# Patient Record
Sex: Male | Born: 1940
Health system: Southern US, Community
[De-identification: ages and names within clinical notes are randomized; demographics above are authoritative.]

## PROBLEM LIST (undated history)

## (undated) DIAGNOSIS — E119 Type 2 diabetes mellitus without complications: Secondary | ICD-10-CM

## (undated) DIAGNOSIS — E785 Hyperlipidemia, unspecified: Secondary | ICD-10-CM

## (undated) DIAGNOSIS — I10 Essential (primary) hypertension: Secondary | ICD-10-CM

## (undated) DIAGNOSIS — I2510 Atherosclerotic heart disease of native coronary artery without angina pectoris: Secondary | ICD-10-CM

## (undated) DIAGNOSIS — M1990 Unspecified osteoarthritis, unspecified site: Secondary | ICD-10-CM

## (undated) DIAGNOSIS — K219 Gastro-esophageal reflux disease without esophagitis: Secondary | ICD-10-CM

## (undated) DIAGNOSIS — N189 Chronic kidney disease, unspecified: Secondary | ICD-10-CM

## (undated) DIAGNOSIS — T7840XA Allergy, unspecified, initial encounter: Secondary | ICD-10-CM

## (undated) HISTORY — DX: Allergy, unspecified, initial encounter: T78.40XA

## (undated) HISTORY — PX: WISDOM TOOTH EXTRACTION: SHX21

## (undated) HISTORY — DX: Essential (primary) hypertension: I10

## (undated) HISTORY — DX: Chronic kidney disease, unspecified: N18.9

## (undated) HISTORY — PX: CORONARY ARTERY BYPASS GRAFT: SHX141

## (undated) HISTORY — DX: Type 2 diabetes mellitus without complications: E11.9

## (undated) HISTORY — DX: Atherosclerotic heart disease of native coronary artery without angina pectoris: I25.10

## (undated) HISTORY — DX: Hyperlipidemia, unspecified: E78.5

---

## 2002-09-03 HISTORY — PX: BOWEL RESECTION: SHX1257

## 2009-09-03 HISTORY — PX: FINGER AMPUTATION: SHX636

## 2010-06-17 ENCOUNTER — Emergency Department: Payer: Self-pay | Admitting: Unknown Physician Specialty

## 2010-06-28 ENCOUNTER — Ambulatory Visit: Payer: Self-pay | Admitting: General Practice

## 2010-06-30 ENCOUNTER — Ambulatory Visit: Payer: Self-pay | Admitting: General Practice

## 2010-07-03 LAB — PATHOLOGY REPORT

## 2010-07-18 ENCOUNTER — Emergency Department: Payer: Self-pay | Admitting: Emergency Medicine

## 2010-11-06 ENCOUNTER — Ambulatory Visit: Payer: Self-pay | Admitting: Family Medicine

## 2012-06-19 ENCOUNTER — Ambulatory Visit: Payer: Self-pay | Admitting: Family Medicine

## 2012-12-11 DIAGNOSIS — S8263XA Displaced fracture of lateral malleolus of unspecified fibula, initial encounter for closed fracture: Secondary | ICD-10-CM | POA: Insufficient documentation

## 2012-12-17 DIAGNOSIS — H5034 Intermittent alternating exotropia: Secondary | ICD-10-CM | POA: Insufficient documentation

## 2012-12-17 DIAGNOSIS — D3130 Benign neoplasm of unspecified choroid: Secondary | ICD-10-CM | POA: Insufficient documentation

## 2012-12-17 DIAGNOSIS — H40003 Preglaucoma, unspecified, bilateral: Secondary | ICD-10-CM | POA: Insufficient documentation

## 2012-12-17 DIAGNOSIS — S0232XA Fracture of orbital floor, left side, initial encounter for closed fracture: Secondary | ICD-10-CM | POA: Insufficient documentation

## 2012-12-17 DIAGNOSIS — S0230XA Fracture of orbital floor, unspecified side, initial encounter for closed fracture: Secondary | ICD-10-CM | POA: Insufficient documentation

## 2012-12-17 DIAGNOSIS — H40009 Preglaucoma, unspecified, unspecified eye: Secondary | ICD-10-CM | POA: Insufficient documentation

## 2012-12-23 DIAGNOSIS — S2249XA Multiple fractures of ribs, unspecified side, initial encounter for closed fracture: Secondary | ICD-10-CM | POA: Insufficient documentation

## 2013-08-21 ENCOUNTER — Ambulatory Visit: Payer: Self-pay | Admitting: Gastroenterology

## 2014-03-04 DIAGNOSIS — R011 Cardiac murmur, unspecified: Secondary | ICD-10-CM | POA: Insufficient documentation

## 2014-03-04 DIAGNOSIS — N189 Chronic kidney disease, unspecified: Secondary | ICD-10-CM | POA: Insufficient documentation

## 2014-10-07 DIAGNOSIS — E876 Hypokalemia: Secondary | ICD-10-CM | POA: Diagnosis not present

## 2014-10-07 DIAGNOSIS — R600 Localized edema: Secondary | ICD-10-CM | POA: Diagnosis not present

## 2014-10-07 DIAGNOSIS — N183 Chronic kidney disease, stage 3 (moderate): Secondary | ICD-10-CM | POA: Diagnosis not present

## 2014-10-07 DIAGNOSIS — I10 Essential (primary) hypertension: Secondary | ICD-10-CM | POA: Diagnosis not present

## 2015-01-12 DIAGNOSIS — H524 Presbyopia: Secondary | ICD-10-CM | POA: Diagnosis not present

## 2015-01-12 DIAGNOSIS — H5210 Myopia, unspecified eye: Secondary | ICD-10-CM | POA: Diagnosis not present

## 2015-02-04 ENCOUNTER — Ambulatory Visit (INDEPENDENT_AMBULATORY_CARE_PROVIDER_SITE_OTHER): Payer: Commercial Managed Care - HMO | Admitting: Unknown Physician Specialty

## 2015-02-04 ENCOUNTER — Encounter: Payer: Self-pay | Admitting: Unknown Physician Specialty

## 2015-02-04 VITALS — BP 126/78 | HR 71 | Temp 98.4°F | Wt 206.0 lb

## 2015-02-04 DIAGNOSIS — E782 Mixed hyperlipidemia: Secondary | ICD-10-CM | POA: Diagnosis not present

## 2015-02-04 DIAGNOSIS — I10 Essential (primary) hypertension: Secondary | ICD-10-CM | POA: Diagnosis not present

## 2015-02-04 DIAGNOSIS — R05 Cough: Secondary | ICD-10-CM

## 2015-02-04 DIAGNOSIS — R059 Cough, unspecified: Secondary | ICD-10-CM

## 2015-02-04 DIAGNOSIS — J309 Allergic rhinitis, unspecified: Secondary | ICD-10-CM

## 2015-02-04 DIAGNOSIS — I2510 Atherosclerotic heart disease of native coronary artery without angina pectoris: Secondary | ICD-10-CM | POA: Diagnosis not present

## 2015-02-04 MED ORDER — BENZONATATE 100 MG PO CAPS: 100.0000 mg | ORAL_CAPSULE | Freq: Two times a day (BID) | ORAL | Status: DC | PRN

## 2015-02-04 MED ORDER — FLUTICASONE PROPIONATE 50 MCG/ACT NA SUSP: 2.0000 | Freq: Every day | NASAL | Status: DC

## 2015-02-04 NOTE — Progress Notes (Signed)
Name: Ryan Kirby   MRN: [id number]    DOB: 31-Oct-1940   Date:02/04/2015       Progress Note  Subjective  Chief Complaint  Chief Complaint  Patient presents with  . Cough    Making chest hurt  . URI  . Sinus drainage   Symptoms have been going on for about a day.  Starting after mowing the lawn and "running the weed eater" Taking Coriciden C. Cough This is a new problem. The current episode started yesterday. The problem has been unchanged. The problem occurs constantly. The cough is non-productive. Associated symptoms include chest pain, nasal congestion and rhinorrhea. Pertinent negatives include no ear pain, fever or shortness of breath. Nothing aggravates the symptoms. The treatment provided no relief.  URI  Associated symptoms include chest pain, coughing and rhinorrhea. Pertinent negatives include no ear pain.     No problem-specific assessment & plan notes found for this encounter.   Past Medical History  Diagnosis Date  . Diabetes mellitus without complication   . Hypertension   . Hyperlipidemia   . Chronic kidney disease   . Allergy   . CAD (coronary artery disease)     History  Substance Use Topics  . Smoking status: Never Smoker   . Smokeless tobacco: Never Used  . Alcohol Use: No     Current outpatient prescriptions:  .  amLODipine (NORVASC) 10 MG tablet, Take 10 mg by mouth daily., Disp: , Rfl:  .  benazepril-hydrochlorthiazide (LOTENSIN HCT) 20-12.5 MG per tablet, Take 1 tablet by mouth daily., Disp: , Rfl:  .  benzonatate (TESSALON) 100 MG capsule, Take 1 capsule (100 mg total) by mouth 2 (two) times daily as needed for cough., Disp: 20 capsule, Rfl: 0 .  carvedilol (COREG) 25 MG tablet, Take 50 mg by mouth 2 (two) times daily with a meal., Disp: , Rfl:  .  cloNIDine (CATAPRES) 0.3 MG tablet, Take 0.3 mg by mouth 2 (two) times daily., Disp: , Rfl:  .  doxazosin (CARDURA) 2 MG tablet, Take 2 mg by mouth at bedtime., Disp: , Rfl:  .  fluticasone (FLONASE)  50 MCG/ACT nasal spray, Place 2 sprays into both nostrils daily., Disp: 16 g, Rfl: 6 .  furosemide (LASIX) 40 MG tablet, Take 40 mg by mouth., Disp: , Rfl:  .  glyBURIDE (DIABETA) 5 MG tablet, Take 5 mg by mouth daily with breakfast., Disp: , Rfl:  .  metFORMIN (GLUCOPHAGE) 500 MG tablet, Take 500 mg by mouth 2 (two) times daily with a meal., Disp: , Rfl:  .  potassium chloride (KLOR-CON) 20 MEQ packet, Take by mouth 2 (two) times daily., Disp: , Rfl:  .  potassium chloride SA (K-DUR,KLOR-CON) 20 MEQ tablet, Take 20 mEq by mouth daily., Disp: , Rfl:  .  pravastatin (PRAVACHOL) 40 MG tablet, Take 40 mg by mouth daily., Disp: , Rfl:  .  triamcinolone (NASACORT) 55 MCG/ACT AERO nasal inhaler, Place 2 sprays into the nose daily., Disp: , Rfl:   Allergies  Allergen Reactions  . Oxycontin [Oxycodone]     Review of Systems  Constitutional: Negative for fever.  HENT: Positive for rhinorrhea. Negative for ear pain.   Respiratory: Positive for cough. Negative for shortness of breath.   Cardiovascular: Positive for chest pain.      Objective  Filed Vitals:   02/04/15 1026  BP: 126/78  Pulse: 71  Temp: 98.4 F (36.9 C)  TempSrc: Oral  Weight: 206 lb (93.441 kg)  SpO2: 97%  Physical Exam  Constitutional: He is well-developed, well-nourished, and in no distress.  HENT:  Head: Atraumatic.  Right Ear: Tympanic membrane, external ear and ear canal normal.  Left Ear: Tympanic membrane, external ear and ear canal normal.  Nose: Mucosal edema, rhinorrhea and sinus tenderness present. No nasal deformity or septal deviation. Right sinus exhibits no maxillary sinus tenderness and no frontal sinus tenderness. Left sinus exhibits no maxillary sinus tenderness and no frontal sinus tenderness.  Neck: Normal range of motion.  Cardiovascular: Normal rate and regular rhythm.   Pulmonary/Chest: Effort normal and breath sounds normal.  Psychiatric: Mood, affect and judgment normal.        No  results found for this or any previous visit (from the past 2160 hour(s)).   Assessment & Plan  Problem List Items Addressed This Visit    None    Visit Diagnoses    Allergic rhinitis, unspecified allergic rhinitis type    -  Primary    Will rx flonase 2 squirts each nostril QD.      Relevant Medications    fluticasone (FLONASE) 50 MCG/ACT nasal spray    Cough        Tessalon Perles for cough    Relevant Medications    benzonatate (TESSALON) 100 MG capsule

## 2015-02-04 NOTE — Patient Instructions (Addendum)
Allergic Rhinitis Allergic rhinitis is when the mucous membranes in the nose respond to allergens. Allergens are particles in the air that cause your body to have an allergic reaction. This causes you to release allergic antibodies. Through a chain of events, these eventually cause you to release histamine into the blood stream. Although meant to protect the body, it is this release of histamine that causes your discomfort, such as frequent sneezing, congestion, and an itchy, runny nose.  CAUSES  Seasonal allergic rhinitis (hay fever) is caused by pollen allergens that may come from grasses, trees, and weeds. Year-round allergic rhinitis (perennial allergic rhinitis) is caused by allergens such as house dust mites, pet dander, and mold spores.  SYMPTOMS   Nasal stuffiness (congestion).  Itchy, runny nose with sneezing and tearing of the eyes. DIAGNOSIS  Your health care provider can help you determine the allergen or allergens that trigger your symptoms. If you and your health care provider are unable to determine the allergen, skin or blood testing may be used. TREATMENT  Allergic rhinitis does not have a cure, but it can be controlled by:  Medicines and allergy shots (immunotherapy).  Avoiding the allergen. Hay fever may often be treated with antihistamines in pill or nasal spray forms. Antihistamines block the effects of histamine. There are over-the-counter medicines that may help with nasal congestion and swelling around the eyes. Check with your health care provider before taking or giving this medicine.  If avoiding the allergen or the medicine prescribed do not work, there are many new medicines your health care provider can prescribe. Stronger medicine may be used if initial measures are ineffective. Desensitizing injections can be used if medicine and avoidance does not work. Desensitization is when a patient is given ongoing shots until the body becomes less sensitive to the allergen.  Make sure you follow up with your health care provider if problems continue. HOME CARE INSTRUCTIONS It is not possible to completely avoid allergens, but you can reduce your symptoms by taking steps to limit your exposure to them. It helps to know exactly what you are allergic to so that you can avoid your specific triggers. SEEK MEDICAL CARE IF:   You have a fever.  You develop a cough that does not stop easily (persistent).  You have shortness of breath.  You start wheezing.  Symptoms interfere with normal daily activities. Document Released: 05/15/2001 Document Revised: 08/25/2013 Document Reviewed: 04/27/2013 [facility] Patient Information 2015 [place], [place]. This information is not intended to replace advice given to you by your health care provider. Make sure you discuss any questions you have with your health care provider.   May use over the counter Allegra, Zyrtec or Clariton in additional to the nasal spray

## 2015-02-07 DIAGNOSIS — I2510 Atherosclerotic heart disease of native coronary artery without angina pectoris: Secondary | ICD-10-CM | POA: Diagnosis not present

## 2015-02-16 ENCOUNTER — Telehealth: Payer: Self-pay | Admitting: Family Medicine

## 2015-02-16 NOTE — Telephone Encounter (Signed)
Paperwork up front.  Patient not on insulin, no letter needed, and Cardiology must take care of other requirements

## 2015-02-16 NOTE — Telephone Encounter (Signed)
Pt came in and had a paper about diabetes mellitus and cardiovascular disease and would like a call back. He has been here twice and says that nobody calls him back and so he stopped by again. i have a copy of the paper up front but was unsure how to scan it in so that you could see it. Pt would like a call back at [phone].

## 2015-02-18 ENCOUNTER — Telehealth: Payer: Self-pay | Admitting: Family Medicine

## 2015-02-18 DIAGNOSIS — E876 Hypokalemia: Secondary | ICD-10-CM | POA: Diagnosis not present

## 2015-02-18 DIAGNOSIS — R600 Localized edema: Secondary | ICD-10-CM | POA: Diagnosis not present

## 2015-02-18 DIAGNOSIS — D631 Anemia in chronic kidney disease: Secondary | ICD-10-CM | POA: Diagnosis not present

## 2015-02-18 DIAGNOSIS — I10 Essential (primary) hypertension: Secondary | ICD-10-CM | POA: Diagnosis not present

## 2015-02-18 DIAGNOSIS — N182 Chronic kidney disease, stage 2 (mild): Secondary | ICD-10-CM | POA: Diagnosis not present

## 2015-02-18 DIAGNOSIS — N183 Chronic kidney disease, stage 3 (moderate): Secondary | ICD-10-CM | POA: Diagnosis not present

## 2015-02-18 NOTE — Telephone Encounter (Signed)
Pt came by again today insisting that he needs things from [place] in order for him to be able to drive. i spoke with him yesterday and explained that nancy said that he would need to talk to his cardiovascular doctor in order to get what he needed and he has came in today and now says that he need his physician to verify that he hasnt had any severe hypoglycemic reactions in the last 12 months as well as the date of his last a1c level which must be within the last 6 months. He would like a call back at [phone]

## 2015-02-21 ENCOUNTER — Other Ambulatory Visit: Payer: Self-pay | Admitting: Family Medicine

## 2015-02-22 NOTE — Telephone Encounter (Signed)
Can you write a letter with this info?

## 2015-02-22 NOTE — Telephone Encounter (Signed)
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

## 2015-02-23 ENCOUNTER — Encounter: Payer: Self-pay | Admitting: Family Medicine

## 2015-02-23 ENCOUNTER — Ambulatory Visit (INDEPENDENT_AMBULATORY_CARE_PROVIDER_SITE_OTHER): Payer: Commercial Managed Care - HMO | Admitting: Family Medicine

## 2015-02-23 VITALS — BP 177/79 | HR 75 | Temp 98.4°F | Ht 69.8 in | Wt 205.0 lb

## 2015-02-23 DIAGNOSIS — E1159 Type 2 diabetes mellitus with other circulatory complications: Secondary | ICD-10-CM | POA: Insufficient documentation

## 2015-02-23 DIAGNOSIS — I2510 Atherosclerotic heart disease of native coronary artery without angina pectoris: Secondary | ICD-10-CM | POA: Diagnosis not present

## 2015-02-23 DIAGNOSIS — E785 Hyperlipidemia, unspecified: Secondary | ICD-10-CM

## 2015-02-23 DIAGNOSIS — I129 Hypertensive chronic kidney disease with stage 1 through stage 4 chronic kidney disease, or unspecified chronic kidney disease: Secondary | ICD-10-CM | POA: Insufficient documentation

## 2015-02-23 DIAGNOSIS — E119 Type 2 diabetes mellitus without complications: Secondary | ICD-10-CM | POA: Diagnosis not present

## 2015-02-23 DIAGNOSIS — I2583 Coronary atherosclerosis due to lipid rich plaque: Secondary | ICD-10-CM

## 2015-02-23 DIAGNOSIS — I10 Essential (primary) hypertension: Secondary | ICD-10-CM | POA: Diagnosis not present

## 2015-02-23 DIAGNOSIS — I152 Hypertension secondary to endocrine disorders: Secondary | ICD-10-CM | POA: Insufficient documentation

## 2015-02-23 DIAGNOSIS — E1169 Type 2 diabetes mellitus with other specified complication: Secondary | ICD-10-CM | POA: Insufficient documentation

## 2015-02-23 NOTE — Assessment & Plan Note (Addendum)
Pt no cardiac sx doing well Good report from cardiology no concerns

## 2015-02-23 NOTE — Assessment & Plan Note (Signed)
The current medical regimen is effective;  continue present plan and medications.  

## 2015-02-23 NOTE — Assessment & Plan Note (Signed)
HgbA1C 7.5 today  No hypoglycemia spells Pt will do better with diet and exercise for a little better control

## 2015-02-23 NOTE — Progress Notes (Signed)
   BP 177/79 mmHg  Pulse 75  Temp(Src) 98.4 F (36.9 C)  Ht 5' 9.8" (1.773 m)  Wt 205 lb (92.987 kg)  BMI 29.58 kg/m2  SpO2 97%   Subjective:    Patient ID: Ryan Kirby, male    DOB: 05/28/1941, 74 y.o.   MRN: [id number]  HPI: Ryan Kirby is a 74 y.o. male  Chief Complaint  Patient presents with  . Diabetes  . Hyperlipidemia  . Hypertension   pt doing well Taking meds every day with no prob;lems No problems from last visit No hypoglycemia problems  Relevant past medical, surgical, family and social history reviewed and updated as indicated. Interim medical history since our last visit reviewed. Allergies and medications reviewed and updated.  Review of Systems  Constitutional: Negative.   Respiratory: Negative.   Cardiovascular: Negative.     Per HPI unless specifically indicated above     Objective:    BP 177/79 mmHg  Pulse 75  Temp(Src) 98.4 F (36.9 C)  Ht 5' 9.8" (1.773 m)  Wt 205 lb (92.987 kg)  BMI 29.58 kg/m2  SpO2 97%  Wt Readings from Last 3 Encounters:  02/23/15 205 lb (92.987 kg)  07/15/14 207 lb (93.895 kg)  02/04/15 206 lb (93.441 kg)    Physical Exam  Constitutional: He is oriented to person, place, and time. He appears well-developed and well-nourished. No distress.  HENT:  Head: Normocephalic and atraumatic.  Right Ear: Hearing normal.  Left Ear: Hearing normal.  Nose: Nose normal.  Eyes: Conjunctivae and lids are normal. Right eye exhibits no discharge. Left eye exhibits no discharge. No scleral icterus.  Cardiovascular: Normal rate, regular rhythm and normal heart sounds.   Pulmonary/Chest: Effort normal and breath sounds normal. No respiratory distress.  Musculoskeletal: Normal range of motion.  Neurological: He is alert and oriented to person, place, and time.  Skin: Skin is intact. No rash noted.  Psychiatric: He has a normal mood and affect. His speech is normal and behavior is normal. Judgment and thought content normal.  Cognition and memory are normal.        Assessment & Plan:   Problem List Items Addressed This Visit      Cardiovascular and Mediastinum   Hypertension - Primary    The current medical regimen is effective;  continue present plan and medications.       Relevant Medications   potassium chloride SA (K-DUR,KLOR-CON) 20 MEQ tablet   Other Relevant Orders   Basic metabolic panel   CAD (coronary artery disease)    Pt no cardiac sx doing well Good report from cardiology no concerns      Relevant Orders   Basic metabolic panel   Lipid panel     Endocrine   Diabetes mellitus without complication    [id number] 7.5 today  No hypoglycemia spells Pt will do better with diet and exercise for a little better control      Relevant Orders   Basic metabolic panel   Lipid panel   Hemoglobin A1c     Other   Hyperlipidemia    The current medical regimen is effective;  continue present plan and medications.       Relevant Orders   AST   ALT   Lipid panel       Follow up plan: Return in about 3 months (around 05/26/2015) for DM check a1c.

## 2015-02-24 LAB — ALT: ALT: 12 IU/L (ref 0–44)

## 2015-02-24 LAB — BASIC METABOLIC PANEL
BUN/Creatinine Ratio: 14 (ref 10–22)
BUN: 18 mg/dL (ref 8–27)
CALCIUM: 10.1 mg/dL (ref 8.6–10.2)
CO2: 23 mmol/L (ref 18–29)
CREATININE: 1.33 mg/dL — AB (ref 0.76–1.27)
Chloride: 99 mmol/L (ref 97–108)
GFR, EST AFRICAN AMERICAN: 61 mL/min/{1.73_m2} (ref >59)
GFR, EST NON AFRICAN AMERICAN: 53 mL/min/{1.73_m2} — AB (ref >59)
Glucose: 99 mg/dL (ref 65–99)
POTASSIUM: 3.7 mmol/L (ref 3.5–5.2)
Sodium: 139 mmol/L (ref 134–144)

## 2015-02-24 LAB — LIPID PANEL
CHOL/HDL RATIO: 3.4 ratio (ref 0.0–5.0)
Cholesterol, Total: 160 mg/dL (ref 100–199)
HDL: 47 mg/dL (ref >39)
LDL Calculated: 86 mg/dL (ref 0–99)
Triglycerides: 135 mg/dL (ref 0–149)
VLDL Cholesterol Cal: 27 mg/dL (ref 5–40)

## 2015-02-24 LAB — LIPID PANEL PICCOLO, WAIVED
Chol/HDL Ratio Piccolo,Waive: 3.2 mg/dL
Cholesterol Piccolo, Waived: 156 mg/dL (ref <200)
HDL Chol Piccolo, Waived: 48 mg/dL — ABNORMAL LOW (ref >59)
LDL Chol Calc Piccolo Waived: 78 mg/dL (ref <100)
Triglycerides Piccolo,Waived: 146 mg/dL (ref <150)
VLDL CHOL CALC PICCOLO,WAIVE: 29 mg/dL (ref <30)

## 2015-02-24 LAB — AST: AST: 23 IU/L (ref 0–40)

## 2015-02-24 LAB — ALT (SGPT) PICCOLO, WAIVED: ALT (SGPT) Piccolo, Waived: 19 U/L (ref 10–47)

## 2015-02-24 LAB — BAYER DCA HB A1C WAIVED: HB A1C: 7.5 % — AB (ref <7.0)

## 2015-02-24 LAB — AST (SGOT) PICCOLO, WAIVED: AST (SGOT) PICCOLO, WAIVED: 28 U/L (ref 11–38)

## 2015-03-21 DIAGNOSIS — M5431 Sciatica, right side: Secondary | ICD-10-CM | POA: Diagnosis not present

## 2015-03-21 DIAGNOSIS — M9903 Segmental and somatic dysfunction of lumbar region: Secondary | ICD-10-CM | POA: Diagnosis not present

## 2015-03-21 DIAGNOSIS — M9905 Segmental and somatic dysfunction of pelvic region: Secondary | ICD-10-CM | POA: Diagnosis not present

## 2015-03-21 DIAGNOSIS — M955 Acquired deformity of pelvis: Secondary | ICD-10-CM | POA: Diagnosis not present

## 2015-03-22 DIAGNOSIS — M5431 Sciatica, right side: Secondary | ICD-10-CM | POA: Diagnosis not present

## 2015-03-22 DIAGNOSIS — M955 Acquired deformity of pelvis: Secondary | ICD-10-CM | POA: Diagnosis not present

## 2015-03-22 DIAGNOSIS — M9905 Segmental and somatic dysfunction of pelvic region: Secondary | ICD-10-CM | POA: Diagnosis not present

## 2015-03-22 DIAGNOSIS — M9903 Segmental and somatic dysfunction of lumbar region: Secondary | ICD-10-CM | POA: Diagnosis not present

## 2015-03-23 DIAGNOSIS — M5431 Sciatica, right side: Secondary | ICD-10-CM | POA: Diagnosis not present

## 2015-03-23 DIAGNOSIS — M955 Acquired deformity of pelvis: Secondary | ICD-10-CM | POA: Diagnosis not present

## 2015-03-23 DIAGNOSIS — M9903 Segmental and somatic dysfunction of lumbar region: Secondary | ICD-10-CM | POA: Diagnosis not present

## 2015-03-23 DIAGNOSIS — M9905 Segmental and somatic dysfunction of pelvic region: Secondary | ICD-10-CM | POA: Diagnosis not present

## 2015-03-25 ENCOUNTER — Telehealth: Payer: Self-pay | Admitting: Family Medicine

## 2015-03-25 DIAGNOSIS — M5431 Sciatica, right side: Secondary | ICD-10-CM | POA: Diagnosis not present

## 2015-03-25 DIAGNOSIS — M9903 Segmental and somatic dysfunction of lumbar region: Secondary | ICD-10-CM | POA: Diagnosis not present

## 2015-03-25 DIAGNOSIS — M9905 Segmental and somatic dysfunction of pelvic region: Secondary | ICD-10-CM | POA: Diagnosis not present

## 2015-03-25 DIAGNOSIS — M955 Acquired deformity of pelvis: Secondary | ICD-10-CM | POA: Diagnosis not present

## 2015-03-25 NOTE — Telephone Encounter (Signed)
Do you need to put this as a referral through [facility]?

## 2015-03-25 NOTE — Telephone Encounter (Addendum)
Pt came in and said he needed prior authorization to go to [facility] in [place]. He said the lady there told him that he'd need it for his [employer]. Pt said [facility] had left a message for tiffany but hadn't heard anything.  Pt is not requesting a call back he would just like it done. The number to the office is [phone](not sure if you need it)

## 2015-03-28 DIAGNOSIS — M9905 Segmental and somatic dysfunction of pelvic region: Secondary | ICD-10-CM | POA: Diagnosis not present

## 2015-03-28 DIAGNOSIS — M955 Acquired deformity of pelvis: Secondary | ICD-10-CM | POA: Diagnosis not present

## 2015-03-28 DIAGNOSIS — M9903 Segmental and somatic dysfunction of lumbar region: Secondary | ICD-10-CM | POA: Diagnosis not present

## 2015-03-28 DIAGNOSIS — M5431 Sciatica, right side: Secondary | ICD-10-CM | POA: Diagnosis not present

## 2015-03-28 NOTE — Telephone Encounter (Signed)
It would be an outgoing referral to Dr Teressa Senter [facility], I am not certain how to exactly enter this information from a provider stand point. The provider has to enter this information

## 2015-03-28 NOTE — Telephone Encounter (Signed)
Referral to Dr Francisca December is OK

## 2015-03-29 NOTE — Telephone Encounter (Signed)
Referral complete. T Foxx tried to call patient to ok him calling and scheduling appointment, no answer, she called Dr. Fabio Bering office to inform of approval

## 2015-03-30 DIAGNOSIS — M9905 Segmental and somatic dysfunction of pelvic region: Secondary | ICD-10-CM | POA: Diagnosis not present

## 2015-03-30 DIAGNOSIS — M5431 Sciatica, right side: Secondary | ICD-10-CM | POA: Diagnosis not present

## 2015-03-30 DIAGNOSIS — M9903 Segmental and somatic dysfunction of lumbar region: Secondary | ICD-10-CM | POA: Diagnosis not present

## 2015-03-30 DIAGNOSIS — M955 Acquired deformity of pelvis: Secondary | ICD-10-CM | POA: Diagnosis not present

## 2015-04-01 DIAGNOSIS — M5431 Sciatica, right side: Secondary | ICD-10-CM | POA: Diagnosis not present

## 2015-04-01 DIAGNOSIS — M9903 Segmental and somatic dysfunction of lumbar region: Secondary | ICD-10-CM | POA: Diagnosis not present

## 2015-04-01 DIAGNOSIS — M9905 Segmental and somatic dysfunction of pelvic region: Secondary | ICD-10-CM | POA: Diagnosis not present

## 2015-04-01 DIAGNOSIS — M955 Acquired deformity of pelvis: Secondary | ICD-10-CM | POA: Diagnosis not present

## 2015-04-04 DIAGNOSIS — M5431 Sciatica, right side: Secondary | ICD-10-CM | POA: Diagnosis not present

## 2015-04-04 DIAGNOSIS — M9903 Segmental and somatic dysfunction of lumbar region: Secondary | ICD-10-CM | POA: Diagnosis not present

## 2015-04-04 DIAGNOSIS — M9905 Segmental and somatic dysfunction of pelvic region: Secondary | ICD-10-CM | POA: Diagnosis not present

## 2015-04-04 DIAGNOSIS — M955 Acquired deformity of pelvis: Secondary | ICD-10-CM | POA: Diagnosis not present

## 2015-04-06 ENCOUNTER — Telehealth: Payer: Self-pay | Admitting: Family Medicine

## 2015-04-06 DIAGNOSIS — M9905 Segmental and somatic dysfunction of pelvic region: Secondary | ICD-10-CM | POA: Diagnosis not present

## 2015-04-06 DIAGNOSIS — M5431 Sciatica, right side: Secondary | ICD-10-CM | POA: Diagnosis not present

## 2015-04-06 DIAGNOSIS — M955 Acquired deformity of pelvis: Secondary | ICD-10-CM | POA: Diagnosis not present

## 2015-04-06 DIAGNOSIS — M9903 Segmental and somatic dysfunction of lumbar region: Secondary | ICD-10-CM | POA: Diagnosis not present

## 2015-04-06 MED ORDER — FUROSEMIDE 40 MG PO TABS: 40.0000 mg | ORAL_TABLET | Freq: Every day | ORAL | Status: DC

## 2015-04-06 NOTE — Telephone Encounter (Signed)
Reviewed notes.  Pt is being managed and monitored for this and will refill

## 2015-04-06 NOTE — Telephone Encounter (Signed)
Pt came in and needed a refill on his furisemide 40 mg sent to [facility]. Pt stated he got everything but this one and he just wanted to make sure he had them before he runs out. He has about 9-10 pills left.

## 2015-04-06 NOTE — Telephone Encounter (Signed)
Routing to provider  

## 2015-04-08 DIAGNOSIS — M9905 Segmental and somatic dysfunction of pelvic region: Secondary | ICD-10-CM | POA: Diagnosis not present

## 2015-04-08 DIAGNOSIS — M5431 Sciatica, right side: Secondary | ICD-10-CM | POA: Diagnosis not present

## 2015-04-08 DIAGNOSIS — M9903 Segmental and somatic dysfunction of lumbar region: Secondary | ICD-10-CM | POA: Diagnosis not present

## 2015-04-08 DIAGNOSIS — M955 Acquired deformity of pelvis: Secondary | ICD-10-CM | POA: Diagnosis not present

## 2015-04-11 DIAGNOSIS — M9905 Segmental and somatic dysfunction of pelvic region: Secondary | ICD-10-CM | POA: Diagnosis not present

## 2015-04-11 DIAGNOSIS — M9903 Segmental and somatic dysfunction of lumbar region: Secondary | ICD-10-CM | POA: Diagnosis not present

## 2015-04-11 DIAGNOSIS — M5431 Sciatica, right side: Secondary | ICD-10-CM | POA: Diagnosis not present

## 2015-04-11 DIAGNOSIS — M955 Acquired deformity of pelvis: Secondary | ICD-10-CM | POA: Diagnosis not present

## 2015-04-13 DIAGNOSIS — M955 Acquired deformity of pelvis: Secondary | ICD-10-CM | POA: Diagnosis not present

## 2015-04-13 DIAGNOSIS — M9903 Segmental and somatic dysfunction of lumbar region: Secondary | ICD-10-CM | POA: Diagnosis not present

## 2015-04-13 DIAGNOSIS — M5431 Sciatica, right side: Secondary | ICD-10-CM | POA: Diagnosis not present

## 2015-04-13 DIAGNOSIS — M9905 Segmental and somatic dysfunction of pelvic region: Secondary | ICD-10-CM | POA: Diagnosis not present

## 2015-04-15 DIAGNOSIS — M9903 Segmental and somatic dysfunction of lumbar region: Secondary | ICD-10-CM | POA: Diagnosis not present

## 2015-04-15 DIAGNOSIS — M9905 Segmental and somatic dysfunction of pelvic region: Secondary | ICD-10-CM | POA: Diagnosis not present

## 2015-04-15 DIAGNOSIS — M5431 Sciatica, right side: Secondary | ICD-10-CM | POA: Diagnosis not present

## 2015-04-15 DIAGNOSIS — M955 Acquired deformity of pelvis: Secondary | ICD-10-CM | POA: Diagnosis not present

## 2015-04-18 DIAGNOSIS — M9905 Segmental and somatic dysfunction of pelvic region: Secondary | ICD-10-CM | POA: Diagnosis not present

## 2015-04-18 DIAGNOSIS — M955 Acquired deformity of pelvis: Secondary | ICD-10-CM | POA: Diagnosis not present

## 2015-04-18 DIAGNOSIS — M9903 Segmental and somatic dysfunction of lumbar region: Secondary | ICD-10-CM | POA: Diagnosis not present

## 2015-04-18 DIAGNOSIS — M5431 Sciatica, right side: Secondary | ICD-10-CM | POA: Diagnosis not present

## 2015-04-20 DIAGNOSIS — M9903 Segmental and somatic dysfunction of lumbar region: Secondary | ICD-10-CM | POA: Diagnosis not present

## 2015-04-20 DIAGNOSIS — M5431 Sciatica, right side: Secondary | ICD-10-CM | POA: Diagnosis not present

## 2015-04-20 DIAGNOSIS — M9905 Segmental and somatic dysfunction of pelvic region: Secondary | ICD-10-CM | POA: Diagnosis not present

## 2015-04-20 DIAGNOSIS — M955 Acquired deformity of pelvis: Secondary | ICD-10-CM | POA: Diagnosis not present

## 2015-04-25 DIAGNOSIS — M955 Acquired deformity of pelvis: Secondary | ICD-10-CM | POA: Diagnosis not present

## 2015-04-25 DIAGNOSIS — M9905 Segmental and somatic dysfunction of pelvic region: Secondary | ICD-10-CM | POA: Diagnosis not present

## 2015-04-25 DIAGNOSIS — M5431 Sciatica, right side: Secondary | ICD-10-CM | POA: Diagnosis not present

## 2015-04-25 DIAGNOSIS — M9903 Segmental and somatic dysfunction of lumbar region: Secondary | ICD-10-CM | POA: Diagnosis not present

## 2015-04-29 DIAGNOSIS — M955 Acquired deformity of pelvis: Secondary | ICD-10-CM | POA: Diagnosis not present

## 2015-04-29 DIAGNOSIS — M9903 Segmental and somatic dysfunction of lumbar region: Secondary | ICD-10-CM | POA: Diagnosis not present

## 2015-04-29 DIAGNOSIS — M5431 Sciatica, right side: Secondary | ICD-10-CM | POA: Diagnosis not present

## 2015-04-29 DIAGNOSIS — M9905 Segmental and somatic dysfunction of pelvic region: Secondary | ICD-10-CM | POA: Diagnosis not present

## 2015-05-02 DIAGNOSIS — M9905 Segmental and somatic dysfunction of pelvic region: Secondary | ICD-10-CM | POA: Diagnosis not present

## 2015-05-02 DIAGNOSIS — M955 Acquired deformity of pelvis: Secondary | ICD-10-CM | POA: Diagnosis not present

## 2015-05-02 DIAGNOSIS — M5431 Sciatica, right side: Secondary | ICD-10-CM | POA: Diagnosis not present

## 2015-05-02 DIAGNOSIS — M9903 Segmental and somatic dysfunction of lumbar region: Secondary | ICD-10-CM | POA: Diagnosis not present

## 2015-05-04 DIAGNOSIS — M9905 Segmental and somatic dysfunction of pelvic region: Secondary | ICD-10-CM | POA: Diagnosis not present

## 2015-05-04 DIAGNOSIS — M9903 Segmental and somatic dysfunction of lumbar region: Secondary | ICD-10-CM | POA: Diagnosis not present

## 2015-05-04 DIAGNOSIS — M5431 Sciatica, right side: Secondary | ICD-10-CM | POA: Diagnosis not present

## 2015-05-04 DIAGNOSIS — M955 Acquired deformity of pelvis: Secondary | ICD-10-CM | POA: Diagnosis not present

## 2015-05-10 DIAGNOSIS — M9903 Segmental and somatic dysfunction of lumbar region: Secondary | ICD-10-CM | POA: Diagnosis not present

## 2015-05-10 DIAGNOSIS — M955 Acquired deformity of pelvis: Secondary | ICD-10-CM | POA: Diagnosis not present

## 2015-05-10 DIAGNOSIS — M5431 Sciatica, right side: Secondary | ICD-10-CM | POA: Diagnosis not present

## 2015-05-10 DIAGNOSIS — M9905 Segmental and somatic dysfunction of pelvic region: Secondary | ICD-10-CM | POA: Diagnosis not present

## 2015-05-12 DIAGNOSIS — M25561 Pain in right knee: Secondary | ICD-10-CM | POA: Diagnosis not present

## 2015-05-12 DIAGNOSIS — M2391 Unspecified internal derangement of right knee: Secondary | ICD-10-CM | POA: Diagnosis not present

## 2015-05-13 DIAGNOSIS — M9905 Segmental and somatic dysfunction of pelvic region: Secondary | ICD-10-CM | POA: Diagnosis not present

## 2015-05-13 DIAGNOSIS — M5431 Sciatica, right side: Secondary | ICD-10-CM | POA: Diagnosis not present

## 2015-05-13 DIAGNOSIS — M9903 Segmental and somatic dysfunction of lumbar region: Secondary | ICD-10-CM | POA: Diagnosis not present

## 2015-05-13 DIAGNOSIS — M955 Acquired deformity of pelvis: Secondary | ICD-10-CM | POA: Diagnosis not present

## 2015-05-17 DIAGNOSIS — M5431 Sciatica, right side: Secondary | ICD-10-CM | POA: Diagnosis not present

## 2015-05-17 DIAGNOSIS — M9903 Segmental and somatic dysfunction of lumbar region: Secondary | ICD-10-CM | POA: Diagnosis not present

## 2015-05-17 DIAGNOSIS — M955 Acquired deformity of pelvis: Secondary | ICD-10-CM | POA: Diagnosis not present

## 2015-05-17 DIAGNOSIS — M9905 Segmental and somatic dysfunction of pelvic region: Secondary | ICD-10-CM | POA: Diagnosis not present

## 2015-05-20 DIAGNOSIS — M5431 Sciatica, right side: Secondary | ICD-10-CM | POA: Diagnosis not present

## 2015-05-20 DIAGNOSIS — M955 Acquired deformity of pelvis: Secondary | ICD-10-CM | POA: Diagnosis not present

## 2015-05-20 DIAGNOSIS — M9903 Segmental and somatic dysfunction of lumbar region: Secondary | ICD-10-CM | POA: Diagnosis not present

## 2015-05-20 DIAGNOSIS — M9905 Segmental and somatic dysfunction of pelvic region: Secondary | ICD-10-CM | POA: Diagnosis not present

## 2015-05-25 DIAGNOSIS — M9903 Segmental and somatic dysfunction of lumbar region: Secondary | ICD-10-CM | POA: Diagnosis not present

## 2015-05-25 DIAGNOSIS — M955 Acquired deformity of pelvis: Secondary | ICD-10-CM | POA: Diagnosis not present

## 2015-05-25 DIAGNOSIS — M9905 Segmental and somatic dysfunction of pelvic region: Secondary | ICD-10-CM | POA: Diagnosis not present

## 2015-05-25 DIAGNOSIS — M5431 Sciatica, right side: Secondary | ICD-10-CM | POA: Diagnosis not present

## 2015-06-02 ENCOUNTER — Ambulatory Visit (INDEPENDENT_AMBULATORY_CARE_PROVIDER_SITE_OTHER): Payer: Commercial Managed Care - HMO | Admitting: Family Medicine

## 2015-06-02 ENCOUNTER — Encounter: Payer: Self-pay | Admitting: Family Medicine

## 2015-06-02 VITALS — BP 137/76 | HR 71 | Temp 98.1°F | Ht 70.25 in | Wt 203.4 lb

## 2015-06-02 DIAGNOSIS — E785 Hyperlipidemia, unspecified: Secondary | ICD-10-CM

## 2015-06-02 DIAGNOSIS — N183 Chronic kidney disease, stage 3 unspecified: Secondary | ICD-10-CM

## 2015-06-02 DIAGNOSIS — E1122 Type 2 diabetes mellitus with diabetic chronic kidney disease: Secondary | ICD-10-CM | POA: Insufficient documentation

## 2015-06-02 DIAGNOSIS — Z23 Encounter for immunization: Secondary | ICD-10-CM

## 2015-06-02 DIAGNOSIS — I10 Essential (primary) hypertension: Secondary | ICD-10-CM

## 2015-06-02 DIAGNOSIS — E119 Type 2 diabetes mellitus without complications: Secondary | ICD-10-CM

## 2015-06-02 LAB — BAYER DCA HB A1C WAIVED: HB A1C: 7.4 % — AB (ref <7.0)

## 2015-06-02 NOTE — Assessment & Plan Note (Signed)
The current medical regimen is effective;  continue present plan and medications.  

## 2015-06-02 NOTE — Addendum Note (Signed)
Addended by: Moss Mc on: 06/02/2015 10:53 AM   Modules accepted: Orders

## 2015-06-02 NOTE — Progress Notes (Signed)
BP 137/76 mmHg  Pulse 71  Temp(Src) 98.1 F (36.7 C)  Ht 5' 10.25" (1.784 m)  Wt 203 lb 6.4 oz (92.262 kg)  BMI 28.99 kg/m2  SpO2 99%   Subjective:    Patient ID: Ryan Kirby, male    DOB: [birth year], 74 y.o.   MRN: [id number]  HPI: Ryan Kirby is a 74 y.o. male  Chief Complaint  Patient presents with  . Diabetes  . Hyperlipidemia  . Hypertension   patient doing well noted low blood sugar spells tolerating medications well no side effects. Takes medications faithfully Blood pressure doing well no complaints Lipids no complaints from medications Taking extra potassium on recommendation of nephrology  Relevant past medical, surgical, family and social history reviewed and updated as indicated. Interim medical history since our last visit reviewed. Allergies and medications reviewed and updated.  Review of Systems  Constitutional: Negative.   Respiratory: Negative.   Cardiovascular: Negative.     Per HPI unless specifically indicated above     Objective:    BP 137/76 mmHg  Pulse 71  Temp(Src) 98.1 F (36.7 C)  Ht 5' 10.25" (1.784 m)  Wt 203 lb 6.4 oz (92.262 kg)  BMI 28.99 kg/m2  SpO2 99%  Wt Readings from Last 3 Encounters:  06/02/15 203 lb 6.4 oz (92.262 kg)  02/23/15 205 lb (92.987 kg)  07/15/14 207 lb (93.895 kg)    Physical Exam  Constitutional: He is oriented to person, place, and time. He appears well-developed and well-nourished. No distress.  HENT:  Head: Normocephalic and atraumatic.  Right Ear: Hearing normal.  Left Ear: Hearing normal.  Nose: Nose normal.  Eyes: Conjunctivae and lids are normal. Right eye exhibits no discharge. Left eye exhibits no discharge. No scleral icterus.  Cardiovascular: Normal rate, regular rhythm and normal heart sounds.   Pulmonary/Chest: Effort normal and breath sounds normal. No respiratory distress.  Musculoskeletal: Normal range of motion.  Neurological: He is alert and oriented to person, place, and time.   Skin: Skin is intact. No rash noted.  Psychiatric: He has a normal mood and affect. His speech is normal and behavior is normal. Judgment and thought content normal. Cognition and memory are normal.    Results for orders placed or performed in visit on 43/32/95  Basic metabolic panel  Result Value Ref Range   Glucose 99 65 - 99 mg/dL   BUN 18 8 - 27 mg/dL   Creatinine, Ser 1.33 (H) 0.76 - 1.27 mg/dL   GFR calc non Af Amer 53 (L) >59 mL/min/1.73   GFR calc Af Amer 61 >59 mL/min/1.73   BUN/Creatinine Ratio 14 10 - 22   Sodium 139 134 - 144 mmol/L   Potassium 3.7 3.5 - 5.2 mmol/L   Chloride 99 97 - 108 mmol/L   CO2 23 18 - 29 mmol/L   Calcium 10.1 8.6 - 10.2 mg/dL  AST  Result Value Ref Range   AST 23 0 - 40 IU/L  ALT  Result Value Ref Range   ALT 12 0 - 44 IU/L  Lipid panel  Result Value Ref Range   Cholesterol, Total 160 100 - 199 mg/dL   Triglycerides 135 0 - 149 mg/dL   HDL 47 >39 mg/dL   VLDL Cholesterol Cal 27 5 - 40 mg/dL   LDL Calculated 86 0 - 99 mg/dL   Chol/HDL Ratio 3.4 0.0 - 5.0 ratio units  Lipid Panel Piccolo, Waived  Result Value Ref Range   Cholesterol Piccolo,  Waived 156 <200 mg/dL   HDL Chol Piccolo, Waived 48 (L) >59 mg/dL   Triglycerides Piccolo,Waived 146 <150 mg/dL   Chol/HDL Ratio Piccolo,Waive 3.2 mg/dL   LDL Chol Calc Piccolo Waived 78 <100 mg/dL   VLDL Chol Calc Piccolo,Waive 29 <30 mg/dL  Bayer DCA Hb A1c Waived  Result Value Ref Range   Bayer DCA Hb A1c Waived 7.5 (H) <7.0 %  ALT (SGPT) Piccolo, Waived  Result Value Ref Range   ALT (SGPT) Piccolo, Waived 19 10 - 47 U/L  AST (SGOT) Piccolo, Waived  Result Value Ref Range   AST (SGOT) Piccolo, Waived 28 11 - 38 U/L      Assessment & Plan:   Problem List Items Addressed This Visit      Cardiovascular and Mediastinum   Hypertension - Primary    The current medical regimen is effective;  continue present plan and medications.         Endocrine   Diabetes mellitus without  complication    The current medical regimen is effective;  continue present plan and medications.       Relevant Orders   Bayer DCA Hb A1c Waived     Genitourinary   Chronic kidney disease    Followed by nephrology      Relevant Orders   Basic metabolic panel     Other   Hyperlipidemia    The current medical regimen is effective;  continue present plan and medications.           Follow up plan: Return in about 3 months (around 09/01/2015) for Physical Exam and a1c.

## 2015-06-02 NOTE — Assessment & Plan Note (Signed)
Followed by nephrology. 

## 2015-06-03 LAB — BASIC METABOLIC PANEL
BUN/Creatinine Ratio: 12 (ref 10–22)
BUN: 16 mg/dL (ref 8–27)
CO2: 24 mmol/L (ref 18–29)
CREATININE: 1.35 mg/dL — AB (ref 0.76–1.27)
Calcium: 10.2 mg/dL (ref 8.6–10.2)
Chloride: 96 mmol/L — ABNORMAL LOW (ref 97–108)
GFR, EST AFRICAN AMERICAN: 60 mL/min/{1.73_m2} (ref >59)
GFR, EST NON AFRICAN AMERICAN: 52 mL/min/{1.73_m2} — AB (ref >59)
Glucose: 120 mg/dL — ABNORMAL HIGH (ref 65–99)
Potassium: 3.8 mmol/L (ref 3.5–5.2)
SODIUM: 138 mmol/L (ref 134–144)

## 2015-06-06 ENCOUNTER — Encounter: Payer: Self-pay | Admitting: Family Medicine

## 2015-06-24 DIAGNOSIS — I10 Essential (primary) hypertension: Secondary | ICD-10-CM | POA: Diagnosis not present

## 2015-06-24 DIAGNOSIS — N182 Chronic kidney disease, stage 2 (mild): Secondary | ICD-10-CM | POA: Diagnosis not present

## 2015-06-24 DIAGNOSIS — D631 Anemia in chronic kidney disease: Secondary | ICD-10-CM | POA: Diagnosis not present

## 2015-06-24 DIAGNOSIS — R600 Localized edema: Secondary | ICD-10-CM | POA: Diagnosis not present

## 2015-06-24 DIAGNOSIS — E876 Hypokalemia: Secondary | ICD-10-CM | POA: Diagnosis not present

## 2015-07-04 ENCOUNTER — Other Ambulatory Visit: Payer: Self-pay | Admitting: Unknown Physician Specialty

## 2015-07-04 NOTE — Telephone Encounter (Signed)
Your patient.  Thanks 

## 2015-07-06 DIAGNOSIS — M2391 Unspecified internal derangement of right knee: Secondary | ICD-10-CM | POA: Diagnosis not present

## 2015-07-12 ENCOUNTER — Other Ambulatory Visit: Payer: Self-pay | Admitting: Orthopedic Surgery

## 2015-07-12 DIAGNOSIS — M2391 Unspecified internal derangement of right knee: Secondary | ICD-10-CM

## 2015-07-26 ENCOUNTER — Other Ambulatory Visit: Payer: Self-pay | Admitting: Family Medicine

## 2015-07-27 ENCOUNTER — Ambulatory Visit
Admission: RE | Admit: 2015-07-27 | Discharge: 2015-07-27 | Disposition: A | Discharge status: Home / Self Care | Payer: Commercial Managed Care - HMO | Source: Ambulatory Visit | Attending: Orthopedic Surgery | Admitting: Orthopedic Surgery

## 2015-07-27 DIAGNOSIS — M23321 Other meniscus derangements, posterior horn of medial meniscus, right knee: Secondary | ICD-10-CM | POA: Insufficient documentation

## 2015-07-27 DIAGNOSIS — M2391 Unspecified internal derangement of right knee: Secondary | ICD-10-CM

## 2015-07-27 DIAGNOSIS — S83231A Complex tear of medial meniscus, current injury, right knee, initial encounter: Secondary | ICD-10-CM | POA: Diagnosis not present

## 2015-07-27 DIAGNOSIS — M25561 Pain in right knee: Secondary | ICD-10-CM | POA: Insufficient documentation

## 2015-08-03 DIAGNOSIS — E1169 Type 2 diabetes mellitus with other specified complication: Secondary | ICD-10-CM | POA: Diagnosis not present

## 2015-08-03 DIAGNOSIS — Z7984 Long term (current) use of oral hypoglycemic drugs: Secondary | ICD-10-CM | POA: Diagnosis not present

## 2015-08-03 DIAGNOSIS — N401 Enlarged prostate with lower urinary tract symptoms: Secondary | ICD-10-CM | POA: Diagnosis not present

## 2015-08-03 DIAGNOSIS — E663 Overweight: Secondary | ICD-10-CM | POA: Diagnosis not present

## 2015-08-03 DIAGNOSIS — E782 Mixed hyperlipidemia: Secondary | ICD-10-CM | POA: Diagnosis not present

## 2015-08-03 DIAGNOSIS — I10 Essential (primary) hypertension: Secondary | ICD-10-CM | POA: Diagnosis not present

## 2015-08-03 DIAGNOSIS — Z0000 Encounter for general adult medical examination without abnormal findings: Secondary | ICD-10-CM | POA: Diagnosis not present

## 2015-08-03 DIAGNOSIS — I25709 Atherosclerosis of coronary artery bypass graft(s), unspecified, with unspecified angina pectoris: Secondary | ICD-10-CM | POA: Diagnosis not present

## 2015-08-23 ENCOUNTER — Telehealth: Payer: Self-pay | Admitting: Family Medicine

## 2015-08-23 DIAGNOSIS — M2391 Unspecified internal derangement of right knee: Secondary | ICD-10-CM | POA: Diagnosis not present

## 2015-08-23 NOTE — Telephone Encounter (Signed)
Pt called needs refill on the following:  Glyburide   Pharm is [facility]. Thanks.

## 2015-08-23 NOTE — Telephone Encounter (Signed)
Verified with [facility], patient  Has refills

## 2015-09-02 ENCOUNTER — Encounter
Admission: RE | Admit: 2015-09-02 | Discharge: 2015-09-02 | Disposition: A | Discharge status: Home / Self Care | Payer: Commercial Managed Care - HMO | Source: Ambulatory Visit | Attending: Orthopedic Surgery | Admitting: Orthopedic Surgery

## 2015-09-02 DIAGNOSIS — Z01812 Encounter for preprocedural laboratory examination: Secondary | ICD-10-CM | POA: Insufficient documentation

## 2015-09-02 DIAGNOSIS — Z01810 Encounter for preprocedural cardiovascular examination: Secondary | ICD-10-CM | POA: Insufficient documentation

## 2015-09-02 DIAGNOSIS — I10 Essential (primary) hypertension: Secondary | ICD-10-CM | POA: Diagnosis not present

## 2015-09-02 HISTORY — DX: Gastro-esophageal reflux disease without esophagitis: K21.9

## 2015-09-02 LAB — DIFFERENTIAL
BASOS ABS: 0.1 10*3/uL (ref 0–0.1)
BASOS PCT: 1 %
Eosinophils Absolute: 0.4 10*3/uL (ref 0–0.7)
Eosinophils Relative: 6 %
LYMPHS PCT: 18 %
Lymphs Abs: 1.3 10*3/uL (ref 1.0–3.6)
MONOS PCT: 7 %
Monocytes Absolute: 0.5 10*3/uL (ref 0.2–1.0)
NEUTROS ABS: 4.9 10*3/uL (ref 1.4–6.5)
Neutrophils Relative %: 68 %

## 2015-09-02 LAB — CBC
HEMATOCRIT: 38.9 % — AB (ref 40.0–52.0)
Hemoglobin: 13.5 g/dL (ref 13.0–18.0)
MCH: 28.6 pg (ref 26.0–34.0)
MCHC: 34.8 g/dL (ref 32.0–36.0)
MCV: 82.3 fL (ref 80.0–100.0)
PLATELETS: 223 10*3/uL (ref 150–440)
RBC: 4.73 MIL/uL (ref 4.40–5.90)
RDW: 13.2 % (ref 11.5–14.5)
WBC: 7.2 10*3/uL (ref 3.8–10.6)

## 2015-09-02 LAB — BASIC METABOLIC PANEL
ANION GAP: 8 (ref 5–15)
BUN: 23 mg/dL — ABNORMAL HIGH (ref 6–20)
CO2: 29 mmol/L (ref 22–32)
Calcium: 10.4 mg/dL — ABNORMAL HIGH (ref 8.9–10.3)
Chloride: 102 mmol/L (ref 101–111)
Creatinine, Ser: 1.27 mg/dL — ABNORMAL HIGH (ref 0.61–1.24)
GFR calc Af Amer: >60 mL/min (ref >60)
GFR, EST NON AFRICAN AMERICAN: 54 mL/min — AB (ref >60)
Glucose, Bld: 143 mg/dL — ABNORMAL HIGH (ref 65–99)
POTASSIUM: 3.7 mmol/L (ref 3.5–5.1)
SODIUM: 139 mmol/L (ref 135–145)

## 2015-09-02 NOTE — Patient Instructions (Signed)
  Your procedure is scheduled on: Monday Jan. 9, 2017. Report to Same Day Surgery. To find out your arrival time please call [phone] between 1PM - 3PM on Friday Jan.6, 2017.  Remember: Instructions that are not followed completely may result in serious medical risk, up to and including death, or upon the discretion of your surgeon and anesthesiologist your surgery may need to be rescheduled.    _x___ 1. Do not eat food or drink liquids after midnight. No gum chewing or hard candies.     ____ 2. No Alcohol for 24 hours before or after surgery.   ____ 3. Bring all medications with you on the day of surgery if instructed.    __x__ 4. Notify your doctor if there is any change in your medical condition     (cold, fever, infections).     Do not wear jewelry, make-up, hairpins, clips or nail polish.  Do not wear lotions, powders, or perfumes. You may wear deodorant.  Do not shave 48 hours prior to surgery. Men may shave face and neck.  Do not bring valuables to the hospital.    [facility] is not responsible for any belongings or valuables.               Contacts, dentures or bridgework may not be worn into surgery.  Leave your suitcase in the car. After surgery it may be brought to your room.  For patients admitted to the hospital, discharge time is determined by your treatment team.   Patients discharged the day of surgery will not be allowed to drive home.    Please read over the following fact sheets that you were given:   [facility] Preparing for Surgery  __x__ Take these medicines the morning of surgery with A SIP OF WATER:    1. amLODipine (NORVASC)  2. carvedilol (COREG)  3. cloNIDine (CATAPRES)  4.doxazosin (CARDURA)  5.omeprazole (PRILOSEC)    ____ Fleet Enema (as directed)   _x___ Use CHG Soap as directed  ____ Use inhalers on the day of surgery  _x___ Stop metformin 2 days prior to surgery on September 10, 2015.  Last dose will be on September 08, 2014.    ____  Take 1/2 of usual insulin dose the night before surgery and none on the morning of surgery.   ____ Stop Coumadin/Plavix/aspirin on does not apply.  ____ Stop Anti-inflammatories Ibuprofen, motrin, advil, aleve, aspirin  Now, can take Tylenol for pain if needed.   __x__ Stop supplements Glucosamine Sulfate until after surgery.    ____ Bring C-Pap to the hospital.

## 2015-09-07 ENCOUNTER — Ambulatory Visit (INDEPENDENT_AMBULATORY_CARE_PROVIDER_SITE_OTHER): Payer: Commercial Managed Care - HMO | Admitting: Family Medicine

## 2015-09-07 ENCOUNTER — Encounter: Payer: Self-pay | Admitting: Family Medicine

## 2015-09-07 VITALS — BP 132/73 | HR 62 | Temp 97.8°F | Ht 69.3 in | Wt 202.0 lb

## 2015-09-07 DIAGNOSIS — I10 Essential (primary) hypertension: Secondary | ICD-10-CM

## 2015-09-07 DIAGNOSIS — Z0000 Encounter for general adult medical examination without abnormal findings: Secondary | ICD-10-CM | POA: Diagnosis not present

## 2015-09-07 DIAGNOSIS — E119 Type 2 diabetes mellitus without complications: Secondary | ICD-10-CM | POA: Diagnosis not present

## 2015-09-07 DIAGNOSIS — E785 Hyperlipidemia, unspecified: Secondary | ICD-10-CM

## 2015-09-07 NOTE — Assessment & Plan Note (Signed)
The current medical regimen is effective;  continue present plan and medications.  

## 2015-09-07 NOTE — Progress Notes (Signed)
BP 132/73 mmHg  Pulse 62  Temp(Src) 97.8 F (36.6 C)  Ht 5' 9.3" (1.76 m)  Wt 202 lb (91.627 kg)  BMI 29.58 kg/m2  SpO2 97%   Subjective:    Patient ID: Ryan Kirby, male    DOB: 1941-07-26, 75 y.o.   MRN: [id number]  HPI: Ryan Kirby is a 75 y.o. male  Chief Complaint  Patient presents with  . Diabetes   Patient doing well with diabetes noted low blood sugar spells no issues with medications side effects Blood pressures been doing well no complaints taking medications faithfully Cholesterol another medicines doing well reviewed patient's health screening report reports on routine health maintenance items which are scheduled or will be scheduled. Relevant past medical, surgical, family and social history reviewed and updated as indicated. Interim medical history since our last visit reviewed. Allergies and medications reviewed and updated.  Review of Systems  Constitutional: Negative.   Respiratory: Negative.   Cardiovascular: Negative.     Per HPI unless specifically indicated above     Objective:    BP 132/73 mmHg  Pulse 62  Temp(Src) 97.8 F (36.6 C)  Ht 5' 9.3" (1.76 m)  Wt 202 lb (91.627 kg)  BMI 29.58 kg/m2  SpO2 97%  Wt Readings from Last 3 Encounters:  09/07/15 202 lb (91.627 kg)  09/02/15 205 lb (92.987 kg)  06/02/15 203 lb 6.4 oz (92.262 kg)    Physical Exam  Constitutional: He is oriented to person, place, and time. He appears well-developed and well-nourished. No distress.  HENT:  Head: Normocephalic and atraumatic.  Right Ear: Hearing normal.  Left Ear: Hearing normal.  Nose: Nose normal.  Eyes: Conjunctivae and lids are normal. Right eye exhibits no discharge. Left eye exhibits no discharge. No scleral icterus.  Cardiovascular: Normal rate, regular rhythm and normal heart sounds.   Pulmonary/Chest: Effort normal and breath sounds normal. No respiratory distress.  Musculoskeletal: Normal range of motion.  Neurological: He is alert and  oriented to person, place, and time.  Skin: Skin is intact. No rash noted.  Psychiatric: He has a normal mood and affect. His speech is normal and behavior is normal. Judgment and thought content normal. Cognition and memory are normal.    Results for orders placed or performed during the hospital encounter of 09/02/15  CBC  Result Value Ref Range   WBC 7.2 3.8 - 10.6 K/uL   RBC 4.73 4.40 - 5.90 MIL/uL   Hemoglobin 13.5 13.0 - 18.0 g/dL   HCT 38.9 (L) 40.0 - 52.0 %   MCV 82.3 80.0 - 100.0 fL   MCH 28.6 26.0 - 34.0 pg   MCHC 34.8 32.0 - 36.0 g/dL   RDW 13.2 11.5 - 14.5 %   Platelets 223 150 - 440 K/uL  Differential  Result Value Ref Range   Neutrophils Relative % 68 %   Neutro Abs 4.9 1.4 - 6.5 K/uL   Lymphocytes Relative 18 %   Lymphs Abs 1.3 1.0 - 3.6 K/uL   Monocytes Relative 7 %   Monocytes Absolute 0.5 0.2 - 1.0 K/uL   Eosinophils Relative 6 %   Eosinophils Absolute 0.4 0 - 0.7 K/uL   Basophils Relative 1 %   Basophils Absolute 0.1 0 - 0.1 K/uL  Basic metabolic panel  Result Value Ref Range   Sodium 139 135 - 145 mmol/L   Potassium 3.7 3.5 - 5.1 mmol/L   Chloride 102 101 - 111 mmol/L   CO2 29 22 -  32 mmol/L   Glucose, Bld 143 (H) 65 - 99 mg/dL   BUN 23 (H) 6 - 20 mg/dL   Creatinine, Ser 1.27 (H) 0.61 - 1.24 mg/dL   Calcium 10.4 (H) 8.9 - 10.3 mg/dL   GFR calc non Af Amer 54 (L) >60 mL/min   GFR calc Af Amer >60 >60 mL/min   Anion gap 8 5 - 15      Assessment & Plan:   Problem List Items Addressed This Visit      Cardiovascular and Mediastinum   Hypertension    The current medical regimen is effective;  continue present plan and medications.         Endocrine   Diabetes mellitus without complication ([place])    The current medical regimen is effective;  continue present plan and medications.       Relevant Orders   Bayer DCA Hb A1c Waived     Other   Hyperlipidemia    The current medical regimen is effective;  continue present plan and  medications.        Other Visit Diagnoses    Routine general medical examination at a health care facility    -  Primary        Follow up plan: Return in about 3 months (around 12/06/2015) for Physical Exam AND A1C.

## 2015-09-08 LAB — BAYER DCA HB A1C WAIVED: HB A1C (BAYER DCA - WAIVED): 7 % — ABNORMAL HIGH (ref <7.0)

## 2015-09-12 ENCOUNTER — Ambulatory Visit: Payer: Commercial Managed Care - HMO | Admitting: Anesthesiology

## 2015-09-12 ENCOUNTER — Ambulatory Visit
Admission: RE | Admit: 2015-09-12 | Discharge: 2015-09-12 | Disposition: A | Discharge status: Home / Self Care | Payer: Commercial Managed Care - HMO | Source: Ambulatory Visit | Attending: Orthopedic Surgery | Admitting: Orthopedic Surgery

## 2015-09-12 ENCOUNTER — Encounter
Admission: RE | Disposition: A | Discharge status: Home / Self Care | Payer: Self-pay | Source: Ambulatory Visit | Attending: Orthopedic Surgery

## 2015-09-12 DIAGNOSIS — Z791 Long term (current) use of non-steroidal anti-inflammatories (NSAID): Secondary | ICD-10-CM | POA: Diagnosis not present

## 2015-09-12 DIAGNOSIS — Z7951 Long term (current) use of inhaled steroids: Secondary | ICD-10-CM | POA: Diagnosis not present

## 2015-09-12 DIAGNOSIS — Z79899 Other long term (current) drug therapy: Secondary | ICD-10-CM | POA: Diagnosis not present

## 2015-09-12 DIAGNOSIS — Z951 Presence of aortocoronary bypass graft: Secondary | ICD-10-CM | POA: Insufficient documentation

## 2015-09-12 DIAGNOSIS — E785 Hyperlipidemia, unspecified: Secondary | ICD-10-CM | POA: Diagnosis not present

## 2015-09-12 DIAGNOSIS — I129 Hypertensive chronic kidney disease with stage 1 through stage 4 chronic kidney disease, or unspecified chronic kidney disease: Secondary | ICD-10-CM | POA: Insufficient documentation

## 2015-09-12 DIAGNOSIS — S83281A Other tear of lateral meniscus, current injury, right knee, initial encounter: Secondary | ICD-10-CM | POA: Diagnosis not present

## 2015-09-12 DIAGNOSIS — I2510 Atherosclerotic heart disease of native coronary artery without angina pectoris: Secondary | ICD-10-CM | POA: Diagnosis not present

## 2015-09-12 DIAGNOSIS — N189 Chronic kidney disease, unspecified: Secondary | ICD-10-CM | POA: Insufficient documentation

## 2015-09-12 DIAGNOSIS — M2391 Unspecified internal derangement of right knee: Secondary | ICD-10-CM | POA: Diagnosis not present

## 2015-09-12 DIAGNOSIS — E1122 Type 2 diabetes mellitus with diabetic chronic kidney disease: Secondary | ICD-10-CM | POA: Diagnosis not present

## 2015-09-12 DIAGNOSIS — X58XXXA Exposure to other specified factors, initial encounter: Secondary | ICD-10-CM | POA: Insufficient documentation

## 2015-09-12 DIAGNOSIS — I25810 Atherosclerosis of coronary artery bypass graft(s) without angina pectoris: Secondary | ICD-10-CM | POA: Diagnosis not present

## 2015-09-12 DIAGNOSIS — Z885 Allergy status to narcotic agent status: Secondary | ICD-10-CM | POA: Insufficient documentation

## 2015-09-12 DIAGNOSIS — M23251 Derangement of posterior horn of lateral meniscus due to old tear or injury, right knee: Secondary | ICD-10-CM | POA: Diagnosis not present

## 2015-09-12 DIAGNOSIS — M2241 Chondromalacia patellae, right knee: Secondary | ICD-10-CM | POA: Insufficient documentation

## 2015-09-12 DIAGNOSIS — Z7984 Long term (current) use of oral hypoglycemic drugs: Secondary | ICD-10-CM | POA: Diagnosis not present

## 2015-09-12 DIAGNOSIS — M23351 Other meniscus derangements, posterior horn of lateral meniscus, right knee: Secondary | ICD-10-CM | POA: Diagnosis present

## 2015-09-12 HISTORY — PX: KNEE ARTHROSCOPY: SHX127

## 2015-09-12 LAB — GLUCOSE, CAPILLARY
GLUCOSE-CAPILLARY: 181 mg/dL — AB (ref 65–99)
GLUCOSE-CAPILLARY: 164 mg/dL — AB (ref 65–99)

## 2015-09-12 SURGERY — ARTHROSCOPY, KNEE
Anesthesia: General | Site: Knee | Laterality: Right | Wound class: Clean

## 2015-09-12 MED ORDER — PROPOFOL 10 MG/ML IV BOLUS
INTRAVENOUS | Status: DC | PRN
  Administered 2015-09-12: 120 mg via INTRAVENOUS

## 2015-09-12 MED ORDER — ACETAMINOPHEN 10 MG/ML IV SOLN
INTRAVENOUS | Status: AC
  Filled 2015-09-12: qty 100

## 2015-09-12 MED ORDER — FENTANYL CITRATE (PF) 100 MCG/2ML IJ SOLN
INTRAMUSCULAR | Status: DC | PRN
  Administered 2015-09-12: 100 ug via INTRAVENOUS
  Administered 2015-09-12 (×3): 25 ug via INTRAVENOUS

## 2015-09-12 MED ORDER — FENTANYL CITRATE (PF) 100 MCG/2ML IJ SOLN: 25.0000 ug | INTRAMUSCULAR | Status: DC | PRN

## 2015-09-12 MED ORDER — LIDOCAINE HCL (CARDIAC) 20 MG/ML IV SOLN
INTRAVENOUS | Status: DC | PRN
  Administered 2015-09-12: 100 mg via INTRAVENOUS

## 2015-09-12 MED ORDER — BUPIVACAINE HCL 0.25 % IJ SOLN
INTRAMUSCULAR | Status: DC | PRN
  Administered 2015-09-12: 30 mL

## 2015-09-12 MED ORDER — HYDROCODONE-ACETAMINOPHEN 5-325 MG PO TABS: 1.0000 | ORAL_TABLET | ORAL | Status: DC | PRN

## 2015-09-12 MED ORDER — ONDANSETRON HCL 4 MG/2ML IJ SOLN: 4.0000 mg | Freq: Once | INTRAMUSCULAR | Status: DC | PRN

## 2015-09-12 MED ORDER — EPHEDRINE SULFATE 50 MG/ML IJ SOLN
INTRAMUSCULAR | Status: DC | PRN
  Administered 2015-09-12 (×4): 5 mg via INTRAVENOUS

## 2015-09-12 MED ORDER — GLYCOPYRROLATE 0.2 MG/ML IJ SOLN
INTRAMUSCULAR | Status: DC | PRN
  Administered 2015-09-12: 0.2 mg via INTRAVENOUS

## 2015-09-12 MED ORDER — MORPHINE SULFATE (PF) 4 MG/ML IV SOLN
INTRAVENOUS | Status: DC | PRN
  Administered 2015-09-12: 4 mg

## 2015-09-12 MED ORDER — SODIUM CHLORIDE 0.9 % IV SOLN
INTRAVENOUS | Status: DC
  Administered 2015-09-12: 12:00:00 via INTRAVENOUS

## 2015-09-12 MED ORDER — ONDANSETRON HCL 4 MG/2ML IJ SOLN
INTRAMUSCULAR | Status: DC | PRN
  Administered 2015-09-12: 4 mg via INTRAVENOUS

## 2015-09-12 MED ORDER — BUPIVACAINE-EPINEPHRINE (PF) 0.25% -1:200000 IJ SOLN
INTRAMUSCULAR | Status: AC
  Filled 2015-09-12: qty 30

## 2015-09-12 MED ORDER — ACETAMINOPHEN 10 MG/ML IV SOLN
INTRAVENOUS | Status: DC | PRN
  Administered 2015-09-12: 1000 mg via INTRAVENOUS

## 2015-09-12 MED ORDER — MORPHINE SULFATE (PF) 4 MG/ML IV SOLN
INTRAVENOUS | Status: AC
  Filled 2015-09-12: qty 1

## 2015-09-12 SURGICAL SUPPLY — 23 items
BLADE SHAVER 4.5 DBL SERAT CV (CUTTER) ×2 IMPLANT
BNDG ESMARK 6X12 TAN STRL LF (GAUZE/BANDAGES/DRESSINGS) ×2 IMPLANT
DRSG DERMACEA 8X12 NADH (GAUZE/BANDAGES/DRESSINGS) ×2 IMPLANT
DURAPREP 26ML APPLICATOR (WOUND CARE) ×4 IMPLANT
GAUZE SPONGE 4X4 12PLY STRL (GAUZE/BANDAGES/DRESSINGS) ×2 IMPLANT
GLOVE BIOGEL M STRL SZ7.5 (GLOVE) ×2 IMPLANT
GLOVE INDICATOR 8.0 STRL GRN (GLOVE) ×2 IMPLANT
GOWN STRL REUS W/ TWL LRG LVL3 (GOWN DISPOSABLE) ×1 IMPLANT
GOWN STRL REUS W/ TWL LRG LVL4 (GOWN DISPOSABLE) ×1 IMPLANT
GOWN STRL REUS W/TWL LRG LVL3 (GOWN DISPOSABLE) ×1
GOWN STRL REUS W/TWL LRG LVL4 (GOWN DISPOSABLE) ×1
IV LACTATED RINGER IRRG 3000ML (IV SOLUTION) ×6
IV LR IRRIG 3000ML ARTHROMATIC (IV SOLUTION) ×6 IMPLANT
MANIFOLD NEPTUNE II (INSTRUMENTS) ×2 IMPLANT
PACK ARTHROSCOPY KNEE (MISCELLANEOUS) ×2 IMPLANT
SET TUBE SUCT SHAVER OUTFL 24K (TUBING) ×2 IMPLANT
SET TUBE TIP INTRA-ARTICULAR (MISCELLANEOUS) ×2 IMPLANT
STRAP SAFETY BODY (MISCELLANEOUS) ×2 IMPLANT
SUT ETHILON 3-0 FS-10 30 BLK (SUTURE) ×2
SUTURE EHLN 3-0 FS-10 30 BLK (SUTURE) ×1 IMPLANT
TUBING ARTHRO INFLOW-ONLY STRL (TUBING) ×2 IMPLANT
WAND HAND CNTRL MULTIVAC 50 (MISCELLANEOUS) ×2 IMPLANT
WRAP KNEE W/COLD PACKS 25.5X14 (SOFTGOODS) ×2 IMPLANT

## 2015-09-12 NOTE — Anesthesia Procedure Notes (Signed)
Procedure Name: LMA Insertion Date/Time: 09/12/2015 11:55 AM Performed by: Rosaria Ferries, Raquel Racey Pre-anesthesia Checklist: Patient identified, Emergency Drugs available, Suction available and Patient being monitored Patient Re-evaluated:Patient Re-evaluated prior to inductionOxygen Delivery Method: Circle system utilized Preoxygenation: Pre-oxygenation with 100% oxygen Intubation Type: IV induction LMA Size: 5.0 Number of attempts: 1 Tube secured with: Tape Dental Injury: Teeth and Oropharynx as per pre-operative assessment

## 2015-09-12 NOTE — Anesthesia Postprocedure Evaluation (Signed)
Anesthesia Post Note  Patient: Ryan Kirby  Procedure(s) Performed: Procedure(s) (LRB): ARTHROSCOPY KNEE, LATERAL MENISECTOMY, CHONDROPLASTY (Right)  Patient location during evaluation: PACU Anesthesia Type: General Level of consciousness: awake Pain management: pain level controlled Vital Signs Assessment: post-procedure vital signs reviewed and stable Respiratory status: spontaneous breathing Cardiovascular status: blood pressure returned to baseline Anesthetic complications: no    Last Vitals:  Filed Vitals:   09/12/15 1427 09/12/15 1445  BP: 138/70 136/75  Pulse: 62 58  Temp:    Resp: 18 18    Last Pain:  Filed Vitals:   09/12/15 1446  PainSc: 0-No pain                 Kadisha Goodine S

## 2015-09-12 NOTE — Discharge Instructions (Signed)
°  Instructions after Knee Arthroscopy  ° ° James P. Hooten, Jr., M.D.    ° Dept. of Orthopaedics & Sports Medicine ° [facility] ° [address] ° [place], [place]  [address] ° ° Phone: [phone]   Fax: [phone] ° ° °DIET: °• Drink plenty of non-alcoholic fluids & begin a light diet. °• Resume your normal diet the day after surgery. ° °ACTIVITY:  °• You may use crutches or a walker with weight-bearing as tolerated, unless instructed otherwise. °• You may wean yourself off of the walker or crutches as tolerated.  °• Begin doing gentle exercises. Exercising will reduce the pain and swelling, increase motion, and prevent muscle weakness.   °• Avoid strenuous activities or athletics for a minimum of 4-6 weeks after arthroscopic surgery. °• Do not drive or operate any equipment until instructed. ° °WOUND CARE:  °• Place one to two pillows under the knee the first day or two when sitting or lying.  °• Continue to use the ice packs periodically to reduce pain and swelling. °• The small incisions in your knee are closed with nylon stitches. The stitches will be removed in the office. °• The bulky dressing may be removed on the second day after surgery. DO NOT TOUCH THE STITCHES. Put a Band-Aid over each stitch. Do NOT use any ointments or creams on the incisions.  °• You may bathe or shower after the stitches are removed at the first office visit following surgery. ° °MEDICATIONS: °• You may resume your regular medications. °• Please take the pain medication as prescribed. °• Do not take pain medication on an empty stomach. °• Do not drive or drink alcoholic beverages when taking pain medications. ° °CALL THE OFFICE FOR: °• Temperature above 101 degrees °• Excessive bleeding or drainage on the dressing. °• Excessive swelling, coldness, or paleness of the toes. °• Persistent nausea and vomiting. ° °FOLLOW-UP:  °• You should have an appointment to return to the office in 7-10 days after surgery.  ° ° °AMBULATORY  SURGERY  °DISCHARGE INSTRUCTIONS ° °1) The drugs that you were given will stay in your system until tomorrow so for the next 24 hours you should not: ° °A) Drive an automobile °B) Make any legal decisions °C) Drink any alcoholic beverage ° °2) You may resume regular meals tomorrow.  Today it is better to start with liquids and gradually work up to solid foods. °You may eat anything you prefer, but it is better to start with liquids, then soup and crackers, and gradually work up to solid foods. ° °3) Please notify your doctor immediately if you have any unusual bleeding, trouble breathing, redness and pain at the surgery site, drainage, fever, or pain not relieved by medication. ° °Please contact your physician with any problems or Same Day Surgery at [phone], Monday through Friday 6 am to 4 pm, or [place] [facility] [place] Main number at [phone]. °  °

## 2015-09-12 NOTE — Transfer of Care (Signed)
Immediate Anesthesia Transfer of Care Note  Patient: Ryan Kirby  Procedure(s) Performed: Procedure(s): ARTHROSCOPY KNEE, LATERAL MENISECTOMY, CHONDROPLASTY (Right)  Patient Location: PACU  Anesthesia Type:General  Level of Consciousness: sedated  Airway & Oxygen Therapy: Patient Spontanous Breathing and Patient connected to face mask oxygen  Post-op Assessment: Report given to RN and Post -op Vital signs reviewed and stable  Post vital signs: Reviewed and stable  Last Vitals:  Filed Vitals:   09/12/15 1105 09/12/15 1322  BP: 143/73 126/66  Pulse: 69 59  Temp: 36.4 C 36.1 C  Resp: 14 0    Complications: No apparent anesthesia complications

## 2015-09-12 NOTE — H&P (Signed)
The patient has been re-examined, and the chart reviewed, and there have been no interval changes to the documented history and physical.    The risks, benefits, and alternatives have been discussed at length. The patient expressed understanding of the risks benefits and agreed with plans for surgical intervention.  Elysha Daw P. Deatra Mcmahen, Jr. M.D.    

## 2015-09-12 NOTE — Op Note (Signed)
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   ASSISTANT: none  ANESTHESIA: general  ESTIMATED BLOOD LOSS: Minimal  FLUIDS REPLACED: 850 mL of crystalloid  TOURNIQUET TIME: Not used   DRAINS: none  IMPLANTS UTILIZED: None  INDICATIONS FOR SURGERY: Ryan Kirby is a 75 y.o. year old male who has been seen for complaints of right knee pain. MRI demonstrated findings consistent with meniscal pathology. After discussion of the risks and benefits of surgical intervention, the patient expressed understanding of the risks benefits and agree with plans for right knee arthroscopy.   PROCEDURE IN DETAIL: The patient was brought into the operating room and, after adequate general anesthesia was achieved, a tourniquet was applied to the right thigh and the leg was placed in the leg holder. All bony prominences were well padded. The patient's right knee was cleaned and prepped with alcohol and Duraprep and draped in the usual sterile fashion. A "timeout" was performed as per usual protocol. The anticipated portal sites were injected with 0.25% Marcaine with epinephrine. An anterolateral incision was made and a cannula was inserted. A small effusion was evacuated and the knee was distended with fluid using the pump. The scope was advanced down the medial gutter into the medial compartment. Under visualization with the scope, an anteromedial portal was created and a hooked probe was inserted. The medial meniscus was visualized and probed. The medial meniscus was stable without  evidence of tear or instability. The articular cartilage was visualized and felt to be in good condition.  The scope was then advanced into the intercondylar notch. The anterior cruciate ligament was visualized and probed and felt to be intact. The scope was removed from the lateral portal and reinserted via the anteromedial portal to better visualize the lateral compartment. The lateral meniscus was visualized and probed. There was a degenerative tear of the posterior horn of the lateral meniscus. The tear was debrided using meniscal punches and a 4.5 mm shaver. Final contouring was performed using an ArthroCare wand. Some early degenerative changes are noted to the anterior horn of lateral meniscus and these areas were debrided and contoured using the ArthroCare wand. The articular cartilage of the lateral compartment was visualized and felt to be in good condition. Finally, the scope was advanced so as to visualize the patellofemoral articulation. Good patellar tracking was appreciated. There was a "pull area of grade 3 chondromalacia to the lateral femoral condyle and this area was debrided and contoured using the ArthroCare wand.  The knee was irrigated with copius amounts of fluid and suctioned dry. The anterolateral portal was re-approximated with #3-0 nylon. A combination of 0.25% Marcaine with epinephrine and 4 mg of Morphine were injected via the scope. The scope was removed and the anteromedial portal was re-approximated with #3-0 nylon. A sterile dressing was applied followed by application of an ice wrap.  The patient tolerated the procedure well and was transported to the PACU in stable condition.  James P. Holley Bouche., M.D.

## 2015-09-12 NOTE — Brief Op Note (Signed)
09/12/2015  1:23 PM  PATIENT:  Ryan Kirby  75 y.o. male  PRE-OPERATIVE DIAGNOSIS:  internal derangement of the right knee  POST-OPERATIVE DIAGNOSIS:   TEAR LATERAL MENISCUS, left knee CHONDROMALACIA OF PATELLOFEMORAL COMPARTMENT, left knee  PROCEDURE:  Procedure(s): ARTHROSCOPY KNEE, LATERAL MENISECTOMY, CHONDROPLASTY (Right)  SURGEON:  Surgeon(s) and Role:    * Dereck Leep, MD - Primary  ASSISTANTS: none   ANESTHESIA:   general  EBL:  Total I/O In: 850 [I.V.:850] Out: 5 [Blood:5]  BLOOD ADMINISTERED:none  DRAINS: none   LOCAL MEDICATIONS USED:  MARCAINE     SPECIMEN:  No Specimen  DISPOSITION OF SPECIMEN:  N/A  COUNTS:  YES  TOURNIQUET: Not used   DICTATION: .Dragon Dictation  PLAN OF CARE: Discharge to home after PACU  PATIENT DISPOSITION:  PACU - hemodynamically stable.   Delay start of Pharmacological VTE agent (>24hrs) due to surgical blood loss or risk of bleeding: not applicable

## 2015-09-12 NOTE — Anesthesia Preprocedure Evaluation (Addendum)
Anesthesia Evaluation  Patient identified by MRN, date of birth, ID band Patient awake    Reviewed: Allergy & Precautions, NPO status , Patient's Chart, lab work & pertinent test results, reviewed documented beta blocker date and time   Airway Mallampati: II  TM Distance: >3 FB     Dental  (+) Chipped   Pulmonary           Cardiovascular hypertension, Pt. on medications and Pt. on home beta blockers + CAD and + CABG       Neuro/Psych    GI/Hepatic GERD  Controlled,  Endo/Other  diabetes, Type 2  Renal/GU Renal InsufficiencyRenal disease     Musculoskeletal   Abdominal   Peds  Hematology   Anesthesia Other Findings EKG reviewed and OK.  Reproductive/Obstetrics                            Anesthesia Physical Anesthesia Plan  ASA: III  Anesthesia Plan: General   Post-op Pain Management:    Induction: Intravenous  Airway Management Planned: LMA  Additional Equipment:   Intra-op Plan:   Post-operative Plan:   Informed Consent: I have reviewed the patients History and Physical, chart, labs and discussed the procedure including the risks, benefits and alternatives for the proposed anesthesia with the patient or authorized representative who has indicated his/her understanding and acceptance.     Plan Discussed with: CRNA  Anesthesia Plan Comments:         Anesthesia Quick Evaluation

## 2015-10-28 DIAGNOSIS — N182 Chronic kidney disease, stage 2 (mild): Secondary | ICD-10-CM | POA: Diagnosis not present

## 2015-10-28 DIAGNOSIS — R600 Localized edema: Secondary | ICD-10-CM | POA: Diagnosis not present

## 2015-10-28 DIAGNOSIS — I10 Essential (primary) hypertension: Secondary | ICD-10-CM | POA: Diagnosis not present

## 2015-10-28 DIAGNOSIS — N183 Chronic kidney disease, stage 3 (moderate): Secondary | ICD-10-CM | POA: Diagnosis not present

## 2015-10-28 DIAGNOSIS — E876 Hypokalemia: Secondary | ICD-10-CM | POA: Diagnosis not present

## 2015-11-11 DIAGNOSIS — J302 Other seasonal allergic rhinitis: Secondary | ICD-10-CM | POA: Diagnosis not present

## 2015-11-11 DIAGNOSIS — H6123 Impacted cerumen, bilateral: Secondary | ICD-10-CM | POA: Diagnosis not present

## 2015-11-11 DIAGNOSIS — R0982 Postnasal drip: Secondary | ICD-10-CM | POA: Diagnosis not present

## 2015-11-15 ENCOUNTER — Encounter: Payer: Self-pay | Admitting: Family Medicine

## 2015-11-15 ENCOUNTER — Ambulatory Visit (INDEPENDENT_AMBULATORY_CARE_PROVIDER_SITE_OTHER): Payer: Commercial Managed Care - HMO | Admitting: Family Medicine

## 2015-11-15 VITALS — BP 133/72 | HR 71 | Temp 98.2°F | Ht 69.0 in | Wt 195.0 lb

## 2015-11-15 DIAGNOSIS — I10 Essential (primary) hypertension: Secondary | ICD-10-CM | POA: Diagnosis not present

## 2015-11-15 DIAGNOSIS — J019 Acute sinusitis, unspecified: Secondary | ICD-10-CM | POA: Diagnosis not present

## 2015-11-15 MED ORDER — AMOXICILLIN-POT CLAVULANATE 875-125 MG PO TABS: 1.0000 | ORAL_TABLET | Freq: Two times a day (BID) | ORAL | Status: DC

## 2015-11-15 MED ORDER — BENZONATATE 100 MG PO CAPS: 100.0000 mg | ORAL_CAPSULE | Freq: Three times a day (TID) | ORAL | Status: DC | PRN

## 2015-11-15 NOTE — Assessment & Plan Note (Signed)
Reviewed hyperension and medications patient doing well ith good contrl

## 2015-11-15 NOTE — Progress Notes (Addendum)
   BP 133/72 mmHg  Pulse 71  Temp(Src) 98.2 F (36.8 C)  Ht 5\' 9"  (1.753 m)  Wt 195 lb (88.451 kg)  BMI 28.78 kg/m2  SpO2 95%   Subjective:    Patient ID: Ryan Kirby, male    DOB: Sep 07, 1940, 75 y.o.   MRN: [id number]  HPI: Ryan Kirby is a 75 y.o. male  Chief Complaint  Patient presents with  . URI    x 1 week   patient with head congestion drainage fever or chills aches been ongoing for over a week more head cold and pressure syptoms sloshng sensation with bending over and mildly productive cough.  Relevant past medical, surgical, family and social history reviewed and updated as indicated. Interim medical history since our last visit reviewed. Allergies and medications reviewed and updated.   other than oted above Review of Systems  Constitutional: Positive for fever and fatigue.  HENT: Positive for congestion, rhinorrhea, sinus pressure, sneezing and sore throat.   Cardiovascular: Negative.   Gastrointestinal: Negative.     Per HPI unless specifically indicated above     Objective:    BP 133/72 mmHg  Pulse 71  Temp(Src) 98.2 F (36.8 C)  Ht 5\' 9"  (1.753 m)  Wt 195 lb (88.451 kg)  BMI 28.78 kg/m2  SpO2 95%  Wt Readings from Last 3 Encounters:  11/15/15 195 lb (88.451 kg)  09/12/15 205 lb (92.987 kg)  09/07/15 202 lb (91.627 kg)    Physical Exam  Constitutional: He is oriented to person, place, and time. He appears well-developed and well-nourished. No distress.  HENT:  Head: Normocephalic and atraumatic.  Right Ear: Hearing and external ear normal.  Left Ear: Hearing and external ear normal.  Nose: Nose normal.  Mouth/Throat: Oropharyngeal exudate present.  Eyes: Conjunctivae and lids are normal. Right eye exhibits no discharge. Left eye exhibits no discharge. No scleral icterus.  Cardiovascular: Normal rate, regular rhythm and normal heart sounds.   Pulmonary/Chest: Effort normal and breath sounds normal. No respiratory distress.   Musculoskeletal: Normal range of motion.  Neurological: He is alert and oriented to person, place, and time.  Skin: Skin is intact. No rash noted.  Psychiatric: He has a normal mood and affect. His speech is normal and behavior is normal. Judgment and thought content normal. Cognition and memory are normal.    Results for orders placed or performed during the hospital encounter of 09/12/15  Glucose, capillary  Result Value Ref Range   Glucose-Capillary 181 (H) 65 - 99 mg/dL  Glucose, capillary  Result Value Ref Range   Glucose-Capillary 164 (H) 65 - 99 mg/dL      Assessment & Plan:   Problem List Items Addressed This Visit      Cardiovascular and Mediastinum   Hypertension     Reviewed hyperension and medications patient doing well ith good contrl       Other Visit Diagnoses    Acute sinusitis, recurrence not specified, unspecified location    -  Primary     discussed sinusitis ccare and trreeatment Tyleenol sinus Muciinex aantibiotics and naasal rrinsse..    Relevant Medications    fluticasone (FLONASE) 50 MCG/ACT nasal spray    amoxicillin-clavulanate (AUGMENTIN) 875-125 MG tablet    benzonatate (TESSALON PERLES) 100 MG capsule        Follow up plan: Return for As scheduled.

## 2015-11-16 ENCOUNTER — Telehealth: Payer: Self-pay | Admitting: Family Medicine

## 2015-11-16 NOTE — Telephone Encounter (Signed)
Phone call patient concerned it and better yet reviewed patient did get his medicine but is only taking 3 pills reviewed to give it more time patient relates and has no symptoms of worsening.

## 2015-11-16 NOTE — Telephone Encounter (Signed)
Pt would like a call back regarding to his visit yesterday. Pt stated that he was feeling worse than he did when he came in.

## 2015-11-16 NOTE — Addendum Note (Signed)
Addended byGolden Pop on: 11/16/2015 05:11 PM   Modules accepted: Miquel Dunn

## 2015-11-17 ENCOUNTER — Other Ambulatory Visit: Payer: Self-pay | Admitting: Family Medicine

## 2015-12-01 ENCOUNTER — Ambulatory Visit (INDEPENDENT_AMBULATORY_CARE_PROVIDER_SITE_OTHER): Payer: Commercial Managed Care - HMO | Admitting: Family Medicine

## 2015-12-01 ENCOUNTER — Encounter: Payer: Self-pay | Admitting: Family Medicine

## 2015-12-01 VITALS — BP 113/55 | HR 66 | Temp 97.7°F | Ht 69.0 in | Wt 193.0 lb

## 2015-12-01 DIAGNOSIS — Z23 Encounter for immunization: Secondary | ICD-10-CM | POA: Diagnosis not present

## 2015-12-01 DIAGNOSIS — I10 Essential (primary) hypertension: Secondary | ICD-10-CM | POA: Diagnosis not present

## 2015-12-01 DIAGNOSIS — E119 Type 2 diabetes mellitus without complications: Secondary | ICD-10-CM

## 2015-12-01 DIAGNOSIS — Z0000 Encounter for general adult medical examination without abnormal findings: Secondary | ICD-10-CM

## 2015-12-01 DIAGNOSIS — E785 Hyperlipidemia, unspecified: Secondary | ICD-10-CM | POA: Diagnosis not present

## 2015-12-01 LAB — URINALYSIS, ROUTINE W REFLEX MICROSCOPIC
Bilirubin, UA: NEGATIVE
GLUCOSE, UA: NEGATIVE
Ketones, UA: NEGATIVE
Leukocytes, UA: NEGATIVE
Nitrite, UA: NEGATIVE
PROTEIN UA: NEGATIVE
RBC, UA: NEGATIVE
Specific Gravity, UA: 1.015 (ref 1.005–1.030)
Urobilinogen, Ur: 0.2 mg/dL (ref 0.2–1.0)
pH, UA: 5 (ref 5.0–7.5)

## 2015-12-01 LAB — BAYER DCA HB A1C WAIVED: HB A1C (BAYER DCA - WAIVED): 7.1 % — ABNORMAL HIGH (ref <7.0)

## 2015-12-01 MED ORDER — GLYBURIDE 5 MG PO TABS: 5.0000 mg | ORAL_TABLET | Freq: Every morning | ORAL | Status: DC

## 2015-12-01 MED ORDER — PRAVASTATIN SODIUM 40 MG PO TABS
40.0000 mg | ORAL_TABLET | Freq: Every day | ORAL | Status: DC
Start: 2015-12-01 — End: 2016-12-06

## 2015-12-01 MED ORDER — AMLODIPINE BESYLATE 10 MG PO TABS
ORAL_TABLET | ORAL | Status: DC
Start: 2015-12-01 — End: 2015-12-07

## 2015-12-01 MED ORDER — CLONIDINE HCL 0.3 MG PO TABS: 0.3000 mg | ORAL_TABLET | Freq: Two times a day (BID) | ORAL | Status: DC

## 2015-12-01 MED ORDER — CARVEDILOL 25 MG PO TABS: 50.0000 mg | ORAL_TABLET | Freq: Two times a day (BID) | ORAL | Status: DC

## 2015-12-01 MED ORDER — FUROSEMIDE 40 MG PO TABS: 40.0000 mg | ORAL_TABLET | Freq: Every day | ORAL | Status: DC

## 2015-12-01 MED ORDER — METFORMIN HCL 500 MG PO TABS: 500.0000 mg | ORAL_TABLET | Freq: Two times a day (BID) | ORAL | Status: DC

## 2015-12-01 MED ORDER — BENAZEPRIL-HYDROCHLOROTHIAZIDE 20-12.5 MG PO TABS: ORAL_TABLET | ORAL | Status: DC

## 2015-12-01 MED ORDER — POTASSIUM CHLORIDE 20 MEQ PO PACK: 20.0000 meq | PACK | Freq: Two times a day (BID) | ORAL | Status: DC

## 2015-12-01 NOTE — Assessment & Plan Note (Signed)
The current medical regimen is effective;  continue present plan and medications.  

## 2015-12-01 NOTE — Assessment & Plan Note (Addendum)
Discussed hemoglobin A1c 7.1 these to be a little better patient will do with diet exercise nutrition.

## 2015-12-01 NOTE — Progress Notes (Signed)
BP 113/55 mmHg  Pulse 66  Temp(Src) 97.7 F (36.5 C)  Ht 5\' 9"  (1.753 m)  Wt 193 lb (87.544 kg)  BMI 28.49 kg/m2  SpO2 99%   Subjective:    Patient ID: Ryan Kirby, male    DOB: 1941-02-22, 75 y.o.   MRN: [id number]  HPI: Ryan Kirby is a 75 y.o. male  Chief Complaint  Patient presents with  . Annual Exam  She doing better with sinus infection feeling better taking Augmentin without problems Blood pressure doing well without problems concerns Cholesterol doing well no side effects from medications Reflux stable on Nexium Taking medications faithfully without side effects  Relevant past medical, surgical, family and social history reviewed and updated as indicated. Interim medical history since our last visit reviewed. Allergies and medications reviewed and updated.  Review of Systems  Constitutional: Negative.   HENT: Negative.   Eyes: Negative.   Respiratory: Negative.   Cardiovascular: Negative.   Gastrointestinal: Negative.   Endocrine: Negative.   Genitourinary: Negative.   Musculoskeletal: Negative.   Skin: Negative.   Allergic/Immunologic: Negative.   Neurological: Negative.   Hematological: Negative.   Psychiatric/Behavioral: Negative.     Per HPI unless specifically indicated above     Objective:    BP 113/55 mmHg  Pulse 66  Temp(Src) 97.7 F (36.5 C)  Ht 5\' 9"  (1.753 m)  Wt 193 lb (87.544 kg)  BMI 28.49 kg/m2  SpO2 99%  Wt Readings from Last 3 Encounters:  12/01/15 193 lb (87.544 kg)  11/15/15 195 lb (88.451 kg)  09/12/15 205 lb (92.987 kg)    Physical Exam  Constitutional: He is oriented to person, place, and time. He appears well-developed and well-nourished.  HENT:  Head: Normocephalic and atraumatic.  Right Ear: External ear normal.  Left Ear: External ear normal.  Eyes: Conjunctivae and EOM are normal. Pupils are equal, round, and reactive to light.  Neck: Normal range of motion. Neck supple.  Cardiovascular: Normal rate,  regular rhythm, normal heart sounds and intact distal pulses.   Pulmonary/Chest: Effort normal and breath sounds normal.  Abdominal: Soft. Bowel sounds are normal. There is no splenomegaly or hepatomegaly.  Genitourinary: Rectum normal and penis normal.  Prostate enlarged  Musculoskeletal: Normal range of motion.  Neurological: He is alert and oriented to person, place, and time. He has normal reflexes.  Skin: No rash noted. No erythema.  Psychiatric: He has a normal mood and affect. His behavior is normal. Judgment and thought content normal.   Discussed will not do PSA Results for orders placed or performed during the hospital encounter of 09/12/15  Glucose, capillary  Result Value Ref Range   Glucose-Capillary 181 (H) 65 - 99 mg/dL  Glucose, capillary  Result Value Ref Range   Glucose-Capillary 164 (H) 65 - 99 mg/dL      Assessment & Plan:   Problem List Items Addressed This Visit      Cardiovascular and Mediastinum   Hypertension    The current medical regimen is effective;  continue present plan and medications.       Relevant Medications   amLODipine (NORVASC) 10 MG tablet   benazepril-hydrochlorthiazide (LOTENSIN HCT) 20-12.5 MG tablet   carvedilol (COREG) 25 MG tablet   cloNIDine (CATAPRES) 0.3 MG tablet   furosemide (LASIX) 40 MG tablet   potassium chloride (KLOR-CON) 20 MEQ packet   pravastatin (PRAVACHOL) 40 MG tablet   Other Relevant Orders   Comprehensive metabolic panel   CBC with Differential/Platelet  TSH   Urinalysis, Routine w reflex microscopic (not at [facility])     Endocrine   Diabetes mellitus without complication ([place])    Discussed hemoglobin A1c 7.1 these to be a little better patient will do with diet exercise nutrition.      Relevant Medications   benazepril-hydrochlorthiazide (LOTENSIN HCT) 20-12.5 MG tablet   glyBURIDE (DIABETA) 5 MG tablet   metFORMIN (GLUCOPHAGE) 500 MG tablet   pravastatin (PRAVACHOL) 40 MG tablet   Other Relevant  Orders   Bayer DCA Hb A1c Waived   Comprehensive metabolic panel   CBC with Differential/Platelet   TSH     Other   Hyperlipidemia    The current medical regimen is effective;  continue present plan and medications.       Relevant Medications   amLODipine (NORVASC) 10 MG tablet   benazepril-hydrochlorthiazide (LOTENSIN HCT) 20-12.5 MG tablet   carvedilol (COREG) 25 MG tablet   cloNIDine (CATAPRES) 0.3 MG tablet   furosemide (LASIX) 40 MG tablet   pravastatin (PRAVACHOL) 40 MG tablet   Other Relevant Orders   Lipid panel   TSH    Other Visit Diagnoses    Immunization due    -  Primary    Relevant Orders    Pneumococcal conjugate vaccine 13-valent IM (Completed)    PE (physical exam), annual            Follow up plan: Return in about 3 months (around 03/02/2016) for a1c.

## 2015-12-02 LAB — COMPREHENSIVE METABOLIC PANEL
ALK PHOS: 84 IU/L (ref 39–117)
ALT: 9 IU/L (ref 0–44)
AST: 20 IU/L (ref 0–40)
Albumin/Globulin Ratio: 2 (ref 1.2–2.2)
Albumin: 4.4 g/dL (ref 3.5–4.8)
BUN/Creatinine Ratio: 18 (ref 10–22)
BUN: 23 mg/dL (ref 8–27)
Bilirubin Total: 0.3 mg/dL (ref 0.0–1.2)
CO2: 24 mmol/L (ref 18–29)
CREATININE: 1.3 mg/dL — AB (ref 0.76–1.27)
Calcium: 10.4 mg/dL — ABNORMAL HIGH (ref 8.6–10.2)
Chloride: 98 mmol/L (ref 96–106)
GFR calc Af Amer: 62 mL/min/{1.73_m2} (ref >59)
GFR calc non Af Amer: 54 mL/min/{1.73_m2} — ABNORMAL LOW (ref >59)
GLOBULIN, TOTAL: 2.2 g/dL (ref 1.5–4.5)
Glucose: 118 mg/dL — ABNORMAL HIGH (ref 65–99)
POTASSIUM: 3.8 mmol/L (ref 3.5–5.2)
SODIUM: 140 mmol/L (ref 134–144)
Total Protein: 6.6 g/dL (ref 6.0–8.5)

## 2015-12-02 LAB — CBC WITH DIFFERENTIAL/PLATELET
BASOS: 2 %
Basophils Absolute: 0.1 10*3/uL (ref 0.0–0.2)
EOS (ABSOLUTE): 0.5 10*3/uL — ABNORMAL HIGH (ref 0.0–0.4)
Eos: 9 %
HEMOGLOBIN: 12.2 g/dL — AB (ref 12.6–17.7)
Hematocrit: 35.2 % — ABNORMAL LOW (ref 37.5–51.0)
IMMATURE GRANS (ABS): 0 10*3/uL (ref 0.0–0.1)
Immature Granulocytes: 0 %
LYMPHS: 16 %
Lymphocytes Absolute: 0.9 10*3/uL (ref 0.7–3.1)
MCH: 29.3 pg (ref 26.6–33.0)
MCHC: 34.7 g/dL (ref 31.5–35.7)
MCV: 85 fL (ref 79–97)
MONOCYTES: 7 %
Monocytes Absolute: 0.4 10*3/uL (ref 0.1–0.9)
NEUTROS ABS: 3.6 10*3/uL (ref 1.4–7.0)
Neutrophils: 66 %
Platelets: 303 10*3/uL (ref 150–379)
RBC: 4.16 x10E6/uL (ref 4.14–5.80)
RDW: 14.9 % (ref 12.3–15.4)
WBC: 5.5 10*3/uL (ref 3.4–10.8)

## 2015-12-02 LAB — TSH: TSH: 1.97 u[IU]/mL (ref 0.450–4.500)

## 2015-12-02 LAB — LIPID PANEL
CHOLESTEROL TOTAL: 118 mg/dL (ref 100–199)
Chol/HDL Ratio: 2.9 ratio units (ref 0.0–5.0)
HDL: 41 mg/dL (ref >39)
LDL Calculated: 45 mg/dL (ref 0–99)
TRIGLYCERIDES: 159 mg/dL — AB (ref 0–149)
VLDL Cholesterol Cal: 32 mg/dL (ref 5–40)

## 2015-12-05 ENCOUNTER — Telehealth: Payer: Self-pay | Admitting: Family Medicine

## 2015-12-05 DIAGNOSIS — D649 Anemia, unspecified: Secondary | ICD-10-CM

## 2015-12-05 NOTE — Telephone Encounter (Signed)
Phone call Discussed with patient mild anemia will recheck CBC to further assess

## 2015-12-05 NOTE — Telephone Encounter (Signed)
-----   Message from Wynn Maudlin, [place] sent at 12/05/2015  4:57 PM EDT ----- labs

## 2015-12-06 ENCOUNTER — Telehealth: Payer: Self-pay | Admitting: Family Medicine

## 2015-12-06 DIAGNOSIS — I10 Essential (primary) hypertension: Secondary | ICD-10-CM

## 2015-12-06 MED ORDER — POTASSIUM CHLORIDE ER 10 MEQ PO TBCR: 10.0000 meq | EXTENDED_RELEASE_TABLET | Freq: Every day | ORAL | Status: DC

## 2015-12-06 NOTE — Telephone Encounter (Signed)
Pharmacy called and would like clarification on potassium rx.... Tablets or packets?

## 2015-12-07 ENCOUNTER — Other Ambulatory Visit: Payer: Commercial Managed Care - HMO

## 2015-12-07 ENCOUNTER — Other Ambulatory Visit: Payer: Self-pay | Admitting: Family Medicine

## 2015-12-07 DIAGNOSIS — D649 Anemia, unspecified: Secondary | ICD-10-CM

## 2015-12-08 LAB — CBC WITH DIFFERENTIAL/PLATELET
BASOS: 2 %
Basophils Absolute: 0.1 10*3/uL (ref 0.0–0.2)
EOS (ABSOLUTE): 0.5 10*3/uL — AB (ref 0.0–0.4)
Eos: 9 %
Hematocrit: 36.5 % — ABNORMAL LOW (ref 37.5–51.0)
Hemoglobin: 12.1 g/dL — ABNORMAL LOW (ref 12.6–17.7)
IMMATURE GRANULOCYTES: 0 %
Immature Grans (Abs): 0 10*3/uL (ref 0.0–0.1)
Lymphocytes Absolute: 0.9 10*3/uL (ref 0.7–3.1)
Lymphs: 16 %
MCH: 28.5 pg (ref 26.6–33.0)
MCHC: 33.2 g/dL (ref 31.5–35.7)
MCV: 86 fL (ref 79–97)
MONOS ABS: 0.3 10*3/uL (ref 0.1–0.9)
Monocytes: 5 %
NEUTROS ABS: 4 10*3/uL (ref 1.4–7.0)
NEUTROS PCT: 68 %
PLATELETS: 246 10*3/uL (ref 150–379)
RBC: 4.24 x10E6/uL (ref 4.14–5.80)
RDW: 14.8 % (ref 12.3–15.4)
WBC: 5.9 10*3/uL (ref 3.4–10.8)

## 2015-12-12 ENCOUNTER — Other Ambulatory Visit: Payer: Self-pay | Admitting: Family Medicine

## 2015-12-12 ENCOUNTER — Telehealth: Payer: Self-pay | Admitting: Family Medicine

## 2015-12-12 DIAGNOSIS — D649 Anemia, unspecified: Secondary | ICD-10-CM

## 2015-12-12 NOTE — Telephone Encounter (Signed)
-----   Message from Wynn Maudlin, [place] sent at 12/12/2015 12:09 PM EDT ----- labs

## 2015-12-12 NOTE — Telephone Encounter (Signed)
Phone call Discussed with patient hemoglobin steady below will make referral to gastroenterology to further evaluate Will check iron total iron-binding and ferritin

## 2015-12-13 ENCOUNTER — Other Ambulatory Visit: Payer: Commercial Managed Care - HMO

## 2015-12-13 ENCOUNTER — Telehealth: Payer: Self-pay | Admitting: Family Medicine

## 2015-12-13 DIAGNOSIS — D649 Anemia, unspecified: Secondary | ICD-10-CM

## 2015-12-13 NOTE — Telephone Encounter (Signed)
Pt came in with paperwork from his pharmacy stating they can not refill his prescriptions because: Provider Clarification is Needed. Please contact [facility] @ [phone] in regards to the following prescriptions:  Potassium Glyburide Amlodipine   Thanks.

## 2015-12-14 LAB — RETICULOCYTES: Retic Ct Pct: 1.6 % (ref 0.6–2.6)

## 2015-12-14 LAB — IRON AND TIBC
IRON: 66 ug/dL (ref 38–169)
Iron Saturation: 15 % (ref 15–55)
Total Iron Binding Capacity: 443 ug/dL (ref 250–450)
UIBC: 377 ug/dL — AB (ref 111–343)

## 2015-12-14 LAB — FERRITIN: Ferritin: 34 ng/mL (ref 30–400)

## 2015-12-14 NOTE — Telephone Encounter (Signed)
-----   Message from Wynn Maudlin, [place] sent at 12/14/2015 12:00 PM EDT ----- labs

## 2015-12-14 NOTE — Telephone Encounter (Signed)
Phone call Discussed with patient anemia tending towards iron deficiency will refer to gastroenterology patient prefers [facility].

## 2015-12-29 MED ORDER — POTASSIUM CHLORIDE ER 20 MEQ PO TBCR: 20.0000 meq | EXTENDED_RELEASE_TABLET | Freq: Every day | ORAL | Status: DC

## 2015-12-29 NOTE — Telephone Encounter (Signed)
Called pharmacy. They stated that historically, patient has been on potassium 20 meq tablet ER in the past and the rx sent in on 12/06/15 was for 10 meq. Was the decrease in dosage on purpose?

## 2015-12-29 NOTE — Telephone Encounter (Signed)
Pt came in with a letter saying Ryan Kirby never got clarification on his potassium.

## 2015-12-29 NOTE — Addendum Note (Signed)
Addended byGolden Pop on: 12/29/2015 11:55 AM   Modules accepted: Orders

## 2016-01-03 DIAGNOSIS — D500 Iron deficiency anemia secondary to blood loss (chronic): Secondary | ICD-10-CM | POA: Diagnosis not present

## 2016-01-03 DIAGNOSIS — Z800 Family history of malignant neoplasm of digestive organs: Secondary | ICD-10-CM | POA: Diagnosis not present

## 2016-01-13 DIAGNOSIS — D500 Iron deficiency anemia secondary to blood loss (chronic): Secondary | ICD-10-CM | POA: Diagnosis not present

## 2016-01-25 DIAGNOSIS — H5210 Myopia, unspecified eye: Secondary | ICD-10-CM | POA: Diagnosis not present

## 2016-01-25 DIAGNOSIS — H524 Presbyopia: Secondary | ICD-10-CM | POA: Diagnosis not present

## 2016-01-25 LAB — HM DIABETES EYE EXAM

## 2016-02-02 DIAGNOSIS — D509 Iron deficiency anemia, unspecified: Secondary | ICD-10-CM | POA: Diagnosis not present

## 2016-02-03 DIAGNOSIS — I10 Essential (primary) hypertension: Secondary | ICD-10-CM | POA: Diagnosis not present

## 2016-02-03 DIAGNOSIS — N183 Chronic kidney disease, stage 3 (moderate): Secondary | ICD-10-CM | POA: Diagnosis not present

## 2016-02-03 DIAGNOSIS — N2581 Secondary hyperparathyroidism of renal origin: Secondary | ICD-10-CM | POA: Diagnosis not present

## 2016-02-03 DIAGNOSIS — E876 Hypokalemia: Secondary | ICD-10-CM | POA: Diagnosis not present

## 2016-03-13 ENCOUNTER — Encounter: Payer: Self-pay | Admitting: Unknown Physician Specialty

## 2016-03-13 ENCOUNTER — Ambulatory Visit (INDEPENDENT_AMBULATORY_CARE_PROVIDER_SITE_OTHER): Payer: Commercial Managed Care - HMO | Admitting: Unknown Physician Specialty

## 2016-03-13 VITALS — BP 110/64 | HR 70 | Temp 98.0°F | Ht 70.1 in | Wt 196.6 lb

## 2016-03-13 DIAGNOSIS — J069 Acute upper respiratory infection, unspecified: Secondary | ICD-10-CM

## 2016-03-13 MED ORDER — AMOXICILLIN-POT CLAVULANATE 875-125 MG PO TABS
1.0000 | ORAL_TABLET | Freq: Two times a day (BID) | ORAL | Status: DC
Start: 2016-03-13 — End: 2016-03-29

## 2016-03-13 MED ORDER — BENZONATATE 100 MG PO CAPS: 100.0000 mg | ORAL_CAPSULE | Freq: Three times a day (TID) | ORAL | Status: DC | PRN

## 2016-03-13 NOTE — Progress Notes (Signed)
   BP 110/64 mmHg  Pulse 70  Temp(Src) 98 F (36.7 C)  Ht 5' 10.1" (1.781 m)  Wt 196 lb 9.6 oz (89.177 kg)  BMI 28.11 kg/m2  SpO2 97%   Subjective:    Patient ID: Ryan Kirby, male    DOB: 21-Nov-1940, 75 y.o.   MRN: [id number]  HPI: Ryan Kirby is a 75 y.o. male  Chief Complaint  Patient presents with  . URI    pt states he has had watery eyes, runny nose, cough, and scratchy throat. States symptoms started Saturday.    URI  This is a new problem. Episode onset: 3 days. The problem has been unchanged. There has been no fever. Associated symptoms include congestion, coughing and rhinorrhea. Pertinent negatives include no abdominal pain, chest pain or joint pain. He has tried antihistamine and decongestant for the symptoms. The treatment provided no relief.     Relevant past medical, surgical, family and social history reviewed and updated as indicated. Interim medical history since our last visit reviewed. Allergies and medications reviewed and updated.  Review of Systems  HENT: Positive for congestion and rhinorrhea.   Respiratory: Positive for cough.   Cardiovascular: Negative for chest pain.  Gastrointestinal: Negative for abdominal pain.  Musculoskeletal: Negative for joint pain.    Per HPI unless specifically indicated above     Objective:    BP 110/64 mmHg  Pulse 70  Temp(Src) 98 F (36.7 C)  Ht 5' 10.1" (1.781 m)  Wt 196 lb 9.6 oz (89.177 kg)  BMI 28.11 kg/m2  SpO2 97%  Wt Readings from Last 3 Encounters:  03/13/16 196 lb 9.6 oz (89.177 kg)  12/01/15 193 lb (87.544 kg)  11/15/15 195 lb (88.451 kg)    Physical Exam  Constitutional: He is oriented to person, place, and time. He appears well-developed and well-nourished. No distress.  HENT:  Head: Normocephalic and atraumatic.  Right Ear: Tympanic membrane and ear canal normal.  Left Ear: Tympanic membrane and ear canal normal.  Nose: Rhinorrhea present. Right sinus exhibits no maxillary sinus  tenderness and no frontal sinus tenderness. Left sinus exhibits no maxillary sinus tenderness and no frontal sinus tenderness.  Mouth/Throat: Uvula is midline. Posterior oropharyngeal edema present.  Eyes: Conjunctivae and lids are normal. Right eye exhibits no discharge. Left eye exhibits no discharge. No scleral icterus.  Neck: Neck supple.  Cardiovascular: Normal rate, regular rhythm and normal heart sounds.   Pulmonary/Chest: Effort normal and breath sounds normal. No respiratory distress.  Abdominal: Normal appearance. There is no splenomegaly or hepatomegaly.  Musculoskeletal: Normal range of motion.  Neurological: He is alert and oriented to person, place, and time.  Skin: Skin is warm, dry and intact. No rash noted. No pallor.  Psychiatric: He has a normal mood and affect. His behavior is normal. Judgment and thought content normal.  Nursing note and vitals reviewed.     Assessment & Plan:   Problem List Items Addressed This Visit    None    Visit Diagnoses    URI (upper respiratory infection)    -  Primary    Recommended supportive care.  Pt became adament and demanded an antibiotic.  Will rx Augmentin and Tessalon Perles        Follow up plan: Return if symptoms worsen or fail to improve.

## 2016-03-18 ENCOUNTER — Ambulatory Visit
Admission: EM | Admit: 2016-03-18 | Discharge: 2016-03-18 | Disposition: A | Discharge status: Home / Self Care | Payer: Commercial Managed Care - HMO | Attending: Family Medicine | Admitting: Family Medicine

## 2016-03-18 ENCOUNTER — Encounter: Payer: Self-pay | Admitting: Emergency Medicine

## 2016-03-18 DIAGNOSIS — J069 Acute upper respiratory infection, unspecified: Secondary | ICD-10-CM | POA: Diagnosis not present

## 2016-03-18 MED ORDER — HYDROCOD POLST-CPM POLST ER 10-8 MG/5ML PO SUER: 5.0000 mL | Freq: Two times a day (BID) | ORAL | Status: DC | PRN

## 2016-03-18 NOTE — ED Notes (Signed)
Patient c/o cough, chest congestion, and runny nose for over a week.  Patient denies fevers. Patient has abscess on the his back.

## 2016-03-18 NOTE — Discharge Instructions (Signed)
This is viral and not from a tick.  Use the cough medicine as needed.  Follow up with your PCP if you fail to improve or worsen.   Upper Respiratory Infection, Adult Most upper respiratory infections (URIs) are a viral infection of the air passages leading to the lungs. A URI affects the nose, throat, and upper air passages. The most common type of URI is nasopharyngitis and is typically referred to as "the common cold." URIs run their course and usually go away on their own. Most of the time, a URI does not require medical attention, but sometimes a bacterial infection in the upper airways can follow a viral infection. This is called a secondary infection. Sinus and middle ear infections are common types of secondary upper respiratory infections. Bacterial pneumonia can also complicate a URI. A URI can worsen asthma and chronic obstructive pulmonary disease (COPD). Sometimes, these complications can require emergency medical care and may be life threatening.  CAUSES Almost all URIs are caused by viruses. A virus is a type of germ and can spread from one person to another.  RISKS FACTORS You may be at risk for a URI if:   You smoke.   You have chronic heart or lung disease.  You have a weakened defense (immune) system.   You are very young or very old.   You have nasal allergies or asthma.  You work in crowded or poorly ventilated areas.  You work in health care facilities or schools. SIGNS AND SYMPTOMS  Symptoms typically develop 2-3 days after you come in contact with a cold virus. Most viral URIs last 7-10 days. However, viral URIs from the influenza virus (flu virus) can last 14-18 days and are typically more severe. Symptoms may include:   Runny or stuffy (congested) nose.   Sneezing.   Cough.   Sore throat.   Headache.   Fatigue.   Fever.   Loss of appetite.   Pain in your forehead, behind your eyes, and over your cheekbones (sinus pain).  Muscle  aches.  DIAGNOSIS  Your health care provider may diagnose a URI by:  Physical exam.  Tests to check that your symptoms are not due to another condition such as:  Strep throat.  Sinusitis.  Pneumonia.  Asthma. TREATMENT  A URI goes away on its own with time. It cannot be cured with medicines, but medicines may be prescribed or recommended to relieve symptoms. Medicines may help:  Reduce your fever.  Reduce your cough.  Relieve nasal congestion. HOME CARE INSTRUCTIONS   Take medicines only as directed by your health care provider.   Gargle warm saltwater or take cough drops to comfort your throat as directed by your health care provider.  Use a warm mist humidifier or inhale steam from a shower to increase air moisture. This may make it easier to breathe.  Drink enough fluid to keep your urine clear or pale yellow.   Eat soups and other clear broths and maintain good nutrition.   Rest as needed.   Return to work when your temperature has returned to normal or as your health care provider advises. You may need to stay home longer to avoid infecting others. You can also use a face mask and careful hand washing to prevent spread of the virus.  Increase the usage of your inhaler if you have asthma.   Do not use any tobacco products, including cigarettes, chewing tobacco, or electronic cigarettes. If you need help quitting, ask your health  care provider. PREVENTION  The best way to protect yourself from getting a cold is to practice good hygiene.   Avoid oral or hand contact with people with cold symptoms.   Wash your hands often if contact occurs.  There is no clear evidence that vitamin C, vitamin E, echinacea, or exercise reduces the chance of developing a cold. However, it is always recommended to get plenty of rest, exercise, and practice good nutrition.  SEEK MEDICAL CARE IF:   You are getting worse rather than better.   Your symptoms are not controlled by  medicine.   You have chills.  You have worsening shortness of breath.  You have brown or red mucus.  You have yellow or brown nasal discharge.  You have pain in your face, especially when you bend forward.  You have a fever.  You have swollen neck glands.  You have pain while swallowing.  You have white areas in the back of your throat. SEEK IMMEDIATE MEDICAL CARE IF:   You have severe or persistent:  Headache.  Ear pain.  Sinus pain.  Chest pain.  You have chronic lung disease and any of the following:  Wheezing.  Prolonged cough.  Coughing up blood.  A change in your usual mucus.  You have a stiff neck.  You have changes in your:  Vision.  Hearing.  Thinking.  Mood. MAKE SURE YOU:   Understand these instructions.  Will watch your condition.  Will get help right away if you are not doing well or get worse.   This information is not intended to replace advice given to you by your health care provider. Make sure you discuss any questions you have with your health care provider.   Document Released: 02/13/2001 Document Revised: 01/04/2015 Document Reviewed: 11/25/2013 Elsevier Interactive Patient Education [employer].

## 2016-03-18 NOTE — ED Provider Notes (Signed)
CSN: [id number]     Arrival date & time 03/18/16  0815 History   First MD Initiated Contact with Patient 03/18/16 0845     Chief Complaint  Patient presents with  . Cough  . Nasal Congestion   (Consider location/radiation/quality/duration/timing/severity/associated sxs/prior Treatment) HPI  75 year old male with a past medical history of CAD, hypertension, hyperlipidemia, DM 2, CKD presents with the above complaints.  Patient states that he's been sick for the past week. He's been experiencing cough, runny nose, and watery eyes. He also reports decreased appetite and fatigue. No associated fevers or chills. No shortness of breath. He was recent seen by his primary care physician's office on 7/11. At that time he was told that this was a viral upper respiratory infection. The note reflects that he became angry and was adamant about antibiotics. He was given a prescription for Augmentin which he has been taking. He continues to have a nonproductive cough. He states that he just generally doesn't feel well. No known exacerbating factors. No relieving factors. No other complaints at this time.  Past Medical History  Diagnosis Date  . Allergy   . CAD (coronary artery disease)   . Hyperlipidemia   . Hypertension   . Diabetes mellitus without complication ([place])   . Chronic kidney disease     pt is not on dialysis  . GERD (gastroesophageal reflux disease)    Past Surgical History  Procedure Laterality Date  . Coronary artery bypass graft    . Finger amputation Left 2011    5th  . Bowel resection  2004  . Knee arthroscopy Right 09/12/2015    Procedure: ARTHROSCOPY KNEE, LATERAL MENISECTOMY, CHONDROPLASTY;  Surgeon: Dereck Leep, MD;  Location: [facility] ORS;  Service: Orthopedics;  Laterality: Right;   Family History  Problem Relation Age of Onset  . Cancer Mother     breast  . Arthritis Mother   . Hypertension Mother   . Cancer Father     colon  . Arthritis Father   . Hypertension  Father    Social History  Substance Use Topics  . Smoking status: Never Smoker   . Smokeless tobacco: Never Used  . Alcohol Use: No    Review of Systems  Constitutional: Positive for appetite change and fatigue.  HENT: Positive for rhinorrhea.   Eyes:       Watery eyes.  Respiratory: Positive for cough.   All other systems reviewed and are negative.   Allergies  Oxycontin  Home Medications   Prior to Admission medications   Medication Sig Start Date End Date Taking? Authorizing Provider  amLODipine (NORVASC) 10 MG tablet TAKE 1 TABLET ONE TIME DAILY 12/08/15   Megan P Johnson, DO  amoxicillin-clavulanate (AUGMENTIN) 875-125 MG tablet Take 1 tablet by mouth 2 (two) times daily. 03/13/16   Kathrine Haddock, NP  benazepril-hydrochlorthiazide (LOTENSIN HCT) 20-12.5 MG tablet TAKE 1 TABLET EVERY DAY 12/08/15   Megan P Johnson, DO  benzonatate (TESSALON PERLES) 100 MG capsule Take 1 capsule (100 mg total) by mouth 3 (three) times daily as needed for cough. 03/13/16   Kathrine Haddock, NP  carvedilol (COREG) 25 MG tablet Take 2 tablets (50 mg total) by mouth 2 (two) times daily. 12/01/15   Guadalupe Maple, MD  Cetirizine HCl 10 MG CAPS Take 1 capsule by mouth every morning.    Historical Provider, MD  chlorpheniramine-HYDROcodone (TUSSIONEX PENNKINETIC ER) 10-8 MG/5ML SUER Take 5 mLs by mouth every 12 (twelve) hours as needed for cough. 03/18/16  Coral Spikes, DO  cloNIDine (CATAPRES) 0.3 MG tablet TAKE 1 TABLET TWICE DAILY 12/08/15   Megan P Johnson, DO  doxazosin (CARDURA) 2 MG tablet Take 2 mg by mouth every morning.     Historical Provider, MD  esomeprazole (NEXIUM) 20 MG capsule Take 20 mg by mouth daily. Reported on 09/12/2015 04/25/15   Historical Provider, MD  fluticasone (FLONASE) 50 MCG/ACT nasal spray daily as needed. 02/04/15   Historical Provider, MD  furosemide (LASIX) 40 MG tablet Take 1 tablet (40 mg total) by mouth daily. 12/01/15   Guadalupe Maple, MD  Glucosamine Sulfate 1000 MG TABS Take  1 tablet by mouth every morning.    Historical Provider, MD  glyBURIDE (DIABETA) 5 MG tablet Take 1 tablet (5 mg total) by mouth every morning. 12/01/15   Guadalupe Maple, MD  metFORMIN (GLUCOPHAGE) 500 MG tablet TAKE 1 TABLET TWICE DAILY 12/08/15   Megan P Johnson, DO  potassium chloride 20 MEQ TBCR Take 20 mEq by mouth daily. 12/29/15   Guadalupe Maple, MD  pravastatin (PRAVACHOL) 40 MG tablet Take 1 tablet (40 mg total) by mouth at bedtime. 12/01/15   Guadalupe Maple, MD   Meds Ordered and Administered this Visit  Medications - No data to display  BP 134/100 mmHg  Pulse 81  Temp(Src) 97.2 F (36.2 C) (Tympanic)  Resp 16  Ht 5\' 10"  (1.778 m)  Wt 196 lb (88.905 kg)  BMI 28.12 kg/m2  SpO2 97% No data found.  Physical Exam  Constitutional: He is oriented to person, place, and time. He appears well-developed. No distress.  HENT:  Head: Normocephalic and atraumatic.  Mouth/Throat: Oropharynx is clear and moist.  Eyes: Conjunctivae are normal. Right eye exhibits no discharge. Left eye exhibits no discharge. No scleral icterus.  Neck: Normal range of motion.  Cardiovascular: Normal rate and regular rhythm.   2/6 systolic murmur.  Pulmonary/Chest: Effort normal. He has no wheezes. He has no rales.  Abdominal: Soft. He exhibits no distension. There is no tenderness.  Musculoskeletal: Normal range of motion.  Neurological: He is alert and oriented to person, place, and time.  Skin: Skin is warm and dry.  R thoracic region - small raised area with erythema. No drainage. No fluctuance.  Psychiatric: He has a normal mood and affect.  Vitals reviewed.  ED Course  Procedures (including critical care time)  MDM   1. URI (upper respiratory infection)    75 year old male presents with signs and symptoms of an upper respiratory infection. No indication for change or additional antibiotics. He is already on antibiotics which he likely does not need. Exam unremarkable. Treating cough with  Tussionex. Supportive care.     Coral Spikes, DO 03/18/16 [phone]

## 2016-03-29 ENCOUNTER — Ambulatory Visit (INDEPENDENT_AMBULATORY_CARE_PROVIDER_SITE_OTHER): Payer: Commercial Managed Care - HMO | Admitting: Family Medicine

## 2016-03-29 ENCOUNTER — Encounter: Payer: Self-pay | Admitting: Family Medicine

## 2016-03-29 VITALS — BP 133/73 | HR 74 | Temp 97.9°F | Ht 71.0 in | Wt 191.0 lb

## 2016-03-29 DIAGNOSIS — I10 Essential (primary) hypertension: Secondary | ICD-10-CM | POA: Diagnosis not present

## 2016-03-29 DIAGNOSIS — M722 Plantar fascial fibromatosis: Secondary | ICD-10-CM

## 2016-03-29 DIAGNOSIS — E119 Type 2 diabetes mellitus without complications: Secondary | ICD-10-CM

## 2016-03-29 LAB — BAYER DCA HB A1C WAIVED: HB A1C (BAYER DCA - WAIVED): 6.8 % (ref <7.0)

## 2016-03-29 NOTE — Assessment & Plan Note (Signed)
Discussed care and treatment inserts stretching not going barefooted new shoes

## 2016-03-29 NOTE — Assessment & Plan Note (Signed)
The current medical regimen is effective;  continue present plan and medications.  

## 2016-03-29 NOTE — Progress Notes (Signed)
BP 133/73 (BP Location: Left Arm, Patient Position: Sitting, Cuff Size: Normal)   Pulse 74   Temp 97.9 F (36.6 C)   Ht 5\' 11"  (1.803 m)   Wt 191 lb (86.6 kg)   SpO2 99%   BMI 26.64 kg/m    Subjective:    Patient ID: Ryan Kirby, male    DOB: May 23, 1941, 75 y.o.   MRN: [id number]  HPI: Ryan Kirby is a 75 y.o. male  Chief Complaint  Patient presents with  . Diabetes  . Foot Pain    rt foot  Patient follow-up doing well sinus infection from emergency room in visit here is better. Patient with complaints of classic plantar fasciitis symptoms has had inserts in the past discussed plantar fasciitis care and treatment not going barefooted new shoes etc. Agents diabetes doing well with no low blood sugar spells no complaints from medications no side effects takes medicines faithfully  Relevant past medical, surgical, family and social history reviewed and updated as indicated. Interim medical history since our last visit reviewed. Allergies and medications reviewed and updated.  Review of Systems  Constitutional: Negative.   Respiratory: Negative.   Cardiovascular: Negative.     Per HPI unless specifically indicated above     Objective:    BP 133/73 (BP Location: Left Arm, Patient Position: Sitting, Cuff Size: Normal)   Pulse 74   Temp 97.9 F (36.6 C)   Ht 5\' 11"  (1.803 m)   Wt 191 lb (86.6 kg)   SpO2 99%   BMI 26.64 kg/m   Wt Readings from Last 3 Encounters:  03/29/16 191 lb (86.6 kg)  03/18/16 196 lb (88.9 kg)  03/13/16 196 lb 9.6 oz (89.2 kg)    Physical Exam  Constitutional: He is oriented to person, place, and time. He appears well-developed and well-nourished. No distress.  HENT:  Head: Normocephalic and atraumatic.  Right Ear: Hearing normal.  Left Ear: Hearing normal.  Nose: Nose normal.  Eyes: Conjunctivae and lids are normal. Right eye exhibits no discharge. Left eye exhibits no discharge. No scleral icterus.  Cardiovascular: Normal rate,  regular rhythm and normal heart sounds.   Pulmonary/Chest: Effort normal and breath sounds normal. No respiratory distress.  Musculoskeletal: Normal range of motion.  Changes of plantar fasciitis right heel with tenderness on palpation  Neurological: He is alert and oriented to person, place, and time.  Skin: Skin is intact. No rash noted.  Psychiatric: He has a normal mood and affect. His speech is normal and behavior is normal. Judgment and thought content normal. Cognition and memory are normal.    Results for orders placed or performed in visit on 03/22/16  HM DIABETES EYE EXAM  Result Value Ref Range   HM Diabetic Eye Exam Retinopathy (A) No Retinopathy      Assessment & Plan:   Problem List Items Addressed This Visit      Cardiovascular and Mediastinum   Essential hypertension    The current medical regimen is effective;  continue present plan and medications.         Endocrine   Diabetes mellitus without complication ([place]) - Primary    The current medical regimen is effective;  continue present plan and medications.       Relevant Orders   Bayer DCA Hb A1c Waived     Musculoskeletal and Integument   Plantar fasciitis of right foot    Discussed care and treatment inserts stretching not going barefooted new shoes  Relevant Orders   Ambulatory referral to Podiatry    Other Visit Diagnoses   None.      Follow up plan: Return in about 3 months (around 06/29/2016), or if symptoms worsen or fail to improve, for BMP, lipids, ALT, AST, hemoglobin A1c.

## 2016-04-13 DIAGNOSIS — E119 Type 2 diabetes mellitus without complications: Secondary | ICD-10-CM | POA: Diagnosis not present

## 2016-04-13 DIAGNOSIS — M722 Plantar fascial fibromatosis: Secondary | ICD-10-CM | POA: Diagnosis not present

## 2016-04-23 ENCOUNTER — Other Ambulatory Visit: Payer: Self-pay | Admitting: Family Medicine

## 2016-04-23 NOTE — Telephone Encounter (Signed)
Your patient 

## 2016-04-28 ENCOUNTER — Other Ambulatory Visit: Payer: Self-pay | Admitting: Family Medicine

## 2016-04-28 DIAGNOSIS — E119 Type 2 diabetes mellitus without complications: Secondary | ICD-10-CM

## 2016-04-30 ENCOUNTER — Other Ambulatory Visit: Payer: Self-pay | Admitting: Family Medicine

## 2016-04-30 DIAGNOSIS — E119 Type 2 diabetes mellitus without complications: Secondary | ICD-10-CM

## 2016-05-04 DIAGNOSIS — M722 Plantar fascial fibromatosis: Secondary | ICD-10-CM | POA: Diagnosis not present

## 2016-05-10 DIAGNOSIS — Z86010 Personal history of colonic polyps: Secondary | ICD-10-CM | POA: Diagnosis not present

## 2016-05-10 DIAGNOSIS — Z800 Family history of malignant neoplasm of digestive organs: Secondary | ICD-10-CM | POA: Diagnosis not present

## 2016-05-10 DIAGNOSIS — D509 Iron deficiency anemia, unspecified: Secondary | ICD-10-CM | POA: Diagnosis not present

## 2016-05-21 DIAGNOSIS — N183 Chronic kidney disease, stage 3 (moderate): Secondary | ICD-10-CM | POA: Diagnosis not present

## 2016-05-21 DIAGNOSIS — R600 Localized edema: Secondary | ICD-10-CM | POA: Diagnosis not present

## 2016-05-21 DIAGNOSIS — N2581 Secondary hyperparathyroidism of renal origin: Secondary | ICD-10-CM | POA: Diagnosis not present

## 2016-05-21 DIAGNOSIS — I10 Essential (primary) hypertension: Secondary | ICD-10-CM | POA: Diagnosis not present

## 2016-06-02 ENCOUNTER — Other Ambulatory Visit: Payer: Self-pay | Admitting: Family Medicine

## 2016-06-02 DIAGNOSIS — E119 Type 2 diabetes mellitus without complications: Secondary | ICD-10-CM

## 2016-06-11 DIAGNOSIS — M722 Plantar fascial fibromatosis: Secondary | ICD-10-CM | POA: Diagnosis not present

## 2016-07-02 ENCOUNTER — Other Ambulatory Visit: Payer: Self-pay | Admitting: Family Medicine

## 2016-07-02 DIAGNOSIS — E119 Type 2 diabetes mellitus without complications: Secondary | ICD-10-CM | POA: Diagnosis not present

## 2016-07-02 DIAGNOSIS — M722 Plantar fascial fibromatosis: Secondary | ICD-10-CM | POA: Diagnosis not present

## 2016-07-02 NOTE — Telephone Encounter (Signed)
Routing to provider  

## 2016-07-17 ENCOUNTER — Ambulatory Visit (INDEPENDENT_AMBULATORY_CARE_PROVIDER_SITE_OTHER): Payer: Commercial Managed Care - HMO | Admitting: Family Medicine

## 2016-07-17 ENCOUNTER — Encounter: Payer: Self-pay | Admitting: Family Medicine

## 2016-07-17 VITALS — BP 132/62 | HR 64 | Temp 97.6°F | Wt 200.0 lb

## 2016-07-17 DIAGNOSIS — E782 Mixed hyperlipidemia: Secondary | ICD-10-CM | POA: Diagnosis not present

## 2016-07-17 DIAGNOSIS — I2510 Atherosclerotic heart disease of native coronary artery without angina pectoris: Secondary | ICD-10-CM | POA: Diagnosis not present

## 2016-07-17 DIAGNOSIS — I2583 Coronary atherosclerosis due to lipid rich plaque: Secondary | ICD-10-CM

## 2016-07-17 DIAGNOSIS — Z23 Encounter for immunization: Secondary | ICD-10-CM | POA: Diagnosis not present

## 2016-07-17 DIAGNOSIS — E119 Type 2 diabetes mellitus without complications: Secondary | ICD-10-CM | POA: Diagnosis not present

## 2016-07-17 DIAGNOSIS — I10 Essential (primary) hypertension: Secondary | ICD-10-CM

## 2016-07-17 LAB — LP+ALT+AST PICCOLO, WAIVED
ALT (SGPT) Piccolo, Waived: 10 U/L (ref 10–47)
AST (SGOT) Piccolo, Waived: 22 U/L (ref 11–38)
Chol/HDL Ratio Piccolo,Waive: 2.8 mg/dL
Cholesterol Piccolo, Waived: 141 mg/dL (ref <200)
HDL CHOL PICCOLO, WAIVED: 50 mg/dL — AB (ref >59)
LDL Chol Calc Piccolo Waived: 52 mg/dL (ref <100)
TRIGLYCERIDES PICCOLO,WAIVED: 198 mg/dL — AB (ref <150)
VLDL Chol Calc Piccolo,Waive: 40 mg/dL — ABNORMAL HIGH (ref <30)

## 2016-07-17 LAB — BAYER DCA HB A1C WAIVED: HB A1C: 6.5 % (ref <7.0)

## 2016-07-17 LAB — MICROALBUMIN, URINE WAIVED
CREATININE, URINE WAIVED: 100 mg/dL (ref 10–300)
MICROALB, UR WAIVED: 10 mg/L (ref 0–19)
Microalb/Creat Ratio: <30 mg/g (ref <30)

## 2016-07-17 MED ORDER — CLONIDINE HCL 0.3 MG PO TABS
0.3000 mg | ORAL_TABLET | Freq: Two times a day (BID) | ORAL | 1 refills | Status: DC
Start: 2016-07-17 — End: 2016-12-06

## 2016-07-17 MED ORDER — GLYBURIDE 5 MG PO TABS: 5.0000 mg | ORAL_TABLET | Freq: Every day | ORAL | 1 refills | Status: DC

## 2016-07-17 NOTE — Assessment & Plan Note (Signed)
The current medical regimen is effective;  continue present plan and medications.  

## 2016-07-17 NOTE — Progress Notes (Signed)
BP 132/62 (BP Location: Left Arm)   Pulse 64   Temp 97.6 F (36.4 C)   Wt 200 lb (90.7 kg)   SpO2 99%   BMI 27.89 kg/m    Subjective:    Patient ID: Ryan Kirby, male    DOB: 02/20/1941, 75 y.o.   MRN: [id number]  HPI: Ryan Kirby is a 75 y.o. male  Chief Complaint  Patient presents with  . Diabetes  . Hypertension  . Hyperlipidemia   Patient follow-up medications conditions doing well with no low blood sugar spells no problems with diabetes blood pressure cholesterol medicines taken faithfully without side effects. Good reports from kidney doctor.  Relevant past medical, surgical, family and social history reviewed and updated as indicated. Interim medical history since our last visit reviewed. Allergies and medications reviewed and updated.  Review of Systems  Constitutional: Negative.   Respiratory: Negative.   Cardiovascular: Negative.     Per HPI unless specifically indicated above     Objective:    BP 132/62 (BP Location: Left Arm)   Pulse 64   Temp 97.6 F (36.4 C)   Wt 200 lb (90.7 kg)   SpO2 99%   BMI 27.89 kg/m   Wt Readings from Last 3 Encounters:  07/17/16 200 lb (90.7 kg)  03/29/16 191 lb (86.6 kg)  03/18/16 196 lb (88.9 kg)    Physical Exam  Constitutional: He is oriented to person, place, and time. He appears well-developed and well-nourished. No distress.  HENT:  Head: Normocephalic and atraumatic.  Right Ear: Hearing normal.  Left Ear: Hearing normal.  Nose: Nose normal.  Eyes: Conjunctivae and lids are normal. Right eye exhibits no discharge. Left eye exhibits no discharge. No scleral icterus.  Cardiovascular: Normal rate, regular rhythm and normal heart sounds.   Pulmonary/Chest: Effort normal and breath sounds normal. No respiratory distress.  Musculoskeletal: Normal range of motion.  Neurological: He is alert and oriented to person, place, and time.  Skin: Skin is intact. No rash noted.  Psychiatric: He has a normal mood and  affect. His speech is normal and behavior is normal. Judgment and thought content normal. Cognition and memory are normal.    Results for orders placed or performed in visit on 03/29/16  Bayer DCA Hb A1c Waived  Result Value Ref Range   Bayer DCA Hb A1c Waived 6.8 <7.0 %      Assessment & Plan:   Problem List Items Addressed This Visit      Cardiovascular and Mediastinum   Essential hypertension    The current medical regimen is effective;  continue present plan and medications.       Relevant Medications   cloNIDine (CATAPRES) 0.3 MG tablet   Other Relevant Orders   Basic metabolic panel   CAD (coronary artery disease)    The current medical regimen is effective;  continue present plan and medications.       Relevant Medications   cloNIDine (CATAPRES) 0.3 MG tablet     Endocrine   Diabetes mellitus without complication ([place]) - Primary    The current medical regimen is effective;  continue present plan and medications.       Relevant Medications   glyBURIDE (DIABETA) 5 MG tablet   Other Relevant Orders   Bayer DCA Hb A1c Waived   Microalbumin, Urine Waived (STAT)     Other   Hyperlipidemia    The current medical regimen is effective;  continue present plan and medications.  Relevant Medications   cloNIDine (CATAPRES) 0.3 MG tablet   Other Relevant Orders   LP+ALT+AST [place], Waived    Other Visit Diagnoses    Needs flu shot           Follow up plan: Return for Physical Exam, Hemoglobin A1c.

## 2016-07-18 ENCOUNTER — Encounter: Payer: Self-pay | Admitting: Family Medicine

## 2016-07-18 LAB — BASIC METABOLIC PANEL
BUN/Creatinine Ratio: 11 (ref 10–24)
BUN: 15 mg/dL (ref 8–27)
CALCIUM: 9.6 mg/dL (ref 8.6–10.2)
CHLORIDE: 100 mmol/L (ref 96–106)
CO2: 24 mmol/L (ref 18–29)
Creatinine, Ser: 1.38 mg/dL — ABNORMAL HIGH (ref 0.76–1.27)
GFR calc Af Amer: 58 mL/min/{1.73_m2} — ABNORMAL LOW (ref >59)
GFR calc non Af Amer: 50 mL/min/{1.73_m2} — ABNORMAL LOW (ref >59)
Glucose: 202 mg/dL — ABNORMAL HIGH (ref 65–99)
POTASSIUM: 3.6 mmol/L (ref 3.5–5.2)
Sodium: 142 mmol/L (ref 134–144)

## 2016-08-02 ENCOUNTER — Telehealth: Payer: Self-pay

## 2016-08-02 NOTE — Telephone Encounter (Signed)
Prior Authorization was denied for Gyburide, despite patient being on it for a year.  Anything else to prescribe or do an appeal?

## 2016-08-06 MED ORDER — GLIPIZIDE 5 MG PO TABS
5.0000 mg | ORAL_TABLET | Freq: Every day | ORAL | 5 refills | Status: DC
Start: 2016-08-06 — End: 2016-12-06

## 2016-08-06 NOTE — Telephone Encounter (Signed)
rx changed and sent

## 2016-09-15 ENCOUNTER — Other Ambulatory Visit: Payer: Self-pay | Admitting: Family Medicine

## 2016-09-17 NOTE — Telephone Encounter (Signed)
Routing to provider. Appt 12/06/16.

## 2016-10-03 DIAGNOSIS — R600 Localized edema: Secondary | ICD-10-CM | POA: Diagnosis not present

## 2016-10-03 DIAGNOSIS — N183 Chronic kidney disease, stage 3 (moderate): Secondary | ICD-10-CM | POA: Diagnosis not present

## 2016-10-03 DIAGNOSIS — N2581 Secondary hyperparathyroidism of renal origin: Secondary | ICD-10-CM | POA: Diagnosis not present

## 2016-10-03 DIAGNOSIS — I10 Essential (primary) hypertension: Secondary | ICD-10-CM | POA: Diagnosis not present

## 2016-12-06 ENCOUNTER — Ambulatory Visit (INDEPENDENT_AMBULATORY_CARE_PROVIDER_SITE_OTHER): Payer: Medicare HMO | Admitting: Family Medicine

## 2016-12-06 ENCOUNTER — Encounter: Payer: Self-pay | Admitting: Family Medicine

## 2016-12-06 VITALS — BP 138/62 | HR 71 | Ht 70.47 in | Wt 202.0 lb

## 2016-12-06 DIAGNOSIS — N183 Chronic kidney disease, stage 3 unspecified: Secondary | ICD-10-CM

## 2016-12-06 DIAGNOSIS — Z0000 Encounter for general adult medical examination without abnormal findings: Secondary | ICD-10-CM

## 2016-12-06 DIAGNOSIS — E1122 Type 2 diabetes mellitus with diabetic chronic kidney disease: Secondary | ICD-10-CM | POA: Diagnosis not present

## 2016-12-06 DIAGNOSIS — I2510 Atherosclerotic heart disease of native coronary artery without angina pectoris: Secondary | ICD-10-CM

## 2016-12-06 DIAGNOSIS — I10 Essential (primary) hypertension: Secondary | ICD-10-CM

## 2016-12-06 DIAGNOSIS — E782 Mixed hyperlipidemia: Secondary | ICD-10-CM

## 2016-12-06 DIAGNOSIS — E7800 Pure hypercholesterolemia, unspecified: Secondary | ICD-10-CM

## 2016-12-06 DIAGNOSIS — Z13220 Encounter for screening for lipoid disorders: Secondary | ICD-10-CM

## 2016-12-06 DIAGNOSIS — N400 Enlarged prostate without lower urinary tract symptoms: Secondary | ICD-10-CM | POA: Diagnosis not present

## 2016-12-06 DIAGNOSIS — Z1329 Encounter for screening for other suspected endocrine disorder: Secondary | ICD-10-CM | POA: Diagnosis not present

## 2016-12-06 DIAGNOSIS — K219 Gastro-esophageal reflux disease without esophagitis: Secondary | ICD-10-CM | POA: Diagnosis not present

## 2016-12-06 DIAGNOSIS — Z125 Encounter for screening for malignant neoplasm of prostate: Secondary | ICD-10-CM | POA: Diagnosis not present

## 2016-12-06 DIAGNOSIS — E119 Type 2 diabetes mellitus without complications: Secondary | ICD-10-CM

## 2016-12-06 DIAGNOSIS — I129 Hypertensive chronic kidney disease with stage 1 through stage 4 chronic kidney disease, or unspecified chronic kidney disease: Secondary | ICD-10-CM

## 2016-12-06 DIAGNOSIS — I2583 Coronary atherosclerosis due to lipid rich plaque: Secondary | ICD-10-CM

## 2016-12-06 LAB — URINALYSIS, ROUTINE W REFLEX MICROSCOPIC
Bilirubin, UA: NEGATIVE
Ketones, UA: NEGATIVE
LEUKOCYTES UA: NEGATIVE
Nitrite, UA: NEGATIVE
PROTEIN UA: NEGATIVE
RBC, UA: NEGATIVE
Specific Gravity, UA: <1.005 — ABNORMAL LOW (ref 1.005–1.030)
Urobilinogen, Ur: 0.2 mg/dL (ref 0.2–1.0)
pH, UA: 5 (ref 5.0–7.5)

## 2016-12-06 LAB — MICROSCOPIC EXAMINATION
Bacteria, UA: NONE SEEN
RBC MICROSCOPIC, UA: NONE SEEN /HPF (ref 0–?)
WBC, UA: NONE SEEN /hpf (ref 0–?)

## 2016-12-06 LAB — BAYER DCA HB A1C WAIVED: HB A1C: 6.9 % (ref <7.0)

## 2016-12-06 MED ORDER — METFORMIN HCL 500 MG PO TABS: 500.0000 mg | ORAL_TABLET | Freq: Two times a day (BID) | ORAL | 4 refills | Status: DC

## 2016-12-06 MED ORDER — PRAVASTATIN SODIUM 40 MG PO TABS: 40.0000 mg | ORAL_TABLET | Freq: Every day | ORAL | 4 refills | Status: DC

## 2016-12-06 MED ORDER — CARVEDILOL 25 MG PO TABS: 50.0000 mg | ORAL_TABLET | Freq: Two times a day (BID) | ORAL | 4 refills | Status: DC

## 2016-12-06 MED ORDER — CLONIDINE HCL 0.3 MG PO TABS: 0.3000 mg | ORAL_TABLET | Freq: Two times a day (BID) | ORAL | 4 refills | Status: DC

## 2016-12-06 MED ORDER — AMLODIPINE BESYLATE 10 MG PO TABS: 10.0000 mg | ORAL_TABLET | Freq: Every day | ORAL | 4 refills | Status: DC

## 2016-12-06 MED ORDER — POTASSIUM CHLORIDE ER 20 MEQ PO TBCR: 20.0000 meq | EXTENDED_RELEASE_TABLET | Freq: Every day | ORAL | 4 refills | Status: DC

## 2016-12-06 MED ORDER — DOXAZOSIN MESYLATE 2 MG PO TABS: 2.0000 mg | ORAL_TABLET | ORAL | 4 refills | Status: DC

## 2016-12-06 MED ORDER — BENAZEPRIL-HYDROCHLOROTHIAZIDE 20-12.5 MG PO TABS: 1.0000 | ORAL_TABLET | Freq: Every day | ORAL | 4 refills | Status: DC

## 2016-12-06 MED ORDER — GLIPIZIDE 5 MG PO TABS: 5.0000 mg | ORAL_TABLET | Freq: Every day | ORAL | 4 refills | Status: DC

## 2016-12-06 MED ORDER — FUROSEMIDE 40 MG PO TABS: 40.0000 mg | ORAL_TABLET | Freq: Every day | ORAL | 4 refills | Status: DC

## 2016-12-06 NOTE — Assessment & Plan Note (Signed)
The current medical regimen is effective;  continue present plan and medications.  

## 2016-12-06 NOTE — Progress Notes (Signed)
BP 138/62 (BP Location: Left Arm)   Pulse 71   Ht 5' 10.47" (1.79 m)   Wt 202 lb (91.6 kg)   SpO2 98%   BMI 28.60 kg/m    Subjective:    Patient ID: Ryan Kirby, male    DOB: 04/21/1941, 76 y.o.   MRN: [id number]  HPI: KRISTOFOR MICHALOWSKI is a 76 y.o. male  Chief Complaint  Patient presents with  . Annual Exam  AWV metrics met Patient follow-up hypertension all in all doing well clonidine causes a dry mouth and patient takes later in the day but all in all doing well. Pravastatin concerned may be causing some arthralgias as skipped a couple of days and seems to help maybe a week bit. Diabetes metformin no complaints takes without problems noted low blood sugar spells. Reflux controlled with Nexium.  Relevant past medical, surgical, family and social history reviewed and updated as indicated. Interim medical history since our last visit reviewed. Allergies and medications reviewed and updated.  Review of Systems  Constitutional: Negative.   HENT: Negative.   Eyes: Negative.   Respiratory: Negative.   Cardiovascular: Negative.   Gastrointestinal: Negative.   Endocrine: Negative.   Genitourinary: Negative.   Musculoskeletal: Negative.   Skin: Negative.   Allergic/Immunologic: Negative.   Neurological: Negative.   Hematological: Negative.   Psychiatric/Behavioral: Negative.     Per HPI unless specifically indicated above     Objective:    BP 138/62 (BP Location: Left Arm)   Pulse 71   Ht 5' 10.47" (1.79 m)   Wt 202 lb (91.6 kg)   SpO2 98%   BMI 28.60 kg/m   Wt Readings from Last 3 Encounters:  12/06/16 202 lb (91.6 kg)  07/17/16 200 lb (90.7 kg)  03/29/16 191 lb (86.6 kg)    Physical Exam  Constitutional: He is oriented to person, place, and time. He appears well-developed and well-nourished.  HENT:  Head: Normocephalic and atraumatic.  Right Ear: External ear normal.  Left Ear: External ear normal.  Eyes: Conjunctivae and EOM are normal. Pupils are equal,  round, and reactive to light.  Neck: Normal range of motion. Neck supple.  Cardiovascular: Normal rate, regular rhythm, normal heart sounds and intact distal pulses.   Pulmonary/Chest: Effort normal and breath sounds normal.  Abdominal: Soft. Bowel sounds are normal. There is no splenomegaly or hepatomegaly.  Genitourinary: Rectum normal and penis normal.  Genitourinary Comments: BPH changes  Musculoskeletal: Normal range of motion.  Neurological: He is alert and oriented to person, place, and time. He has normal reflexes.  Skin: No rash noted. No erythema.  Psychiatric: He has a normal mood and affect. His behavior is normal. Judgment and thought content normal.    Results for orders placed or performed in visit on 67/67/20  Basic metabolic panel  Result Value Ref Range   Glucose 202 (H) 65 - 99 mg/dL   BUN 15 8 - 27 mg/dL   Creatinine, Ser 1.38 (H) 0.76 - 1.27 mg/dL   GFR calc non Af Amer 50 (L) >59 mL/min/1.73   GFR calc Af Amer 58 (L) >59 mL/min/1.73   BUN/Creatinine Ratio 11 10 - 24   Sodium 142 134 - 144 mmol/L   Potassium 3.6 3.5 - 5.2 mmol/L   Chloride 100 96 - 106 mmol/L   CO2 24 18 - 29 mmol/L   Calcium 9.6 8.6 - 10.2 mg/dL  Bayer DCA Hb A1c Waived  Result Value Ref Range   B[facility]  Hb A1c Waived 6.5 <7.0 %  LP+ALT+AST Piccolo, Waived  Result Value Ref Range   ALT (SGPT) Piccolo, Waived 10 10 - 47 U/L   AST (SGOT) Piccolo, Waived 22 11 - 38 U/L   Cholesterol Piccolo, Waived 141 <200 mg/dL   HDL Chol Piccolo, Waived 50 (L) >59 mg/dL   Triglycerides Piccolo,Waived 198 (H) <150 mg/dL   Chol/HDL Ratio Piccolo,Waive 2.8 mg/dL   LDL Chol Calc Piccolo Waived 52 <100 mg/dL   VLDL Chol Calc Piccolo,Waive 40 (H) <30 mg/dL  Microalbumin, Urine Waived  Result Value Ref Range   Microalb, Ur Waived 10 0 - 19 mg/L   Creatinine, Urine Waived 100 10 - 300 mg/dL   Microalb/Creat Ratio <30 <30 mg/g      Assessment & Plan:   Problem List Items Addressed This Visit       Cardiovascular and Mediastinum   Essential hypertension   Relevant Medications   amLODipine (NORVASC) 10 MG tablet   benazepril-hydrochlorthiazide (LOTENSIN HCT) 20-12.5 MG tablet   carvedilol (COREG) 25 MG tablet   cloNIDine (CATAPRES) 0.3 MG tablet   doxazosin (CARDURA) 2 MG tablet   furosemide (LASIX) 40 MG tablet   pravastatin (PRAVACHOL) 40 MG tablet   Potassium Chloride ER 20 MEQ TBCR   Other Relevant Orders   Bayer DCA Hb A1c Waived   CBC with Differential/Platelet   Comprehensive metabolic panel   TSH   Urinalysis, Routine w reflex microscopic   CAD (coronary artery disease)    The current medical regimen is effective;  continue present plan and medications.       Relevant Medications   amLODipine (NORVASC) 10 MG tablet   benazepril-hydrochlorthiazide (LOTENSIN HCT) 20-12.5 MG tablet   carvedilol (COREG) 25 MG tablet   cloNIDine (CATAPRES) 0.3 MG tablet   doxazosin (CARDURA) 2 MG tablet   furosemide (LASIX) 40 MG tablet   pravastatin (PRAVACHOL) 40 MG tablet   Type 2 DM with CKD stage 3 and hypertension (HCC)    The current medical regimen is effective;  continue present plan and medications.       Relevant Medications   amLODipine (NORVASC) 10 MG tablet   benazepril-hydrochlorthiazide (LOTENSIN HCT) 20-12.5 MG tablet   carvedilol (COREG) 25 MG tablet   cloNIDine (CATAPRES) 0.3 MG tablet   doxazosin (CARDURA) 2 MG tablet   furosemide (LASIX) 40 MG tablet   glipiZIDE (GLUCOTROL) 5 MG tablet   metFORMIN (GLUCOPHAGE) 500 MG tablet   pravastatin (PRAVACHOL) 40 MG tablet     Digestive   GERD (gastroesophageal reflux disease)    The current medical regimen is effective;  continue present plan and medications.         Endocrine   Diabetes mellitus without complication ([place])    The current medical regimen is effective;  continue present plan and medications.       Relevant Medications   benazepril-hydrochlorthiazide (LOTENSIN HCT) 20-12.5 MG tablet    glipiZIDE (GLUCOTROL) 5 MG tablet   metFORMIN (GLUCOPHAGE) 500 MG tablet   pravastatin (PRAVACHOL) 40 MG tablet   Other Relevant Orders   Bayer DCA Hb A1c Waived   CBC with Differential/Platelet   Comprehensive metabolic panel   TSH   Urinalysis, Routine w reflex microscopic     Genitourinary   BPH (benign prostatic hyperplasia)    Doing well      Relevant Orders   PSA     Other   Hyperlipidemia    The current medical regimen is effective;  continue present  plan and medications.       Relevant Medications   amLODipine (NORVASC) 10 MG tablet   benazepril-hydrochlorthiazide (LOTENSIN HCT) 20-12.5 MG tablet   carvedilol (COREG) 25 MG tablet   cloNIDine (CATAPRES) 0.3 MG tablet   doxazosin (CARDURA) 2 MG tablet   furosemide (LASIX) 40 MG tablet   pravastatin (PRAVACHOL) 40 MG tablet   Other Relevant Orders   Bayer DCA Hb A1c Waived   CBC with Differential/Platelet   Comprehensive metabolic panel   Lipid panel   TSH   Urinalysis, Routine w reflex microscopic    Other Visit Diagnoses    Annual physical exam    -  Primary   Relevant Orders   Bayer DCA Hb A1c Waived   CBC with Differential/Platelet   Comprehensive metabolic panel   Lipid panel   PSA   TSH   Urinalysis, Routine w reflex microscopic   Screening cholesterol level       Relevant Orders   Lipid panel   Prostate cancer screening       Relevant Orders   PSA   Thyroid disorder screen       Relevant Orders   TSH       Follow up plan: Return in about 3 months (around 03/07/2017) for Hemoglobin A1c.

## 2016-12-06 NOTE — Assessment & Plan Note (Signed)
Doing well 

## 2016-12-07 ENCOUNTER — Encounter: Payer: Self-pay | Admitting: Family Medicine

## 2016-12-07 ENCOUNTER — Telehealth: Payer: Self-pay

## 2016-12-07 LAB — CBC WITH DIFFERENTIAL/PLATELET
Basophils Absolute: 0.1 10*3/uL (ref 0.0–0.2)
Basos: 1 %
EOS (ABSOLUTE): 0.3 10*3/uL (ref 0.0–0.4)
Eos: 5 %
HEMATOCRIT: 39.9 % (ref 37.5–51.0)
Hemoglobin: 14.1 g/dL (ref 13.0–17.7)
IMMATURE GRANS (ABS): 0 10*3/uL (ref 0.0–0.1)
IMMATURE GRANULOCYTES: 0 %
LYMPHS ABS: 1.1 10*3/uL (ref 0.7–3.1)
Lymphs: 18 %
MCH: 31.7 pg (ref 26.6–33.0)
MCHC: 35.3 g/dL (ref 31.5–35.7)
MCV: 90 fL (ref 79–97)
MONOS ABS: 0.3 10*3/uL (ref 0.1–0.9)
Monocytes: 5 %
NEUTROS PCT: 71 %
Neutrophils Absolute: 4.3 10*3/uL (ref 1.4–7.0)
Platelets: 206 10*3/uL (ref 150–379)
RBC: 4.45 x10E6/uL (ref 4.14–5.80)
RDW: 13.1 % (ref 12.3–15.4)
WBC: 6.1 10*3/uL (ref 3.4–10.8)

## 2016-12-07 LAB — COMPREHENSIVE METABOLIC PANEL
A/G RATIO: 1.7 (ref 1.2–2.2)
ALT: 17 IU/L (ref 0–44)
AST: 24 IU/L (ref 0–40)
Albumin: 4.4 g/dL (ref 3.5–4.8)
Alkaline Phosphatase: 91 IU/L (ref 39–117)
BILIRUBIN TOTAL: 0.4 mg/dL (ref 0.0–1.2)
BUN/Creatinine Ratio: 12 (ref 10–24)
BUN: 15 mg/dL (ref 8–27)
CALCIUM: 10.1 mg/dL (ref 8.6–10.2)
CHLORIDE: 96 mmol/L (ref 96–106)
CO2: 24 mmol/L (ref 18–29)
Creatinine, Ser: 1.3 mg/dL — ABNORMAL HIGH (ref 0.76–1.27)
GFR, EST AFRICAN AMERICAN: 62 mL/min/{1.73_m2} (ref >59)
GFR, EST NON AFRICAN AMERICAN: 53 mL/min/{1.73_m2} — AB (ref >59)
GLOBULIN, TOTAL: 2.6 g/dL (ref 1.5–4.5)
Glucose: 262 mg/dL — ABNORMAL HIGH (ref 65–99)
POTASSIUM: 4.1 mmol/L (ref 3.5–5.2)
SODIUM: 137 mmol/L (ref 134–144)
TOTAL PROTEIN: 7 g/dL (ref 6.0–8.5)

## 2016-12-07 LAB — LIPID PANEL
CHOL/HDL RATIO: 4.1 ratio (ref 0.0–5.0)
Cholesterol, Total: 185 mg/dL (ref 100–199)
HDL: 45 mg/dL (ref >39)
LDL Calculated: 88 mg/dL (ref 0–99)
Triglycerides: 260 mg/dL — ABNORMAL HIGH (ref 0–149)
VLDL Cholesterol Cal: 52 mg/dL — ABNORMAL HIGH (ref 5–40)

## 2016-12-07 LAB — PSA: PROSTATE SPECIFIC AG, SERUM: 1 ng/mL (ref 0.0–4.0)

## 2016-12-07 LAB — TSH: TSH: 2.88 u[IU]/mL (ref 0.450–4.500)

## 2016-12-07 NOTE — Telephone Encounter (Signed)
Patient returned Jada's call. I explained she had left for today and I would have her call him back on Monday.  Santiago Glad

## 2016-12-07 NOTE — Telephone Encounter (Signed)
-----   Message from Guadalupe Maple, MD sent at 12/07/2016 10:36 AM EDT ----- Call tell normal labs Or send a letter

## 2016-12-07 NOTE — Telephone Encounter (Signed)
Left message on machine for pt to return call to the office.  

## 2016-12-10 NOTE — Telephone Encounter (Signed)
Message relayed to patient. Verbalized understanding and denied questions.   

## 2017-01-22 DIAGNOSIS — E119 Type 2 diabetes mellitus without complications: Secondary | ICD-10-CM | POA: Diagnosis not present

## 2017-01-22 DIAGNOSIS — H524 Presbyopia: Secondary | ICD-10-CM | POA: Diagnosis not present

## 2017-01-31 DIAGNOSIS — I10 Essential (primary) hypertension: Secondary | ICD-10-CM | POA: Diagnosis not present

## 2017-01-31 DIAGNOSIS — N183 Chronic kidney disease, stage 3 (moderate): Secondary | ICD-10-CM | POA: Diagnosis not present

## 2017-01-31 DIAGNOSIS — E876 Hypokalemia: Secondary | ICD-10-CM | POA: Diagnosis not present

## 2017-01-31 DIAGNOSIS — N2581 Secondary hyperparathyroidism of renal origin: Secondary | ICD-10-CM | POA: Diagnosis not present

## 2017-03-13 ENCOUNTER — Encounter: Payer: Self-pay | Admitting: Family Medicine

## 2017-03-13 ENCOUNTER — Ambulatory Visit (INDEPENDENT_AMBULATORY_CARE_PROVIDER_SITE_OTHER): Payer: Medicare HMO | Admitting: Family Medicine

## 2017-03-13 VITALS — BP 137/69 | HR 76 | Wt 199.0 lb

## 2017-03-13 DIAGNOSIS — N183 Chronic kidney disease, stage 3 unspecified: Secondary | ICD-10-CM

## 2017-03-13 DIAGNOSIS — E782 Mixed hyperlipidemia: Secondary | ICD-10-CM

## 2017-03-13 DIAGNOSIS — E119 Type 2 diabetes mellitus without complications: Secondary | ICD-10-CM

## 2017-03-13 DIAGNOSIS — E1122 Type 2 diabetes mellitus with diabetic chronic kidney disease: Secondary | ICD-10-CM

## 2017-03-13 DIAGNOSIS — I10 Essential (primary) hypertension: Secondary | ICD-10-CM | POA: Diagnosis not present

## 2017-03-13 DIAGNOSIS — I129 Hypertensive chronic kidney disease with stage 1 through stage 4 chronic kidney disease, or unspecified chronic kidney disease: Secondary | ICD-10-CM

## 2017-03-13 LAB — BAYER DCA HB A1C WAIVED: HB A1C: 6.8 % (ref <7.0)

## 2017-03-13 NOTE — Assessment & Plan Note (Signed)
Discuss stopping and starting medicine and observing arthralgias.

## 2017-03-13 NOTE — Progress Notes (Signed)
BP 137/69   Pulse 76   Wt 199 lb (90.3 kg)   SpO2 96%   BMI 28.17 kg/m    Subjective:    Patient ID: Ryan Kirby, male    DOB: 10/29/1940, 76 y.o.   MRN: [id number]  HPI: Ryan Kirby is a 76 y.o. male  Chief Complaint  Patient presents with  . Follow-up  . Diabetes  . Fall  . Joint Pain   Patients with several concerns #1 having some joint aches again concerning the pravastatin may be contributing hasn't really tried a trial of stopping and restarting. Discussed most likely arthritis complaints but will do a trial of stopping the statin for 2 weeks observing symptoms then restarting pravastatin and observing symptoms. If pain goes away and restarts patient will contact me otherwise will need to deal with arthritis.  Patient also got tangled up and some honeysuckle and fell across a spent has a big abrasion on his right shin with multiple scabs which are healing without secondary infection.  No issues with diabetes noted low blood sugar spells and seemingly doing well with good sugars.  Relevant past medical, surgical, family and social history reviewed and updated as indicated. Interim medical history since our last visit reviewed. Allergies and medications reviewed and updated.  Review of Systems  Constitutional: Negative.   Respiratory: Negative.   Cardiovascular: Negative.     Per HPI unless specifically indicated above     Objective:    BP 137/69   Pulse 76   Wt 199 lb (90.3 kg)   SpO2 96%   BMI 28.17 kg/m   Wt Readings from Last 3 Encounters:  03/13/17 199 lb (90.3 kg)  12/06/16 202 lb (91.6 kg)  07/17/16 200 lb (90.7 kg)    Physical Exam  Constitutional: He is oriented to person, place, and time. He appears well-developed and well-nourished.  HENT:  Head: Normocephalic and atraumatic.  Eyes: Conjunctivae and EOM are normal.  Neck: Normal range of motion.  Cardiovascular: Normal rate, regular rhythm and normal heart sounds.   Pulmonary/Chest:  Effort normal and breath sounds normal.  Musculoskeletal: Normal range of motion.  Neurological: He is alert and oriented to person, place, and time.  Skin: No erythema.  Psychiatric: He has a normal mood and affect. His behavior is normal. Judgment and thought content normal.    Results for orders placed or performed in visit on 12/06/16  Microscopic Examination  Result Value Ref Range   WBC, UA None seen 0 - 5 /hpf   RBC, UA None seen 0 - 2 /hpf   Epithelial Cells (non renal) CANCELED    Bacteria, UA None seen None seen/Few  Bayer DCA Hb A1c Waived  Result Value Ref Range   Bayer DCA Hb A1c Waived 6.9 <7.0 %  CBC with Differential/Platelet  Result Value Ref Range   WBC 6.1 3.4 - 10.8 x10E3/uL   RBC 4.45 4.14 - 5.80 x10E6/uL   Hemoglobin 14.1 13.0 - 17.7 g/dL   Hematocrit 39.9 37.5 - 51.0 %   MCV 90 79 - 97 fL   MCH 31.7 26.6 - 33.0 pg   MCHC 35.3 31.5 - 35.7 g/dL   RDW 13.1 12.3 - 15.4 %   Platelets 206 150 - 379 x10E3/uL   Neutrophils 71 Not Estab. %   Lymphs 18 Not Estab. %   Monocytes 5 Not Estab. %   Eos 5 Not Estab. %   Basos 1 Not Estab. %   Neutrophils  Absolute 4.3 1.4 - 7.0 x10E3/uL   Lymphocytes Absolute 1.1 0.7 - 3.1 x10E3/uL   Monocytes Absolute 0.3 0.1 - 0.9 x10E3/uL   EOS (ABSOLUTE) 0.3 0.0 - 0.4 x10E3/uL   Basophils Absolute 0.1 0.0 - 0.2 x10E3/uL   Immature Granulocytes 0 Not Estab. %   Immature Grans (Abs) 0.0 0.0 - 0.1 x10E3/uL  Comprehensive metabolic panel  Result Value Ref Range   Glucose 262 (H) 65 - 99 mg/dL   BUN 15 8 - 27 mg/dL   Creatinine, Ser 1.30 (H) 0.76 - 1.27 mg/dL   GFR calc non Af Amer 53 (L) >59 mL/min/1.73   GFR calc Af Amer 62 >59 mL/min/1.73   BUN/Creatinine Ratio 12 10 - 24   Sodium 137 134 - 144 mmol/L   Potassium 4.1 3.5 - 5.2 mmol/L   Chloride 96 96 - 106 mmol/L   CO2 24 18 - 29 mmol/L   Calcium 10.1 8.6 - 10.2 mg/dL   Total Protein 7.0 6.0 - 8.5 g/dL   Albumin 4.4 3.5 - 4.8 g/dL   Globulin, Total 2.6 1.5 - 4.5 g/dL    Albumin/Globulin Ratio 1.7 1.2 - 2.2   Bilirubin Total 0.4 0.0 - 1.2 mg/dL   Alkaline Phosphatase 91 39 - 117 IU/L   AST 24 0 - 40 IU/L   ALT 17 0 - 44 IU/L  Lipid panel  Result Value Ref Range   Cholesterol, Total 185 100 - 199 mg/dL   Triglycerides 260 (H) 0 - 149 mg/dL   HDL 45 >39 mg/dL   VLDL Cholesterol Cal 52 (H) 5 - 40 mg/dL   LDL Calculated 88 0 - 99 mg/dL   Chol/HDL Ratio 4.1 0.0 - 5.0 ratio  PSA  Result Value Ref Range   Prostate Specific Ag, Serum 1.0 0.0 - 4.0 ng/mL  TSH  Result Value Ref Range   TSH 2.880 0.450 - 4.500 uIU/mL  Urinalysis, Routine w reflex microscopic  Result Value Ref Range   Specific Gravity, UA <1.005 (L) 1.005 - 1.030   pH, UA 5.0 5.0 - 7.5   Color, UA Yellow Yellow   Appearance Ur Clear Clear   Leukocytes, UA Negative Negative   Protein, UA Negative Negative/Trace   Glucose, UA 2+ (A) Negative   Ketones, UA Negative Negative   RBC, UA Negative Negative   Bilirubin, UA Negative Negative   Urobilinogen, Ur 0.2 0.2 - 1.0 mg/dL   Nitrite, UA Negative Negative   Microscopic Examination See below:       Assessment & Plan:   Problem List Items Addressed This Visit      Cardiovascular and Mediastinum   Essential hypertension - Primary    The current medical regimen is effective;  continue present plan and medications.       Relevant Orders   Bayer DCA Hb A1c Waived   Type 2 DM with CKD stage 3 and hypertension ([place])    The current medical regimen is effective;  continue present plan and medications.         Endocrine   Diabetes mellitus without complication ([place])   Relevant Orders   Bayer DCA Hb A1c Waived     Other   Hyperlipidemia    Discuss stopping and starting medicine and observing arthralgias.      Relevant Orders   Bayer DCA Hb A1c Waived       Follow up plan: Return in about 3 months (around 06/13/2017) for Hemoglobin A1c, BMP,  Lipids, ALT, AST.

## 2017-03-13 NOTE — Assessment & Plan Note (Signed)
The current medical regimen is effective;  continue present plan and medications.  

## 2017-05-15 DIAGNOSIS — R600 Localized edema: Secondary | ICD-10-CM | POA: Diagnosis not present

## 2017-05-15 DIAGNOSIS — N183 Chronic kidney disease, stage 3 (moderate): Secondary | ICD-10-CM | POA: Diagnosis not present

## 2017-05-15 DIAGNOSIS — I10 Essential (primary) hypertension: Secondary | ICD-10-CM | POA: Diagnosis not present

## 2017-05-15 DIAGNOSIS — N2581 Secondary hyperparathyroidism of renal origin: Secondary | ICD-10-CM | POA: Diagnosis not present

## 2017-06-24 ENCOUNTER — Encounter: Payer: Self-pay | Admitting: Family Medicine

## 2017-06-24 ENCOUNTER — Ambulatory Visit (INDEPENDENT_AMBULATORY_CARE_PROVIDER_SITE_OTHER): Payer: Medicare HMO | Admitting: Family Medicine

## 2017-06-24 VITALS — BP 139/66 | HR 71 | Wt 200.0 lb

## 2017-06-24 DIAGNOSIS — N183 Type 2 diabetes mellitus with diabetic chronic kidney disease: Secondary | ICD-10-CM

## 2017-06-24 DIAGNOSIS — I2583 Coronary atherosclerosis due to lipid rich plaque: Secondary | ICD-10-CM

## 2017-06-24 DIAGNOSIS — Z23 Encounter for immunization: Secondary | ICD-10-CM | POA: Diagnosis not present

## 2017-06-24 DIAGNOSIS — E1122 Type 2 diabetes mellitus with diabetic chronic kidney disease: Secondary | ICD-10-CM | POA: Diagnosis not present

## 2017-06-24 DIAGNOSIS — E782 Mixed hyperlipidemia: Secondary | ICD-10-CM

## 2017-06-24 DIAGNOSIS — E119 Type 2 diabetes mellitus without complications: Secondary | ICD-10-CM | POA: Diagnosis not present

## 2017-06-24 DIAGNOSIS — I129 Hypertensive chronic kidney disease with stage 1 through stage 4 chronic kidney disease, or unspecified chronic kidney disease: Secondary | ICD-10-CM | POA: Diagnosis not present

## 2017-06-24 DIAGNOSIS — I2510 Atherosclerotic heart disease of native coronary artery without angina pectoris: Secondary | ICD-10-CM

## 2017-06-24 DIAGNOSIS — I10 Essential (primary) hypertension: Secondary | ICD-10-CM

## 2017-06-24 LAB — BAYER DCA HB A1C WAIVED: HB A1C: 6.5 % (ref <7.0)

## 2017-06-24 NOTE — Assessment & Plan Note (Signed)
The current medical regimen is effective;  continue present plan and medications.  

## 2017-06-24 NOTE — Progress Notes (Signed)
BP 139/66 (BP Location: Left Arm)   Pulse 71   Wt 200 lb (90.7 kg)   SpO2 98%   BMI 28.31 kg/m    Subjective:    Patient ID: Ryan Kirby, male    DOB: [birth year], 76 y.o.   MRN: [id number]  HPI: Ryan Kirby is a 76 y.o. male  Chief Complaint  Patient presents with  . Follow-up  . Hypertension  . Generalized Body Aches  Generalized stiffness from arthritis. Can only take Tylenol due to chronic kidney disease. Patient stating that doesn't get it sometimes. Discussed heat exercise movement and/or ice. Takes medications without problems or side effects taken faithfully blood pressure checking at home indicating good control also  Relevant past medical, surgical, family and social history reviewed and updated as indicated. Interim medical history since our last visit reviewed. Allergies and medications reviewed and updated.  Review of Systems  Constitutional: Negative.   Respiratory: Negative.   Cardiovascular: Negative.     Per HPI unless specifically indicated above     Objective:    BP 139/66 (BP Location: Left Arm)   Pulse 71   Wt 200 lb (90.7 kg)   SpO2 98%   BMI 28.31 kg/m   Wt Readings from Last 3 Encounters:  06/24/17 200 lb (90.7 kg)  03/13/17 199 lb (90.3 kg)  12/06/16 202 lb (91.6 kg)    Physical Exam  Constitutional: He is oriented to person, place, and time. He appears well-developed and well-nourished.  HENT:  Head: Normocephalic and atraumatic.  Eyes: Conjunctivae and EOM are normal.  Neck: Normal range of motion.  Cardiovascular: Normal rate, regular rhythm and normal heart sounds.   Pulmonary/Chest: Effort normal and breath sounds normal.  Musculoskeletal: Normal range of motion.  Neurological: He is alert and oriented to person, place, and time.  Skin: No erythema.  Psychiatric: He has a normal mood and affect. His behavior is normal. Judgment and thought content normal.    Results for orders placed or performed in visit on 03/13/17    Bayer DCA Hb A1c Waived  Result Value Ref Range   Bayer DCA Hb A1c Waived 6.8 <7.0 %      Assessment & Plan:   Problem List Items Addressed This Visit      Cardiovascular and Mediastinum   Essential hypertension    The current medical regimen is effective;  continue present plan and medications.       Relevant Orders   Basic metabolic panel   Bayer [place] Hb A1c Waived   LP+ALT+AST Piccolo, Waived   CAD (coronary artery disease)    The current medical regimen is effective;  continue present plan and medications.       Type 2 DM with CKD stage 3 and hypertension ([place]) - Primary   Relevant Orders   Basic metabolic panel   Bayer DCA Hb A1c Waived   LP+ALT+AST Piccolo, Waived     Endocrine   Diabetes mellitus without complication ([place])   Relevant Orders   Basic metabolic panel   Bayer DCA Hb A1c Waived   LP+ALT+AST Piccolo, Waived     Other   Hyperlipidemia    The current medical regimen is effective;  continue present plan and medications.       Relevant Orders   Basic metabolic panel   Bayer DCA Hb A1c Waived   LP+ALT+AST Piccolo, [place]    Other Visit Diagnoses    Needs flu shot       Relevant  Orders   Flu vaccine HIGH DOSE PF (Fluzone High dose)       Follow up plan: Return in about 3 months (around 09/24/2017) for Hemoglobin A1c.

## 2017-06-25 LAB — BASIC METABOLIC PANEL
BUN / CREAT RATIO: 11 (ref 10–24)
BUN: 16 mg/dL (ref 8–27)
CO2: 26 mmol/L (ref 20–29)
CREATININE: 1.51 mg/dL — AB (ref 0.76–1.27)
Calcium: 10 mg/dL (ref 8.6–10.2)
Chloride: 99 mmol/L (ref 96–106)
GFR calc Af Amer: 51 mL/min/{1.73_m2} — ABNORMAL LOW (ref >59)
GFR, EST NON AFRICAN AMERICAN: 45 mL/min/{1.73_m2} — AB (ref >59)
Glucose: 257 mg/dL — ABNORMAL HIGH (ref 65–99)
Potassium: 3.7 mmol/L (ref 3.5–5.2)
SODIUM: 139 mmol/L (ref 134–144)

## 2017-06-25 LAB — LIPID PANEL W/O CHOL/HDL RATIO
Cholesterol, Total: 142 mg/dL (ref 100–199)
HDL: 45 mg/dL (ref >39)
LDL CALC: 53 mg/dL (ref 0–99)
Triglycerides: 221 mg/dL — ABNORMAL HIGH (ref 0–149)
VLDL Cholesterol Cal: 44 mg/dL — ABNORMAL HIGH (ref 5–40)

## 2017-06-26 ENCOUNTER — Encounter: Payer: Self-pay | Admitting: Family Medicine

## 2017-08-23 ENCOUNTER — Encounter: Payer: Self-pay | Admitting: Family Medicine

## 2017-08-23 ENCOUNTER — Ambulatory Visit: Payer: Medicare HMO | Admitting: Family Medicine

## 2017-08-23 VITALS — BP 143/76 | HR 75 | Temp 98.4°F | Wt 195.0 lb

## 2017-08-23 DIAGNOSIS — J069 Acute upper respiratory infection, unspecified: Secondary | ICD-10-CM

## 2017-08-23 DIAGNOSIS — B9789 Other viral agents as the cause of diseases classified elsewhere: Secondary | ICD-10-CM

## 2017-08-23 MED ORDER — AZITHROMYCIN 250 MG PO TABS: ORAL_TABLET | ORAL | 0 refills | Status: DC

## 2017-08-23 MED ORDER — HYDROCOD POLST-CPM POLST ER 10-8 MG/5ML PO SUER: 5.0000 mL | Freq: Two times a day (BID) | ORAL | 0 refills | Status: DC | PRN

## 2017-08-23 NOTE — Progress Notes (Signed)
   BP (!) 143/76   Pulse 75   Temp 98.4 F (36.9 C) (Oral)   Wt 195 lb (88.5 kg)   SpO2 96%   BMI 27.61 kg/m    Subjective:    Patient ID: Ryan Kirby, male    DOB: 1941-04-05, 76 y.o.   MRN: [id number]  HPI: Ryan Kirby is a 76 y.o. male  Chief Complaint  Patient presents with  . URI    pt states he has had a cough, scratchy throat, watery eyes, congestion, and pressure since Monday   Congestion, watering eyes, dry hacking cough, scratchy throat x 5 days. Denies wheezing, SOB, fevers, chills. Trying coricidin and OTC allergy tabs, lozenges with some relief. No sick contacts he's aware of.   Relevant past medical, surgical, family and social history reviewed and updated as indicated. Interim medical history since our last visit reviewed. Allergies and medications reviewed and updated.  Review of Systems  Constitutional: Negative.   HENT: Positive for congestion, rhinorrhea and sore throat.   Eyes: Positive for discharge.  Respiratory: Positive for cough.   Cardiovascular: Negative.   Gastrointestinal: Negative.   Genitourinary: Negative.   Musculoskeletal: Negative.   Neurological: Negative.   Psychiatric/Behavioral: Negative.    Per HPI unless specifically indicated above     Objective:    BP (!) 143/76   Pulse 75   Temp 98.4 F (36.9 C) (Oral)   Wt 195 lb (88.5 kg)   SpO2 96%   BMI 27.61 kg/m   Wt Readings from Last 3 Encounters:  08/23/17 195 lb (88.5 kg)  06/24/17 200 lb (90.7 kg)  03/13/17 199 lb (90.3 kg)    Physical Exam  Constitutional: He is oriented to person, place, and time. He appears well-developed and well-nourished. No distress.  HENT:  Head: Atraumatic.  Nasal mucosa boggy and injected, rhinorrhea present Oropharynx injected  Eyes: Conjunctivae are normal. Pupils are equal, round, and reactive to light. No scleral icterus.  Neck: Normal range of motion. Neck supple.  Cardiovascular: Normal rate and normal heart sounds.    Pulmonary/Chest: Effort normal and breath sounds normal. No respiratory distress. He has no wheezes.  Abdominal: Soft. Bowel sounds are normal.  Musculoskeletal: Normal range of motion.  Lymphadenopathy:    He has no cervical adenopathy.  Neurological: He is alert and oriented to person, place, and time.  Skin: Skin is warm and dry.  Psychiatric: He has a normal mood and affect. His behavior is normal.  Nursing note and vitals reviewed.      Assessment & Plan:   Problem List Items Addressed This Visit    None    Visit Diagnoses    Viral URI with cough    -  Primary   No evidence currently of bacterial infection, will treat symptomatically with mucinex, flonase, and zyrtec. Zpack sent in case worsening over weekend   Relevant Medications   azithromycin (ZITHROMAX) 250 MG tablet       Follow up plan: Return if symptoms worsen or fail to improve.

## 2017-08-26 NOTE — Patient Instructions (Signed)
Follow up as needed

## 2017-09-23 ENCOUNTER — Ambulatory Visit (INDEPENDENT_AMBULATORY_CARE_PROVIDER_SITE_OTHER): Payer: Self-pay | Admitting: Podiatry

## 2017-09-23 ENCOUNTER — Ambulatory Visit (INDEPENDENT_AMBULATORY_CARE_PROVIDER_SITE_OTHER): Payer: Medicare HMO | Admitting: Family Medicine

## 2017-09-23 ENCOUNTER — Encounter: Payer: Self-pay | Admitting: Podiatry

## 2017-09-23 ENCOUNTER — Ambulatory Visit (INDEPENDENT_AMBULATORY_CARE_PROVIDER_SITE_OTHER): Payer: Self-pay

## 2017-09-23 ENCOUNTER — Encounter: Payer: Self-pay | Admitting: Family Medicine

## 2017-09-23 VITALS — BP 149/70 | HR 72 | Wt 196.0 lb

## 2017-09-23 VITALS — BP 158/77 | HR 70 | Resp 16

## 2017-09-23 DIAGNOSIS — E782 Mixed hyperlipidemia: Secondary | ICD-10-CM

## 2017-09-23 DIAGNOSIS — I129 Hypertensive chronic kidney disease with stage 1 through stage 4 chronic kidney disease, or unspecified chronic kidney disease: Secondary | ICD-10-CM | POA: Diagnosis not present

## 2017-09-23 DIAGNOSIS — N183 Chronic kidney disease, stage 3 unspecified: Secondary | ICD-10-CM

## 2017-09-23 DIAGNOSIS — I10 Essential (primary) hypertension: Secondary | ICD-10-CM

## 2017-09-23 DIAGNOSIS — E1122 Type 2 diabetes mellitus with diabetic chronic kidney disease: Secondary | ICD-10-CM

## 2017-09-23 DIAGNOSIS — M722 Plantar fascial fibromatosis: Secondary | ICD-10-CM

## 2017-09-23 LAB — BAYER DCA HB A1C WAIVED: HB A1C (BAYER DCA - WAIVED): 6.8 % (ref <7.0)

## 2017-09-23 MED ORDER — MELOXICAM 15 MG PO TABS: 15.0000 mg | ORAL_TABLET | Freq: Every day | ORAL | 3 refills | Status: DC

## 2017-09-23 NOTE — Patient Instructions (Signed)

## 2017-09-23 NOTE — Progress Notes (Signed)
BP (!) 149/70   Pulse 72   Wt 196 lb (88.9 kg)   SpO2 99%   BMI 27.75 kg/m    Subjective:    Patient ID: Ryan Kirby, male    DOB: 08/16/1941, 77 y.o.   MRN: [id number]  HPI: Ryan Kirby is a 77 y.o. male  Chief Complaint  Patient presents with  . Follow-up  . Diabetes  . Dizziness    Balance  Patient follow-up has some lightheaded dizziness kind of spells when bending over to standing up.  Lasts just for a few seconds maybe 2-3 times a month.  Otherwise doing well no low blood sugar spells or other issues taking medications without problems.  Also taking cholesterol without problems.  Relevant past medical, surgical, family and social history reviewed and updated as indicated. Interim medical history since our last visit reviewed. Allergies and medications reviewed and updated.  Review of Systems  Constitutional: Negative.   Respiratory: Negative.   Cardiovascular: Negative.     Per HPI unless specifically indicated above     Objective:    BP (!) 149/70   Pulse 72   Wt 196 lb (88.9 kg)   SpO2 99%   BMI 27.75 kg/m   Wt Readings from Last 3 Encounters:  09/23/17 196 lb (88.9 kg)  08/23/17 195 lb (88.5 kg)  06/24/17 200 lb (90.7 kg)    Physical Exam  Constitutional: He is oriented to person, place, and time. He appears well-developed and well-nourished.  HENT:  Head: Normocephalic and atraumatic.  Eyes: Conjunctivae and EOM are normal.  Neck: Normal range of motion.  Cardiovascular: Normal rate, regular rhythm and normal heart sounds.  Pulmonary/Chest: Effort normal and breath sounds normal.  Musculoskeletal: Normal range of motion.  Neurological: He is alert and oriented to person, place, and time.  Skin: No erythema.  Psychiatric: He has a normal mood and affect. His behavior is normal. Judgment and thought content normal.    Results for orders placed or performed in visit on 69/48/54  Basic metabolic panel  Result Value Ref Range   Glucose 257  (H) 65 - 99 mg/dL   BUN 16 8 - 27 mg/dL   Creatinine, Ser 1.51 (H) 0.76 - 1.27 mg/dL   GFR calc non Af Amer 45 (L) >59 mL/min/1.73   GFR calc Af Amer 51 (L) >59 mL/min/1.73   BUN/Creatinine Ratio 11 10 - 24   Sodium 139 134 - 144 mmol/L   Potassium 3.7 3.5 - 5.2 mmol/L   Chloride 99 96 - 106 mmol/L   CO2 26 20 - 29 mmol/L   Calcium 10.0 8.6 - 10.2 mg/dL  Bayer DCA Hb A1c Waived  Result Value Ref Range   Bayer DCA Hb A1c Waived 6.5 <7.0 %  Lipid Panel w/o Chol/HDL Ratio  Result Value Ref Range   Cholesterol, Total 142 100 - 199 mg/dL   Triglycerides 221 (H) 0 - 149 mg/dL   HDL 45 >39 mg/dL   VLDL Cholesterol Cal 44 (H) 5 - 40 mg/dL   LDL Calculated 53 0 - 99 mg/dL      Assessment & Plan:   Problem List Items Addressed This Visit      Cardiovascular and Mediastinum   Essential hypertension    Discussed lightheaded spells and [place] maneuvers      Type 2 DM with CKD stage 3 and hypertension ([place]) - Primary    The current medical regimen is effective;  continue present plan and  medications.       Relevant Orders   Bayer DCA Hb A1c Waived     Other   Hyperlipidemia    The current medical regimen is effective;  continue present plan and medications.           Follow up plan: Return in about 3 months (around 12/22/2017) for Physical Exam, Hemoglobin A1c.

## 2017-09-23 NOTE — Assessment & Plan Note (Signed)
The current medical regimen is effective;  continue present plan and medications.  

## 2017-09-23 NOTE — Progress Notes (Signed)
Subjective:  Patient ID: Ryan Kirby, male    DOB: 1941-05-19,  MRN: [id number] HPI Chief Complaint  Patient presents with  . Foot Pain    Plantar heel right - aching x 3-4 months, AM pain, soaking in epsom salt water and using muscle rub-no help    77 y.o. male presents with the above complaint.     Past Medical History:  Diagnosis Date  . Allergy   . CAD (coronary artery disease)   . Chronic kidney disease    pt is not on dialysis  . Diabetes mellitus without complication ([place])   . GERD (gastroesophageal reflux disease)   . Hyperlipidemia    Past Surgical History:  Procedure Laterality Date  . BOWEL RESECTION  2004  . CORONARY ARTERY BYPASS GRAFT    . FINGER AMPUTATION Left 2011   5th  . KNEE ARTHROSCOPY Right 09/12/2015   Procedure: ARTHROSCOPY KNEE, LATERAL MENISECTOMY, CHONDROPLASTY;  Surgeon: Dereck Leep, MD;  Location: [facility] ORS;  Service: Orthopedics;  Laterality: Right;    Current Outpatient Medications:  .  amLODipine (NORVASC) 10 MG tablet, Take 1 tablet (10 mg total) by mouth daily., Disp: 90 tablet, Rfl: 4 .  benazepril-hydrochlorthiazide (LOTENSIN HCT) 20-12.5 MG tablet, Take 1 tablet by mouth daily., Disp: 90 tablet, Rfl: 4 .  calcitRIOL (ROCALTROL) 0.25 MCG capsule, 3 (three) times a week., Disp: , Rfl:  .  carvedilol (COREG) 25 MG tablet, Take 2 tablets (50 mg total) by mouth 2 (two) times daily., Disp: 360 tablet, Rfl: 4 .  Cetirizine HCl 10 MG CAPS, Take 1 capsule by mouth every morning., Disp: , Rfl:  .  cloNIDine (CATAPRES) 0.3 MG tablet, Take 1 tablet (0.3 mg total) by mouth 2 (two) times daily., Disp: 180 tablet, Rfl: 4 .  doxazosin (CARDURA) 2 MG tablet, Take 1 tablet (2 mg total) by mouth every morning., Disp: 90 tablet, Rfl: 4 .  esomeprazole (NEXIUM) 20 MG capsule, Take 20 mg by mouth daily. Reported on 09/12/2015, Disp: , Rfl:  .  ferrous sulfate 325 (65 FE) MG tablet, Take by mouth., Disp: , Rfl:  .  furosemide (LASIX) 40 MG tablet, Take 1  tablet (40 mg total) by mouth daily., Disp: 90 tablet, Rfl: 4 .  glipiZIDE (GLUCOTROL) 5 MG tablet, Take 1 tablet (5 mg total) by mouth daily before breakfast., Disp: 90 tablet, Rfl: 4 .  Glucosamine Sulfate 1000 MG TABS, Take 1 tablet by mouth every morning., Disp: , Rfl:  .  metFORMIN (GLUCOPHAGE) 500 MG tablet, Take 1 tablet (500 mg total) by mouth 2 (two) times daily., Disp: 180 tablet, Rfl: 4 .  OMEPRAZOLE PO, , Disp: , Rfl:  .  Potassium Chloride ER 20 MEQ TBCR, Take 20 mEq by mouth daily., Disp: 90 tablet, Rfl: 4 .  pravastatin (PRAVACHOL) 40 MG tablet, Take 1 tablet (40 mg total) by mouth at bedtime., Disp: 90 tablet, Rfl: 4  Allergies  Allergen Reactions  . Oxycontin [Oxycodone] Nausea And Vomiting   Review of Systems  All other systems reviewed and are negative.  Objective:   Vitals:   09/23/17 1436  BP: (!) 158/77  Pulse: 70  Resp: 16    General: Well developed, nourished, in no acute distress, alert and oriented x3   Dermatological: Skin is warm, dry and supple bilateral. Nails x 10 are well maintained; remaining integument appears unremarkable at this time. There are no open sores, no preulcerative lesions, no rash or signs of infection present.  Vascular: Dorsalis  Pedis artery and Posterior Tibial artery pedal pulses are 2/4 bilateral with immedate capillary fill time. Pedal hair growth present. No varicosities and no lower extremity edema present bilateral.   Neruologic: Grossly intact via light touch bilateral. Vibratory intact via tuning fork bilateral. Protective threshold with Semmes Wienstein monofilament intact to all pedal sites bilateral. Patellar and Achilles deep tendon reflexes 2+ bilateral. No Babinski or clonus noted bilateral.   Musculoskeletal: No gross boney pedal deformities bilateral. No pain, crepitus, or limitation noted with foot and ankle range of motion bilateral. Muscular strength 5/5 in all groups tested bilateral.  Gait: Unassisted,  Nonantalgic.    Radiographs:  3 views of the right foot was taken today no acute trauma.  Plantar distally oriented calcaneal heel spur with soft tissue increase in density of plantar fascial calcaneal insertion site indicative of plantar fasciitis.  Assessment & Plan:   Assessment: Plantar fasciitis right.  Plan:    Procedure: Injection Tendon/Ligament Consent: Verbal consent obtained. Location: Right plantar fascia; medial approach. Skin Prep: Alcohol. Injectate: 1 cc 0.5% marcaine plain, 0.5 cc kenalog 40. Disposition: Patient tolerated procedure well. Injection site dressed with a band-aid.  Kenedy Haisley T. [place], [place]

## 2017-09-23 NOTE — Assessment & Plan Note (Signed)
Discussed lightheaded spells and [place] maneuvers

## 2017-10-21 ENCOUNTER — Ambulatory Visit: Payer: Self-pay | Admitting: Podiatry

## 2017-12-16 ENCOUNTER — Ambulatory Visit (INDEPENDENT_AMBULATORY_CARE_PROVIDER_SITE_OTHER): Payer: Medicare HMO

## 2017-12-16 ENCOUNTER — Other Ambulatory Visit: Payer: Self-pay | Admitting: Family Medicine

## 2017-12-16 VITALS — BP 142/76 | HR 64 | Temp 97.8°F | Resp 16 | Ht 70.0 in | Wt 193.6 lb

## 2017-12-16 DIAGNOSIS — I10 Essential (primary) hypertension: Secondary | ICD-10-CM

## 2017-12-16 DIAGNOSIS — Z0000 Encounter for general adult medical examination without abnormal findings: Secondary | ICD-10-CM | POA: Diagnosis not present

## 2017-12-16 DIAGNOSIS — E119 Type 2 diabetes mellitus without complications: Secondary | ICD-10-CM

## 2017-12-16 DIAGNOSIS — E7800 Pure hypercholesterolemia, unspecified: Secondary | ICD-10-CM

## 2017-12-16 NOTE — Progress Notes (Signed)
Subjective:   Ryan Kirby is a 77 y.o. male who presents for Medicare Annual/Subsequent preventive examination.  Review of Systems:  Cardiac Risk Factors include: male gender;diabetes mellitus;advanced age (>38men, >23 women);hypertension;dyslipidemia     Objective:    Vitals: BP (!) 142/76 (BP Location: Left Arm, Patient Position: Sitting)   Pulse 64   Temp 97.8 F (36.6 C) (Temporal)   Resp 16   Ht 5\' 10"  (1.778 m)   Wt 193 lb 9.6 oz (87.8 kg)   BMI 27.78 kg/m   Body mass index is 27.78 kg/m.  Advanced Directives 12/16/2017 09/12/2015 09/02/2015  Does Patient Have a Medical Advance Directive? No Yes Yes  Type of Advance Directive - - Living will  Copy of [place] in Chart? - No - copy requested No - copy requested  Would patient like information on creating a medical advance directive? Yes (MAU/Ambulatory/Procedural Areas - Information given) - -    Tobacco Social History   Tobacco Use  Smoking Status Never Smoker  Smokeless Tobacco Never Used     Counseling given: Not Answered   Clinical Intake:  Pre-visit preparation completed: Yes  Pain : No/denies pain     Nutritional Status: BMI 25 -29 Overweight Nutritional Risks: None Diabetes: Yes CBG done?: No Did pt. bring in CBG monitor from home?: No  How often do you need to have someone help you when you read instructions, pamphlets, or other written materials from your doctor or pharmacy?: 1 - Never What is the last grade level you completed in school?: high school   Interpreter Needed?: No  Information entered by :: Camren Lipsett,LPN   Past Medical History:  Diagnosis Date  . Allergy   . CAD (coronary artery disease)   . Chronic kidney disease    pt is not on dialysis  . Diabetes mellitus without complication ([place])   . GERD (gastroesophageal reflux disease)   . Hyperlipidemia    Past Surgical History:  Procedure Laterality Date  . BOWEL RESECTION  2004  . CORONARY ARTERY  BYPASS GRAFT    . FINGER AMPUTATION Left 2011   5th  . KNEE ARTHROSCOPY Right 09/12/2015   Procedure: ARTHROSCOPY KNEE, LATERAL MENISECTOMY, CHONDROPLASTY;  Surgeon: Dereck Leep, MD;  Location: [facility] ORS;  Service: Orthopedics;  Laterality: Right;   Family History  Problem Relation Age of Onset  . Arthritis Mother   . Hypertension Mother   . Breast cancer Mother   . Arthritis Father   . Hypertension Father   . Colon cancer Father    Social History   Socioeconomic History  . Marital status: Single    Spouse name: Not on file  . Number of children: Not on file  . Years of education: Not on file  . Highest education level: Not on file  Occupational History  . Not on file  Social Needs  . Financial resource strain: Not hard at all  . Food insecurity:    Worry: Never true    Inability: Never true  . Transportation needs:    Medical: No    Non-medical: No  Tobacco Use  . Smoking status: Never Smoker  . Smokeless tobacco: Never Used  Substance and Sexual Activity  . Alcohol use: No    Alcohol/week: 0.0 oz  . Drug use: No  . Sexual activity: Not on file  Lifestyle  . Physical activity:    Days per week: 0 days    Minutes per session: 0 min  .  Stress: Not at all  Relationships  . Social connections:    Talks on phone: Once a week    Gets together: Once a week    Attends religious service: More than 4 times per year    Active member of club or organization: No    Attends meetings of clubs or organizations: Never    Relationship status: Never married  Other Topics Concern  . Not on file  Social History Narrative  . Not on file    Outpatient Encounter Medications as of 12/16/2017  Medication Sig  . amLODipine (NORVASC) 10 MG tablet Take 1 tablet (10 mg total) by mouth daily.  . benazepril-hydrochlorthiazide (LOTENSIN HCT) 20-12.5 MG tablet Take 1 tablet by mouth daily.  . calcitRIOL (ROCALTROL) 0.25 MCG capsule 3 (three) times a week.  . carvedilol (COREG) 25 MG  tablet Take 2 tablets (50 mg total) by mouth 2 (two) times daily.  . Cetirizine HCl 10 MG CAPS Take 1 capsule by mouth every morning.  . cloNIDine (CATAPRES) 0.3 MG tablet Take 1 tablet (0.3 mg total) by mouth 2 (two) times daily.  Marland Kitchen doxazosin (CARDURA) 2 MG tablet Take 1 tablet (2 mg total) by mouth every morning.  Marland Kitchen esomeprazole (NEXIUM) 20 MG capsule Take 20 mg by mouth daily. Reported on 09/12/2015  . ferrous sulfate 325 (65 FE) MG tablet Take by mouth.  . furosemide (LASIX) 40 MG tablet Take 1 tablet (40 mg total) by mouth daily.  Marland Kitchen glipiZIDE (GLUCOTROL) 5 MG tablet Take 1 tablet (5 mg total) by mouth daily before breakfast.  . metFORMIN (GLUCOPHAGE) 500 MG tablet Take 1 tablet (500 mg total) by mouth 2 (two) times daily.  Marland Kitchen OMEPRAZOLE PO   . Potassium Chloride ER 20 MEQ TBCR Take 20 mEq by mouth daily.  . pravastatin (PRAVACHOL) 40 MG tablet Take 1 tablet (40 mg total) by mouth at bedtime.  . Glucosamine Sulfate 1000 MG TABS Take 1 tablet by mouth every morning.  . meloxicam (MOBIC) 15 MG tablet Take 1 tablet (15 mg total) by mouth daily. (Patient not taking: Reported on 12/16/2017)   No facility-administered encounter medications on file as of 12/16/2017.     Activities of Daily Living In your present state of health, do you have any difficulty performing the following activities: 12/16/2017  Hearing? N  Vision? N  Difficulty concentrating or making decisions? N  Walking or climbing stairs? N  Dressing or bathing? N  Doing errands, shopping? N  Preparing Food and eating ? N  Using the Toilet? N  In the past six months, have you accidently leaked urine? N  Do you have problems with loss of bowel control? N  Managing your Medications? N  Managing your Finances? N  Housekeeping or managing your Housekeeping? N  Some recent data might be hidden    Patient Care Team: Guadalupe Maple, MD as PCP - General (Family Medicine) Guadalupe Maple, MD as PCP - Family Medicine (Family  Medicine) Garrel Ridgel, DPM as Consulting Physician (Podiatry)   Assessment:   This is a routine wellness examination for [name].  Exercise Activities and Dietary recommendations Current Exercise Habits: The patient does not participate in regular exercise at present, Exercise limited by: None identified  Goals    . DIET - INCREASE WATER INTAKE     recommend drinking at least 6-8 glasses of water a day.       Fall Risk Fall Risk  12/16/2017 09/23/2017 06/24/2017 03/13/2017 12/06/2016  Falls  in the past year? No No No Yes No  Number falls in past yr: - - - 1 -  Injury with Fall? - - - Yes -  Follow up - - - Falls evaluation completed -   Is the patient's home free of loose throw rugs in walkways, pet beds, electrical cords, etc?   no      Grab bars in the bathroom? no      Handrails on the stairs?   yes      Adequate lighting?   yes  Timed Get Up and Go Performed: Completed in 8 seconds with no use of assistive devices, steady gait. No intervention needed at this time.   Depression Screen PHQ 2/9 Scores 12/16/2017 09/23/2017 06/24/2017 03/13/2017  PHQ - 2 Score 2 0 0 0  PHQ- 9 Score 6 - - -    Cognitive Function     6CIT Screen 12/16/2017  What Year? 0 points  What month? 0 points  What time? 0 points  Count back from 20 0 points  Months in reverse 0 points  Repeat phrase 0 points  Total Score 0    Immunization History  Administered Date(s) Administered  . Influenza, High Dose Seasonal PF 07/17/2016, 06/24/2017  . Influenza,inj,Quad PF,6+ Mos 06/02/2015  . Pneumococcal Conjugate-13 12/01/2015  . Pneumococcal-Unspecified 07/01/2007  . Td 05/25/2008    Qualifies for Shingles Vaccine? Yes, discussed shingrix vaccine   Screening Tests Health Maintenance  Topic Date Due  . FOOT EXAM  03/29/2017  . HEMOGLOBIN A1C  03/23/2018  . INFLUENZA VACCINE  04/03/2018  . TETANUS/TDAP  05/25/2018  . COLONOSCOPY  08/21/2018  . OPHTHALMOLOGY EXAM  11/16/2018  . PNA vac Low Risk  Adult  Completed   Cancer Screenings: Lung: Low Dose CT Chest recommended if Age 50-80 years, 30 pack-year currently smoking OR have quit w/in 15years. Patient does not qualify. Colorectal: completed 08/21/2013  Additional Screenings:  Hepatitis C Screening: not indicated       Plan:    I have personally reviewed and addressed the Medicare Annual Wellness questionnaire and have noted the following in the patient's chart:  A. Medical and social history B. Use of alcohol, tobacco or illicit drugs  C. Current medications and supplements D. Functional ability and status E.  Nutritional status F.  Physical activity G. Advance directives H. List of other physicians I.  Hospitalizations, surgeries, and ER visits in previous 12 months J.  [place] such as hearing and vision if needed, cognitive and depression L. Referrals and appointments   In addition, I have reviewed and discussed with patient certain preventive protocols, quality metrics, and best practice recommendations. A written personalized care plan for preventive services as well as general preventive health recommendations were provided to patient.   Signed,  Tyler Aas, LPN Nurse Health Advisor   Nurse Notes: due for diabetic foot exam- CPE on 12/24/2017 with Dr.[name].

## 2017-12-16 NOTE — Patient Instructions (Signed)
Ryan Kirby , Thank you for taking time to come for your Medicare Wellness Visit. I appreciate your ongoing commitment to your health goals. Please review the following plan we discussed and let me know if I can assist you in the future.   Screening recommendations/referrals: Colonoscopy: completed 08/21/2013 Recommended yearly ophthalmology/optometry visit for glaucoma screening and checkup Recommended yearly dental visit for hygiene and checkup  Vaccinations: Influenza vaccine: up to date  Pneumococcal vaccine: up to date Tdap vaccine: up to date Shingles vaccine: eligible, check with your insurance company for coverage    Advanced directives: Advance directive discussed with you today. I have provided a copy for you to complete at home and have notarized. Once this is complete please bring a copy in to our office so we can scan it into your chart.  Conditions/risks identified: recommend drinking at least 6-8 glasses of water a day.  Next appointment: Follow up on 12/24/2017 at [place] with [facility]. Follow up in one year for your annual wellness exam.   Preventive Care 65 Years and Older, Male Preventive care refers to lifestyle choices and visits with your health care provider that can promote health and wellness. What does preventive care include?  A yearly physical exam. This is also called an annual well check.  Dental exams once or twice a year.  Routine eye exams. Ask your health care provider how often you should have your eyes checked.  Personal lifestyle choices, including:  Daily care of your teeth and gums.  Regular physical activity.  Eating a healthy diet.  Avoiding tobacco and drug use.  Limiting alcohol use.  Practicing safe sex.  Taking low doses of aspirin every day.  Taking vitamin and mineral supplements as recommended by your health care provider. What happens during an annual well check? The services and screenings done by your health care provider  during your annual well check will depend on your age, overall health, lifestyle risk factors, and family history of disease. Counseling  Your health care provider may ask you questions about your:  Alcohol use.  Tobacco use.  Drug use.  Emotional well-being.  Home and relationship well-being.  Sexual activity.  Eating habits.  History of falls.  Memory and ability to understand (cognition).  Work and work Statistician. Screening  You may have the following tests or measurements:  Height, weight, and BMI.  Blood pressure.  Lipid and cholesterol levels. These may be checked every 5 years, or more frequently if you are over 55 years old.  Skin check.  Lung cancer screening. You may have this screening every year starting at age 93 if you have a 30-pack-year history of smoking and currently smoke or have quit within the past 15 years.  Fecal occult blood test (FOBT) of the stool. You may have this test every year starting at age 48.  Flexible sigmoidoscopy or colonoscopy. You may have a sigmoidoscopy every 5 years or a colonoscopy every 10 years starting at age 72.  Prostate cancer screening. Recommendations will vary depending on your family history and other risks.  Hepatitis C blood test.  Hepatitis B blood test.  Sexually transmitted disease (STD) testing.  Diabetes screening. This is done by checking your blood sugar (glucose) after you have not eaten for a while (fasting). You may have this done every 1-3 years.  Abdominal aortic aneurysm (AAA) screening. You may need this if you are a current or former smoker.  Osteoporosis. You may be screened starting at age 69 if  you are at high risk. Talk with your health care provider about your test results, treatment options, and if necessary, the need for more tests. Vaccines  Your health care provider may recommend certain vaccines, such as:  Influenza vaccine. This is recommended every year.  Tetanus,  diphtheria, and acellular pertussis (Tdap, Td) vaccine. You may need a Td booster every 10 years.  Zoster vaccine. You may need this after age 75.  Pneumococcal 13-valent conjugate (PCV13) vaccine. One dose is recommended after age 34.  Pneumococcal polysaccharide (PPSV23) vaccine. One dose is recommended after age 32. Talk to your health care provider about which screenings and vaccines you need and how often you need them. This information is not intended to replace advice given to you by your health care provider. Make sure you discuss any questions you have with your health care provider. Document Released: 09/16/2015 Document Revised: 05/09/2016 Document Reviewed: 06/21/2015 Elsevier Interactive Patient Education  2017 [place] Prevention in the Home Falls can cause injuries. They can happen to people of all ages. There are many things you can do to make your home safe and to help prevent falls. What can I do on the outside of my home?  Regularly fix the edges of walkways and driveways and fix any cracks.  Remove anything that might make you trip as you walk through a door, such as a raised step or threshold.  Trim any bushes or trees on the path to your home.  Use bright outdoor lighting.  Clear any walking paths of anything that might make someone trip, such as rocks or tools.  Regularly check to see if handrails are loose or broken. Make sure that both sides of any steps have handrails.  Any raised decks and porches should have guardrails on the edges.  Have any leaves, snow, or ice cleared regularly.  Use sand or salt on walking paths during winter.  Clean up any spills in your garage right away. This includes oil or grease spills. What can I do in the bathroom?  Use night lights.  Install grab bars by the toilet and in the tub and shower. Do not use towel bars as grab bars.  Use non-skid mats or decals in the tub or shower.  If you need to sit down in  the shower, use a plastic, non-slip stool.  Keep the floor dry. Clean up any water that spills on the floor as soon as it happens.  Remove soap buildup in the tub or shower regularly.  Attach bath mats securely with double-sided non-slip rug tape.  Do not have throw rugs and other things on the floor that can make you trip. What can I do in the bedroom?  Use night lights.  Make sure that you have a light by your bed that is easy to reach.  Do not use any sheets or blankets that are too big for your bed. They should not hang down onto the floor.  Have a firm chair that has side arms. You can use this for support while you get dressed.  Do not have throw rugs and other things on the floor that can make you trip. What can I do in the kitchen?  Clean up any spills right away.  Avoid walking on wet floors.  Keep items that you use a lot in easy-to-reach places.  If you need to reach something above you, use a strong step stool that has a grab bar.  Keep electrical cords  out of the way.  Do not use floor polish or wax that makes floors slippery. If you must use wax, use non-skid floor wax.  Do not have throw rugs and other things on the floor that can make you trip. What can I do with my stairs?  Do not leave any items on the stairs.  Make sure that there are handrails on both sides of the stairs and use them. Fix handrails that are broken or loose. Make sure that handrails are as long as the stairways.  Check any carpeting to make sure that it is firmly attached to the stairs. Fix any carpet that is loose or worn.  Avoid having throw rugs at the top or bottom of the stairs. If you do have throw rugs, attach them to the floor with carpet tape.  Make sure that you have a light switch at the top of the stairs and the bottom of the stairs. If you do not have them, ask someone to add them for you. What else can I do to help prevent falls?  Wear shoes that:  Do not have high  heels.  Have rubber bottoms.  Are comfortable and fit you well.  Are closed at the toe. Do not wear sandals.  If you use a stepladder:  Make sure that it is fully opened. Do not climb a closed stepladder.  Make sure that both sides of the stepladder are locked into place.  Ask someone to hold it for you, if possible.  Clearly mark and make sure that you can see:  Any grab bars or handrails.  First and last steps.  Where the edge of each step is.  Use tools that help you move around (mobility aids) if they are needed. These include:  Canes.  Walkers.  Scooters.  Crutches.  Turn on the lights when you go into a dark area. Replace any light bulbs as soon as they burn out.  Set up your furniture so you have a clear path. Avoid moving your furniture around.  If any of your floors are uneven, fix them.  If there are any pets around you, be aware of where they are.  Review your medicines with your doctor. Some medicines can make you feel dizzy. This can increase your chance of falling. Ask your doctor what other things that you can do to help prevent falls. This information is not intended to replace advice given to you by your health care provider. Make sure you discuss any questions you have with your health care provider. Document Released: 06/16/2009 Document Revised: 01/26/2016 Document Reviewed: 09/24/2014 Elsevier Interactive Patient Education  2017 [employer].

## 2017-12-24 ENCOUNTER — Encounter: Payer: Self-pay | Admitting: Family Medicine

## 2017-12-24 ENCOUNTER — Ambulatory Visit (INDEPENDENT_AMBULATORY_CARE_PROVIDER_SITE_OTHER): Payer: Medicare HMO | Admitting: Family Medicine

## 2017-12-24 VITALS — BP 144/75 | HR 71 | Ht 70.0 in | Wt 193.0 lb

## 2017-12-24 DIAGNOSIS — E1122 Type 2 diabetes mellitus with diabetic chronic kidney disease: Secondary | ICD-10-CM

## 2017-12-24 DIAGNOSIS — I2583 Coronary atherosclerosis due to lipid rich plaque: Secondary | ICD-10-CM | POA: Diagnosis not present

## 2017-12-24 DIAGNOSIS — E782 Mixed hyperlipidemia: Secondary | ICD-10-CM | POA: Diagnosis not present

## 2017-12-24 DIAGNOSIS — Z125 Encounter for screening for malignant neoplasm of prostate: Secondary | ICD-10-CM | POA: Diagnosis not present

## 2017-12-24 DIAGNOSIS — I10 Essential (primary) hypertension: Secondary | ICD-10-CM | POA: Diagnosis not present

## 2017-12-24 DIAGNOSIS — N183 Chronic kidney disease, stage 3 unspecified: Secondary | ICD-10-CM

## 2017-12-24 DIAGNOSIS — Z1329 Encounter for screening for other suspected endocrine disorder: Secondary | ICD-10-CM | POA: Diagnosis not present

## 2017-12-24 DIAGNOSIS — Z7189 Other specified counseling: Secondary | ICD-10-CM | POA: Diagnosis not present

## 2017-12-24 DIAGNOSIS — I129 Hypertensive chronic kidney disease with stage 1 through stage 4 chronic kidney disease, or unspecified chronic kidney disease: Secondary | ICD-10-CM

## 2017-12-24 DIAGNOSIS — I2510 Atherosclerotic heart disease of native coronary artery without angina pectoris: Secondary | ICD-10-CM | POA: Diagnosis not present

## 2017-12-24 DIAGNOSIS — N400 Enlarged prostate without lower urinary tract symptoms: Secondary | ICD-10-CM | POA: Diagnosis not present

## 2017-12-24 DIAGNOSIS — Z0000 Encounter for general adult medical examination without abnormal findings: Secondary | ICD-10-CM | POA: Diagnosis not present

## 2017-12-24 DIAGNOSIS — E119 Type 2 diabetes mellitus without complications: Secondary | ICD-10-CM

## 2017-12-24 LAB — URINALYSIS, ROUTINE W REFLEX MICROSCOPIC
Bilirubin, UA: NEGATIVE
Ketones, UA: NEGATIVE
Leukocytes, UA: NEGATIVE
Nitrite, UA: NEGATIVE
Protein, UA: NEGATIVE
RBC, UA: NEGATIVE
Specific Gravity, UA: 1.01 (ref 1.005–1.030)
Urobilinogen, Ur: 0.2 mg/dL (ref 0.2–1.0)
pH, UA: 6.5 (ref 5.0–7.5)

## 2017-12-24 LAB — BAYER DCA HB A1C WAIVED: HB A1C (BAYER DCA - WAIVED): 7.1 % — ABNORMAL HIGH (ref <7.0)

## 2017-12-24 MED ORDER — BENAZEPRIL-HYDROCHLOROTHIAZIDE 20-12.5 MG PO TABS: 1.0000 | ORAL_TABLET | Freq: Every day | ORAL | 4 refills | Status: DC

## 2017-12-24 NOTE — Assessment & Plan Note (Signed)
A voluntary discussion about advance care planning including the explanation and discussion of advance directives was extensively discussed  with the patient.  Explanation about the health care proxy and Living will was reviewed and packet with forms with explanation of how to fill them out was given.  Time spent: encounter 16+ min       Individuals present: Pt.  

## 2017-12-24 NOTE — Assessment & Plan Note (Signed)
The current medical regimen is effective;  continue present plan and medications.  

## 2017-12-24 NOTE — Progress Notes (Signed)
BP (!) 144/75   Pulse 71   Ht 5\' 10"  (1.778 m)   Wt 193 lb (87.5 kg)   SpO2 98%   BMI 27.69 kg/m    Subjective:    Patient ID: Ryan Kirby, male    DOB: Aug 26, 1941, 77 y.o.   MRN: [id number]  HPI: Ryan Kirby is a 77 y.o. male  Chief Complaint  Patient presents with  . Follow-up    Saw Tiffany for AWV 12/16/2017  . Annual Exam  Patient concerned about some balance issues will rarely come and go last for just a few minutes.  Has not fallen or had issues. Discussed with patient referral for physical therapy and balance evaluation patient declined and at present will evaluate and if gets worse will have rechecked.  Diabetes no complaints no low blood sugar spells or issues. Blood pressure also doing well. Taking cholesterol medicines without problems along with stomach medications.  Relevant past medical, surgical, family and social history reviewed and updated as indicated. Interim medical history since our last visit reviewed. Allergies and medications reviewed and updated.  Review of Systems  Constitutional: Negative.   HENT: Negative.   Eyes: Negative.   Respiratory: Negative.   Cardiovascular: Negative.   Gastrointestinal: Negative.   Endocrine: Negative.   Genitourinary: Negative.   Musculoskeletal: Negative.   Skin: Negative.   Allergic/Immunologic: Negative.   Neurological: Negative.   Hematological: Negative.   Psychiatric/Behavioral: Negative.     Per HPI unless specifically indicated above     Objective:    BP (!) 144/75   Pulse 71   Ht 5\' 10"  (1.778 m)   Wt 193 lb (87.5 kg)   SpO2 98%   BMI 27.69 kg/m   Wt Readings from Last 3 Encounters:  12/24/17 193 lb (87.5 kg)  12/16/17 193 lb 9.6 oz (87.8 kg)  09/23/17 196 lb (88.9 kg)    Physical Exam  Constitutional: He is oriented to person, place, and time. He appears well-developed and well-nourished.  HENT:  Head: Normocephalic and atraumatic.  Right Ear: External ear normal.  Left Ear:  External ear normal.  Eyes: Pupils are equal, round, and reactive to light. Conjunctivae and EOM are normal.  Neck: Normal range of motion. Neck supple.  Cardiovascular: Normal rate, regular rhythm, normal heart sounds and intact distal pulses.  Pulmonary/Chest: Effort normal and breath sounds normal.  Abdominal: Soft. Bowel sounds are normal. There is no splenomegaly or hepatomegaly.  Genitourinary: Rectum normal, prostate normal and penis normal.  Musculoskeletal: Normal range of motion.  Neurological: He is alert and oriented to person, place, and time. He has normal reflexes.  Skin: No rash noted. No erythema.  Psychiatric: He has a normal mood and affect. His behavior is normal. Judgment and thought content normal.    Results for orders placed or performed in visit on 09/23/17  Bayer DCA Hb A1c Waived  Result Value Ref Range   Bayer DCA Hb A1c Waived 6.8 <7.0 %      Assessment & Plan:   Problem List Items Addressed This Visit      Cardiovascular and Mediastinum   Essential hypertension    The current medical regimen is effective;  continue present plan and medications.       Relevant Medications   benazepril-hydrochlorthiazide (LOTENSIN HCT) 20-12.5 MG tablet   Other Relevant Orders   CBC with Differential/Platelet   Comprehensive metabolic panel   Lipid panel   Urinalysis, Routine w reflex microscopic   [facility]  Hb A1c Waived   CAD (coronary artery disease)    The current medical regimen is effective;  continue present plan and medications.       Relevant Medications   benazepril-hydrochlorthiazide (LOTENSIN HCT) 20-12.5 MG tablet   Other Relevant Orders   CBC with Differential/Platelet   Comprehensive metabolic panel   Lipid panel   Urinalysis, Routine w reflex microscopic   Bayer DCA Hb A1c Waived   Type 2 DM with CKD stage 3 and hypertension ([place]) - Primary    The current medical regimen is effective;  continue present plan and medications.        Relevant Medications   benazepril-hydrochlorthiazide (LOTENSIN HCT) 20-12.5 MG tablet   Other Relevant Orders   CBC with Differential/Platelet   Comprehensive metabolic panel   Lipid panel   Urinalysis, Routine w reflex microscopic   Bayer DCA Hb A1c Waived     Endocrine   Diabetes mellitus without complication ([place])    The current medical regimen is effective;  continue present plan and medications.       Relevant Medications   benazepril-hydrochlorthiazide (LOTENSIN HCT) 20-12.5 MG tablet     Genitourinary   BPH (benign prostatic hyperplasia)    The current medical regimen is effective;  continue present plan and medications.         Other   Hyperlipidemia    The current medical regimen is effective;  continue present plan and medications.       Relevant Medications   benazepril-hydrochlorthiazide (LOTENSIN HCT) 20-12.5 MG tablet   Other Relevant Orders   CBC with Differential/Platelet   Comprehensive metabolic panel   Lipid panel   Urinalysis, Routine w reflex microscopic   Bayer DCA Hb A1c Waived   Advanced care planning/counseling discussion    A voluntary discussion about advance care planning including the explanation and discussion of advance directives was extensively discussed  with the patient.  Explanation about the health care proxy and Living will was reviewed and packet with forms with explanation of how to fill them out was given.  .  Time spent: encounter 16+ min       Individuals present: Pt        Other Visit Diagnoses    Annual physical exam       Relevant Orders   CBC with Differential/Platelet   Comprehensive metabolic panel   Lipid panel   PSA   TSH   Urinalysis, Routine w reflex microscopic   Bayer DCA Hb A1c Waived   Prostate cancer screening       Relevant Orders   PSA   Thyroid disorder screen       Relevant Orders   TSH       Follow up plan: Return in about 3 months (around 03/25/2018) for Hemoglobin A1c.

## 2017-12-25 ENCOUNTER — Encounter: Payer: Self-pay | Admitting: Family Medicine

## 2017-12-25 LAB — CBC WITH DIFFERENTIAL/PLATELET
BASOS: 1 %
Basophils Absolute: 0.1 10*3/uL (ref 0.0–0.2)
EOS (ABSOLUTE): 0.3 10*3/uL (ref 0.0–0.4)
EOS: 5 %
HEMATOCRIT: 40.5 % (ref 37.5–51.0)
HEMOGLOBIN: 13.2 g/dL (ref 13.0–17.7)
IMMATURE GRANS (ABS): 0 10*3/uL (ref 0.0–0.1)
IMMATURE GRANULOCYTES: 0 %
LYMPHS: 18 %
Lymphocytes Absolute: 1 10*3/uL (ref 0.7–3.1)
MCH: 29.9 pg (ref 26.6–33.0)
MCHC: 32.6 g/dL (ref 31.5–35.7)
MCV: 92 fL (ref 79–97)
MONOCYTES: 7 %
Monocytes Absolute: 0.4 10*3/uL (ref 0.1–0.9)
Neutrophils Absolute: 3.9 10*3/uL (ref 1.4–7.0)
Neutrophils: 69 %
PLATELETS: 190 10*3/uL (ref 150–379)
RBC: 4.42 x10E6/uL (ref 4.14–5.80)
RDW: 13.9 % (ref 12.3–15.4)
WBC: 5.7 10*3/uL (ref 3.4–10.8)

## 2017-12-25 LAB — COMPREHENSIVE METABOLIC PANEL
ALBUMIN: 4.3 g/dL (ref 3.5–4.8)
ALT: 14 IU/L (ref 0–44)
AST: 22 IU/L (ref 0–40)
Albumin/Globulin Ratio: 2 (ref 1.2–2.2)
Alkaline Phosphatase: 77 IU/L (ref 39–117)
BUN / CREAT RATIO: 16 (ref 10–24)
BUN: 20 mg/dL (ref 8–27)
Bilirubin Total: 0.4 mg/dL (ref 0.0–1.2)
CALCIUM: 9.9 mg/dL (ref 8.6–10.2)
CO2: 26 mmol/L (ref 20–29)
Chloride: 97 mmol/L (ref 96–106)
Creatinine, Ser: 1.29 mg/dL — ABNORMAL HIGH (ref 0.76–1.27)
GFR, EST AFRICAN AMERICAN: 62 mL/min/{1.73_m2} (ref >59)
GFR, EST NON AFRICAN AMERICAN: 53 mL/min/{1.73_m2} — AB (ref >59)
GLOBULIN, TOTAL: 2.2 g/dL (ref 1.5–4.5)
Glucose: 263 mg/dL — ABNORMAL HIGH (ref 65–99)
POTASSIUM: 3.6 mmol/L (ref 3.5–5.2)
Sodium: 139 mmol/L (ref 134–144)
TOTAL PROTEIN: 6.5 g/dL (ref 6.0–8.5)

## 2017-12-25 LAB — PSA: PROSTATE SPECIFIC AG, SERUM: 1.8 ng/mL (ref 0.0–4.0)

## 2017-12-25 LAB — TSH: TSH: 2.24 u[IU]/mL (ref 0.450–4.500)

## 2017-12-25 LAB — LIPID PANEL
CHOL/HDL RATIO: 2.7 ratio (ref 0.0–5.0)
Cholesterol, Total: 125 mg/dL (ref 100–199)
HDL: 46 mg/dL (ref >39)
LDL Calculated: 52 mg/dL (ref 0–99)
Triglycerides: 135 mg/dL (ref 0–149)
VLDL CHOLESTEROL CAL: 27 mg/dL (ref 5–40)

## 2018-03-14 DIAGNOSIS — M25552 Pain in left hip: Secondary | ICD-10-CM | POA: Diagnosis not present

## 2018-03-14 DIAGNOSIS — M533 Sacrococcygeal disorders, not elsewhere classified: Secondary | ICD-10-CM | POA: Diagnosis not present

## 2018-03-27 ENCOUNTER — Encounter: Payer: Self-pay | Admitting: Family Medicine

## 2018-03-27 ENCOUNTER — Ambulatory Visit (INDEPENDENT_AMBULATORY_CARE_PROVIDER_SITE_OTHER): Payer: Medicare HMO | Admitting: Family Medicine

## 2018-03-27 VITALS — BP 138/70 | HR 71 | Temp 98.4°F | Ht 70.0 in | Wt 192.0 lb

## 2018-03-27 DIAGNOSIS — I10 Essential (primary) hypertension: Secondary | ICD-10-CM | POA: Diagnosis not present

## 2018-03-27 DIAGNOSIS — N183 Chronic kidney disease, stage 3 unspecified: Secondary | ICD-10-CM

## 2018-03-27 DIAGNOSIS — I2583 Coronary atherosclerosis due to lipid rich plaque: Secondary | ICD-10-CM

## 2018-03-27 DIAGNOSIS — E1122 Type 2 diabetes mellitus with diabetic chronic kidney disease: Secondary | ICD-10-CM | POA: Diagnosis not present

## 2018-03-27 DIAGNOSIS — I2510 Atherosclerotic heart disease of native coronary artery without angina pectoris: Secondary | ICD-10-CM | POA: Diagnosis not present

## 2018-03-27 DIAGNOSIS — I129 Hypertensive chronic kidney disease with stage 1 through stage 4 chronic kidney disease, or unspecified chronic kidney disease: Secondary | ICD-10-CM

## 2018-03-27 DIAGNOSIS — E119 Type 2 diabetes mellitus without complications: Secondary | ICD-10-CM

## 2018-03-27 LAB — BAYER DCA HB A1C WAIVED: HB A1C (BAYER DCA - WAIVED): 7.2 % — ABNORMAL HIGH (ref <7.0)

## 2018-03-27 NOTE — Assessment & Plan Note (Signed)
The current medical regimen is effective;  continue present plan and medications.  

## 2018-03-27 NOTE — Progress Notes (Signed)
BP 138/70 (BP Location: Left Arm)   Pulse 71   Temp 98.4 F (36.9 C) (Oral)   Ht 5\' 10"  (1.778 m)   Wt 192 lb (87.1 kg)   SpO2 97%   BMI 27.55 kg/m    Subjective:    Patient ID: Ryan Kirby, male    DOB: 1940/11/26, 77 y.o.   MRN: [id number]  HPI: Ryan Kirby is a 77 y.o. male  Chief Complaint  Patient presents with  . Diabetes  Patient follow-up diabetes doing well no complaints from medication taking with no low blood sugar spells and with good control. Blood pressure also doing well with multiple medications no complaints. Reflux stable with medications no complaints. Also cholesterol medicine no complaints.   Relevant past medical, surgical, family and social history reviewed and updated as indicated. Interim medical history since our last visit reviewed. Allergies and medications reviewed and updated.  Review of Systems  Constitutional: Negative.   Respiratory: Negative.   Cardiovascular: Negative.     Per HPI unless specifically indicated above     Objective:    BP 138/70 (BP Location: Left Arm)   Pulse 71   Temp 98.4 F (36.9 C) (Oral)   Ht 5\' 10"  (1.778 m)   Wt 192 lb (87.1 kg)   SpO2 97%   BMI 27.55 kg/m   Wt Readings from Last 3 Encounters:  03/27/18 192 lb (87.1 kg)  12/24/17 193 lb (87.5 kg)  12/16/17 193 lb 9.6 oz (87.8 kg)    Physical Exam  Constitutional: He is oriented to person, place, and time. He appears well-developed and well-nourished.  HENT:  Head: Normocephalic and atraumatic.  Eyes: Conjunctivae and EOM are normal.  Neck: Normal range of motion.  Cardiovascular: Normal rate, regular rhythm and normal heart sounds.  Pulmonary/Chest: Effort normal and breath sounds normal.  Musculoskeletal: Normal range of motion.  Neurological: He is alert and oriented to person, place, and time.  Skin: No erythema.  Psychiatric: He has a normal mood and affect. His behavior is normal. Judgment and thought content normal.    Results  for orders placed or performed in visit on 12/24/17  CBC with Differential/Platelet  Result Value Ref Range   WBC 5.7 3.4 - 10.8 x10E3/uL   RBC 4.42 4.14 - 5.80 x10E6/uL   Hemoglobin 13.2 13.0 - 17.7 g/dL   Hematocrit 40.5 37.5 - 51.0 %   MCV 92 79 - 97 fL   MCH 29.9 26.6 - 33.0 pg   MCHC 32.6 31.5 - 35.7 g/dL   RDW 13.9 12.3 - 15.4 %   Platelets 190 150 - 379 x10E3/uL   Neutrophils 69 Not Estab. %   Lymphs 18 Not Estab. %   Monocytes 7 Not Estab. %   Eos 5 Not Estab. %   Basos 1 Not Estab. %   Neutrophils Absolute 3.9 1.4 - 7.0 x10E3/uL   Lymphocytes Absolute 1.0 0.7 - 3.1 x10E3/uL   Monocytes Absolute 0.4 0.1 - 0.9 x10E3/uL   EOS (ABSOLUTE) 0.3 0.0 - 0.4 x10E3/uL   Basophils Absolute 0.1 0.0 - 0.2 x10E3/uL   Immature Granulocytes 0 Not Estab. %   Immature Grans (Abs) 0.0 0.0 - 0.1 x10E3/uL  Comprehensive metabolic panel  Result Value Ref Range   Glucose 263 (H) 65 - 99 mg/dL   BUN 20 8 - 27 mg/dL   Creatinine, Ser 1.29 (H) 0.76 - 1.27 mg/dL   GFR calc non Af Amer 53 (L) >59 mL/min/1.73   GFR  calc Af Amer 62 >59 mL/min/1.73   BUN/Creatinine Ratio 16 10 - 24   Sodium 139 134 - 144 mmol/L   Potassium 3.6 3.5 - 5.2 mmol/L   Chloride 97 96 - 106 mmol/L   CO2 26 20 - 29 mmol/L   Calcium 9.9 8.6 - 10.2 mg/dL   Total Protein 6.5 6.0 - 8.5 g/dL   Albumin 4.3 3.5 - 4.8 g/dL   Globulin, Total 2.2 1.5 - 4.5 g/dL   Albumin/Globulin Ratio 2.0 1.2 - 2.2   Bilirubin Total 0.4 0.0 - 1.2 mg/dL   Alkaline Phosphatase 77 39 - 117 IU/L   AST 22 0 - 40 IU/L   ALT 14 0 - 44 IU/L  Lipid panel  Result Value Ref Range   Cholesterol, Total 125 100 - 199 mg/dL   Triglycerides 135 0 - 149 mg/dL   HDL 46 >39 mg/dL   VLDL Cholesterol Cal 27 5 - 40 mg/dL   LDL Calculated 52 0 - 99 mg/dL   Chol/HDL Ratio 2.7 0.0 - 5.0 ratio  PSA  Result Value Ref Range   Prostate Specific Ag, Serum 1.8 0.0 - 4.0 ng/mL  TSH  Result Value Ref Range   TSH 2.240 0.450 - 4.500 uIU/mL  Urinalysis, Routine w  reflex microscopic  Result Value Ref Range   Specific Gravity, UA 1.010 1.005 - 1.030   pH, UA 6.5 5.0 - 7.5   Color, UA Yellow Yellow   Appearance Ur Clear Clear   Leukocytes, UA Negative Negative   Protein, UA Negative Negative/Trace   Glucose, UA 2+ (A) Negative   Ketones, UA Negative Negative   RBC, UA Negative Negative   Bilirubin, UA Negative Negative   Urobilinogen, Ur 0.2 0.2 - 1.0 mg/dL   Nitrite, UA Negative Negative  Bayer DCA Hb A1c Waived  Result Value Ref Range   HB A1C (BAYER DCA - WAIVED) 7.1 (H) <7.0 %      Assessment & Plan:   Problem List Items Addressed This Visit      Cardiovascular and Mediastinum   Essential hypertension    The current medical regimen is effective;  continue present plan and medications.       CAD (coronary artery disease)    The current medical regimen is effective;  continue present plan and medications.       Type 2 DM with CKD stage 3 and hypertension ([facility])    The current medical regimen is effective;  continue present plan and medications.         Endocrine   Diabetes mellitus without complication ([place]) - Primary   Relevant Orders   Bayer DCA Hb A1c Waived       Follow up plan: Return in about 3 months (around 06/27/2018) for Hemoglobin A1c, BMP,  Lipids, ALT, AST.

## 2018-07-02 ENCOUNTER — Encounter: Payer: Self-pay | Admitting: Family Medicine

## 2018-07-02 ENCOUNTER — Ambulatory Visit (INDEPENDENT_AMBULATORY_CARE_PROVIDER_SITE_OTHER): Payer: Medicare HMO | Admitting: Family Medicine

## 2018-07-02 VITALS — BP 136/70 | HR 66 | Temp 97.7°F | Ht 70.0 in | Wt 189.9 lb

## 2018-07-02 DIAGNOSIS — I129 Hypertensive chronic kidney disease with stage 1 through stage 4 chronic kidney disease, or unspecified chronic kidney disease: Secondary | ICD-10-CM | POA: Diagnosis not present

## 2018-07-02 DIAGNOSIS — E1122 Type 2 diabetes mellitus with diabetic chronic kidney disease: Secondary | ICD-10-CM | POA: Diagnosis not present

## 2018-07-02 DIAGNOSIS — N183 Chronic kidney disease, stage 3 unspecified: Secondary | ICD-10-CM | POA: Insufficient documentation

## 2018-07-02 DIAGNOSIS — Z23 Encounter for immunization: Secondary | ICD-10-CM

## 2018-07-02 DIAGNOSIS — I10 Essential (primary) hypertension: Secondary | ICD-10-CM

## 2018-07-02 DIAGNOSIS — N400 Enlarged prostate without lower urinary tract symptoms: Secondary | ICD-10-CM

## 2018-07-02 DIAGNOSIS — E782 Mixed hyperlipidemia: Secondary | ICD-10-CM | POA: Diagnosis not present

## 2018-07-02 LAB — LP+ALT+AST PICCOLO, WAIVED
ALT (SGPT) PICCOLO, WAIVED: 22 U/L (ref 10–47)
AST (SGOT) Piccolo, Waived: 29 U/L (ref 11–38)
Chol/HDL Ratio Piccolo,Waive: 2.7 mg/dL
Cholesterol Piccolo, Waived: 143 mg/dL (ref <200)
HDL CHOL PICCOLO, WAIVED: 54 mg/dL — AB (ref >59)
LDL Chol Calc Piccolo Waived: 65 mg/dL (ref <100)
TRIGLYCERIDES PICCOLO,WAIVED: 124 mg/dL (ref <150)
VLDL Chol Calc Piccolo,Waive: 25 mg/dL (ref <30)

## 2018-07-02 LAB — BAYER DCA HB A1C WAIVED: HB A1C: 6.7 % (ref <7.0)

## 2018-07-02 NOTE — Patient Instructions (Signed)
Td Vaccine (Tetanus and Diphtheria): What You Need to Know 1. Why get vaccinated? Tetanus  and diphtheria are very serious diseases. They are rare in the [place] today, but people who do become infected often have severe complications. Td vaccine is used to protect adolescents and adults from both of these diseases. Both tetanus and diphtheria are infections caused by bacteria. Diphtheria spreads from person to person through coughing or sneezing. Tetanus-causing bacteria enter the body through cuts, scratches, or wounds. TETANUS (lockjaw) causes painful muscle tightening and stiffness, usually all over the body.  It can lead to tightening of muscles in the head and neck so you can't open your mouth, swallow, or sometimes even breathe. Tetanus kills about 1 out of every 10 people who are infected even after receiving the best medical care.  DIPHTHERIA can cause a thick coating to form in the back of the throat.  It can lead to breathing problems, paralysis, heart failure, and death.  Before vaccines, as many as 200,000 cases of diphtheria and hundreds of cases of tetanus were reported in the [place] each year. Since vaccination began, reports of cases for both diseases have dropped by about 99%. 2. Td vaccine Td vaccine can protect adolescents and adults from tetanus and diphtheria. Td is usually given as a booster dose every 10 years but it can also be given earlier after a severe and dirty wound or burn. Another vaccine, called Tdap, which protects against pertussis in addition to tetanus and diphtheria, is sometimes recommended instead of Td vaccine. Your doctor or the person giving you the vaccine can give you more information. Td may safely be given at the same time as other vaccines. 3. Some people should not get this vaccine  A person who has ever had a life-threatening allergic reaction after a previous dose of any tetanus or diphtheria containing vaccine, OR has a severe  allergy to any part of this vaccine, should not get Td vaccine. Tell the person giving the vaccine about any severe allergies.  Talk to your doctor if you: ? had severe pain or swelling after any vaccine containing diphtheria or tetanus, ? ever had a condition called Guillain Barre Syndrome (GBS), ? aren't feeling well on the day the shot is scheduled. 4. What are the risks from Td vaccine? With any medicine, including vaccines, there is a chance of side effects. These are usually mild and go away on their own. Serious reactions are also possible but are rare. Most people who get Td vaccine do not have any problems with it. Mild problems following Td vaccine: (Did not interfere with activities)  Pain where the shot was given (about 8 people in 10)  Redness or swelling where the shot was given (about 1 person in 4)  Mild fever (rare)  Headache (about 1 person in 4)  Tiredness (about 1 person in 4)  Moderate problems following Td vaccine: (Interfered with activities, but did not require medical attention)  Fever over 102F (rare)  Severe problems following Td vaccine: (Unable to perform usual activities; required medical attention)  Swelling, severe pain, bleeding and/or redness in the arm where the shot was given (rare).  Problems that could happen after any vaccine:  People sometimes faint after a medical procedure, including vaccination. Sitting or lying down for about 15 minutes can help prevent fainting, and injuries caused by a fall. Tell your doctor if you feel dizzy, or have vision changes or ringing in the ears.  Some people get   severe pain in the shoulder and have difficulty moving the arm where a shot was given. This happens very rarely.  Any medication can cause a severe allergic reaction. Such reactions from a vaccine are very rare, estimated at fewer than 1 in a million doses, and would happen within a few minutes to a few hours after the vaccination. As with any  medicine, there is a very remote chance of a vaccine causing a serious injury or death. The safety of vaccines is always being monitored. For more information, visit: www.cdc.gov/vaccinesafety/ 5. What if there is a serious reaction? What should I look for? Look for anything that concerns you, such as signs of a severe allergic reaction, very high fever, or unusual behavior. Signs of a severe allergic reaction can include hives, swelling of the face and throat, difficulty breathing, a fast heartbeat, dizziness, and weakness. These would usually start a few minutes to a few hours after the vaccination. What should I do?  If you think it is a severe allergic reaction or other emergency that can't wait, call 9-1-1 or get the person to the nearest hospital. Otherwise, call your doctor.  Afterward, the reaction should be reported to the Vaccine Adverse Event Reporting System (VAERS). Your doctor might file this report, or you can do it yourself through the VAERS web site at www.vaers.hhs.gov, or by calling 1-800-822-7967. ? VAERS does not give medical advice. 6. The National Vaccine Injury Compensation Program The National Vaccine Injury Compensation Program (VICP) is a federal program that was created to compensate people who may have been injured by certain vaccines. Persons who believe they may have been injured by a vaccine can learn about the program and about filing a claim by calling 1-800-338-2382 or visiting the VICP website at www.hrsa.gov/vaccinecompensation. There is a time limit to file a claim for compensation. 7. How can I learn more?  Ask your doctor. He or she can give you the vaccine package insert or suggest other sources of information.  Call your local or state health department.  Contact the Centers for Disease Control and Prevention (CDC): ? Call 1-800-232-4636 (1-800-CDC-INFO) ? Visit CDC's website at www.cdc.gov/vaccines CDC Td Vaccine VIS (12/13/15) This information is  not intended to replace advice given to you by your health care provider. Make sure you discuss any questions you have with your health care provider. Document Released: 06/17/2006 Document Revised: 05/10/2016 Document Reviewed: 05/10/2016 Elsevier Interactive Patient Education  2017 [employer]vier Inc.  

## 2018-07-02 NOTE — Assessment & Plan Note (Signed)
The current medical regimen is effective;  continue present plan and medications.  

## 2018-07-02 NOTE — Progress Notes (Signed)
BP 136/70 (BP Location: Left Arm)   Pulse 66   Temp 97.7 F (36.5 C) (Oral)   Ht 5\' 10"  (1.778 m)   Wt 189 lb 14.4 oz (86.1 kg)   SpO2 98%   BMI 27.25 kg/m    Subjective:    Patient ID: Ryan Kirby, male    DOB: 1941-01-21, 78 y.o.   MRN: [id number]  HPI: Ryan Kirby is a 77 y.o. male  Chief Complaint  Patient presents with  . Diabetes  . Hypertension  . Hyperlipidemia   Patient all in all doing well primary concern today is arthritis especially of his right hip.  Is been told at some point may need hip replacement but he is not ready now.  Managing with Tylenol and Advil Aleve and doing okay. Diabetes doing well with no complaints blood pressure cholesterol also doing well no signs or symptoms of CKD.  Relevant past medical, surgical, family and social history reviewed and updated as indicated. Interim medical history since our last visit reviewed. Allergies and medications reviewed and updated.  Review of Systems  Constitutional: Negative.   Respiratory: Negative.   Cardiovascular: Negative.     Per HPI unless specifically indicated above     Objective:    BP 136/70 (BP Location: Left Arm)   Pulse 66   Temp 97.7 F (36.5 C) (Oral)   Ht 5\' 10"  (1.778 m)   Wt 189 lb 14.4 oz (86.1 kg)   SpO2 98%   BMI 27.25 kg/m   Wt Readings from Last 3 Encounters:  07/02/18 189 lb 14.4 oz (86.1 kg)  03/27/18 192 lb (87.1 kg)  12/24/17 193 lb (87.5 kg)    Physical Exam  Constitutional: He is oriented to person, place, and time. He appears well-developed and well-nourished.  HENT:  Head: Normocephalic and atraumatic.  Eyes: Conjunctivae and EOM are normal.  Neck: Normal range of motion.  Cardiovascular: Normal rate, regular rhythm and normal heart sounds.  Pulmonary/Chest: Effort normal and breath sounds normal.  Musculoskeletal: Normal range of motion.  Neurological: He is alert and oriented to person, place, and time.  Skin: No erythema.  Psychiatric: He has a  normal mood and affect. His behavior is normal. Judgment and thought content normal.    Results for orders placed or performed in visit on 03/27/18  Bayer DCA Hb A1c Waived  Result Value Ref Range   HB A1C (BAYER DCA - WAIVED) 7.2 (H) <7.0 %      Assessment & Plan:   Problem List Items Addressed This Visit      Cardiovascular and Mediastinum   Parenchymal renal hypertension    The current medical regimen is effective;  continue present plan and medications.         Endocrine   DM type 2 causing CKD stage 3 ([place]) - Primary    The current medical regimen is effective;  continue present plan and medications.       CKD stage 3 secondary to diabetes [facility])    The current medical regimen is effective;  continue present plan and medications.         Genitourinary   BPH (benign prostatic hyperplasia)    The current medical regimen is effective;  continue present plan and medications.         Other   Hyperlipidemia    The current medical regimen is effective;  continue present plan and medications.       Relevant Orders   LP+ALT+AST  [place], [place]    Other Visit Diagnoses    Need for Td vaccine       Relevant Orders   Td vaccine greater than or equal to 7yo preservative free IM (Completed)       Follow up plan: Return in about 6 months (around 01/01/2019) for Physical Exam, Hemoglobin A1c.

## 2018-07-03 ENCOUNTER — Encounter: Payer: Self-pay | Admitting: Family Medicine

## 2018-07-03 LAB — BASIC METABOLIC PANEL
BUN / CREAT RATIO: 21 (ref 10–24)
BUN: 24 mg/dL (ref 8–27)
CHLORIDE: 97 mmol/L (ref 96–106)
CO2: 25 mmol/L (ref 20–29)
CREATININE: 1.17 mg/dL (ref 0.76–1.27)
Calcium: 10.2 mg/dL (ref 8.6–10.2)
GFR calc Af Amer: 70 mL/min/{1.73_m2} (ref >59)
GFR calc non Af Amer: 60 mL/min/{1.73_m2} (ref >59)
GLUCOSE: 126 mg/dL — AB (ref 65–99)
POTASSIUM: 3.7 mmol/L (ref 3.5–5.2)
SODIUM: 140 mmol/L (ref 134–144)

## 2018-09-22 ENCOUNTER — Ambulatory Visit (INDEPENDENT_AMBULATORY_CARE_PROVIDER_SITE_OTHER): Payer: Medicare HMO | Admitting: Family Medicine

## 2018-09-22 ENCOUNTER — Encounter: Payer: Self-pay | Admitting: Family Medicine

## 2018-09-22 DIAGNOSIS — M25552 Pain in left hip: Secondary | ICD-10-CM

## 2018-09-22 DIAGNOSIS — M25559 Pain in unspecified hip: Secondary | ICD-10-CM | POA: Insufficient documentation

## 2018-09-22 NOTE — Assessment & Plan Note (Signed)
Discuss hip pain possible arthritis patient will use meloxicam which she has a prescription for and if not getting better will refer to orthopedics to further evaluate and consider x-rays etc.

## 2018-09-22 NOTE — Progress Notes (Signed)
BP (!) 149/77   Pulse 74   Temp 97.9 F (36.6 C) (Oral)   Wt 193 lb 3.2 oz (87.6 kg)   SpO2 96%   BMI 27.72 kg/m    Subjective:    Patient ID: Ryan Kirby, male    DOB: 04-Feb-1941, 78 y.o.   MRN: [id number]  HPI: Ryan Kirby is a 78 y.o. male  Chief Complaint  Patient presents with  . Hip Pain     a week - 10 days. Constant.    Patient with some left hip back pain discomfort no known specific trauma or activity has been ongoing for little over a week.  Has some leftover oxycodone from 5 years ago from surgery that he took that gave him a little relief Tylenol Advil Aleve did not do much. Patient also with nonspecific red spot on his mid back area which she scratches any scratches and stays irritated. Relevant past medical, surgical, family and social history reviewed and updated as indicated. Interim medical history since our last visit reviewed. Allergies and medications reviewed and updated.  Review of Systems  Constitutional: Negative.   Respiratory: Negative.   Cardiovascular: Negative.     Per HPI unless specifically indicated above     Objective:    BP (!) 149/77   Pulse 74   Temp 97.9 F (36.6 C) (Oral)   Wt 193 lb 3.2 oz (87.6 kg)   SpO2 96%   BMI 27.72 kg/m   Wt Readings from Last 3 Encounters:  09/22/18 193 lb 3.2 oz (87.6 kg)  07/02/18 189 lb 14.4 oz (86.1 kg)  03/27/18 192 lb (87.1 kg)    Physical Exam Constitutional:      Appearance: He is well-developed.  HENT:     Head: Normocephalic and atraumatic.  Eyes:     Conjunctiva/sclera: Conjunctivae normal.  Neck:     Musculoskeletal: Normal range of motion.  Cardiovascular:     Rate and Rhythm: Normal rate and regular rhythm.     Heart sounds: Normal heart sounds.  Pulmonary:     Effort: Pulmonary effort is normal.     Breath sounds: Normal breath sounds.  Musculoskeletal: Normal range of motion.     Comments: Hip area clear no skin lesions no evidence of shingles.  Some tenderness  through hip area especially with range of motion and some decreased range of motion  Skin:    Findings: No erythema.     Comments: Inflamed spot on his back patient will leave alone and if still problematic will consider biopsy.  And/or dermatology referral.  Neurological:     Mental Status: He is alert and oriented to person, place, and time.  Psychiatric:        Behavior: Behavior normal.        Thought Content: Thought content normal.        Judgment: Judgment normal.     Results for orders placed or performed in visit on 76/28/31  Basic metabolic panel  Result Value Ref Range   Glucose 126 (H) 65 - 99 mg/dL   BUN 24 8 - 27 mg/dL   Creatinine, Ser 1.17 0.76 - 1.27 mg/dL   GFR calc non Af Amer 60 >59 mL/min/1.73   GFR calc Af Amer 70 >59 mL/min/1.73   BUN/Creatinine Ratio 21 10 - 24   Sodium 140 134 - 144 mmol/L   Potassium 3.7 3.5 - 5.2 mmol/L   Chloride 97 96 - 106 mmol/L   CO2 25 20 -  29 mmol/L   Calcium 10.2 8.6 - 10.2 mg/dL  Bayer DCA Hb A1c Waived  Result Value Ref Range   HB A1C (BAYER DCA - WAIVED) 6.7 <7.0 %  LP+ALT+AST Piccolo, Waived  Result Value Ref Range   ALT (SGPT) Piccolo, Waived 22 10 - 47 U/L   AST (SGOT) Piccolo, Waived 29 11 - 38 U/L   Cholesterol Piccolo, Waived 143 <200 mg/dL   HDL Chol Piccolo, Waived 54 (L) >59 mg/dL   Triglycerides Piccolo,Waived 124 <150 mg/dL   Chol/HDL Ratio Piccolo,Waive 2.7 mg/dL   LDL Chol Calc Piccolo Waived 65 <100 mg/dL   VLDL Chol Calc Piccolo,Waive 25 <30 mg/dL      Assessment & Plan:   Problem List Items Addressed This Visit      Other   Hip pain    Discuss hip pain possible arthritis patient will use meloxicam which she has a prescription for and if not getting better will refer to orthopedics to further evaluate and consider x-rays etc.         Skin lesion back will observe and if becomes problematic will further evaluate.  Follow up plan: Return if symptoms worsen or fail to improve.

## 2018-09-30 DIAGNOSIS — M25552 Pain in left hip: Secondary | ICD-10-CM | POA: Diagnosis not present

## 2018-09-30 DIAGNOSIS — N189 Chronic kidney disease, unspecified: Secondary | ICD-10-CM | POA: Diagnosis not present

## 2018-09-30 DIAGNOSIS — Z8639 Personal history of other endocrine, nutritional and metabolic disease: Secondary | ICD-10-CM | POA: Diagnosis not present

## 2018-10-02 ENCOUNTER — Ambulatory Visit: Payer: Medicare HMO | Admitting: Family Medicine

## 2018-10-03 DIAGNOSIS — R112 Nausea with vomiting, unspecified: Secondary | ICD-10-CM | POA: Diagnosis not present

## 2018-10-03 DIAGNOSIS — K8020 Calculus of gallbladder without cholecystitis without obstruction: Secondary | ICD-10-CM | POA: Diagnosis not present

## 2018-10-03 DIAGNOSIS — K5900 Constipation, unspecified: Secondary | ICD-10-CM | POA: Diagnosis not present

## 2018-10-03 DIAGNOSIS — M25552 Pain in left hip: Secondary | ICD-10-CM | POA: Diagnosis not present

## 2018-10-03 DIAGNOSIS — Z885 Allergy status to narcotic agent status: Secondary | ICD-10-CM | POA: Diagnosis not present

## 2018-10-03 DIAGNOSIS — K5790 Diverticulosis of intestine, part unspecified, without perforation or abscess without bleeding: Secondary | ICD-10-CM | POA: Diagnosis not present

## 2018-10-03 DIAGNOSIS — E1165 Type 2 diabetes mellitus with hyperglycemia: Secondary | ICD-10-CM | POA: Diagnosis not present

## 2018-10-03 DIAGNOSIS — R000 Tachycardia, unspecified: Secondary | ICD-10-CM | POA: Diagnosis not present

## 2018-10-03 DIAGNOSIS — N179 Acute kidney failure, unspecified: Secondary | ICD-10-CM | POA: Diagnosis not present

## 2018-10-04 DIAGNOSIS — K5790 Diverticulosis of intestine, part unspecified, without perforation or abscess without bleeding: Secondary | ICD-10-CM | POA: Diagnosis not present

## 2018-10-04 DIAGNOSIS — E1165 Type 2 diabetes mellitus with hyperglycemia: Secondary | ICD-10-CM | POA: Diagnosis not present

## 2018-10-04 DIAGNOSIS — M25552 Pain in left hip: Secondary | ICD-10-CM | POA: Diagnosis not present

## 2018-10-04 DIAGNOSIS — Z885 Allergy status to narcotic agent status: Secondary | ICD-10-CM | POA: Diagnosis not present

## 2018-10-04 DIAGNOSIS — K8020 Calculus of gallbladder without cholecystitis without obstruction: Secondary | ICD-10-CM | POA: Diagnosis not present

## 2018-10-04 DIAGNOSIS — N179 Acute kidney failure, unspecified: Secondary | ICD-10-CM | POA: Diagnosis not present

## 2018-10-04 MED ORDER — LIDOCAINE 5 % EX PTCH
1.00 | MEDICATED_PATCH | CUTANEOUS | Status: DC
Start: 2018-10-04 — End: 2018-10-04

## 2018-10-04 MED ORDER — MORPHINE SULFATE (PF) 4 MG/ML IV SOLN
4.00 | INTRAVENOUS | Status: DC
Start: ? — End: 2018-10-04

## 2018-10-04 MED ORDER — PRAVASTATIN SODIUM 20 MG PO TABS
40.00 | ORAL_TABLET | ORAL | Status: DC
Start: 2018-10-04 — End: 2018-10-04

## 2018-10-04 MED ORDER — DOCUSATE SODIUM 100 MG PO CAPS
100.00 | ORAL_CAPSULE | ORAL | Status: DC
Start: 2018-10-04 — End: 2018-10-04

## 2018-10-04 MED ORDER — POLYETHYLENE GLYCOL 3350 17 G PO PACK
17.00 | PACK | ORAL | Status: DC
Start: 2018-10-04 — End: 2018-10-04

## 2018-10-04 MED ORDER — SODIUM CHLORIDE FLUSH 0.9 % IV SOLN
5.00 | INTRAVENOUS | Status: DC
Start: 2018-10-04 — End: 2018-10-04

## 2018-10-04 MED ORDER — CARVEDILOL 25 MG PO TABS
50.00 | ORAL_TABLET | ORAL | Status: DC
Start: 2018-10-04 — End: 2018-10-04

## 2018-10-04 MED ORDER — AMLODIPINE BESYLATE 5 MG PO TABS
10.00 | ORAL_TABLET | ORAL | Status: DC
Start: 2018-10-04 — End: 2018-10-04

## 2018-10-04 MED ORDER — ACETAMINOPHEN 325 MG PO TABS
650.00 | ORAL_TABLET | ORAL | Status: DC
Start: ? — End: 2018-10-04

## 2018-10-06 ENCOUNTER — Ambulatory Visit (INDEPENDENT_AMBULATORY_CARE_PROVIDER_SITE_OTHER): Payer: Medicare HMO | Admitting: Family Medicine

## 2018-10-06 ENCOUNTER — Encounter: Payer: Self-pay | Admitting: Family Medicine

## 2018-10-06 VITALS — BP 135/82 | HR 76 | Temp 99.0°F | Wt 189.8 lb

## 2018-10-06 DIAGNOSIS — R059 Cough, unspecified: Secondary | ICD-10-CM

## 2018-10-06 DIAGNOSIS — R05 Cough: Secondary | ICD-10-CM

## 2018-10-06 DIAGNOSIS — M25552 Pain in left hip: Secondary | ICD-10-CM

## 2018-10-06 NOTE — Assessment & Plan Note (Signed)
Has seen PCP, urgent care and ER. Offered referral back to see othopedics. He states that this was already done. If he hasn't heard anything in the next week or so, let us know. Call with any concerns. Continue meloxicam and baclofen.

## 2018-10-06 NOTE — Progress Notes (Signed)
BP 135/82 (BP Location: Left Arm, Cuff Size: Normal)   Pulse 76   Temp 99 F (37.2 C) (Oral)   Wt 189 lb 12.8 oz (86.1 kg)   SpO2 98%   BMI 27.23 kg/m    Subjective:    Patient ID: Ryan Kirby, male    DOB: [birth year], 78 y.o.   MRN: [id number]  HPI: Ryan Kirby is a 78 y.o. male  Chief Complaint  Patient presents with  . Cough    pt states he wants to just be sure he doesnt have pneumonia because he spent a long time at [facility] ER   ER FOLLOW UP Time since discharge: 4 days Hospital/facility: [facility] Diagnosis: L hip pain Procedures/tests: x-ray, CT, labs Consultants: None New medications: None Discharge instructions:  Follow up here, reestablish with orthopedics, PT Status: stable   Was in the ER at [facility].  UPPER RESPIRATORY TRACT INFECTION Worst symptom: cough on Friday and Saturday Fever: no Cough: yes Shortness of breath: no Wheezing: no Chest pain: no Chest tightness: no Chest congestion: no Nasal congestion: yes Runny nose: no Post nasal drip: no Sneezing: no Sore throat: no Swollen glands: no Sinus pressure: no Headache: no Face pain: no Toothache: no Ear pain: no  Ear pressure: no  Eyes red/itching:no Eye drainage/crusting: no  Vomiting: no Rash: no Fatigue: yes Sick contacts: yes Strep contacts: no  Context: stable Recurrent sinusitis: no Relief with OTC cold/cough medications: no  Treatments attempted: none    Relevant past medical, surgical, family and social history reviewed and updated as indicated. Interim medical history since our last visit reviewed. Allergies and medications reviewed and updated.  Review of Systems  Constitutional: Negative.   HENT: Positive for congestion. Negative for dental problem, drooling, ear discharge, ear pain, facial swelling, hearing loss, mouth sores, nosebleeds, postnasal drip, rhinorrhea, sinus pressure, sinus pain, sneezing, sore throat, tinnitus, trouble swallowing and voice change.   Eyes:  Negative.   Respiratory: Positive for cough. Negative for apnea, choking, chest tightness, shortness of breath, wheezing and stridor.   Cardiovascular: Negative.   Gastrointestinal: Negative.   Musculoskeletal: Positive for arthralgias. Negative for back pain, gait problem, joint swelling, myalgias, neck pain and neck stiffness.  Skin: Negative.   Psychiatric/Behavioral: Negative.     Per HPI unless specifically indicated above     Objective:    BP 135/82 (BP Location: Left Arm, Cuff Size: Normal)   Pulse 76   Temp 99 F (37.2 C) (Oral)   Wt 189 lb 12.8 oz (86.1 kg)   SpO2 98%   BMI 27.23 kg/m   Wt Readings from Last 3 Encounters:  10/06/18 189 lb 12.8 oz (86.1 kg)  09/22/18 193 lb 3.2 oz (87.6 kg)  07/02/18 189 lb 14.4 oz (86.1 kg)    Physical Exam Vitals signs and nursing note reviewed.  Constitutional:      General: He is not in acute distress.    Appearance: Normal appearance. He is not ill-appearing, toxic-appearing or diaphoretic.  HENT:     Head: Normocephalic and atraumatic.     Right Ear: External ear normal.     Left Ear: External ear normal.     Nose: Nose normal.     Mouth/Throat:     Mouth: Mucous membranes are moist.     Pharynx: Oropharynx is clear.  Eyes:     General: No scleral icterus.       Right eye: No discharge.        Left  eye: No discharge.     Extraocular Movements: Extraocular movements intact.     Conjunctiva/sclera: Conjunctivae normal.     Pupils: Pupils are equal, round, and reactive to light.  Neck:     Musculoskeletal: Normal range of motion and neck supple.  Cardiovascular:     Rate and Rhythm: Normal rate and regular rhythm.     Pulses: Normal pulses.     Heart sounds: Normal heart sounds. No murmur. No friction rub. No gallop.   Pulmonary:     Effort: Pulmonary effort is normal. No respiratory distress.     Breath sounds: Normal breath sounds. No stridor. No wheezing, rhonchi or rales.  Chest:     Chest wall: No tenderness.    Musculoskeletal: Normal range of motion.  Skin:    General: Skin is warm and dry.     Capillary Refill: Capillary refill takes less than 2 seconds.     Coloration: Skin is not jaundiced or pale.     Findings: No bruising, erythema, lesion or rash.  Neurological:     General: No focal deficit present.     Mental Status: He is alert and oriented to person, place, and time. Mental status is at baseline.  Psychiatric:        Mood and Affect: Mood normal.        Behavior: Behavior normal.        Thought Content: Thought content normal.        Judgment: Judgment normal.     Results for orders placed or performed in visit on 32/44/01  Basic metabolic panel  Result Value Ref Range   Glucose 126 (H) 65 - 99 mg/dL   BUN 24 8 - 27 mg/dL   Creatinine, Ser 1.17 0.76 - 1.27 mg/dL   GFR calc non Af Amer 60 >59 mL/min/1.73   GFR calc Af Amer 70 >59 mL/min/1.73   BUN/Creatinine Ratio 21 10 - 24   Sodium 140 134 - 144 mmol/L   Potassium 3.7 3.5 - 5.2 mmol/L   Chloride 97 96 - 106 mmol/L   CO2 25 20 - 29 mmol/L   Calcium 10.2 8.6 - 10.2 mg/dL  Bayer DCA Hb A1c Waived  Result Value Ref Range   HB A1C (BAYER DCA - WAIVED) 6.7 <7.0 %  LP+ALT+AST Piccolo, Waived  Result Value Ref Range   ALT (SGPT) Piccolo, Waived 22 10 - 47 U/L   AST (SGOT) Piccolo, Waived 29 11 - 38 U/L   Cholesterol Piccolo, Waived 143 <200 mg/dL   HDL Chol Piccolo, Waived 54 (L) >59 mg/dL   Triglycerides Piccolo,Waived 124 <150 mg/dL   Chol/HDL Ratio Piccolo,Waive 2.7 mg/dL   LDL Chol Calc Piccolo Waived 65 <100 mg/dL   VLDL Chol Calc Piccolo,Waive 25 <30 mg/dL      Assessment & Plan:   Problem List Items Addressed This Visit      Other   Hip pain - Primary    Has seen PCP, urgent care and ER. Offered referral back to see othopedics. He states that this was already done. If he hasn't heard anything in the next week or so, let us know. Call with any concerns. Continue meloxicam and baclofen.       Other Visit  Diagnoses    Cough       Lungs clear. No fevers. Normal exam. No sign of illness. Call with any concerns.        Follow up plan: No follow-ups on file.

## 2018-10-09 ENCOUNTER — Telehealth: Payer: Self-pay | Admitting: Family Medicine

## 2018-10-09 DIAGNOSIS — M25552 Pain in left hip: Secondary | ICD-10-CM

## 2018-10-09 NOTE — Telephone Encounter (Signed)
Copied from [place] [phone]. Topic: Referral - Request for Referral >> Oct 09, 2018  8:53 AM Margot Ables wrote: Has patient seen PCP for this complaint? yes *If NO, is insurance requiring patient see PCP for this issue before PCP can refer them? Referral for which specialty: Ortho Preferred provider/office: Dr. Edythe Lynn? Reason for referral: pt states that he hasn't heard anything yet and he discussed referral with Dr. Wynetta Emery on Monday 2/3. Please advise.

## 2018-10-15 DIAGNOSIS — M533 Sacrococcygeal disorders, not elsewhere classified: Secondary | ICD-10-CM | POA: Diagnosis not present

## 2018-10-15 DIAGNOSIS — M25552 Pain in left hip: Secondary | ICD-10-CM | POA: Diagnosis not present

## 2018-10-21 DIAGNOSIS — M461 Sacroiliitis, not elsewhere classified: Secondary | ICD-10-CM | POA: Diagnosis not present

## 2018-10-28 ENCOUNTER — Other Ambulatory Visit: Payer: Self-pay | Admitting: Physical Medicine and Rehabilitation

## 2018-10-28 DIAGNOSIS — M5136 Other intervertebral disc degeneration, lumbar region: Secondary | ICD-10-CM | POA: Diagnosis not present

## 2018-10-28 DIAGNOSIS — M5416 Radiculopathy, lumbar region: Secondary | ICD-10-CM | POA: Diagnosis not present

## 2018-10-29 ENCOUNTER — Emergency Department
Admission: EM | Admit: 2018-10-29 | Discharge: 2018-10-29 | Disposition: A | Discharge status: Home / Self Care | Payer: Medicare HMO | Attending: Emergency Medicine | Admitting: Emergency Medicine

## 2018-10-29 ENCOUNTER — Other Ambulatory Visit: Payer: Self-pay

## 2018-10-29 ENCOUNTER — Emergency Department: Payer: Medicare HMO

## 2018-10-29 DIAGNOSIS — K5900 Constipation, unspecified: Secondary | ICD-10-CM

## 2018-10-29 DIAGNOSIS — Z951 Presence of aortocoronary bypass graft: Secondary | ICD-10-CM | POA: Diagnosis not present

## 2018-10-29 DIAGNOSIS — N183 Chronic kidney disease, stage 3 (moderate): Secondary | ICD-10-CM | POA: Diagnosis not present

## 2018-10-29 DIAGNOSIS — E1122 Type 2 diabetes mellitus with diabetic chronic kidney disease: Secondary | ICD-10-CM | POA: Diagnosis not present

## 2018-10-29 DIAGNOSIS — R339 Retention of urine, unspecified: Secondary | ICD-10-CM | POA: Diagnosis not present

## 2018-10-29 DIAGNOSIS — I2510 Atherosclerotic heart disease of native coronary artery without angina pectoris: Secondary | ICD-10-CM | POA: Diagnosis not present

## 2018-10-29 LAB — URINALYSIS, COMPLETE (UACMP) WITH MICROSCOPIC
Bacteria, UA: NONE SEEN
Bilirubin Urine: NEGATIVE
Glucose, UA: >=500 mg/dL — AB
Ketones, ur: NEGATIVE mg/dL
Leukocytes,Ua: NEGATIVE
NITRITE: NEGATIVE
Protein, ur: NEGATIVE mg/dL
Specific Gravity, Urine: 1.009 (ref 1.005–1.030)
Squamous Epithelial / HPF: NONE SEEN (ref 0–5)
pH: 6 (ref 5.0–8.0)

## 2018-10-29 MED ORDER — SENNA 8.6 MG PO TABS: 2.0000 | ORAL_TABLET | Freq: Every evening | ORAL | 0 refills | Status: DC | PRN

## 2018-10-29 MED ORDER — DOCUSATE SODIUM 100 MG PO CAPS: 100.0000 mg | ORAL_CAPSULE | Freq: Two times a day (BID) | ORAL | 2 refills | Status: AC

## 2018-10-29 NOTE — ED Notes (Signed)
Pt verbalized understanding of d/c instructions,rx, and f/u care. No further questions at this time. Pt assisted to the exit via wheelchair.

## 2018-10-29 NOTE — ED Notes (Addendum)
Pt had large, hard BM. States that he feels much relief.

## 2018-10-29 NOTE — ED Provider Notes (Signed)
[facility] Emergency Department Provider Note       Time seen: ----------------------------------------- 5:15 PM on 10/29/2018 -----------------------------------------   I have reviewed the triage vital signs and the nursing notes.  HISTORY   Chief Complaint Constipation and Urinary Retention    HPI Ryan Kirby is a 78 y.o. male with a history of coronary artery disease, chronic kidney disease, diabetes, GERD, hyperlipidemia who presents to the ED for constipation and urinary retention.  Patient states he is only able to urinate very small amount and that he has pressure in his bladder.  He has taken 3-4 dose of MiraLAX without any improvement.  He denies fevers, chills or other complaints.  Past Medical History:  Diagnosis Date  . Allergy   . CAD (coronary artery disease)   . Chronic kidney disease    pt is not on dialysis  . Diabetes mellitus without complication ([place])   . GERD (gastroesophageal reflux disease)   . Hyperlipidemia     Patient Active Problem List   Diagnosis Date Noted  . Hip pain 09/22/2018  . CKD stage 3 secondary to diabetes ([place]) 07/02/2018  . Advanced care planning/counseling discussion 12/24/2017  . GERD (gastroesophageal reflux disease) 12/06/2016  . BPH (benign prostatic hyperplasia) 12/06/2016  . DM type 2 causing CKD stage 3 ([place]) 06/02/2015  . Hyperlipidemia   . Parenchymal renal hypertension   . CAD (coronary artery disease)   . Heart murmur 03/04/2014  . 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  . Intermittent alternating exotropia 12/17/2012  . Preglaucoma 12/17/2012    Past Surgical History:  Procedure Laterality Date  . BOWEL RESECTION  2004  . CORONARY ARTERY BYPASS GRAFT    . FINGER AMPUTATION Left 2011   5th  . KNEE ARTHROSCOPY Right 09/12/2015   Procedure: ARTHROSCOPY KNEE, LATERAL MENISECTOMY, CHONDROPLASTY;  Surgeon: Dereck Leep, MD;  Location: [facility] ORS;  Service: Orthopedics;  Laterality: Right;     Allergies Oxycontin [oxycodone]  Social History Social History   Tobacco Use  . Smoking status: Never Smoker  . Smokeless tobacco: Never Used  Substance Use Topics  . Alcohol use: No    Alcohol/week: 0.0 standard drinks  . Drug use: No   Review of Systems Constitutional: Negative for fever. Cardiovascular: Negative for chest pain. Respiratory: Negative for shortness of breath. Gastrointestinal: Positive for constipation Genitourinary: Positive for urinary retention Skin: Negative for rash. Neurological: Negative for headaches, focal weakness or numbness.  All systems negative/normal/unremarkable except as stated in the HPI  ____________________________________________   PHYSICAL EXAM:  VITAL SIGNS: ED Triage Vitals [10/29/18 1626]  Enc Vitals Group     BP 122/69     Pulse Rate 78     Resp      Temp 97.7 F (36.5 C)     Temp Source Oral     SpO2 98 %     Weight 185 lb (83.9 kg)     Height 5\' 11"  (1.803 m)     Head Circumference      Peak Flow      Pain Score      Pain Loc      Pain Edu?      Excl. in [place]?     Constitutional: Alert and oriented. Well appearing and in no distress. Cardiovascular: Normal rate, regular rhythm. No murmurs, rubs, or gallops. Respiratory: Normal respiratory effort without tachypnea nor retractions. Breath sounds are clear and equal bilaterally. No wheezes/rales/rhonchi. Gastrointestinal: Soft and nontender. Normal bowel sounds Musculoskeletal: Nontender  with normal range of motion in extremities. No lower extremity tenderness nor edema. Neurologic:  Normal speech and language. No gross focal neurologic deficits are appreciated.  Skin:  Skin is warm, dry and intact. No rash noted. Psychiatric: Mood and affect are normal. Speech and behavior are normal.  ____________________________________________  ED COURSE:  As part of my medical decision making, I reviewed the following data within the [facility] History  obtained from family if available, nursing notes, old chart and ekg, as well as notes from prior ED visits. Patient presented for urinary retention and constipation, we will assess with labs and imaging as indicated at this time.   Procedures ____________________________________________   Labs Reviewed  URINALYSIS, COMPLETE (UACMP) WITH MICROSCOPIC - Abnormal; Notable for the following components:      Result Value   Color, Urine STRAW (*)    APPearance CLEAR (*)    Glucose, UA >=500 (*)    Hgb urine dipstick SMALL (*)    All other components within normal limits    RADIOLOGY  KUB reveals no acute findings  ____________________________________________   DIFFERENTIAL DIAGNOSIS   Constipation, dehydration, urinary retention, BPH  FINAL ASSESSMENT AND PLAN  Urinary retention, constipation   Plan: The patient had presented for urinary retention and constipation. Patient's imaging did not reveal any acute process.  He was given a soapsuds enema and will be cleared for outpatient follow-up for both urinary retention and constipation.   Laurence Aly, MD    Note: This note was generated in part or whole with voice recognition software. Voice recognition is usually quite accurate but there are transcription errors that can and very often do occur. I apologize for any typographical errors that were not detected and corrected.     Earleen Newport, MD 10/29/18 [id number]

## 2018-10-29 NOTE — ED Triage Notes (Signed)
Pt reporting constipation x 3 days and urinary retention with only being able to urinate very small amounts. Pt reporting pressure in his bladder. Three or four doses of mirilax without relief.

## 2018-11-03 ENCOUNTER — Other Ambulatory Visit: Payer: Self-pay

## 2018-11-03 DIAGNOSIS — Z79899 Other long term (current) drug therapy: Secondary | ICD-10-CM | POA: Diagnosis not present

## 2018-11-03 DIAGNOSIS — E1122 Type 2 diabetes mellitus with diabetic chronic kidney disease: Secondary | ICD-10-CM | POA: Diagnosis not present

## 2018-11-03 DIAGNOSIS — N183 Chronic kidney disease, stage 3 (moderate): Secondary | ICD-10-CM | POA: Diagnosis not present

## 2018-11-03 DIAGNOSIS — N3000 Acute cystitis without hematuria: Secondary | ICD-10-CM | POA: Insufficient documentation

## 2018-11-03 DIAGNOSIS — I2510 Atherosclerotic heart disease of native coronary artery without angina pectoris: Secondary | ICD-10-CM | POA: Insufficient documentation

## 2018-11-03 DIAGNOSIS — R300 Dysuria: Secondary | ICD-10-CM | POA: Diagnosis present

## 2018-11-03 LAB — URINALYSIS, COMPLETE (UACMP) WITH MICROSCOPIC
Bilirubin Urine: NEGATIVE
GLUCOSE, UA: NEGATIVE mg/dL
Ketones, ur: NEGATIVE mg/dL
NITRITE: NEGATIVE
Protein, ur: 100 mg/dL — AB
RBC / HPF: >50 RBC/hpf — ABNORMAL HIGH (ref 0–5)
SPECIFIC GRAVITY, URINE: 1.028 (ref 1.005–1.030)
Squamous Epithelial / HPF: NONE SEEN (ref 0–5)
WBC, UA: >50 WBC/hpf — ABNORMAL HIGH (ref 0–5)
pH: 5 (ref 5.0–8.0)

## 2018-11-03 NOTE — ED Triage Notes (Signed)
Pt to the ER for catheter problems. Pt was seen here on the 26th for urinary rentention. Pt had a catheter placed and now he has burning. Pt reports urine is still draining. Pt has a urology appt on Friday.

## 2018-11-04 ENCOUNTER — Emergency Department
Admission: EM | Admit: 2018-11-04 | Discharge: 2018-11-04 | Disposition: A | Discharge status: Home / Self Care | Payer: Medicare HMO | Attending: Emergency Medicine | Admitting: Emergency Medicine

## 2018-11-04 DIAGNOSIS — N3000 Acute cystitis without hematuria: Secondary | ICD-10-CM

## 2018-11-04 MED ORDER — PHENAZOPYRIDINE HCL 200 MG PO TABS
95.0000 mg | ORAL_TABLET | Freq: Once | ORAL | Status: AC
  Administered 2018-11-04: 200 mg via ORAL

## 2018-11-04 MED ORDER — PHENAZOPYRIDINE HCL 100 MG PO TABS: 100.0000 mg | ORAL_TABLET | Freq: Three times a day (TID) | ORAL | 0 refills | Status: DC | PRN

## 2018-11-04 MED ORDER — CEPHALEXIN 500 MG PO CAPS
500.0000 mg | ORAL_CAPSULE | Freq: Once | ORAL | Status: AC
  Administered 2018-11-04: 500 mg via ORAL
  Filled 2018-11-04: qty 1

## 2018-11-04 MED ORDER — CEPHALEXIN 500 MG PO CAPS: 500.0000 mg | ORAL_CAPSULE | Freq: Two times a day (BID) | ORAL | 0 refills | Status: AC

## 2018-11-04 NOTE — ED Provider Notes (Signed)
[facility] Emergency Department Provider Note    First MD Initiated Contact with Patient 11/04/18 [phone]     (approximate)  I have reviewed the triage vital signs and the nursing notes.   HISTORY  Chief Complaint Urinary Tract Infection   HPI Ryan Kirby is a 78 y.o. male with below list of chronic medical conditions including recent ED evaluation secondary to urinary retention on 10/29/2018 at which time a Foley catheter was placed returns to the emergency department secondary to burning with urination x2 days.  Patient denies any fever afebrile on presentation.   Past Medical History:  Diagnosis Date  . Allergy   . CAD (coronary artery disease)   . Chronic kidney disease    pt is not on dialysis  . Diabetes mellitus without complication ([place])   . GERD (gastroesophageal reflux disease)   . Hyperlipidemia     Patient Active Problem List   Diagnosis Date Noted  . Hip pain 09/22/2018  . CKD stage 3 secondary to diabetes ([place]) 07/02/2018  . Advanced care planning/counseling discussion 12/24/2017  . GERD (gastroesophageal reflux disease) 12/06/2016  . BPH (benign prostatic hyperplasia) 12/06/2016  . DM type 2 causing CKD stage 3 ([place]) 06/02/2015  . Hyperlipidemia   . Parenchymal renal hypertension   . CAD (coronary artery disease)   . Heart murmur 03/04/2014  . 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  . Intermittent alternating exotropia 12/17/2012  . Preglaucoma 12/17/2012    Past Surgical History:  Procedure Laterality Date  . BOWEL RESECTION  2004  . CORONARY ARTERY BYPASS GRAFT    . FINGER AMPUTATION Left 2011   5th  . KNEE ARTHROSCOPY Right 09/12/2015   Procedure: ARTHROSCOPY KNEE, LATERAL MENISECTOMY, CHONDROPLASTY;  Surgeon: Dereck Leep, MD;  Location: [facility] ORS;  Service: Orthopedics;  Laterality: Right;    Prior to Admission medications   Medication Sig Start Date End Date Taking? Authorizing Provider  amLODipine (NORVASC) 10 MG  tablet TAKE 1 TABLET EVERY DAY 12/17/17   Guadalupe Maple, MD  benazepril-hydrochlorthiazide (LOTENSIN HCT) 20-12.5 MG tablet Take 1 tablet by mouth daily. 12/24/17   Guadalupe Maple, MD  calcitRIOL (ROCALTROL) 0.25 MCG capsule 3 (three) times a week. 02/06/16   [provider]  carvedilol (COREG) 25 MG tablet TAKE 2 TABLETS TWICE DAILY 12/17/17   Guadalupe Maple, MD  cephALEXin (KEFLEX) 500 MG capsule Take 1 capsule (500 mg total) by mouth 2 (two) times daily for 7 days. 11/04/18 11/11/18  Gregor Hams, MD  Cetirizine HCl 10 MG CAPS Take 1 capsule by mouth every morning.    [provider]  cloNIDine (CATAPRES) 0.3 MG tablet TAKE 1 TABLET TWICE DAILY 12/17/17   Guadalupe Maple, MD  docusate sodium (COLACE) 100 MG capsule Take 1 capsule (100 mg total) by mouth 2 (two) times daily. 10/29/18 10/29/19  Earleen Newport, MD  doxazosin (CARDURA) 2 MG tablet TAKE 1 TABLET EVERY MORNING 12/17/17   Guadalupe Maple, MD  esomeprazole (NEXIUM) 20 MG capsule Take 20 mg by mouth daily. Reported on 09/12/2015 04/25/15   [provider]  ferrous sulfate 325 (65 FE) MG tablet Take by mouth.    [provider]  furosemide (LASIX) 40 MG tablet TAKE 1 TABLET EVERY DAY 12/17/17   Guadalupe Maple, MD  glipiZIDE (GLUCOTROL) 5 MG tablet TAKE 1 TABLET EVERY DAY BEFORE BREAKFAST 12/17/17   Guadalupe Maple, MD  Glucosamine Sulfate 1000 MG TABS Take 1 tablet  by mouth every morning.    [provider]  meloxicam (MOBIC) 15 MG tablet Take 1 tablet (15 mg total) by mouth daily. 09/23/17   Hyatt, Max T, DPM  metFORMIN (GLUCOPHAGE) 500 MG tablet TAKE 1 TABLET TWICE DAILY 12/17/17   Guadalupe Maple, MD  OMEPRAZOLE PO  06/28/17   [provider]  phenazopyridine (PYRIDIUM) 100 MG tablet Take 1 tablet (100 mg total) by mouth 3 (three) times daily as needed for pain. 11/04/18 11/04/19  Gregor Hams, MD  potassium chloride SA (K-DUR,KLOR-CON) 20 MEQ tablet TAKE 1 TABLET EVERY DAY  12/17/17   Guadalupe Maple, MD  pravastatin (PRAVACHOL) 40 MG tablet TAKE 1 TABLET AT BEDTIME 12/17/17   Guadalupe Maple, MD  senna (SENOKOT) 8.6 MG TABS tablet Take 2 tablets (17.2 mg total) by mouth at bedtime as needed for mild constipation. 10/29/18   Earleen Newport, MD    Allergies Oxycontin [oxycodone]  Family History  Problem Relation Age of Onset  . Arthritis Mother   . Hypertension Mother   . Breast cancer Mother   . Arthritis Father   . Hypertension Father   . Colon cancer Father     Social History Social History   Tobacco Use  . Smoking status: Never Smoker  . Smokeless tobacco: Never Used  Substance Use Topics  . Alcohol use: No    Alcohol/week: 0.0 standard drinks  . Drug use: No    Review of Systems Constitutional: No fever/chills Eyes: No visual changes. ENT: No sore throat. Cardiovascular: Denies chest pain. Respiratory: Denies shortness of breath. Gastrointestinal: No abdominal pain.  No nausea, no vomiting.  No diarrhea.  No constipation. Genitourinary: Positive for dysuria. Musculoskeletal: Negative for neck pain.  Negative for back pain. Integumentary: Negative for rash. Neurological: Negative for headaches, focal weakness or numbness.   ____________________________________________   PHYSICAL EXAM:  VITAL SIGNS: ED Triage Vitals  Enc Vitals Group     BP 11/03/18 2148 138/83     Pulse Rate 11/03/18 2148 87     Resp 11/03/18 2148 18     Temp 11/03/18 2148 98.2 F (36.8 C)     Temp Source 11/03/18 2148 Oral     SpO2 11/03/18 2148 98 %     Weight 11/03/18 2150 81.6 kg (180 lb)     Height 11/03/18 2150 1.803 m (5\' 11" )     Head Circumference --      Peak Flow --      Pain Score 11/03/18 2156 0     Pain Loc --      Pain Edu? --      Excl. in [place]? --     Constitutional: Alert and oriented. Well appearing and in no acute distress. Eyes: Conjunctivae are normal.  Mouth/Throat: Mucous membranes are moist.  Oropharynx  non-erythematous. Neck: No stridor.   Cardiovascular: Normal rate, regular rhythm. Good peripheral circulation. Grossly normal heart sounds. Respiratory: Normal respiratory effort.  No retractions. Lungs CTAB. Gastrointestinal: Soft and nontender. No distention.  Genitourinary: Indwelling Foley catheter Musculoskeletal: No lower extremity tenderness nor edema. No gross deformities of extremities. Neurologic:  Normal speech and language. No gross focal neurologic deficits are appreciated.  Skin:  Skin is warm, dry and intact. No rash noted.   ____________________________________________   LABS (all labs ordered are listed, but only abnormal results are displayed)  Labs Reviewed  URINALYSIS, COMPLETE (UACMP) WITH MICROSCOPIC - Abnormal; Notable for the following components:      Result Value  Color, Urine AMBER (*)    APPearance CLOUDY (*)    Hgb urine dipstick LARGE (*)    Protein, ur 100 (*)    Leukocytes,Ua MODERATE (*)    RBC / HPF >50 (*)    WBC, UA >50 (*)    Bacteria, UA MANY (*)    All other components within normal limits   ______________________________________  Procedures   ____________________________________________   INITIAL IMPRESSION / MDM / ASSESSMENT AND PLAN / ED COURSE  As part of my medical decision making, I reviewed the following data within the electronic MEDICAL RECORD NUMBER   78 year old male presenting with above-stated history and physical exam secondary to dysuria with indwelling Foley catheter.  Patient given Pyridium and Keflex in the emergency department his urinalysis consistent with a UTI.  Patient has an appointment with urology on Friday    ____________________________________________  FINAL CLINICAL IMPRESSION(S) / ED DIAGNOSES  Final diagnoses:  Acute cystitis without hematuria     MEDICATIONS GIVEN DURING THIS VISIT:  Medications  phenazopyridine (PYRIDIUM) tablet 200 mg (200 mg Oral Given 11/04/18 0220)  cephALEXin (KEFLEX)  capsule 500 mg (500 mg Oral Given 11/04/18 0220)     ED Discharge Orders         Ordered    phenazopyridine (PYRIDIUM) 100 MG tablet  3 times daily PRN     11/04/18 0218    cephALEXin (KEFLEX) 500 MG capsule  2 times daily     11/04/18 0218           Note:  This document was prepared using Dragon voice recognition software and may include unintentional dictation errors.   Gregor Hams, MD 11/04/18 2245

## 2018-11-05 ENCOUNTER — Ambulatory Visit
Admission: RE | Admit: 2018-11-05 | Discharge: 2018-11-05 | Disposition: A | Discharge status: Home / Self Care | Payer: Medicare HMO | Source: Ambulatory Visit | Attending: Physical Medicine and Rehabilitation | Admitting: Physical Medicine and Rehabilitation

## 2018-11-05 ENCOUNTER — Other Ambulatory Visit: Payer: Self-pay

## 2018-11-05 DIAGNOSIS — M5416 Radiculopathy, lumbar region: Secondary | ICD-10-CM | POA: Insufficient documentation

## 2018-11-05 DIAGNOSIS — M48061 Spinal stenosis, lumbar region without neurogenic claudication: Secondary | ICD-10-CM | POA: Diagnosis not present

## 2018-11-05 DIAGNOSIS — M5126 Other intervertebral disc displacement, lumbar region: Secondary | ICD-10-CM | POA: Diagnosis not present

## 2018-11-07 ENCOUNTER — Encounter: Payer: Self-pay | Admitting: Urology

## 2018-11-07 ENCOUNTER — Ambulatory Visit: Payer: Medicare HMO | Admitting: Urology

## 2018-11-07 VITALS — BP 99/64 | HR 80 | Ht 71.0 in | Wt 185.0 lb

## 2018-11-07 DIAGNOSIS — R338 Other retention of urine: Secondary | ICD-10-CM | POA: Diagnosis not present

## 2018-11-07 DIAGNOSIS — N401 Enlarged prostate with lower urinary tract symptoms: Secondary | ICD-10-CM | POA: Diagnosis not present

## 2018-11-07 DIAGNOSIS — M461 Sacroiliitis, not elsewhere classified: Secondary | ICD-10-CM | POA: Diagnosis not present

## 2018-11-07 DIAGNOSIS — M48062 Spinal stenosis, lumbar region with neurogenic claudication: Secondary | ICD-10-CM | POA: Diagnosis not present

## 2018-11-07 DIAGNOSIS — M62830 Muscle spasm of back: Secondary | ICD-10-CM | POA: Diagnosis not present

## 2018-11-07 DIAGNOSIS — M5136 Other intervertebral disc degeneration, lumbar region: Secondary | ICD-10-CM | POA: Diagnosis not present

## 2018-11-07 DIAGNOSIS — M5416 Radiculopathy, lumbar region: Secondary | ICD-10-CM | POA: Diagnosis not present

## 2018-11-07 DIAGNOSIS — M7062 Trochanteric bursitis, left hip: Secondary | ICD-10-CM | POA: Diagnosis not present

## 2018-11-07 NOTE — Progress Notes (Signed)
Catheter Removal  Patient is present today for a catheter removal.  19ml of water was drained from the balloon. A 16FR foley cath was removed from the bladder no complications were noted . Patient tolerated well.  Preformed by: Shawnie Dapper, CMA  Follow up/ Additional notes: patient instructed to return to clinic after lunch for PVR

## 2018-11-07 NOTE — Progress Notes (Signed)
11/07/2018 8:06 AM   Ryan Kirby [birth year] [id number]  Referring provider: Guadalupe Maple, MD [address] [place], [place] [address]  Chief Complaint  Patient presents with  . Urinary Retention    HPI: Ryan Kirby is a 78 year old male who presented to the ED on 10/29/2018 complaining of constipation and urinary retention.  He was taking tramadol which precipitated his constipation.  A Foley catheter was placed and after having an enema with subsequent bowel movement his catheter was apparently removed.  It was replaced at that visit for recurrent retention.  I do not see the volume of urine obtained on record review.  He returned to the ED on 3/3 complaining of catheter discomfort and burning and was started on Keflex.  Prior to this episode he denied bothersome lower urinary tract symptoms, prior history of urinary retention or prior urologic evaluation.  He is on a very low-dose of doxazosin 2 mg daily.  He had a PSA April 2019 which was 1.8.   PMH: Past Medical History:  Diagnosis Date  . Allergy   . CAD (coronary artery disease)   . Chronic kidney disease    pt is not on dialysis  . Diabetes mellitus without complication ([place])   . GERD (gastroesophageal reflux disease)   . Hyperlipidemia     Surgical History: Past Surgical History:  Procedure Laterality Date  . BOWEL RESECTION  2004  . CORONARY ARTERY BYPASS GRAFT    . FINGER AMPUTATION Left 2011   5th  . KNEE ARTHROSCOPY Right 09/12/2015   Procedure: ARTHROSCOPY KNEE, LATERAL MENISECTOMY, CHONDROPLASTY;  Surgeon: Dereck Leep, MD;  Location: [facility] ORS;  Service: Orthopedics;  Laterality: Right;    Home Medications:  Allergies as of 11/07/2018      Reactions   Oxycontin [oxycodone] Nausea And Vomiting      Medication List       Accurate as of November 07, 2018  8:06 AM. Always use your most recent med list.        amLODipine 10 MG tablet Commonly known as:  NORVASC TAKE 1 TABLET EVERY DAY     benazepril-hydrochlorthiazide 20-12.5 MG tablet Commonly known as:  LOTENSIN HCT Take 1 tablet by mouth daily.   calcitRIOL 0.25 MCG capsule Commonly known as:  ROCALTROL 3 (three) times a week.   carvedilol 25 MG tablet Commonly known as:  COREG TAKE 2 TABLETS TWICE DAILY   cephALEXin 500 MG capsule Commonly known as:  KEFLEX Take 1 capsule (500 mg total) by mouth 2 (two) times daily for 7 days.   Cetirizine HCl 10 MG Caps Take 1 capsule by mouth every morning.   cloNIDine 0.3 MG tablet Commonly known as:  CATAPRES TAKE 1 TABLET TWICE DAILY   diazepam 2 MG tablet Commonly known as:  VALIUM 1-2 30 minutes before MRI   docusate sodium 100 MG capsule Commonly known as:  Colace Take 1 capsule (100 mg total) by mouth 2 (two) times daily.   doxazosin 2 MG tablet Commonly known as:  CARDURA TAKE 1 TABLET EVERY MORNING   ferrous sulfate 325 (65 FE) MG tablet Take by mouth.   furosemide 40 MG tablet Commonly known as:  LASIX TAKE 1 TABLET EVERY DAY   glipiZIDE 5 MG tablet Commonly known as:  GLUCOTROL TAKE 1 TABLET EVERY DAY BEFORE BREAKFAST   Glucosamine Sulfate 1000 MG Tabs Take 1 tablet by mouth every morning.   meloxicam 15 MG tablet Commonly known as:  MOBIC Take 1  tablet (15 mg total) by mouth daily.   metFORMIN 500 MG tablet Commonly known as:  GLUCOPHAGE TAKE 1 TABLET TWICE DAILY   NexIUM 20 MG capsule Generic drug:  esomeprazole Take 20 mg by mouth daily. Reported on 09/12/2015   OMEPRAZOLE PO   phenazopyridine 100 MG tablet Commonly known as:  PYRIDIUM Take 1 tablet (100 mg total) by mouth 3 (three) times daily as needed for pain.   potassium chloride SA 20 MEQ tablet Commonly known as:  K-DUR,KLOR-CON TAKE 1 TABLET EVERY DAY   pravastatin 40 MG tablet Commonly known as:  PRAVACHOL TAKE 1 TABLET AT BEDTIME   senna 8.6 MG Tabs tablet Commonly known as:  SENOKOT Take 2 tablets (17.2 mg total) by mouth at bedtime as needed for mild  constipation.   traMADol 50 MG tablet Commonly known as:  ULTRAM Take by mouth.       Allergies:  Allergies  Allergen Reactions  . Oxycontin [Oxycodone] Nausea And Vomiting    Family History: Family History  Problem Relation Age of Onset  . Arthritis Mother   . Hypertension Mother   . Breast cancer Mother   . Arthritis Father   . Hypertension Father   . Colon cancer Father     Social History:  reports that he has never smoked. He has never used smokeless tobacco. He reports that he does not drink alcohol or use drugs.  ROS: UROLOGY Frequent Urination?: No Hard to postpone urination?: No Burning/pain with urination?: No Get up at night to urinate?: No Leakage of urine?: No Urine stream starts and stops?: No Trouble starting stream?: No Do you have to strain to urinate?: No Blood in urine?: No Urinary tract infection?: Yes Sexually transmitted disease?: No Injury to kidneys or bladder?: No Painful intercourse?: No Weak stream?: No Erection problems?: No Penile pain?: No  Gastrointestinal Nausea?: No Vomiting?: No Indigestion/heartburn?: Yes Diarrhea?: No Constipation?: No  Constitutional Fever: No Night sweats?: No Weight loss?: No Fatigue?: No  Skin Skin rash/lesions?: No Itching?: No  Eyes Blurred vision?: No Double vision?: No  Ears/Nose/Throat Sore throat?: No Sinus problems?: No  Hematologic/Lymphatic Swollen glands?: No Easy bruising?: No  Cardiovascular Leg swelling?: No Chest pain?: No  Respiratory Cough?: No Shortness of breath?: No  Endocrine Excessive thirst?: No  Musculoskeletal Back pain?: No Joint pain?: No  Neurological Headaches?: No Dizziness?: No  Psychologic Depression?: No Anxiety?: No  Physical Exam: BP 99/64 (BP Location: Left Arm, Patient Position: Sitting)   Pulse 80   Ht 5\' 11"  (1.803 m)   Wt 185 lb (83.9 kg)   BMI 25.80 kg/m   Constitutional:  Alert and oriented, No acute  distress. HEENT: [place] AT, moist mucus membranes.  Trachea midline, no masses. Cardiovascular: No clubbing, cyanosis, or edema. Respiratory: Normal respiratory effort, no increased work of breathing. GI: Abdomen is soft, nontender, nondistended, no abdominal masses GU: No CVA tenderness.  Penis circumcised without lesions, testes descended bilaterally without masses or tenderness, cord/epididymis palpably normal bilaterally.  Foley catheter draining clear urine.  Prostate 50 g, flat/smooth without nodules. Lymph: No cervical or inguinal lymphadenopathy. Skin: No rashes, bruises or suspicious lesions. Neurologic: Grossly intact, no focal deficits, moving all 4 extremities. Psychiatric: Normal mood and affect.   Assessment & Plan:   78 year old male with BPH and urinary retention precipitated by narcotic pain medication/constipation.  Foley catheter was removed today.  He has an appointment later this afternoon and will return here for a bladder scan for PVR.  If voiding  satisfactorily recommend a 6-week follow-up for symptom check, IPSS and repeat bladder scan.  Based on his age and low PSA would recommend discontinuing annual prostate cancer screening.   Abbie Sons, [place] [address], [place], [place] [address] [phone]

## 2018-11-13 ENCOUNTER — Ambulatory Visit: Payer: Medicare HMO | Admitting: Urology

## 2018-11-13 ENCOUNTER — Other Ambulatory Visit: Payer: Self-pay

## 2018-11-13 ENCOUNTER — Encounter: Payer: Self-pay | Admitting: Urology

## 2018-11-13 VITALS — BP 149/73 | HR 86 | Ht 71.0 in | Wt 187.4 lb

## 2018-11-13 DIAGNOSIS — R300 Dysuria: Secondary | ICD-10-CM | POA: Diagnosis not present

## 2018-11-13 DIAGNOSIS — R339 Retention of urine, unspecified: Secondary | ICD-10-CM

## 2018-11-13 LAB — URINALYSIS, COMPLETE
Bilirubin, UA: NEGATIVE
KETONES UA: NEGATIVE
Nitrite, UA: NEGATIVE
SPEC GRAV UA: 1.02 (ref 1.005–1.030)
Urobilinogen, Ur: 0.2 mg/dL (ref 0.2–1.0)
pH, UA: 7 (ref 5.0–7.5)

## 2018-11-13 LAB — MICROSCOPIC EXAMINATION
Bacteria, UA: NONE SEEN
Epithelial Cells (non renal): NONE SEEN /hpf (ref 0–10)
WBC, UA: >30 /hpf — AB (ref 0–5)

## 2018-11-13 LAB — BLADDER SCAN AMB NON-IMAGING

## 2018-11-13 MED ORDER — SULFAMETHOXAZOLE-TRIMETHOPRIM 800-160 MG PO TABS: 1.0000 | ORAL_TABLET | Freq: Two times a day (BID) | ORAL | 0 refills | Status: DC

## 2018-11-13 NOTE — Progress Notes (Signed)
11/13/2018 3:26 PM   Ryan Kirby [birth year] [id number]  Referring provider: Guadalupe Maple, MD [address] [place], [place] [address]  Chief Complaint  Patient presents with  . Urinary Retention    burning w/ urination diff urinating     HPI: Ryan Kirby is a 78 y.o. male Caucasian with BPH and a history of urinary retention who presents today complaining of difficulty urinating and dysuria.  The patient presented to the emergency department on 10/29/2018 complaining of constipation and urinary retention.  He had been taking tramadol, which precipitated his constipation.  A Foley catheter was placed, and after having an enema with subsequent bowel movement, his catheter was apparently removed.  It was replaced at that visit for recurrent rentention.  On 11/04/2018, the patient returned to the emergency department complaining of catheter discomfort and burning.  He was started on Keflex.  When the patient was seen by Dr. Bernardo Heater on 11/07/2018, he denied prior bothersome lower urinary tract symptoms, prior history of urinary retention, or prior urological evaluation.  He was on a very low-dose of doxazosin 2 mg daily.  His Foley catheter was removed, and he was supposed to return for a PVR later that day.  He did not return, but he is voiding fine.  His PVR is 55 mL.    Today (11/13/2018), patient says he has been experiencing burning when urinates.  He denies fevers, chills, nausea, and vomiting.  Patient denies any gross hematuria or suprapubic/flank pain.  Patient denies any fevers, chills, nausea or vomiting. Patient admits back pain but does not believe it is connected to the current urinary issue.  He denies being constipated currently.  He denies urinary symptoms except for dysuria.  PMH: Past Medical History:  Diagnosis Date  . Allergy   . CAD (coronary artery disease)   . Chronic kidney disease    pt is not on dialysis  . Diabetes mellitus without complication ([place])    . GERD (gastroesophageal reflux disease)   . Hyperlipidemia     Surgical History: Past Surgical History:  Procedure Laterality Date  . BOWEL RESECTION  2004  . CORONARY ARTERY BYPASS GRAFT    . FINGER AMPUTATION Left 2011   5th  . KNEE ARTHROSCOPY Right 09/12/2015   Procedure: ARTHROSCOPY KNEE, LATERAL MENISECTOMY, CHONDROPLASTY;  Surgeon: Dereck Leep, MD;  Location: [facility] ORS;  Service: Orthopedics;  Laterality: Right;    Home Medications:  Allergies as of 11/13/2018      Reactions   Oxycontin [oxycodone] Nausea And Vomiting      Medication List       Accurate as of November 13, 2018  3:26 PM. Always use your most recent med list.        amLODipine 10 MG tablet Commonly known as:  NORVASC TAKE 1 TABLET EVERY DAY   benazepril-hydrochlorthiazide 20-12.5 MG tablet Commonly known as:  LOTENSIN HCT Take 1 tablet by mouth daily.   calcitRIOL 0.25 MCG capsule Commonly known as:  ROCALTROL 3 (three) times a week.   carvedilol 25 MG tablet Commonly known as:  COREG TAKE 2 TABLETS TWICE DAILY   Cetirizine HCl 10 MG Caps Take 1 capsule by mouth every morning.   cloNIDine 0.3 MG tablet Commonly known as:  CATAPRES TAKE 1 TABLET TWICE DAILY   diazepam 2 MG tablet Commonly known as:  VALIUM 1-2 30 minutes before MRI   docusate sodium 100 MG capsule Commonly known as:  Colace Take 1 capsule (100 mg  total) by mouth 2 (two) times daily.   doxazosin 2 MG tablet Commonly known as:  CARDURA TAKE 1 TABLET EVERY MORNING   ferrous sulfate 325 (65 FE) MG tablet Take by mouth.   furosemide 40 MG tablet Commonly known as:  LASIX TAKE 1 TABLET EVERY DAY   glipiZIDE 5 MG tablet Commonly known as:  GLUCOTROL TAKE 1 TABLET EVERY DAY BEFORE BREAKFAST   Glucosamine Sulfate 1000 MG Tabs Take 1 tablet by mouth every morning.   meloxicam 15 MG tablet Commonly known as:  MOBIC Take 1 tablet (15 mg total) by mouth daily.   metFORMIN 500 MG tablet Commonly known as:   GLUCOPHAGE TAKE 1 TABLET TWICE DAILY   NexIUM 20 MG capsule Generic drug:  esomeprazole Take 20 mg by mouth daily. Reported on 09/12/2015   OMEPRAZOLE PO   phenazopyridine 100 MG tablet Commonly known as:  PYRIDIUM Take 1 tablet (100 mg total) by mouth 3 (three) times daily as needed for pain.   potassium chloride SA 20 MEQ tablet Commonly known as:  K-DUR,KLOR-CON TAKE 1 TABLET EVERY DAY   pravastatin 40 MG tablet Commonly known as:  PRAVACHOL TAKE 1 TABLET AT BEDTIME   senna 8.6 MG Tabs tablet Commonly known as:  SENOKOT Take 2 tablets (17.2 mg total) by mouth at bedtime as needed for mild constipation.   sulfamethoxazole-trimethoprim 800-160 MG tablet Commonly known as:  BACTRIM DS,SEPTRA DS Take 1 tablet by mouth 2 (two) times daily.   traMADol 50 MG tablet Commonly known as:  ULTRAM Take by mouth.       Allergies:  Allergies  Allergen Reactions  . Oxycontin [Oxycodone] Nausea And Vomiting    Family History: Family History  Problem Relation Age of Onset  . Arthritis Mother   . Hypertension Mother   . Breast cancer Mother   . Arthritis Father   . Hypertension Father   . Colon cancer Father     Social History:  reports that he has never smoked. He has never used smokeless tobacco. He reports that he does not drink alcohol or use drugs.  ROS: UROLOGY Frequent Urination?: No Hard to postpone urination?: No Burning/pain with urination?: Yes Get up at night to urinate?: No Leakage of urine?: No Urine stream starts and stops?: No Trouble starting stream?: No Do you have to strain to urinate?: No Blood in urine?: No Urinary tract infection?: No Sexually transmitted disease?: No Injury to kidneys or bladder?: No Painful intercourse?: No Weak stream?: No Erection problems?: No Penile pain?: No  Gastrointestinal Nausea?: No Vomiting?: No Indigestion/heartburn?: No Diarrhea?: No Constipation?: No  Constitutional Fever: No Night sweats?: No  Weight loss?: No Fatigue?: No  Skin Skin rash/lesions?: No Itching?: No  Eyes Blurred vision?: No Double vision?: No  Ears/Nose/Throat Sore throat?: No Sinus problems?: No  Hematologic/Lymphatic Swollen glands?: No Easy bruising?: No  Cardiovascular Leg swelling?: No Chest pain?: No  Respiratory Cough?: No Shortness of breath?: No  Endocrine Excessive thirst?: No  Musculoskeletal Back pain?: No Joint pain?: No  Neurological Headaches?: No Dizziness?: No  Psychologic Depression?: No Anxiety?: No  Physical Exam: BP (!) 149/73   Pulse 86   Ht 5\' 11"  (1.803 m)   Wt 187 lb 6.4 oz (85 kg)   SpO2 96%   BMI 26.14 kg/m   Constitutional:  Well nourished. Alert and oriented, No acute distress. Cardiovascular: No clubbing, cyanosis, or edema. Respiratory: Normal respiratory effort, no increased work of breathing. Skin: No rashes, bruises or suspicious lesions.  Neurologic: Grossly intact, no focal deficits, moving all 4 extremities. Psychiatric: Normal mood and affect.  Laboratory Data: Lab Results  Component Value Date   WBC 5.7 12/24/2017   HGB 13.2 12/24/2017   HCT 40.5 12/24/2017   MCV 92 12/24/2017   PLT 190 12/24/2017    Lab Results  Component Value Date   CREATININE 1.17 07/02/2018   Lab Results  Component Value Date   HGBA1C 6.7 07/02/2018   Urinalysis Cloudy, >30 WBCs, 3-10 RBCs, GLU 2+, BLO 1+, PRO trace, LEU trace.  See Epic.  I have reviewed the labs.  Pertinent Imaging:  Results for orders placed or performed in visit on 11/13/18  Bladder Scan (Post Void Residual) in office  Result Value Ref Range   Scan Result 42ml    I have independently reviewed the films.  Assessment & Plan:    1. Possible urinary tract infection/dysuria - Patient is 2 weeks status post Foley catheterization for urinary retention - Urine will be sent for culture - Will provide antibiotic; may change pending on culture results - Patient is advised that  if they should start to experience pain that is not able to be controlled with pain medication, intractable nausea and/or vomiting and/or fevers greater than 103 or shaking chills to contact the office immediately or seek treatment in the emergency department for emergent intervention.    2. History of retention - voiding well    Return for pending urine culture results .  Zara Council, PA-C  [facility] [address], [place], [place] [address] [phone]  I, Adele Schilder, am acting as a scribe for [facility], PA-C   I have reviewed the above documentation for accuracy and completeness, and I agree with the above.    Zara Council, PA-C

## 2018-11-14 ENCOUNTER — Ambulatory Visit: Payer: Medicare HMO

## 2018-11-17 DIAGNOSIS — M5416 Radiculopathy, lumbar region: Secondary | ICD-10-CM | POA: Diagnosis not present

## 2018-11-17 DIAGNOSIS — M5126 Other intervertebral disc displacement, lumbar region: Secondary | ICD-10-CM | POA: Diagnosis not present

## 2018-11-18 ENCOUNTER — Telehealth: Payer: Self-pay

## 2018-11-18 LAB — CULTURE, URINE COMPREHENSIVE

## 2018-11-18 NOTE — Telephone Encounter (Signed)
Patient notified on vmail per DPR 

## 2018-11-18 NOTE — Telephone Encounter (Signed)
-----   Message from Nori Riis, PA-C sent at 11/18/2018  2:14 PM EDT ----- Please let Ryan Kirby know that his urine culture was positive for infection.  The Septra that he was prescribed is the appropriate antibiotic.

## 2018-12-16 ENCOUNTER — Telehealth: Payer: Self-pay

## 2018-12-16 ENCOUNTER — Ambulatory Visit: Payer: Medicare HMO | Admitting: Urology

## 2018-12-16 NOTE — Telephone Encounter (Signed)
Patient scheduled for an AWV on 12/22/2018 with [facility], Due to Covid-19 pandemic this is unable to be done in office, called patient to see if they are able to do this virtually or if it needed to rescheduled for an in office for after June 2020. Patient rescheduled for 02/12/2019, patient requested appt reminder be mailed.

## 2018-12-22 ENCOUNTER — Ambulatory Visit: Payer: Self-pay

## 2018-12-25 DIAGNOSIS — M5126 Other intervertebral disc displacement, lumbar region: Secondary | ICD-10-CM | POA: Diagnosis not present

## 2018-12-25 DIAGNOSIS — M5416 Radiculopathy, lumbar region: Secondary | ICD-10-CM | POA: Diagnosis not present

## 2018-12-27 ENCOUNTER — Other Ambulatory Visit: Payer: Self-pay | Admitting: Family Medicine

## 2018-12-27 DIAGNOSIS — E7800 Pure hypercholesterolemia, unspecified: Secondary | ICD-10-CM

## 2018-12-27 DIAGNOSIS — E119 Type 2 diabetes mellitus without complications: Secondary | ICD-10-CM

## 2018-12-27 DIAGNOSIS — I10 Essential (primary) hypertension: Secondary | ICD-10-CM

## 2018-12-27 NOTE — Telephone Encounter (Signed)
Can pt have a refill on this med 

## 2019-01-19 DIAGNOSIS — M48062 Spinal stenosis, lumbar region with neurogenic claudication: Secondary | ICD-10-CM | POA: Diagnosis not present

## 2019-01-19 DIAGNOSIS — M5416 Radiculopathy, lumbar region: Secondary | ICD-10-CM | POA: Diagnosis not present

## 2019-01-19 DIAGNOSIS — M5136 Other intervertebral disc degeneration, lumbar region: Secondary | ICD-10-CM | POA: Diagnosis not present

## 2019-01-19 DIAGNOSIS — M62830 Muscle spasm of back: Secondary | ICD-10-CM | POA: Diagnosis not present

## 2019-01-19 DIAGNOSIS — M7062 Trochanteric bursitis, left hip: Secondary | ICD-10-CM | POA: Diagnosis not present

## 2019-01-19 DIAGNOSIS — M5126 Other intervertebral disc displacement, lumbar region: Secondary | ICD-10-CM | POA: Diagnosis not present

## 2019-01-19 DIAGNOSIS — M461 Sacroiliitis, not elsewhere classified: Secondary | ICD-10-CM | POA: Diagnosis not present

## 2019-01-20 DIAGNOSIS — M25552 Pain in left hip: Secondary | ICD-10-CM | POA: Diagnosis not present

## 2019-01-20 DIAGNOSIS — M545 Low back pain: Secondary | ICD-10-CM | POA: Diagnosis not present

## 2019-01-27 ENCOUNTER — Encounter: Payer: Self-pay | Admitting: Family Medicine

## 2019-01-27 ENCOUNTER — Other Ambulatory Visit: Payer: Self-pay

## 2019-01-27 ENCOUNTER — Ambulatory Visit (INDEPENDENT_AMBULATORY_CARE_PROVIDER_SITE_OTHER): Payer: Medicare HMO | Admitting: Family Medicine

## 2019-01-27 DIAGNOSIS — E119 Type 2 diabetes mellitus without complications: Secondary | ICD-10-CM

## 2019-01-27 DIAGNOSIS — I2510 Atherosclerotic heart disease of native coronary artery without angina pectoris: Secondary | ICD-10-CM | POA: Diagnosis not present

## 2019-01-27 DIAGNOSIS — N400 Enlarged prostate without lower urinary tract symptoms: Secondary | ICD-10-CM

## 2019-01-27 DIAGNOSIS — M545 Low back pain: Secondary | ICD-10-CM | POA: Diagnosis not present

## 2019-01-27 DIAGNOSIS — Z7189 Other specified counseling: Secondary | ICD-10-CM | POA: Diagnosis not present

## 2019-01-27 DIAGNOSIS — I10 Essential (primary) hypertension: Secondary | ICD-10-CM

## 2019-01-27 DIAGNOSIS — E7800 Pure hypercholesterolemia, unspecified: Secondary | ICD-10-CM

## 2019-01-27 DIAGNOSIS — N183 Chronic kidney disease, stage 3 unspecified: Secondary | ICD-10-CM

## 2019-01-27 DIAGNOSIS — I2583 Coronary atherosclerosis due to lipid rich plaque: Secondary | ICD-10-CM | POA: Diagnosis not present

## 2019-01-27 DIAGNOSIS — K219 Gastro-esophageal reflux disease without esophagitis: Secondary | ICD-10-CM

## 2019-01-27 DIAGNOSIS — M25552 Pain in left hip: Secondary | ICD-10-CM | POA: Diagnosis not present

## 2019-01-27 DIAGNOSIS — E1122 Type 2 diabetes mellitus with diabetic chronic kidney disease: Secondary | ICD-10-CM

## 2019-01-27 MED ORDER — METFORMIN HCL 500 MG PO TABS: 500.0000 mg | ORAL_TABLET | Freq: Two times a day (BID) | ORAL | 4 refills | Status: DC

## 2019-01-27 MED ORDER — CARVEDILOL 25 MG PO TABS: 50.0000 mg | ORAL_TABLET | Freq: Two times a day (BID) | ORAL | 4 refills | Status: DC

## 2019-01-27 MED ORDER — AMLODIPINE BESYLATE 10 MG PO TABS: 10.0000 mg | ORAL_TABLET | Freq: Every day | ORAL | 4 refills | Status: DC

## 2019-01-27 MED ORDER — POTASSIUM CHLORIDE CRYS ER 20 MEQ PO TBCR: 20.0000 meq | EXTENDED_RELEASE_TABLET | Freq: Every day | ORAL | 4 refills | Status: DC

## 2019-01-27 MED ORDER — PRAVASTATIN SODIUM 40 MG PO TABS: 40.0000 mg | ORAL_TABLET | Freq: Every day | ORAL | 4 refills | Status: DC

## 2019-01-27 MED ORDER — FUROSEMIDE 40 MG PO TABS: 40.0000 mg | ORAL_TABLET | Freq: Every day | ORAL | 4 refills | Status: DC

## 2019-01-27 MED ORDER — CLONIDINE HCL 0.3 MG PO TABS: 0.3000 mg | ORAL_TABLET | Freq: Two times a day (BID) | ORAL | 4 refills | Status: DC

## 2019-01-27 MED ORDER — DOXAZOSIN MESYLATE 2 MG PO TABS: 2.0000 mg | ORAL_TABLET | Freq: Every morning | ORAL | 4 refills | Status: DC

## 2019-01-27 MED ORDER — GLIPIZIDE 5 MG PO TABS: 5.0000 mg | ORAL_TABLET | Freq: Every day | ORAL | 4 refills | Status: DC

## 2019-01-27 MED ORDER — BENAZEPRIL-HYDROCHLOROTHIAZIDE 20-12.5 MG PO TABS: 1.0000 | ORAL_TABLET | Freq: Every day | ORAL | 4 refills | Status: DC

## 2019-01-27 NOTE — Assessment & Plan Note (Signed)
A voluntary discussion about advanced care planning including explanation and discussion of advanced directives was extentively discussed with the patient.  Explained about the healthcare proxy and living will was reviewed and packet with forms with expiration of how to fill them out was given.  Time spent: Encounter 16+ min individuals present: Patient 

## 2019-01-27 NOTE — Assessment & Plan Note (Signed)
The current medical regimen is effective;  continue present plan and medications.  

## 2019-01-27 NOTE — Progress Notes (Signed)
There were no vitals taken for this visit.   Subjective:    Patient ID: Ryan Kirby, male    DOB: [birth year], 78 y.o.   MRN: [id number]  HPI: Ryan Kirby is a 78 y.o. male  Med check  Telemedicine using audio/video telecommunications for a synchronous communication visit. Today's visit due to COVID-19 isolation precautions I connected with and verified that I am speaking with the correct person using two identifiers.   I discussed the limitations, risks, security and privacy concerns of performing an evaluation and management service by telecommunication and the availability of in person appointments. I also discussed with the patient that there may be a patient responsible charge related to this service. The patient expressed understanding and agreed to proceed. The patient's location is home. I am at home.  Discussed with patient doing well except for back.  Is going to physical therapy for his back had a UTI which is resolved and no further bladder issues.  Took some pain medicines for his back but that bother him is only taking Tylenol now and going to physical therapy which seems to be helping and his back is recovering. No chest pain chest tightness coronary artery disease symptoms. Reflux stable. Diabetes and CKD stable. Blood pressure seems to be doing well with no complaints from medications. No complaints from BPH or cholesterol.  Discussed advance care planning living will and healthcare power of attorney.  Patient indicates he does have a will and his family notes his plans discussed putting in writing.  Relevant past medical, surgical, family and social history reviewed and updated as indicated. Interim medical history since our last visit reviewed. Allergies and medications reviewed and updated.  Review of Systems  Constitutional: Negative.   HENT: Negative.   Eyes: Negative.   Respiratory: Negative.   Cardiovascular: Negative.   Gastrointestinal: Negative.    Endocrine: Negative.   Genitourinary: Negative.   Musculoskeletal: Negative.   Skin: Negative.   Allergic/Immunologic: Negative.   Neurological: Negative.   Hematological: Negative.   Psychiatric/Behavioral: Negative.     Per HPI unless specifically indicated above     Objective:    There were no vitals taken for this visit.  Wt Readings from Last 3 Encounters:  11/13/18 187 lb 6.4 oz (85 kg)  11/07/18 185 lb (83.9 kg)  11/03/18 180 lb (81.6 kg)    Physical Exam  Results for orders placed or performed in visit on 11/13/18  CULTURE, URINE COMPREHENSIVE  Result Value Ref Range   Urine Culture, Comprehensive Final report (A)    Organism ID, Bacteria Serratia marcescens (A)    ANTIMICROBIAL SUSCEPTIBILITY Comment   Microscopic Examination  Result Value Ref Range   WBC, UA >30 (A) 0 - 5 /hpf   RBC, UA 3-10 (A) 0 - 2 /hpf   Epithelial Cells (non renal) None seen 0 - 10 /hpf   Bacteria, UA None seen None seen/Few  Urinalysis, Complete  Result Value Ref Range   Specific Gravity, UA 1.020 1.005 - 1.030   pH, UA 7.0 5.0 - 7.5   Color, UA Yellow Yellow   Appearance Ur Cloudy (A) Clear   Leukocytes, UA Trace (A) Negative   Protein, UA Trace (A) Negative/Trace   Glucose, UA 2+ (A) Negative   Ketones, UA Negative Negative   RBC, UA 1+ (A) Negative   Bilirubin, UA Negative Negative   Urobilinogen, Ur 0.2 0.2 - 1.0 mg/dL   Nitrite, UA Negative Negative   Microscopic Examination See  below:   Bladder Scan (Post Void Residual) in office  Result Value Ref Range   Scan Result 20ml       Assessment & Plan:   Problem List Items Addressed This Visit      Cardiovascular and Mediastinum   CAD (coronary artery disease)    The current medical regimen is effective;  continue present plan and medications.       Relevant Medications   cloNIDine (CATAPRES) 0.3 MG tablet   pravastatin (PRAVACHOL) 40 MG tablet   potassium chloride SA (K-DUR) 20 MEQ tablet   furosemide (LASIX) 40  MG tablet   doxazosin (CARDURA) 2 MG tablet   carvedilol (COREG) 25 MG tablet   benazepril-hydrochlorthiazide (LOTENSIN HCT) 20-12.5 MG tablet   amLODipine (NORVASC) 10 MG tablet   Other Relevant Orders   Comprehensive metabolic panel   Lipid panel   CBC with Differential/Platelet   TSH     Digestive   GERD (gastroesophageal reflux disease)    The current medical regimen is effective;  continue present plan and medications.         Endocrine   CKD stage 3 secondary to diabetes [facility])    The current medical regimen is effective;  continue present plan and medications.       Relevant Medications   pravastatin (PRAVACHOL) 40 MG tablet   metFORMIN (GLUCOPHAGE) 500 MG tablet   glipiZIDE (GLUCOTROL) 5 MG tablet   benazepril-hydrochlorthiazide (LOTENSIN HCT) 20-12.5 MG tablet   Other Relevant Orders   Bayer DCA Hb A1c Waived   Comprehensive metabolic panel   TSH     Genitourinary   BPH (benign prostatic hyperplasia)    The current medical regimen is effective;  continue present plan and medications.       Relevant Orders   PSA     Other   Hyperlipidemia    The current medical regimen is effective;  continue present plan and medications.       Relevant Medications   cloNIDine (CATAPRES) 0.3 MG tablet   pravastatin (PRAVACHOL) 40 MG tablet   furosemide (LASIX) 40 MG tablet   doxazosin (CARDURA) 2 MG tablet   carvedilol (COREG) 25 MG tablet   benazepril-hydrochlorthiazide (LOTENSIN HCT) 20-12.5 MG tablet   amLODipine (NORVASC) 10 MG tablet   Advanced care planning/counseling discussion    A voluntary discussion about advanced care planning including explanation and discussion of advanced directives was extentively discussed with the patient.  Explained about the healthcare proxy and living will was reviewed and packet with forms with expiration of how to fill them out was given.  Time spent: Encounter 16+ min individuals present: Patient       Other Visit Diagnoses     Essential hypertension       Relevant Medications   cloNIDine (CATAPRES) 0.3 MG tablet   pravastatin (PRAVACHOL) 40 MG tablet   furosemide (LASIX) 40 MG tablet   doxazosin (CARDURA) 2 MG tablet   carvedilol (COREG) 25 MG tablet   benazepril-hydrochlorthiazide (LOTENSIN HCT) 20-12.5 MG tablet   amLODipine (NORVASC) 10 MG tablet   Other Relevant Orders   CBC with Differential/Platelet   TSH   Urinalysis, Routine w reflex microscopic   Diabetes mellitus without complication (HCC)       Relevant Medications   pravastatin (PRAVACHOL) 40 MG tablet   metFORMIN (GLUCOPHAGE) 500 MG tablet   glipiZIDE (GLUCOTROL) 5 MG tablet   benazepril-hydrochlorthiazide (LOTENSIN HCT) 20-12.5 MG tablet      I discussed the assessment and  treatment plan with the patient. The patient was provided an opportunity to ask questions and all were answered. The patient agreed with the plan and demonstrated an understanding of the instructions.   The patient was advised to call back or seek an in-person evaluation if the symptoms worsen or if the condition fails to improve as anticipated.   I provided 21+ minutes of time during this encounter. Follow up plan: Return in about 2 weeks (around 02/10/2019) for Physical Exam and labs, 6 mo f/u with BMP, A1C.

## 2019-01-29 DIAGNOSIS — M545 Low back pain: Secondary | ICD-10-CM | POA: Diagnosis not present

## 2019-01-29 DIAGNOSIS — M25552 Pain in left hip: Secondary | ICD-10-CM | POA: Diagnosis not present

## 2019-02-03 DIAGNOSIS — M545 Low back pain: Secondary | ICD-10-CM | POA: Diagnosis not present

## 2019-02-03 DIAGNOSIS — M25552 Pain in left hip: Secondary | ICD-10-CM | POA: Diagnosis not present

## 2019-02-05 DIAGNOSIS — M545 Low back pain: Secondary | ICD-10-CM | POA: Diagnosis not present

## 2019-02-05 DIAGNOSIS — M25552 Pain in left hip: Secondary | ICD-10-CM | POA: Diagnosis not present

## 2019-02-10 DIAGNOSIS — M25552 Pain in left hip: Secondary | ICD-10-CM | POA: Diagnosis not present

## 2019-02-10 DIAGNOSIS — M545 Low back pain: Secondary | ICD-10-CM | POA: Diagnosis not present

## 2019-02-11 ENCOUNTER — Ambulatory Visit (INDEPENDENT_AMBULATORY_CARE_PROVIDER_SITE_OTHER): Payer: Medicare HMO | Admitting: Family Medicine

## 2019-02-11 ENCOUNTER — Other Ambulatory Visit: Payer: Self-pay

## 2019-02-11 ENCOUNTER — Encounter: Payer: Self-pay | Admitting: Family Medicine

## 2019-02-11 ENCOUNTER — Ambulatory Visit (INDEPENDENT_AMBULATORY_CARE_PROVIDER_SITE_OTHER): Payer: Medicare HMO

## 2019-02-11 VITALS — BP 129/68 | HR 73 | Temp 98.8°F | Ht 69.5 in | Wt 182.0 lb

## 2019-02-11 VITALS — BP 129/68 | HR 73 | Temp 98.8°F | Resp 16 | Ht 69.5 in | Wt 182.0 lb

## 2019-02-11 DIAGNOSIS — I2583 Coronary atherosclerosis due to lipid rich plaque: Secondary | ICD-10-CM | POA: Diagnosis not present

## 2019-02-11 DIAGNOSIS — E1122 Type 2 diabetes mellitus with diabetic chronic kidney disease: Secondary | ICD-10-CM | POA: Diagnosis not present

## 2019-02-11 DIAGNOSIS — E782 Mixed hyperlipidemia: Secondary | ICD-10-CM

## 2019-02-11 DIAGNOSIS — N183 Chronic kidney disease, stage 3 unspecified: Secondary | ICD-10-CM

## 2019-02-11 DIAGNOSIS — I129 Hypertensive chronic kidney disease with stage 1 through stage 4 chronic kidney disease, or unspecified chronic kidney disease: Secondary | ICD-10-CM

## 2019-02-11 DIAGNOSIS — L570 Actinic keratosis: Secondary | ICD-10-CM | POA: Diagnosis not present

## 2019-02-11 DIAGNOSIS — K219 Gastro-esophageal reflux disease without esophagitis: Secondary | ICD-10-CM | POA: Diagnosis not present

## 2019-02-11 DIAGNOSIS — I2510 Atherosclerotic heart disease of native coronary artery without angina pectoris: Secondary | ICD-10-CM | POA: Diagnosis not present

## 2019-02-11 DIAGNOSIS — Z0000 Encounter for general adult medical examination without abnormal findings: Secondary | ICD-10-CM

## 2019-02-11 LAB — UA/M W/RFLX CULTURE, ROUTINE
Bilirubin, UA: NEGATIVE
Ketones, UA: NEGATIVE
Leukocytes,UA: NEGATIVE
Nitrite, UA: NEGATIVE
Protein,UA: NEGATIVE
RBC, UA: NEGATIVE
Specific Gravity, UA: 1.01 (ref 1.005–1.030)
Urobilinogen, Ur: 0.2 mg/dL (ref 0.2–1.0)
pH, UA: 5.5 (ref 5.0–7.5)

## 2019-02-11 NOTE — Patient Instructions (Signed)
Ryan Kirby , Thank you for taking time to come for your Medicare Wellness Visit. I appreciate your ongoing commitment to your health goals. Please review the following plan we discussed and let me know if I can assist you in the future.   Screening recommendations/referrals: Colonoscopy: declined  Recommended yearly ophthalmology/optometry visit for glaucoma screening and checkup Recommended yearly dental visit for hygiene and checkup  Vaccinations: Influenza vaccine: up to date Pneumococcal vaccine: up to date Tdap vaccine: up to date Shingles vaccine: shingrix eligible, check with your insurance company for coverage     Advanced directives: Advance directive discussed with you today. I have provided a copy for you to complete at home and have notarized. Once this is complete please bring a copy in to our office so we can scan it into your chart.  Conditions/risks identified: diabetic, please schedule eye exam.   Next appointment: Follow up in one year for your annual wellness exam.   Preventive Care 65 Years and Older, Male Preventive care refers to lifestyle choices and visits with your health care provider that can promote health and wellness. What does preventive care include?  A yearly physical exam. This is also called an annual well check.  Dental exams once or twice a year.  Routine eye exams. Ask your health care provider how often you should have your eyes checked.  Personal lifestyle choices, including:  Daily care of your teeth and gums.  Regular physical activity.  Eating a healthy diet.  Avoiding tobacco and drug use.  Limiting alcohol use.  Practicing safe sex.  Taking low doses of aspirin every day.  Taking vitamin and mineral supplements as recommended by your health care provider. What happens during an annual well check? The services and screenings done by your health care provider during your annual well check will depend on your age, overall  health, lifestyle risk factors, and family history of disease. Counseling  Your health care provider may ask you questions about your:  Alcohol use.  Tobacco use.  Drug use.  Emotional well-being.  Home and relationship well-being.  Sexual activity.  Eating habits.  History of falls.  Memory and ability to understand (cognition).  Work and work Statistician. Screening  You may have the following tests or measurements:  Height, weight, and BMI.  Blood pressure.  Lipid and cholesterol levels. These may be checked every 5 years, or more frequently if you are over 12 years old.  Skin check.  Lung cancer screening. You may have this screening every year starting at age 7 if you have a 30-pack-year history of smoking and currently smoke or have quit within the past 15 years.  Fecal occult blood test (FOBT) of the stool. You may have this test every year starting at age 71.  Flexible sigmoidoscopy or colonoscopy. You may have a sigmoidoscopy every 5 years or a colonoscopy every 10 years starting at age 40.  Prostate cancer screening. Recommendations will vary depending on your family history and other risks.  Hepatitis C blood test.  Hepatitis B blood test.  Sexually transmitted disease (STD) testing.  Diabetes screening. This is done by checking your blood sugar (glucose) after you have not eaten for a while (fasting). You may have this done every 1-3 years.  Abdominal aortic aneurysm (AAA) screening. You may need this if you are a current or former smoker.  Osteoporosis. You may be screened starting at age 43 if you are at high risk. Talk with your health care provider  about your test results, treatment options, and if necessary, the need for more tests. Vaccines  Your health care provider may recommend certain vaccines, such as:  Influenza vaccine. This is recommended every year.  Tetanus, diphtheria, and acellular pertussis (Tdap, Td) vaccine. You may need a Td  booster every 10 years.  Zoster vaccine. You may need this after age 88.  Pneumococcal 13-valent conjugate (PCV13) vaccine. One dose is recommended after age 70.  Pneumococcal polysaccharide (PPSV23) vaccine. One dose is recommended after age 38. Talk to your health care provider about which screenings and vaccines you need and how often you need them. This information is not intended to replace advice given to you by your health care provider. Make sure you discuss any questions you have with your health care provider. Document Released: 09/16/2015 Document Revised: 05/09/2016 Document Reviewed: 06/21/2015 Elsevier Interactive Patient Education  2017 [place] Prevention in the Home Falls can cause injuries. They can happen to people of all ages. There are many things you can do to make your home safe and to help prevent falls. What can I do on the outside of my home?  Regularly fix the edges of walkways and driveways and fix any cracks.  Remove anything that might make you trip as you walk through a door, such as a raised step or threshold.  Trim any bushes or trees on the path to your home.  Use bright outdoor lighting.  Clear any walking paths of anything that might make someone trip, such as rocks or tools.  Regularly check to see if handrails are loose or broken. Make sure that both sides of any steps have handrails.  Any raised decks and porches should have guardrails on the edges.  Have any leaves, snow, or ice cleared regularly.  Use sand or salt on walking paths during winter.  Clean up any spills in your garage right away. This includes oil or grease spills. What can I do in the bathroom?  Use night lights.  Install grab bars by the toilet and in the tub and shower. Do not use towel bars as grab bars.  Use non-skid mats or decals in the tub or shower.  If you need to sit down in the shower, use a plastic, non-slip stool.  Keep the floor dry. Clean up  any water that spills on the floor as soon as it happens.  Remove soap buildup in the tub or shower regularly.  Attach bath mats securely with double-sided non-slip rug tape.  Do not have throw rugs and other things on the floor that can make you trip. What can I do in the bedroom?  Use night lights.  Make sure that you have a light by your bed that is easy to reach.  Do not use any sheets or blankets that are too big for your bed. They should not hang down onto the floor.  Have a firm chair that has side arms. You can use this for support while you get dressed.  Do not have throw rugs and other things on the floor that can make you trip. What can I do in the kitchen?  Clean up any spills right away.  Avoid walking on wet floors.  Keep items that you use a lot in easy-to-reach places.  If you need to reach something above you, use a strong step stool that has a grab bar.  Keep electrical cords out of the way.  Do not use floor polish or  wax that makes floors slippery. If you must use wax, use non-skid floor wax.  Do not have throw rugs and other things on the floor that can make you trip. What can I do with my stairs?  Do not leave any items on the stairs.  Make sure that there are handrails on both sides of the stairs and use them. Fix handrails that are broken or loose. Make sure that handrails are as long as the stairways.  Check any carpeting to make sure that it is firmly attached to the stairs. Fix any carpet that is loose or worn.  Avoid having throw rugs at the top or bottom of the stairs. If you do have throw rugs, attach them to the floor with carpet tape.  Make sure that you have a light switch at the top of the stairs and the bottom of the stairs. If you do not have them, ask someone to add them for you. What else can I do to help prevent falls?  Wear shoes that:  Do not have high heels.  Have rubber bottoms.  Are comfortable and fit you well.  Are  closed at the toe. Do not wear sandals.  If you use a stepladder:  Make sure that it is fully opened. Do not climb a closed stepladder.  Make sure that both sides of the stepladder are locked into place.  Ask someone to hold it for you, if possible.  Clearly mark and make sure that you can see:  Any grab bars or handrails.  First and last steps.  Where the edge of each step is.  Use tools that help you move around (mobility aids) if they are needed. These include:  Canes.  Walkers.  Scooters.  Crutches.  Turn on the lights when you go into a dark area. Replace any light bulbs as soon as they burn out.  Set up your furniture so you have a clear path. Avoid moving your furniture around.  If any of your floors are uneven, fix them.  If there are any pets around you, be aware of where they are.  Review your medicines with your doctor. Some medicines can make you feel dizzy. This can increase your chance of falling. Ask your doctor what other things that you can do to help prevent falls. This information is not intended to replace advice given to you by your health care provider. Make sure you discuss any questions you have with your health care provider. Document Released: 06/16/2009 Document Revised: 01/26/2016 Document Reviewed: 09/24/2014 Elsevier Interactive Patient Education  2017 [place] provider would like to you have your annual eye exam. Please contact your current eye doctor or here are some good options for you to contact.

## 2019-02-11 NOTE — Progress Notes (Signed)
BP 129/68   Pulse 73   Temp 98.8 F (37.1 C) (Oral)   Ht 5' 9.5" (1.765 m)   Wt 182 lb (82.6 kg)   PF 98 L/min   BMI 26.49 kg/m    Subjective:    Patient ID: Ryan Kirby, male    DOB: March 11, 1941, 78 y.o.   MRN: [id number]  HPI: Ryan Kirby is a 78 y.o. male presenting on 02/11/2019 for comprehensive medical examination. Current medical complaints include:see below  No new concerns today, feeling well overall. Taking medications faithfully without side effects.   Taking pravastatin for HLD, CAD. Denies CP, SOB, claudication, myalgias. Stays very active, tries to watch what he eats.   HTN - taking amlodipine, clonidine, and benazepril-HCTZ. Rarely checks home BPs, does not recall readings when he has.   Taking glipizide and metformin for DM, no low blood sugar spells.   He currently lives with: Interim Problems from his last visit: no  Depression Screen done today and results listed below:  Depression screen [facility] 2/9 02/11/2019 12/16/2017 09/23/2017 06/24/2017 03/13/2017  Decreased Interest 0 1 0 0 0  Down, Depressed, Hopeless 1 1 0 0 0  PHQ - 2 Score 1 2 0 0 0  Altered sleeping 1 2 - - -  Tired, decreased energy 1 2 - - -  Change in appetite 0 0 - - -  Feeling bad or failure about yourself  0 0 - - -  Trouble concentrating 0 0 - - -  Moving slowly or fidgety/restless 0 0 - - -  Suicidal thoughts 0 0 - - -  PHQ-9 Score 3 6 - - -  Difficult doing work/chores Not difficult at all Not difficult at all - - -    The patient does not have a history of falls. I did complete a risk assessment for falls. A plan of care for falls was documented.   Past Medical History:  Past Medical History:  Diagnosis Date  . Allergy   . CAD (coronary artery disease)   . Chronic kidney disease    pt is not on dialysis  . Diabetes mellitus without complication ([place])   . GERD (gastroesophageal reflux disease)   . Hyperlipidemia     Surgical History:  Past Surgical History:  Procedure  Laterality Date  . BOWEL RESECTION  2004  . CORONARY ARTERY BYPASS GRAFT    . FINGER AMPUTATION Left 2011   5th  . KNEE ARTHROSCOPY Right 09/12/2015   Procedure: ARTHROSCOPY KNEE, LATERAL MENISECTOMY, CHONDROPLASTY;  Surgeon: Dereck Leep, MD;  Location: [facility] ORS;  Service: Orthopedics;  Laterality: Right;    Medications:  Current Outpatient Medications on File Prior to Visit  Medication Sig  . amLODipine (NORVASC) 10 MG tablet Take 1 tablet (10 mg total) by mouth daily.  . benazepril-hydrochlorthiazide (LOTENSIN HCT) 20-12.5 MG tablet Take 1 tablet by mouth daily.  . carvedilol (COREG) 25 MG tablet Take 2 tablets (50 mg total) by mouth 2 (two) times daily.  . cloNIDine (CATAPRES) 0.3 MG tablet Take 1 tablet (0.3 mg total) by mouth 2 (two) times daily.  Marland Kitchen docusate sodium (COLACE) 100 MG capsule Take 1 capsule (100 mg total) by mouth 2 (two) times daily.  Marland Kitchen doxazosin (CARDURA) 2 MG tablet Take 1 tablet (2 mg total) by mouth every morning.  Marland Kitchen esomeprazole (NEXIUM) 20 MG capsule Take 20 mg by mouth daily. Reported on 09/12/2015  . furosemide (LASIX) 40 MG tablet Take 1 tablet (40 mg total)  by mouth daily.  Marland Kitchen glipiZIDE (GLUCOTROL) 5 MG tablet Take 1 tablet (5 mg total) by mouth daily before breakfast.  . metFORMIN (GLUCOPHAGE) 500 MG tablet Take 1 tablet (500 mg total) by mouth 2 (two) times daily.  . potassium chloride SA (K-DUR) 20 MEQ tablet Take 1 tablet (20 mEq total) by mouth daily.  . pravastatin (PRAVACHOL) 40 MG tablet Take 1 tablet (40 mg total) by mouth at bedtime.  . senna (SENOKOT) 8.6 MG TABS tablet Take 2 tablets (17.2 mg total) by mouth at bedtime as needed for mild constipation. (Patient not taking: Reported on 02/11/2019)   No current facility-administered medications on file prior to visit.     Allergies:  Allergies  Allergen Reactions  . Oxycontin [Oxycodone] Nausea And Vomiting    Social History:  Social History   Socioeconomic History  . Marital status: Single     Spouse name: Not on file  . Number of children: Not on file  . Years of education: Not on file  . Highest education level: 12th grade  Occupational History  . Occupation: retired  Scientific laboratory technician  . Financial resource strain: Not hard at all  . Food insecurity    Worry: Never true    Inability: Never true  . Transportation needs    Medical: No    Non-medical: No  Tobacco Use  . Smoking status: Never Smoker  . Smokeless tobacco: Never Used  Substance and Sexual Activity  . Alcohol use: No    Alcohol/week: 0.0 standard drinks  . Drug use: No  . Sexual activity: Not on file  Lifestyle  . Physical activity    Days per week: 5 days    Minutes per session: 10 min  . Stress: Not at all  Relationships  . Social Herbalist on phone: Once a week    Gets together: Once a week    Attends religious service: More than 4 times per year    Active member of club or organization: No    Attends meetings of clubs or organizations: Never    Relationship status: Never married  . Intimate partner violence    Fear of current or ex partner: No    Emotionally abused: No    Physically abused: No    Forced sexual activity: No  Other Topics Concern  . Not on file  Social History Narrative  . Not on file   Social History   Tobacco Use  Smoking Status Never Smoker  Smokeless Tobacco Never Used   Social History   Substance and Sexual Activity  Alcohol Use No  . Alcohol/week: 0.0 standard drinks    Family History:  Family History  Problem Relation Age of Onset  . Arthritis Mother   . Hypertension Mother   . Breast cancer Mother   . Arthritis Father   . Hypertension Father   . Colon cancer Father     Past medical history, surgical history, medications, allergies, family history and social history reviewed with patient today and changes made to appropriate areas of the chart.   Review of Systems - General ROS: negative Psychological ROS: negative Ophthalmic ROS: negative  ENT ROS: negative Allergy and Immunology ROS: negative Hematological and Lymphatic ROS: negative Endocrine ROS: negative Respiratory ROS: no cough, shortness of breath, or wheezing Cardiovascular ROS: no chest pain or dyspnea on exertion Gastrointestinal ROS: no abdominal pain, change in bowel habits, or black or bloody stools Genito-Urinary ROS: no dysuria, trouble voiding, or hematuria  Musculoskeletal ROS: negative Neurological ROS: no TIA or stroke symptoms Dermatological ROS: negative All other ROS negative except what is listed above and in the HPI.      Objective:    BP 129/68   Pulse 73   Temp 98.8 F (37.1 C) (Oral)   Ht 5' 9.5" (1.765 m)   Wt 182 lb (82.6 kg)   PF 98 L/min   BMI 26.49 kg/m   Wt Readings from Last 3 Encounters:  02/11/19 182 lb (82.6 kg)  02/11/19 182 lb (82.6 kg)  11/13/18 187 lb 6.4 oz (85 kg)    Physical Exam Vitals signs and nursing note reviewed.  Constitutional:      General: He is not in acute distress.    Appearance: He is well-developed.  HENT:     Head: Atraumatic.     Right Ear: Tympanic membrane and external ear normal.     Left Ear: Tympanic membrane and external ear normal.     Nose: Nose normal.     Mouth/Throat:     Mouth: Mucous membranes are moist.     Pharynx: Oropharynx is clear.  Eyes:     General: No scleral icterus.    Conjunctiva/sclera: Conjunctivae normal.     Pupils: Pupils are equal, round, and reactive to light.  Neck:     Musculoskeletal: Normal range of motion and neck supple.  Cardiovascular:     Rate and Rhythm: Normal rate and regular rhythm.     Heart sounds: Normal heart sounds. No murmur.  Pulmonary:     Effort: Pulmonary effort is normal. No respiratory distress.     Breath sounds: Normal breath sounds.  Abdominal:     General: Bowel sounds are normal. There is no distension.     Palpations: Abdomen is soft. There is no mass.     Tenderness: There is no abdominal tenderness. There is no guarding.   Genitourinary:    Comments: Prostate mildly enlarged Musculoskeletal: Normal range of motion.        General: No tenderness.  Skin:    General: Skin is warm and dry.     Findings: No rash.     Comments: AK on left forearm and left outer ear, lesion suspicious for basal cell on right ear. Diffuse SKs on b/l forearms  Neurological:     General: No focal deficit present.     Mental Status: He is alert and oriented to person, place, and time.     Deep Tendon Reflexes: Reflexes are normal and symmetric.  Psychiatric:        Mood and Affect: Mood normal.        Behavior: Behavior normal.        Thought Content: Thought content normal.        Judgment: Judgment normal.    Diabetic Foot Exam - Simple   Simple Foot Form Diabetic Foot exam was performed with the following findings: Yes 02/11/2019  8:00 AM  Visual Inspection No deformities, no ulcerations, no other skin breakdown bilaterally: Yes Sensation Testing Intact to touch and monofilament testing bilaterally: Yes Pulse Check Posterior Tibialis and Dorsalis pulse intact bilaterally: Yes Comments     Results for orders placed or performed in visit on 02/11/19  CBC with Differential/Platelet  Result Value Ref Range   WBC 5.6 3.4 - 10.8 x10E3/uL   RBC 4.07 (L) 4.14 - 5.80 x10E6/uL   Hemoglobin 13.2 13.0 - 17.7 g/dL   Hematocrit 39.5 37.5 - 51.0 %   MCV  97 79 - 97 fL   MCH 32.4 26.6 - 33.0 pg   MCHC 33.4 31.5 - 35.7 g/dL   RDW 12.1 11.6 - 15.4 %   Platelets 177 150 - 450 x10E3/uL   Neutrophils 75 Not Estab. %   Lymphs 14 Not Estab. %   Monocytes 7 Not Estab. %   Eos 3 Not Estab. %   Basos 1 Not Estab. %   Neutrophils Absolute 4.2 1.4 - 7.0 x10E3/uL   Lymphocytes Absolute 0.8 0.7 - 3.1 x10E3/uL   Monocytes Absolute 0.4 0.1 - 0.9 x10E3/uL   EOS (ABSOLUTE) 0.2 0.0 - 0.4 x10E3/uL   Basophils Absolute 0.1 0.0 - 0.2 x10E3/uL   Immature Granulocytes 0 Not Estab. %   Immature Grans (Abs) 0.0 0.0 - 0.1 x10E3/uL  Comprehensive  metabolic panel  Result Value Ref Range   Glucose 269 (H) 65 - 99 mg/dL   BUN 17 8 - 27 mg/dL   Creatinine, Ser 1.19 0.76 - 1.27 mg/dL   GFR calc non Af Amer 59 (L) >59 mL/min/1.73   GFR calc Af Amer 68 >59 mL/min/1.73   BUN/Creatinine Ratio 14 10 - 24   Sodium 141 134 - 144 mmol/L   Potassium 3.5 3.5 - 5.2 mmol/L   Chloride 100 96 - 106 mmol/L   CO2 25 20 - 29 mmol/L   Calcium 9.7 8.6 - 10.2 mg/dL   Total Protein 6.5 6.0 - 8.5 g/dL   Albumin 4.3 3.7 - 4.7 g/dL   Globulin, Total 2.2 1.5 - 4.5 g/dL   Albumin/Globulin Ratio 2.0 1.2 - 2.2   Bilirubin Total 0.5 0.0 - 1.2 mg/dL   Alkaline Phosphatase 80 39 - 117 IU/L   AST 22 0 - 40 IU/L   ALT 12 0 - 44 IU/L  Lipid Panel w/o Chol/HDL Ratio  Result Value Ref Range   Cholesterol, Total 146 100 - 199 mg/dL   Triglycerides 162 (H) 0 - 149 mg/dL   HDL 44 >39 mg/dL   VLDL Cholesterol Cal 32 5 - 40 mg/dL   LDL Calculated 70 0 - 99 mg/dL  UA/M w/rflx Culture, Routine   Specimen: Urine   URINE  Result Value Ref Range   Specific Gravity, UA 1.010 1.005 - 1.030   pH, UA 5.5 5.0 - 7.5   Color, UA Yellow Yellow   Appearance Ur Clear Clear   Leukocytes,UA Negative Negative   Protein,UA Negative Negative/Trace   Glucose, UA 3+ (A) Negative   Ketones, UA Negative Negative   RBC, UA Negative Negative   Bilirubin, UA Negative Negative   Urobilinogen, Ur 0.2 0.2 - 1.0 mg/dL   Nitrite, UA Negative Negative  HgB A1c  Result Value Ref Range   Hgb A1c MFr Bld 7.5 (H) 4.8 - 5.6 %   Est. average glucose Bld gHb Est-mCnc 169 mg/dL      Assessment & Plan:   Problem List Items Addressed This Visit      Cardiovascular and Mediastinum   Parenchymal renal hypertension - Primary    Stable and WNL, continue current regimen      Relevant Orders   CBC with Differential/Platelet (Completed)   UA/M w/rflx Culture, Routine (Completed)   CAD (coronary artery disease)    Recheck lipids, adjust as needed. Continue current regimen      Relevant  Orders   Lipid Panel w/o Chol/HDL Ratio (Completed)     Digestive   GERD (gastroesophageal reflux disease)    Stable on nexium, continue current regimen  Endocrine   DM type 2 causing CKD stage 3 (HCC)    Recheck A1C, adjust as needed. Good lifestyle modifications reviewed      Relevant Orders   Comprehensive metabolic panel (Completed)   HgB A1c (Completed)     Other   Hyperlipidemia    Recheck lipids, adjust as needed      Relevant Orders   Lipid Panel w/o Chol/HDL Ratio (Completed)    Other Visit Diagnoses    Annual physical exam       AK (actinic keratosis)       Referral placed to Dermatology for skin checks, tx of lesions as needed   Relevant Orders   Ambulatory referral to Dermatology       Discussed aspirin prophylaxis for myocardial infarction prevention   LABORATORY TESTING:  Health maintenance labs ordered today as discussed above.   The natural history of prostate cancer and ongoing controversy regarding screening and potential treatment outcomes of prostate cancer has been discussed with the patient. The meaning of a false positive PSA and a false negative PSA has been discussed. He indicates understanding of the limitations of this screening test and wishes not to proceed with screening PSA testing.   IMMUNIZATIONS:   - Tdap: Tetanus vaccination status reviewed: last tetanus booster within 10 years. - Influenza: Postponed to flu season - Pneumovax: Up to date - Prevnar: Up to date - HPV: Not applicable - Zostavax vaccine: Refused  SCREENING: - Colonoscopy: Refused  Discussed with patient purpose of the colonoscopy is to detect colon cancer at curable precancerous or early stages   PATIENT COUNSELING:    Sexuality: Discussed sexually transmitted diseases, partner selection, use of condoms, avoidance of unintended pregnancy  and contraceptive alternatives.   Advised to avoid cigarette smoking.  I discussed with the patient that most  people either abstain from alcohol or drink within safe limits (<=14/week and <=4 drinks/occasion for males, <=7/weeks and <= 3 drinks/occasion for females) and that the risk for alcohol disorders and other health effects rises proportionally with the number of drinks per week and how often a drinker exceeds daily limits.  Discussed cessation/primary prevention of drug use and availability of treatment for abuse.   Diet: Encouraged to adjust caloric intake to maintain  or achieve ideal body weight, to reduce intake of dietary saturated fat and total fat, to limit sodium intake by avoiding high sodium foods and not adding table salt, and to maintain adequate dietary potassium and calcium preferably from fresh fruits, vegetables, and low-fat dairy products.    stressed the importance of regular exercise  Injury prevention: Discussed safety belts, safety helmets, smoke detector, smoking near bedding or upholstery.   Dental health: Discussed importance of regular tooth brushing, flossing, and dental visits.   Follow up plan: NEXT PREVENTATIVE PHYSICAL DUE IN 1 YEAR. Return in about 6 months (around 08/13/2019) for 6 month f/u.

## 2019-02-11 NOTE — Progress Notes (Signed)
Subjective:   Ryan Kirby is a 78 y.o. male who presents for Medicare Annual/Subsequent preventive examination.  Review of Systems:   Cardiac Risk Factors include: diabetes mellitus;hypertension;dyslipidemia;male gender;advanced age (>63men, >39 women)     Objective:    Vitals: BP 129/68   Pulse 73   Temp 98.8 F (37.1 C) (Oral)   Resp 16   Ht 5' 9.5" (1.765 m)   Wt 182 lb (82.6 kg)   BMI 26.49 kg/m   Body mass index is 26.49 kg/m.  Advanced Directives 02/11/2019 11/03/2018 10/29/2018 12/16/2017 09/12/2015 09/02/2015  Does Patient Have a Medical Advance Directive? No No No No Yes Yes  Type of Advance Directive - - - - - Living will  Copy of [place] in Chart? - - - - No - copy requested No - copy requested  Would patient like information on creating a medical advance directive? - - - Yes (MAU/Ambulatory/Procedural Areas - Information given) - -    Tobacco Social History   Tobacco Use  Smoking Status Never Smoker  Smokeless Tobacco Never Used     Counseling given: Not Answered   Clinical Intake:  Pre-visit preparation completed: Yes  Pain : No/denies pain     Nutritional Status: BMI 25 -29 Overweight Nutritional Risks: None Diabetes: Yes CBG done?: No Did pt. bring in CBG monitor from home?: No  How often do you need to have someone help you when you read instructions, pamphlets, or other written materials from your doctor or pharmacy?: 1 - Never What is the last grade level you completed in school?: 12th grade   Nutrition Risk Assessment:  Has the patient had any N/V/D within the last 2 months?  No  Does the patient have any non-healing wounds?  No  Has the patient had any unintentional weight loss or weight gain?  No   Diabetes:  Is the patient diabetic?  Yes  If diabetic, was a CBG obtained today?  Yes  Did the patient bring in their glucometer from home?  No  How often do you monitor your CBG's? daily.   Financial Strains and  Diabetes Management:  Are you having any financial strains with the device, your supplies or your medication? No .  Does the patient want to be seen by Chronic Care Management for management of their diabetes?  No  Would the patient like to be referred to a Nutritionist or for Diabetic Management?  No   Diabetic Exams:  Diabetic Eye Exam:  Overdue for diabetic eye exam. Pt has been advised about the importance in completing this exam. Discussed going to Dr.Woodard   Diabetic Foot Exam: Completed 02/11/2019.  Interpreter Needed?: No  Information entered by :: Tiffany Hill,LPN   Past Medical History:  Diagnosis Date  . Allergy   . CAD (coronary artery disease)   . Chronic kidney disease    pt is not on dialysis  . Diabetes mellitus without complication ([place])   . GERD (gastroesophageal reflux disease)   . Hyperlipidemia    Past Surgical History:  Procedure Laterality Date  . BOWEL RESECTION  2004  . CORONARY ARTERY BYPASS GRAFT    . FINGER AMPUTATION Left 2011   5th  . KNEE ARTHROSCOPY Right 09/12/2015   Procedure: ARTHROSCOPY KNEE, LATERAL MENISECTOMY, CHONDROPLASTY;  Surgeon: Dereck Leep, MD;  Location: [facility] ORS;  Service: Orthopedics;  Laterality: Right;   Family History  Problem Relation Age of Onset  . Arthritis Mother   . Hypertension  Mother   . Breast cancer Mother   . Arthritis Father   . Hypertension Father   . Colon cancer Father    Social History   Socioeconomic History  . Marital status: Single    Spouse name: Not on file  . Number of children: Not on file  . Years of education: Not on file  . Highest education level: 12th grade  Occupational History  . Occupation: retired  Scientific laboratory technician  . Financial resource strain: Not hard at all  . Food insecurity:    Worry: Never true    Inability: Never true  . Transportation needs:    Medical: No    Non-medical: No  Tobacco Use  . Smoking status: Never Smoker  . Smokeless tobacco: Never Used  Substance  and Sexual Activity  . Alcohol use: No    Alcohol/week: 0.0 standard drinks  . Drug use: No  . Sexual activity: Not on file  Lifestyle  . Physical activity:    Days per week: 5 days    Minutes per session: 10 min  . Stress: Not at all  Relationships  . Social connections:    Talks on phone: Once a week    Gets together: Once a week    Attends religious service: More than 4 times per year    Active member of club or organization: No    Attends meetings of clubs or organizations: Never    Relationship status: Never married  Other Topics Concern  . Not on file  Social History Narrative  . Not on file    Outpatient Encounter Medications as of 02/11/2019  Medication Sig  . amLODipine (NORVASC) 10 MG tablet Take 1 tablet (10 mg total) by mouth daily.  . benazepril-hydrochlorthiazide (LOTENSIN HCT) 20-12.5 MG tablet Take 1 tablet by mouth daily.  . carvedilol (COREG) 25 MG tablet Take 2 tablets (50 mg total) by mouth 2 (two) times daily.  . cloNIDine (CATAPRES) 0.3 MG tablet Take 1 tablet (0.3 mg total) by mouth 2 (two) times daily.  Marland Kitchen docusate sodium (COLACE) 100 MG capsule Take 1 capsule (100 mg total) by mouth 2 (two) times daily.  Marland Kitchen doxazosin (CARDURA) 2 MG tablet Take 1 tablet (2 mg total) by mouth every morning.  Marland Kitchen esomeprazole (NEXIUM) 20 MG capsule Take 20 mg by mouth daily. Reported on 09/12/2015  . furosemide (LASIX) 40 MG tablet Take 1 tablet (40 mg total) by mouth daily.  Marland Kitchen glipiZIDE (GLUCOTROL) 5 MG tablet Take 1 tablet (5 mg total) by mouth daily before breakfast.  . metFORMIN (GLUCOPHAGE) 500 MG tablet Take 1 tablet (500 mg total) by mouth 2 (two) times daily.  . potassium chloride SA (K-DUR) 20 MEQ tablet Take 1 tablet (20 mEq total) by mouth daily.  . pravastatin (PRAVACHOL) 40 MG tablet Take 1 tablet (40 mg total) by mouth at bedtime.  . senna (SENOKOT) 8.6 MG TABS tablet Take 2 tablets (17.2 mg total) by mouth at bedtime as needed for mild constipation. (Patient not  taking: Reported on 02/11/2019)   No facility-administered encounter medications on file as of 02/11/2019.     Activities of Daily Living In your present state of health, do you have any difficulty performing the following activities: 02/11/2019  Hearing? N  Vision? N  Difficulty concentrating or making decisions? N  Walking or climbing stairs? N  Dressing or bathing? N  Doing errands, shopping? N  Preparing Food and eating ? N  Using the Toilet? N  In the  past six months, have you accidently leaked urine? N  Do you have problems with loss of bowel control? N  Managing your Medications? N  Managing your Finances? N  Housekeeping or managing your Housekeeping? N  Some recent data might be hidden    Patient Care Team: Guadalupe Maple, MD as PCP - General (Family Medicine) Guadalupe Maple, MD as PCP - Family Medicine (Family Medicine) Garrel Ridgel, DPM as Consulting Physician (Podiatry)   Assessment:   This is a routine wellness examination for [place].  Exercise Activities and Dietary recommendations Current Exercise Habits: Home exercise routine, Type of exercise: walking, Time (Minutes): 15, Frequency (Times/Week): 7, Weekly Exercise (Minutes/Week): 105, Intensity: Mild, Exercise limited by: None identified  Goals    . DIET - INCREASE WATER INTAKE     recommend drinking at least 6-8 glasses of water a day.       Fall Risk Fall Risk  02/11/2019 11/13/2018 12/24/2017 12/16/2017 09/23/2017  Falls in the past year? 1 0 No No No  Number falls in past yr: 1 0 - - -  Injury with Fall? 0 0 - - -  Follow up - - - - -   FALL RISK PREVENTION PERTAINING TO THE HOME:  Any stairs in or around the home? Yes  If so, are there any without handrails? No   Home free of loose throw rugs in walkways, pet beds, electrical cords, etc? Yes  Adequate lighting in your home to reduce risk of falls? Yes   ASSISTIVE DEVICES UTILIZED TO PREVENT FALLS:  Life alert? No  Use of a cane, walker or  w/c? Yes, cane as needed  Grab bars in the bathroom? No  Shower chair or bench in shower? No  Elevated toilet seat or a handicapped toilet? No    TIMED UP AND GO:  Was the test performed? Yes .  Length of time to ambulate 10 feet: 10 sec.   GAIT:  Appearance of gait: Gait steady and fast without the use of an assistive device.  Education: Fall risk prevention has been discussed.  Intervention(s) required? No   DME/home health order needed?  No    Depression Screen PHQ 2/9 Scores 02/11/2019 12/16/2017 09/23/2017 06/24/2017  PHQ - 2 Score 1 2 0 0  PHQ- 9 Score 3 6 - -    Cognitive Function     6CIT Screen 02/11/2019 12/16/2017  What Year? 0 points 0 points  What month? 0 points 0 points  What time? 0 points 0 points  Count back from 20 0 points 0 points  Months in reverse 0 points 0 points  Repeat phrase 0 points 0 points  Total Score 0 0    Immunization History  Administered Date(s) Administered  . Influenza, High Dose Seasonal PF 07/17/2016, 06/24/2017, 07/02/2018  . Influenza,inj,Quad PF,6+ Mos 06/02/2015  . Pneumococcal Conjugate-13 12/01/2015  . Pneumococcal-Unspecified 07/01/2007  . Td 05/25/2008, 07/02/2018    Qualifies for Shingles Vaccine? Yes  Zostavax completed n/a. Due for Shingrix. Education has been provided regarding the importance of this vaccine. Pt has been advised to call insurance company to determine out of pocket expense. Advised may also receive vaccine at local pharmacy or Health Dept. Verbalized acceptance and understanding.  Tdap: up to date   Flu Vaccine: up to date   Pneumococcal Vaccine: up to date   Screening Tests Health Maintenance  Topic Date Due  . OPHTHALMOLOGY EXAM  11/16/2018  . FOOT EXAM  12/25/2018  . HEMOGLOBIN  A1C  01/01/2019  . COLONOSCOPY  02/11/2020 (Originally 08/21/2018)  . INFLUENZA VACCINE  04/04/2019  . TETANUS/TDAP  07/02/2028  . PNA vac Low Risk Adult  Completed   Cancer Screenings:  Colorectal Screening:  declined at this time.   Lung Cancer Screening: (Low Dose CT Chest recommended if Age 44-80 years, 30 pack-year currently smoking OR have quit w/in 15years.) does not qualify.    Additional Screening:  Hepatitis C Screening: does not qualify  Dental Screening: Recommended annual dental exams for proper oral hygiene  Community Resource Referral:  CRR required this visit?  No        Plan:  I have personally reviewed and addressed the Medicare Annual Wellness questionnaire and have noted the following in the patient's chart:  A. Medical and social history B. Use of alcohol, tobacco or illicit drugs  C. Current medications and supplements D. Functional ability and status E.  Nutritional status F.  Physical activity G. Advance directives H. List of other physicians I.  Hospitalizations, surgeries, and ER visits in previous 12 months J.  [place] such as hearing and vision if needed, cognitive and depression L. Referrals and appointments   In addition, I have reviewed and discussed with patient certain preventive protocols, quality metrics, and best practice recommendations. A written personalized care plan for preventive services as well as general preventive health recommendations were provided to patient.   Signed,   Bevelyn Ngo, LPN  8/[id number]/6195 Nurse Health Advisor  Nurse Notes: none

## 2019-02-12 ENCOUNTER — Ambulatory Visit: Payer: Self-pay

## 2019-02-12 DIAGNOSIS — M25552 Pain in left hip: Secondary | ICD-10-CM | POA: Diagnosis not present

## 2019-02-12 DIAGNOSIS — M545 Low back pain: Secondary | ICD-10-CM | POA: Diagnosis not present

## 2019-02-12 LAB — CBC WITH DIFFERENTIAL/PLATELET
Basophils Absolute: 0.1 10*3/uL (ref 0.0–0.2)
Basos: 1 %
EOS (ABSOLUTE): 0.2 10*3/uL (ref 0.0–0.4)
Eos: 3 %
Hematocrit: 39.5 % (ref 37.5–51.0)
Hemoglobin: 13.2 g/dL (ref 13.0–17.7)
Immature Grans (Abs): 0 10*3/uL (ref 0.0–0.1)
Immature Granulocytes: 0 %
Lymphocytes Absolute: 0.8 10*3/uL (ref 0.7–3.1)
Lymphs: 14 %
MCH: 32.4 pg (ref 26.6–33.0)
MCHC: 33.4 g/dL (ref 31.5–35.7)
MCV: 97 fL (ref 79–97)
Monocytes Absolute: 0.4 10*3/uL (ref 0.1–0.9)
Monocytes: 7 %
Neutrophils Absolute: 4.2 10*3/uL (ref 1.4–7.0)
Neutrophils: 75 %
Platelets: 177 10*3/uL (ref 150–450)
RBC: 4.07 x10E6/uL — ABNORMAL LOW (ref 4.14–5.80)
RDW: 12.1 % (ref 11.6–15.4)
WBC: 5.6 10*3/uL (ref 3.4–10.8)

## 2019-02-12 LAB — COMPREHENSIVE METABOLIC PANEL
ALT: 12 IU/L (ref 0–44)
AST: 22 IU/L (ref 0–40)
Albumin/Globulin Ratio: 2 (ref 1.2–2.2)
Albumin: 4.3 g/dL (ref 3.7–4.7)
Alkaline Phosphatase: 80 IU/L (ref 39–117)
BUN/Creatinine Ratio: 14 (ref 10–24)
BUN: 17 mg/dL (ref 8–27)
Bilirubin Total: 0.5 mg/dL (ref 0.0–1.2)
CO2: 25 mmol/L (ref 20–29)
Calcium: 9.7 mg/dL (ref 8.6–10.2)
Chloride: 100 mmol/L (ref 96–106)
Creatinine, Ser: 1.19 mg/dL (ref 0.76–1.27)
GFR calc Af Amer: 68 mL/min/{1.73_m2} (ref >59)
GFR calc non Af Amer: 59 mL/min/{1.73_m2} — ABNORMAL LOW (ref >59)
Globulin, Total: 2.2 g/dL (ref 1.5–4.5)
Glucose: 269 mg/dL — ABNORMAL HIGH (ref 65–99)
Potassium: 3.5 mmol/L (ref 3.5–5.2)
Sodium: 141 mmol/L (ref 134–144)
Total Protein: 6.5 g/dL (ref 6.0–8.5)

## 2019-02-12 LAB — LIPID PANEL W/O CHOL/HDL RATIO
Cholesterol, Total: 146 mg/dL (ref 100–199)
HDL: 44 mg/dL (ref >39)
LDL Calculated: 70 mg/dL (ref 0–99)
Triglycerides: 162 mg/dL — ABNORMAL HIGH (ref 0–149)
VLDL Cholesterol Cal: 32 mg/dL (ref 5–40)

## 2019-02-12 LAB — HEMOGLOBIN A1C
Est. average glucose Bld gHb Est-mCnc: 169 mg/dL
Hgb A1c MFr Bld: 7.5 % — ABNORMAL HIGH (ref 4.8–5.6)

## 2019-02-13 ENCOUNTER — Other Ambulatory Visit: Payer: Self-pay | Admitting: Family Medicine

## 2019-02-13 MED ORDER — JARDIANCE 10 MG PO TABS: 10.0000 mg | ORAL_TABLET | Freq: Every day | ORAL | 2 refills | Status: DC

## 2019-02-13 NOTE — Progress Notes (Signed)
f °

## 2019-02-16 NOTE — Assessment & Plan Note (Signed)
Stable on nexium, continue current regimen

## 2019-02-16 NOTE — Assessment & Plan Note (Signed)
Recheck A1C, adjust as needed. Good lifestyle modifications reviewed

## 2019-02-16 NOTE — Assessment & Plan Note (Signed)
Stable and WNL, continue current regimen 

## 2019-02-16 NOTE — Assessment & Plan Note (Signed)
Recheck lipids, adjust as needed 

## 2019-02-16 NOTE — Assessment & Plan Note (Signed)
Recheck lipids, adjust as needed. Continue current regimen 

## 2019-02-25 DIAGNOSIS — L570 Actinic keratosis: Secondary | ICD-10-CM | POA: Diagnosis not present

## 2019-02-25 DIAGNOSIS — I781 Nevus, non-neoplastic: Secondary | ICD-10-CM | POA: Diagnosis not present

## 2019-02-25 DIAGNOSIS — D485 Neoplasm of uncertain behavior of skin: Secondary | ICD-10-CM | POA: Diagnosis not present

## 2019-03-03 DIAGNOSIS — M48062 Spinal stenosis, lumbar region with neurogenic claudication: Secondary | ICD-10-CM | POA: Diagnosis not present

## 2019-03-03 DIAGNOSIS — M5126 Other intervertebral disc displacement, lumbar region: Secondary | ICD-10-CM | POA: Diagnosis not present

## 2019-03-03 DIAGNOSIS — M7062 Trochanteric bursitis, left hip: Secondary | ICD-10-CM | POA: Diagnosis not present

## 2019-03-03 DIAGNOSIS — M5136 Other intervertebral disc degeneration, lumbar region: Secondary | ICD-10-CM | POA: Diagnosis not present

## 2019-03-03 DIAGNOSIS — M5416 Radiculopathy, lumbar region: Secondary | ICD-10-CM | POA: Diagnosis not present

## 2019-03-03 DIAGNOSIS — M461 Sacroiliitis, not elsewhere classified: Secondary | ICD-10-CM | POA: Diagnosis not present

## 2019-03-03 DIAGNOSIS — M62830 Muscle spasm of back: Secondary | ICD-10-CM | POA: Diagnosis not present

## 2019-03-31 DIAGNOSIS — L570 Actinic keratosis: Secondary | ICD-10-CM | POA: Diagnosis not present

## 2019-03-31 DIAGNOSIS — C44519 Basal cell carcinoma of skin of other part of trunk: Secondary | ICD-10-CM | POA: Diagnosis not present

## 2019-03-31 DIAGNOSIS — D485 Neoplasm of uncertain behavior of skin: Secondary | ICD-10-CM | POA: Diagnosis not present

## 2019-03-31 DIAGNOSIS — L821 Other seborrheic keratosis: Secondary | ICD-10-CM | POA: Diagnosis not present

## 2019-04-14 DIAGNOSIS — C44519 Basal cell carcinoma of skin of other part of trunk: Secondary | ICD-10-CM | POA: Diagnosis not present

## 2019-04-23 ENCOUNTER — Other Ambulatory Visit: Payer: Self-pay | Admitting: Family Medicine

## 2019-05-05 DIAGNOSIS — C44519 Basal cell carcinoma of skin of other part of trunk: Secondary | ICD-10-CM | POA: Diagnosis not present

## 2019-05-19 ENCOUNTER — Telehealth: Payer: Self-pay | Admitting: Family Medicine

## 2019-05-19 NOTE — Telephone Encounter (Signed)
Called pt to set up virtual appt. Pt wanted to cancel and see about coming into the office to see someone instead. Please advise if this is ok.

## 2019-05-20 NOTE — Telephone Encounter (Signed)
yes

## 2019-05-20 NOTE — Telephone Encounter (Signed)
Thank you, scheduled with jolene

## 2019-05-21 ENCOUNTER — Ambulatory Visit: Payer: Self-pay | Admitting: Family Medicine

## 2019-05-26 DIAGNOSIS — C44519 Basal cell carcinoma of skin of other part of trunk: Secondary | ICD-10-CM | POA: Diagnosis not present

## 2019-05-28 ENCOUNTER — Encounter: Payer: Self-pay | Admitting: Nurse Practitioner

## 2019-05-29 ENCOUNTER — Other Ambulatory Visit: Payer: Self-pay

## 2019-05-29 ENCOUNTER — Ambulatory Visit (INDEPENDENT_AMBULATORY_CARE_PROVIDER_SITE_OTHER): Payer: Medicare HMO | Admitting: Nurse Practitioner

## 2019-05-29 ENCOUNTER — Encounter: Payer: Self-pay | Admitting: Nurse Practitioner

## 2019-05-29 VITALS — BP 133/75 | HR 66 | Temp 97.8°F

## 2019-05-29 DIAGNOSIS — E785 Hyperlipidemia, unspecified: Secondary | ICD-10-CM | POA: Diagnosis not present

## 2019-05-29 DIAGNOSIS — E1169 Type 2 diabetes mellitus with other specified complication: Secondary | ICD-10-CM | POA: Diagnosis not present

## 2019-05-29 DIAGNOSIS — I129 Hypertensive chronic kidney disease with stage 1 through stage 4 chronic kidney disease, or unspecified chronic kidney disease: Secondary | ICD-10-CM | POA: Diagnosis not present

## 2019-05-29 DIAGNOSIS — N183 Chronic kidney disease, stage 3 unspecified: Secondary | ICD-10-CM

## 2019-05-29 DIAGNOSIS — E1122 Type 2 diabetes mellitus with diabetic chronic kidney disease: Secondary | ICD-10-CM

## 2019-05-29 LAB — MICROALBUMIN, URINE WAIVED
Creatinine, Urine Waived: 10 mg/dL (ref 10–300)
Microalb, Ur Waived: 10 mg/L (ref 0–19)

## 2019-05-29 LAB — BAYER DCA HB A1C WAIVED: HB A1C (BAYER DCA - WAIVED): 7.3 % — ABNORMAL HIGH (ref <7.0)

## 2019-05-29 NOTE — Assessment & Plan Note (Signed)
Chronic, ongoing.  Multiple BP meds present.  Monitor for orthostatic hypotension symptoms with age and adjust medications if present.  BP at goal today.  Continue current medication regimen at this time.  CMP today.  Return in 3 months.

## 2019-05-29 NOTE — Assessment & Plan Note (Signed)
Chronic, ongoing.  A1C today 7.3%.  Will place CCM referral to assist with medication regimen and diet.  Have discussed at length diet regimen for diabetes with patient and recommend cutting back on sweets.  CMP today, consider increase in Jardiance to 25 MG if kidney function remains stable.  Recommend he check BS every morning and document for provider.  Return in 3 months.

## 2019-05-29 NOTE — Assessment & Plan Note (Signed)
Chronic, stable with urine micro ALB 10 and A:C 30-300.  Continue Benazepril for kidney protection.  CMP today.  If decline in kidney function then consider referral to nephrology in future.

## 2019-05-29 NOTE — Patient Instructions (Signed)
Carbohydrate Counting for Diabetes Mellitus, Adult  Carbohydrate counting is a method of keeping track of how many carbohydrates you eat. Eating carbohydrates naturally increases the amount of sugar (glucose) in the blood. Counting how many carbohydrates you eat helps keep your blood glucose within normal limits, which helps you manage your diabetes (diabetes mellitus). It is important to know how many carbohydrates you can safely have in each meal. This is different for every person. A diet and nutrition specialist (registered dietitian) can help you make a meal plan and calculate how many carbohydrates you should have at each meal and snack. Carbohydrates are found in the following foods:  Grains, such as breads and cereals.  Dried beans and soy products.  Starchy vegetables, such as potatoes, peas, and corn.  Fruit and fruit juices.  Milk and yogurt.  Sweets and snack foods, such as cake, cookies, candy, chips, and soft drinks. How do I count carbohydrates? There are two ways to count carbohydrates in food. You can use either of the methods or a combination of both. Reading "Nutrition Facts" on packaged food The "Nutrition Facts" list is included on the labels of almost all packaged foods and beverages in the [place] It includes:  The serving size.  Information about nutrients in each serving, including the grams (g) of carbohydrate per serving. To use the "Nutrition Facts":  Decide how many servings you will have.  Multiply the number of servings by the number of carbohydrates per serving.  The resulting number is the total amount of carbohydrates that you will be having. Learning standard serving sizes of other foods When you eat carbohydrate foods that are not packaged or do not include "Nutrition Facts" on the label, you need to measure the servings in order to count the amount of carbohydrates:  Measure the foods that you will eat with a food scale or measuring cup, if needed.   Decide how many standard-size servings you will eat.  Multiply the number of servings by 15. Most carbohydrate-rich foods have about 15 g of carbohydrates per serving. ? For example, if you eat 8 oz (170 g) of strawberries, you will have eaten 2 servings and 30 g of carbohydrates (2 servings x 15 g = 30 g).  For foods that have more than one food mixed, such as soups and casseroles, you must count the carbohydrates in each food that is included. The following list contains standard serving sizes of common carbohydrate-rich foods. Each of these servings has about 15 g of carbohydrates:   hamburger bun or  English muffin.   oz (15 mL) syrup.   oz (14 g) jelly.  1 slice of bread.  1 six-inch tortilla.  3 oz (85 g) cooked rice or pasta.  4 oz (113 g) cooked dried beans.  4 oz (113 g) starchy vegetable, such as peas, corn, or potatoes.  4 oz (113 g) hot cereal.  4 oz (113 g) mashed potatoes or  of a large baked potato.  4 oz (113 g) canned or frozen fruit.  4 oz (120 mL) fruit juice.  4-6 crackers.  6 chicken nuggets.  6 oz (170 g) unsweetened dry cereal.  6 oz (170 g) plain fat-free yogurt or yogurt sweetened with artificial sweeteners.  8 oz (240 mL) milk.  8 oz (170 g) fresh fruit or one small piece of fruit.  24 oz (680 g) popped popcorn. Example of carbohydrate counting Sample meal  3 oz (85 g) chicken breast.  6 oz (170 g)   brown rice.  4 oz (113 g) corn.  8 oz (240 mL) milk.  8 oz (170 g) strawberries with sugar-free whipped topping. Carbohydrate calculation 1. Identify the foods that contain carbohydrates: ? Rice. ? Corn. ? Milk. ? Strawberries. 2. Calculate how many servings you have of each food: ? 2 servings rice. ? 1 serving corn. ? 1 serving milk. ? 1 serving strawberries. 3. Multiply each number of servings by 15 g: ? 2 servings rice x 15 g = 30 g. ? 1 serving corn x 15 g = 15 g. ? 1 serving milk x 15 g = 15 g. ? 1 serving  strawberries x 15 g = 15 g. 4. Add together all of the amounts to find the total grams of carbohydrates eaten: ? 30 g + 15 g + 15 g + 15 g = 75 g of carbohydrates total. Summary  Carbohydrate counting is a method of keeping track of how many carbohydrates you eat.  Eating carbohydrates naturally increases the amount of sugar (glucose) in the blood.  Counting how many carbohydrates you eat helps keep your blood glucose within normal limits, which helps you manage your diabetes.  A diet and nutrition specialist (registered dietitian) can help you make a meal plan and calculate how many carbohydrates you should have at each meal and snack. This information is not intended to replace advice given to you by your health care provider. Make sure you discuss any questions you have with your health care provider. Document Released: 08/20/2005 Document Revised: 03/14/2017 Document Reviewed: 02/01/2016 Elsevier Patient Education  2020 [employer]  

## 2019-05-29 NOTE — Progress Notes (Signed)
BP 133/75   Pulse 66   Temp 97.8 F (36.6 C) (Oral)   SpO2 99%    Subjective:    Patient ID: Ryan Kirby, male    DOB: 16-Jun-1941, 78 y.o.   MRN: [id number]  HPI: Ryan Kirby is a 78 y.o. male  Chief Complaint  Patient presents with  . Diabetes  . Hyperlipidemia  . Hypertension   HYPERTENSION / HYPERLIPIDEMIA Continues on Pravastatin, Furosemide, Clonidine, Amlodipine, and Benazepril-HCTZ + Doxazosin (BPH).  Also takes daily K+. Satisfied with current treatment? yes Duration of hypertension: chronic BP monitoring frequency: not checking BP range:  BP medication side effects: no Duration of hyperlipidemia: chronic Cholesterol medication side effects: no Cholesterol supplements: none Medication compliance: good compliance Aspirin: no Recent stressors: no Recurrent headaches: no Visual changes: no Palpitations: no Dyspnea: no Chest pain: no Lower extremity edema: no Dizzy/lightheaded: no   DIABETES Last A1C in June 7.5% and Jardiance 10 MG added, he denies ADR with medication and is drinking plenty of fluid.  He also continues on Glipizide 5 MG daily, Metformin 500 MG twice a day.  Does endorse poor diet, loves his sweets and drinks Diet Mountain Dew and sweet tea daily.  Does not check sugars at home, states he was told he did not need to.  Discussed with him that it would be good to check sugars every morning. Hypoglycemic episodes:no Polydipsia/polyuria: no Visual disturbance: no Chest pain: no Paresthesias: no Glucose Monitoring: no  Accucheck frequency: TID  Fasting glucose:  Post prandial:  Evening:  Before meals: Taking Insulin?: no  Long acting insulin:  Short acting insulin: Blood Pressure Monitoring: not checking Retinal Examination: Not up to Date Foot Exam: Up to Date Pneumovax: Up to Date Influenza: refused Aspirin: no   CHRONIC KIDNEY DISEASE CKD status: stable Medications renally dose: yes Previous renal evaluation: no Pneumovax:   Up to Date Influenza Vaccine:  refused   Relevant past medical, surgical, family and social history reviewed and updated as indicated. Interim medical history since our last visit reviewed. Allergies and medications reviewed and updated.  Review of Systems  Constitutional: Negative for activity change, diaphoresis, fatigue and fever.  Respiratory: Negative for cough, chest tightness, shortness of breath and wheezing.   Cardiovascular: Negative for chest pain, palpitations and leg swelling.  Gastrointestinal: Negative for abdominal distention, abdominal pain, constipation, diarrhea, nausea and vomiting.  Endocrine: Negative for cold intolerance, heat intolerance, polydipsia, polyphagia and polyuria.  Neurological: Negative for dizziness, syncope, weakness, light-headedness, numbness and headaches.  Psychiatric/Behavioral: Negative.     Per HPI unless specifically indicated above     Objective:    BP 133/75   Pulse 66   Temp 97.8 F (36.6 C) (Oral)   SpO2 99%   Wt Readings from Last 3 Encounters:  02/11/19 182 lb (82.6 kg)  02/11/19 182 lb (82.6 kg)  11/13/18 187 lb 6.4 oz (85 kg)    Physical Exam Vitals signs and nursing note reviewed.  Constitutional:      General: He is awake. He is not in acute distress.    Appearance: He is well-developed. He is obese. He is not ill-appearing.  HENT:     Head: Normocephalic and atraumatic.     Right Ear: Hearing normal. No drainage.     Left Ear: Hearing normal. No drainage.  Eyes:     General: Lids are normal.        Right eye: No discharge.        Left eye:  No discharge.     Conjunctiva/sclera: Conjunctivae normal.     Pupils: Pupils are equal, round, and reactive to light.  Neck:     Musculoskeletal: Normal range of motion and neck supple.     Thyroid: No thyromegaly.     Vascular: No carotid bruit.  Cardiovascular:     Rate and Rhythm: Normal rate and regular rhythm.     Heart sounds: Normal heart sounds, S1 normal and S2  normal. No murmur. No gallop.   Pulmonary:     Effort: Pulmonary effort is normal. No accessory muscle usage or respiratory distress.     Breath sounds: Normal breath sounds.  Abdominal:     General: Bowel sounds are normal.     Palpations: Abdomen is soft.  Musculoskeletal: Normal range of motion.     Right lower leg: No edema.     Left lower leg: No edema.  Skin:    General: Skin is warm and dry.  Neurological:     Mental Status: He is alert and oriented to person, place, and time.  Psychiatric:        Mood and Affect: Mood normal.        Behavior: Behavior normal. Behavior is cooperative.        Thought Content: Thought content normal.        Judgment: Judgment normal.     Results for orders placed or performed in visit on 02/11/19  CBC with Differential/Platelet  Result Value Ref Range   WBC 5.6 3.4 - 10.8 x10E3/uL   RBC 4.07 (L) 4.14 - 5.80 x10E6/uL   Hemoglobin 13.2 13.0 - 17.7 g/dL   Hematocrit 39.5 37.5 - 51.0 %   MCV 97 79 - 97 fL   MCH 32.4 26.6 - 33.0 pg   MCHC 33.4 31.5 - 35.7 g/dL   RDW 12.1 11.6 - 15.4 %   Platelets 177 150 - 450 x10E3/uL   Neutrophils 75 Not Estab. %   Lymphs 14 Not Estab. %   Monocytes 7 Not Estab. %   Eos 3 Not Estab. %   Basos 1 Not Estab. %   Neutrophils Absolute 4.2 1.4 - 7.0 x10E3/uL   Lymphocytes Absolute 0.8 0.7 - 3.1 x10E3/uL   Monocytes Absolute 0.4 0.1 - 0.9 x10E3/uL   EOS (ABSOLUTE) 0.2 0.0 - 0.4 x10E3/uL   Basophils Absolute 0.1 0.0 - 0.2 x10E3/uL   Immature Granulocytes 0 Not Estab. %   Immature Grans (Abs) 0.0 0.0 - 0.1 x10E3/uL  Comprehensive metabolic panel  Result Value Ref Range   Glucose 269 (H) 65 - 99 mg/dL   BUN 17 8 - 27 mg/dL   Creatinine, Ser 1.19 0.76 - 1.27 mg/dL   GFR calc non Af Amer 59 (L) >59 mL/min/1.73   GFR calc Af Amer 68 >59 mL/min/1.73   BUN/Creatinine Ratio 14 10 - 24   Sodium 141 134 - 144 mmol/L   Potassium 3.5 3.5 - 5.2 mmol/L   Chloride 100 96 - 106 mmol/L   CO2 25 20 - 29 mmol/L    Calcium 9.7 8.6 - 10.2 mg/dL   Total Protein 6.5 6.0 - 8.5 g/dL   Albumin 4.3 3.7 - 4.7 g/dL   Globulin, Total 2.2 1.5 - 4.5 g/dL   Albumin/Globulin Ratio 2.0 1.2 - 2.2   Bilirubin Total 0.5 0.0 - 1.2 mg/dL   Alkaline Phosphatase 80 39 - 117 IU/L   AST 22 0 - 40 IU/L   ALT 12 0 - 44 IU/L  Lipid Panel w/o Chol/HDL Ratio  Result Value Ref Range   Cholesterol, Total 146 100 - 199 mg/dL   Triglycerides 162 (H) 0 - 149 mg/dL   HDL 44 >39 mg/dL   VLDL Cholesterol Cal 32 5 - 40 mg/dL   LDL Calculated 70 0 - 99 mg/dL  UA/M w/rflx Culture, Routine   Specimen: Urine   URINE  Result Value Ref Range   Specific Gravity, UA 1.010 1.005 - 1.030   pH, UA 5.5 5.0 - 7.5   Color, UA Yellow Yellow   Appearance Ur Clear Clear   Leukocytes,UA Negative Negative   Protein,UA Negative Negative/Trace   Glucose, UA 3+ (A) Negative   Ketones, UA Negative Negative   RBC, UA Negative Negative   Bilirubin, UA Negative Negative   Urobilinogen, Ur 0.2 0.2 - 1.0 mg/dL   Nitrite, UA Negative Negative  HgB A1c  Result Value Ref Range   Hgb A1c MFr Bld 7.5 (H) 4.8 - 5.6 %   Est. average glucose Bld gHb Est-mCnc 169 mg/dL      Assessment & Plan:   Problem List Items Addressed This Visit      Cardiovascular and Mediastinum   Parenchymal renal hypertension    Chronic, ongoing.  Multiple BP meds present.  Monitor for orthostatic hypotension symptoms with age and adjust medications if present.  BP at goal today.  Continue current medication regimen at this time.  CMP today.  Return in 3 months.        Endocrine   Hyperlipidemia associated with type 2 diabetes mellitus (HCC)    Chronic, ongoing.  Continue current medication regimen and adjust as needed.  CMP today.      DM type 2 causing CKD stage 3 (HCC) - Primary    Chronic, ongoing.  A1C today 7.3%.  Will place CCM referral to assist with medication regimen and diet.  Have discussed at length diet regimen for diabetes with patient and recommend cutting  back on sweets.  CMP today, consider increase in Jardiance to 25 MG if kidney function remains stable.  Recommend he check BS every morning and document for provider.  Return in 3 months.      Relevant Orders   [facility]ayer DCA Hb A1c Waived   Comprehensive metabolic panel   Microalbumin, Urine Waived   Referral to Chronic Care Management Services   CKD stage 3 secondary to diabetes (HCC)    Chronic, stable with urine micro ALB 10 and A:C 30-300.  Continue Benazepril for kidney protection.  CMP today.  If decline in kidney function then consider referral to nephrology in future.         Time: 25 minutes, >50% spent counseling on diabetic diet and medication regimen  Follow up plan: Return in about 3 months (around 08/28/2019) for T2DM, HTN/HLD, CKD.

## 2019-05-29 NOTE — Assessment & Plan Note (Signed)
Chronic, ongoing.  Continue current medication regimen and adjust as needed.  CMP today.

## 2019-05-30 LAB — COMPREHENSIVE METABOLIC PANEL
ALT: 9 IU/L (ref 0–44)
AST: 19 IU/L (ref 0–40)
Albumin/Globulin Ratio: 1.8 (ref 1.2–2.2)
Albumin: 4.6 g/dL (ref 3.7–4.7)
Alkaline Phosphatase: 104 IU/L (ref 39–117)
BUN/Creatinine Ratio: 15 (ref 10–24)
BUN: 18 mg/dL (ref 8–27)
Bilirubin Total: 0.5 mg/dL (ref 0.0–1.2)
CO2: 26 mmol/L (ref 20–29)
Calcium: 10.4 mg/dL — ABNORMAL HIGH (ref 8.6–10.2)
Chloride: 101 mmol/L (ref 96–106)
Creatinine, Ser: 1.24 mg/dL (ref 0.76–1.27)
GFR calc Af Amer: 64 mL/min/{1.73_m2} (ref >59)
GFR calc non Af Amer: 56 mL/min/{1.73_m2} — ABNORMAL LOW (ref >59)
Globulin, Total: 2.5 g/dL (ref 1.5–4.5)
Glucose: 203 mg/dL — ABNORMAL HIGH (ref 65–99)
Potassium: 4 mmol/L (ref 3.5–5.2)
Sodium: 141 mmol/L (ref 134–144)
Total Protein: 7.1 g/dL (ref 6.0–8.5)

## 2019-06-03 ENCOUNTER — Telehealth: Payer: Self-pay

## 2019-06-03 ENCOUNTER — Telehealth: Payer: Self-pay | Admitting: Family Medicine

## 2019-06-03 DIAGNOSIS — M5136 Other intervertebral disc degeneration, lumbar region: Secondary | ICD-10-CM | POA: Diagnosis not present

## 2019-06-03 DIAGNOSIS — M5126 Other intervertebral disc displacement, lumbar region: Secondary | ICD-10-CM | POA: Diagnosis not present

## 2019-06-03 DIAGNOSIS — M5416 Radiculopathy, lumbar region: Secondary | ICD-10-CM | POA: Diagnosis not present

## 2019-06-03 NOTE — Telephone Encounter (Signed)
Copied from [place] [id number]. Topic: General - Other >> Jun 01, 2019 10:39 AM Rayann Heman wrote: Reason for CRM: pt called and stated that he missed a call regarding labs and would like a call back. Please advise >> Jun 03, 2019 12:14 PM Guadalupe Maple, MD wrote: Ryan Kirby ordered

## 2019-06-03 NOTE — Telephone Encounter (Signed)
-----   Message from Guadalupe Maple, MD sent at 06/03/2019 12:16 PM EDT ----- Regarding: lab Please let patient know kidney function is remaining at baseline, kidney disease stage 3 with no changes.  Calcium level is slightly elevated, which we can continue to monitor.  Liver function and remaining electrolytes remain stable.  Have a great day!!

## 2019-06-03 NOTE — Telephone Encounter (Signed)
Called and left patient a VM asking for him to please return my call.  

## 2019-06-03 NOTE — Telephone Encounter (Signed)
Patient notified

## 2019-06-04 ENCOUNTER — Telehealth: Payer: Self-pay | Admitting: Family Medicine

## 2019-06-04 NOTE — Telephone Encounter (Signed)
Copied from [place] [id number]. Topic: General - Other >> Jun 01, 2019 10:39 AM Rayann Heman wrote: Reason for CRM: pt called and stated that he missed a call regarding labs and would like a call back. Please advise >> Jun 03, 2019  2:10 PM Georgina Peer, [place] wrote: See phone message >> Jun 03, 2019 12:14 PM Guadalupe Maple, MD wrote: Marvia Pickles ordered

## 2019-06-16 DIAGNOSIS — M5416 Radiculopathy, lumbar region: Secondary | ICD-10-CM | POA: Diagnosis not present

## 2019-06-16 DIAGNOSIS — M5136 Other intervertebral disc degeneration, lumbar region: Secondary | ICD-10-CM | POA: Diagnosis not present

## 2019-06-16 DIAGNOSIS — M5126 Other intervertebral disc displacement, lumbar region: Secondary | ICD-10-CM | POA: Diagnosis not present

## 2019-06-30 DIAGNOSIS — E089 Diabetes mellitus due to underlying condition without complications: Secondary | ICD-10-CM | POA: Diagnosis not present

## 2019-06-30 DIAGNOSIS — H524 Presbyopia: Secondary | ICD-10-CM | POA: Diagnosis not present

## 2019-06-30 DIAGNOSIS — E119 Type 2 diabetes mellitus without complications: Secondary | ICD-10-CM | POA: Diagnosis not present

## 2019-06-30 DIAGNOSIS — D3131 Benign neoplasm of right choroid: Secondary | ICD-10-CM | POA: Diagnosis not present

## 2019-06-30 LAB — HM DIABETES EYE EXAM

## 2019-07-07 ENCOUNTER — Telehealth: Payer: Self-pay

## 2019-07-07 ENCOUNTER — Ambulatory Visit: Payer: Self-pay | Admitting: Pharmacist

## 2019-07-07 NOTE — Chronic Care Management (AMB) (Signed)
  Chronic Care Management   Note  07/07/2019 Name: Ryan Kirby MRN: [id number] DOB: 21-Feb-1941  Ryan Kirby is a 78 y.o. year old male who is a primary care patient of [name], [name], MD. The CCM team was consulted for assistance with chronic disease management and care coordination needs.    Attempted to contact patient to discuss CCM team and medication management needs; left HIPAA compliant message for patient to return my call at his convenience.   Follow up plan: - If I do not hear back, will outreach patient again in the next 4-6 weeks  Catie Darnelle Maffucci, PharmD Clinical Pharmacist [place] [phone]

## 2019-07-27 ENCOUNTER — Other Ambulatory Visit: Payer: Self-pay | Admitting: Family Medicine

## 2019-07-27 ENCOUNTER — Other Ambulatory Visit: Payer: Self-pay

## 2019-07-27 ENCOUNTER — Ambulatory Visit (INDEPENDENT_AMBULATORY_CARE_PROVIDER_SITE_OTHER): Payer: Medicare HMO

## 2019-07-27 DIAGNOSIS — Z23 Encounter for immunization: Secondary | ICD-10-CM

## 2019-07-28 ENCOUNTER — Telehealth: Payer: Self-pay | Admitting: Family Medicine

## 2019-07-28 NOTE — Chronic Care Management (AMB) (Signed)
Chronic Care Management   Note  07/28/2019 Name: JONOVAN BOEDECKER MRN: [id number] DOB: [birth year]  RISHIKESH KHACHATRYAN is a 78 y.o. year old male who is a primary care patient of [name], [name], MD. I reached out to [name] by phone today in response to a referral sent by Mr. Mariusz A Ries's health plan.     Mr. Buffin was given information about Chronic Care Management services today including:  1. CCM service includes personalized support from designated clinical staff supervised by his physician, including individualized plan of care and coordination with other care providers 2. 24/7 contact phone numbers for assistance for urgent and routine care needs. 3. Service will only be billed when office clinical staff spend 20 minutes or more in a month to coordinate care. 4. Only one practitioner may furnish and bill the service in a calendar month. 5. The patient may stop CCM services at any time (effective at the end of the month) by phone call to the office staff. 6. The patient will be responsible for cost sharing (co-pay) of up to 20% of the service fee (after annual deductible is met).  Patient agreed to services and verbal consent obtained.   Follow up plan: Telephone appointment with CCM team member scheduled for: 08/05/2019  [place]  [place], [place] [address] Direct Dial: [email or web address]  Website: [email or web address]

## 2019-08-05 ENCOUNTER — Ambulatory Visit: Payer: Medicare HMO | Admitting: Pharmacist

## 2019-08-05 DIAGNOSIS — I2510 Atherosclerotic heart disease of native coronary artery without angina pectoris: Secondary | ICD-10-CM

## 2019-08-05 DIAGNOSIS — E1122 Type 2 diabetes mellitus with diabetic chronic kidney disease: Secondary | ICD-10-CM

## 2019-08-05 DIAGNOSIS — N183 Chronic kidney disease, stage 3 unspecified: Secondary | ICD-10-CM

## 2019-08-05 NOTE — Chronic Care Management (AMB) (Signed)
Chronic Care Management   Note  08/05/2019 Name: Ryan Kirby MRN: [id number] DOB: [birth year]   Subjective:  Ryan Kirby is a 78 y.o. year old male who is a primary care patient of [name], [name], MD. The CCM team was consulted for assistance with chronic disease management and care coordination needs.    Contacted patient today for medication management review.    Review of patient status, including review of consultants reports, laboratory and other test data, was performed as part of comprehensive evaluation and provision of chronic care management services.   Objective:  Lab Results  Component Value Date   CREATININE 1.24 05/29/2019   CREATININE 1.19 02/11/2019   CREATININE 1.17 07/02/2018    Lab Results  Component Value Date   HGBA1C 7.3 (H) 05/29/2019       Component Value Date/Time   CHOL 146 02/11/2019 0944   CHOL 143 07/02/2018 0833   TRIG 162 (H) 02/11/2019 0944   TRIG 124 07/02/2018 0833   HDL 44 02/11/2019 0944   CHOLHDL 2.7 12/24/2017 0814   VLDL 25 07/02/2018 0833   LDLCALC 70 02/11/2019 0944    Clinical ASCVD: No  The 10-year ASCVD risk score Mikey Bussing DC Jr., et al., 2013) is: 56.7%   Values used to calculate the score:     Age: 50 years     Sex: Male     Is Non-Hispanic African American: No     Diabetic: Yes     Tobacco smoker: No     Systolic Blood Pressure: [id number] mmHg     Is BP treated: Yes     HDL Cholesterol: 44 mg/dL     Total Cholesterol: 146 mg/dL    BP Readings from Last 3 Encounters:  05/29/19 133/75  02/11/19 129/68  02/11/19 129/68    Allergies  Allergen Reactions  . Oxycontin [Oxycodone] Nausea And Vomiting    Medications Reviewed Today    Reviewed by De Hollingshead, [facility] (Pharmacist) on 08/05/19 at 1511  Med List Status: <None>  Medication Order Taking? Sig Documenting Provider Last Dose Status Informant  amLODipine (NORVASC) 10 MG tablet [id number] Yes Take 1 tablet (10 mg total) by mouth daily. Guadalupe Maple, MD  Taking Active   benazepril-hydrochlorthiazide (LOTENSIN HCT) 20-12.5 MG tablet [id number] Yes Take 1 tablet by mouth daily. Guadalupe Maple, MD Taking Active   carvedilol (COREG) 25 MG tablet [id number] Yes Take 2 tablets (50 mg total) by mouth 2 (two) times daily. Guadalupe Maple, MD Taking Active   cloNIDine (CATAPRES) 0.3 MG tablet [id number] Yes Take 1 tablet (0.3 mg total) by mouth 2 (two) times daily. Guadalupe Maple, MD Taking Active   docusate sodium (COLACE) 100 MG capsule [id number] No Take 1 capsule (100 mg total) by mouth 2 (two) times daily.  Patient not taking: Reported on 08/05/2019   Earleen Newport, MD Not Taking Active   doxazosin (CARDURA) 2 MG tablet [id number] Yes Take 1 tablet (2 mg total) by mouth every morning. Guadalupe Maple, MD Taking Active   esomeprazole (NEXIUM) 20 MG capsule [id number] No Take 20 mg by mouth daily. Reported on 09/12/2015 [provider] Not Taking Active Self           Med Note Helene Kelp Dec 01, 2015  8:13 AM) Sometimes takes Nexium sometimes Prilosec  furosemide (LASIX) 40 MG tablet [id number] Yes Take 1 tablet (40 mg total) by mouth daily. Guadalupe Maple, MD Taking Active  glipiZIDE (GLUCOTROL) 5 MG tablet [id number] Yes Take 1 tablet (5 mg total) by mouth daily before breakfast. Guadalupe Maple, MD Taking Active   JARDIANCE 10 MG TABS tablet [id number] Yes TAKE 1 TABLET BY MOUTH ONCE DAILY Volney American, PA-C Taking Active   metFORMIN (GLUCOPHAGE) 500 MG tablet [id number] Yes Take 1 tablet (500 mg total) by mouth 2 (two) times daily. Guadalupe Maple, MD Taking Active   potassium chloride SA (K-DUR) 20 MEQ tablet [id number] Yes Take 1 tablet (20 mEq total) by mouth daily. Guadalupe Maple, MD Taking Active   pravastatin (PRAVACHOL) 40 MG tablet [id number] Yes Take 1 tablet (40 mg total) by mouth at bedtime. Guadalupe Maple, MD Taking Active   senna (SENOKOT) 8.6 MG TABS tablet [id number] No Take 2 tablets (17.2 mg total)  by mouth at bedtime as needed for mild constipation.  Patient not taking: Reported on 02/11/2019   Earleen Newport, MD Not Taking Active            Assessment:   Goals Addressed            This Visit's Progress     Patient Stated   . PharmD "I Kirby't afford my Jardiance" (pt-stated)       Current Barriers:  . Diabetes: uncontrolled, most recent A1c 7.3%  o Patient denies using a pill box, but understands his medications and indications well . Current antihyperglycemic regimen: metformin 500 mg BID, glipizide 5 mg QAM, Jardiance 10 mg daily - notes that he is having a difficult time affording the $45 copay for this medication . Cardiovascular risk reduction: o Current hypertensive regimen: amlodipine 10 mg QAM, benazepril/HCTZ 20/12.5 mg QAM, carvedilol 25 mg BID, clonidine 0.3 mg BID, doxazosin 2 mg QAM, furosemide 40 mg QAM o Current hyperlipidemia regimen: pravastatin 40 mg daily; last LDL right at goal of 70; o Patient is NOT taking ASA 81 mg daily, though he has a hx of aortic atherosclerosis and 10 year ASCVD risk 56.7%  Pharmacist Clinical Goal(s):  Marland Kitchen Over the next 90 days, patient will work with PharmD and primary care provider to address optimized medication management  Interventions: . Comprehensive medication review performed, medication list updated in electronic medical record . Recommend addition of ASA 81 mg daily for primary prevention in this high risk patient.  . Discussed eligibility for Jardiance assistance through [facility]. Patient appears to qualify. Will collaborate w/ Merrie Roof, PA, patient, and Susy Frizzle, CPhT for patient assistance application. Patient will bring income information to his appointment next week and will sign application. Will collaborate w/ Merrie Roof, PA for her signature. Once all parts received, will submit to [facility] for assistance . Moving forward, will plan to collaborate w/ RN CM to provide education on self-monitoring and management of  DM and HTN.  Patient Self Care Activities:  . Patient will check blood glucose daily , document, and provide at future appointments . Patient will take medications as prescribed . Patient will report any questions or concerns to provider   Initial goal documentation        Plan: - Will collaborate w/ patient and provider as above on medication management - Will f/u with patient in the next 5-6 weeks for continued medication management support  Catie Darnelle Maffucci, PharmD, [place] [phone]

## 2019-08-05 NOTE — Patient Instructions (Signed)
Visit Information  Goals Addressed            This Visit's Progress     Patient Stated   . PharmD "I can't afford my Jardiance" (pt-stated)       Current Barriers:  . Diabetes: uncontrolled, most recent [id number]; complicated w/ CKD, HTN, CAD o Patient denies using a pill box, but understands his medications and indications well . Current antihyperglycemic regimen: metformin 500 mg BID, glipizide 5 mg QAM, Jardiance 10 mg daily - notes that he is having a difficult time affording the $45 copay for this medication . Cardiovascular risk reduction: o Current hypertensive regimen: amlodipine 10 mg QAM, benazepril/HCTZ 20/12.5 mg QAM, carvedilol 25 mg BID, clonidine 0.3 mg BID, doxazosin 2 mg QAM, furosemide 40 mg QAM o Current hyperlipidemia regimen: pravastatin 40 mg daily; last LDL right at goal of 70; o Patient is NOT taking ASA 81 mg daily, though he has a hx of aortic atherosclerosis and 10 year ASCVD risk 56.7%  Pharmacist Clinical Goal(s):  Marland Kitchen Over the next 90 days, patient will work with PharmD and primary care provider to address optimized medication management  Interventions: . Comprehensive medication review performed, medication list updated in electronic medical record . Recommend addition of ASA 81 mg daily for primary prevention in this high risk patient.  . Discussed eligibility for Jardiance assistance through [place]. Patient appears to qualify. Will collaborate w/ Merrie Roof, PA, patient, and Susy Frizzle, CPhT for patient assistance application. Patient will bring income information to his appointment next week and will sign application. Will collaborate w/ Merrie Roof, PA for her signature. Once all parts received, will submit to [facility] for assistance . Moving forward, will plan to collaborate w/ RN CM to provide education on self-monitoring and management of DM and HTN.  Patient Self Care Activities:  . Patient will check blood glucose daily , document, and provide at future  appointments . Patient will take medications as prescribed . Patient will report any questions or concerns to provider   Initial goal documentation        The patient verbalized understanding of instructions provided today and declined a print copy of patient instruction materials.   Plan: - Will collaborate w/ patient and provider as above on medication management - Will f/u with patient in the next 5-6 weeks for continued medication management support  Catie Darnelle Maffucci, PharmD, [place] [phone]

## 2019-08-11 ENCOUNTER — Ambulatory Visit: Payer: Self-pay | Admitting: *Deleted

## 2019-08-11 ENCOUNTER — Telehealth: Payer: Medicare HMO

## 2019-08-11 NOTE — Chronic Care Management (AMB) (Signed)
  Chronic Care Management   Outreach Note  08/11/2019 Name: Ryan Kirby MRN: [id number] DOB: 1940-12-01  Referred by: Guadalupe Maple, MD Reason for referral : Chronic Care Management (UNsuccessful Outreach )   An unsuccessful telephone outreach was attempted today. The patient was referred to the case management team by for assistance with care management and care coordination.   Follow Up Plan: A HIPPA compliant phone message was left for the patient providing contact information and requesting a return call.  The care management team will reach out to the patient again over the next 60 days.   Merlene Morse Sarha Bartelt RN, BSN Nurse Case Editor, [facility]ing [facility]  [phone]) Business Mobile

## 2019-08-13 ENCOUNTER — Other Ambulatory Visit: Payer: Self-pay

## 2019-08-13 ENCOUNTER — Ambulatory Visit (INDEPENDENT_AMBULATORY_CARE_PROVIDER_SITE_OTHER): Payer: Medicare HMO | Admitting: Family Medicine

## 2019-08-13 ENCOUNTER — Encounter: Payer: Self-pay | Admitting: Family Medicine

## 2019-08-13 VITALS — BP 134/68 | HR 64 | Temp 97.9°F | Ht 69.5 in | Wt 186.0 lb

## 2019-08-13 DIAGNOSIS — N183 Chronic kidney disease, stage 3 unspecified: Secondary | ICD-10-CM | POA: Diagnosis not present

## 2019-08-13 DIAGNOSIS — I2583 Coronary atherosclerosis due to lipid rich plaque: Secondary | ICD-10-CM | POA: Diagnosis not present

## 2019-08-13 DIAGNOSIS — E1122 Type 2 diabetes mellitus with diabetic chronic kidney disease: Secondary | ICD-10-CM

## 2019-08-13 DIAGNOSIS — I2510 Atherosclerotic heart disease of native coronary artery without angina pectoris: Secondary | ICD-10-CM

## 2019-08-13 DIAGNOSIS — K219 Gastro-esophageal reflux disease without esophagitis: Secondary | ICD-10-CM

## 2019-08-13 DIAGNOSIS — I129 Hypertensive chronic kidney disease with stage 1 through stage 4 chronic kidney disease, or unspecified chronic kidney disease: Secondary | ICD-10-CM

## 2019-08-13 DIAGNOSIS — E785 Hyperlipidemia, unspecified: Secondary | ICD-10-CM | POA: Diagnosis not present

## 2019-08-13 DIAGNOSIS — E1169 Type 2 diabetes mellitus with other specified complication: Secondary | ICD-10-CM

## 2019-08-13 LAB — UA/M W/RFLX CULTURE, ROUTINE
Bilirubin, UA: NEGATIVE
Ketones, UA: NEGATIVE
Leukocytes,UA: NEGATIVE
Nitrite, UA: NEGATIVE
Protein,UA: NEGATIVE
RBC, UA: NEGATIVE
Specific Gravity, UA: 1.01 (ref 1.005–1.030)
Urobilinogen, Ur: 0.2 mg/dL (ref 0.2–1.0)
pH, UA: 6.5 (ref 5.0–7.5)

## 2019-08-13 NOTE — Progress Notes (Addendum)
BP 134/68   Pulse 64   Temp 97.9 F (36.6 C) (Oral)   Ht 5' 9.5" (1.765 m)   Wt 186 lb (84.4 kg)   SpO2 100%   BMI 27.07 kg/m    Subjective:    Patient ID: Ryan Kirby, male    DOB: 02/07/1941, 78 y.o.   MRN: [id number]  HPI: Ryan Kirby is a 78 y.o. male  Chief Complaint  Patient presents with  . Hypertension  . Hyperlipidemia  . Diabetes   Here today for 6 month f/u chronic conditions.   Balance issues, falling every now and then the past year or so. Feels his neuropathy is a major contributor to this but also just doesn't feel as strong as he used to. No injuries with these falls, and none have been recently.   HTN - does not check BPs at home. Taking his medicines faithfully without side effects. Denies CP, SOB, HAs, dizziness. Trying to eat well, stays active but does not exercise formally.   DM - Does not check home BSs. Takes medicines faithfully. No side effects or low blood sugar spells.   CAD/HLD - on pravastatin, no anginal episodes, myalgias, claudication.   GERD - getting good relief with nexium daily. Minimal breakthrough sxs noted.   Relevant past medical, surgical, family and social history reviewed and updated as indicated. Interim medical history since our last visit reviewed. Allergies and medications reviewed and updated.  Review of Systems  Per HPI unless specifically indicated above     Objective:    BP 134/68   Pulse 64   Temp 97.9 F (36.6 C) (Oral)   Ht 5' 9.5" (1.765 m)   Wt 186 lb (84.4 kg)   SpO2 100%   BMI 27.07 kg/m   Wt Readings from Last 3 Encounters:  08/13/19 186 lb (84.4 kg)  02/11/19 182 lb (82.6 kg)  02/11/19 182 lb (82.6 kg)    Physical Exam Vitals and nursing note reviewed.  Constitutional:      Appearance: Normal appearance.  HENT:     Head: Atraumatic.  Eyes:     Extraocular Movements: Extraocular movements intact.     Conjunctiva/sclera: Conjunctivae normal.  Cardiovascular:     Rate and Rhythm:  Normal rate and regular rhythm.  Pulmonary:     Effort: Pulmonary effort is normal.     Breath sounds: Normal breath sounds.  Abdominal:     General: Bowel sounds are normal.     Palpations: Abdomen is soft.     Tenderness: There is no abdominal tenderness.  Musculoskeletal:        General: Normal range of motion.     Cervical back: Normal range of motion and neck supple.  Skin:    General: Skin is warm and dry.  Neurological:     General: No focal deficit present.     Mental Status: He is oriented to person, place, and time.     Gait: Gait normal.  Psychiatric:        Mood and Affect: Mood normal.        Thought Content: Thought content normal.        Judgment: Judgment normal.     Results for orders placed or performed in visit on 08/13/19  Comprehensive metabolic panel  Result Value Ref Range   Glucose 217 (H) 65 - 99 mg/dL   BUN 19 8 - 27 mg/dL   Creatinine, Ser 1.30 (H) 0.76 - 1.27 mg/dL   GFR  calc non Af Amer 53 (L) >59 mL/min/1.73   GFR calc Af Amer 61 >59 mL/min/1.73   BUN/Creatinine Ratio 15 10 - 24   Sodium 139 134 - 144 mmol/L   Potassium 4.1 3.5 - 5.2 mmol/L   Chloride 97 96 - 106 mmol/L   CO2 27 20 - 29 mmol/L   Calcium 10.9 (H) 8.6 - 10.2 mg/dL   Total Protein 6.8 6.0 - 8.5 g/dL   Albumin 4.7 3.7 - 4.7 g/dL   Globulin, Total 2.1 1.5 - 4.5 g/dL   Albumin/Globulin Ratio 2.2 1.2 - 2.2   Bilirubin Total 0.5 0.0 - 1.2 mg/dL   Alkaline Phosphatase 113 39 - 117 IU/L   AST 21 0 - 40 IU/L   ALT 11 0 - 44 IU/L  Lipid Panel w/o Chol/HDL Ratio out  Result Value Ref Range   Cholesterol, Total 152 100 - 199 mg/dL   Triglycerides 192 (H) 0 - 149 mg/dL   HDL 49 >39 mg/dL   VLDL Cholesterol Cal 32 5 - 40 mg/dL   LDL Chol Calc (NIH) 71 0 - 99 mg/dL  UA/M w/rflx Culture, Routine   Specimen: Urine   URINE  Result Value Ref Range   Specific Gravity, UA 1.010 1.005 - 1.030   pH, UA 6.5 5.0 - 7.5   Color, UA Yellow Yellow   Appearance Ur Clear Clear   Leukocytes,UA  Negative Negative   Protein,UA Negative Negative/Trace   Glucose, UA 3+ (A) Negative   Ketones, UA Negative Negative   RBC, UA Negative Negative   Bilirubin, UA Negative Negative   Urobilinogen, Ur 0.2 0.2 - 1.0 mg/dL   Nitrite, UA Negative Negative  HgB A1c  Result Value Ref Range   Hgb A1c MFr Bld 7.3 (H) 4.8 - 5.6 %   Est. average glucose Bld gHb Est-mCnc 163 mg/dL      Assessment & Plan:   Problem List Items Addressed This Visit      Cardiovascular and Mediastinum   Parenchymal renal hypertension    BPs stable and WNL, continue current regimen      Relevant Orders   Comprehensive metabolic panel (Completed)   UA/M w/rflx Culture, Routine (Completed)   CAD (coronary artery disease) - Primary    Recheck lipids, continue current regimen      Relevant Orders   Lipid Panel w/o Chol/HDL Ratio out (Completed)     Digestive   GERD (gastroesophageal reflux disease)    Stable on nexium, continue current regimen        Endocrine   Hyperlipidemia associated with type 2 diabetes mellitus (HCC)    Recheck lipids, adjust as needed. Continue current regimen      Relevant Orders   Lipid Panel w/o Chol/HDL Ratio out (Completed)   DM type 2 causing CKD stage 3 (HCC)    Recheck A1C, adjust as needed. Continue good lifestyle habits and current medications      Relevant Orders   HgB A1c (Completed)       Follow up plan: Return in about 6 months (around 02/11/2020) for CPE.

## 2019-08-14 ENCOUNTER — Ambulatory Visit: Payer: Self-pay | Admitting: Pharmacist

## 2019-08-14 LAB — COMPREHENSIVE METABOLIC PANEL
ALT: 11 IU/L (ref 0–44)
AST: 21 IU/L (ref 0–40)
Albumin/Globulin Ratio: 2.2 (ref 1.2–2.2)
Albumin: 4.7 g/dL (ref 3.7–4.7)
Alkaline Phosphatase: 113 IU/L (ref 39–117)
BUN/Creatinine Ratio: 15 (ref 10–24)
BUN: 19 mg/dL (ref 8–27)
Bilirubin Total: 0.5 mg/dL (ref 0.0–1.2)
CO2: 27 mmol/L (ref 20–29)
Calcium: 10.9 mg/dL — ABNORMAL HIGH (ref 8.6–10.2)
Chloride: 97 mmol/L (ref 96–106)
Creatinine, Ser: 1.3 mg/dL — ABNORMAL HIGH (ref 0.76–1.27)
GFR calc Af Amer: 61 mL/min/{1.73_m2} (ref >59)
GFR calc non Af Amer: 53 mL/min/{1.73_m2} — ABNORMAL LOW (ref >59)
Globulin, Total: 2.1 g/dL (ref 1.5–4.5)
Glucose: 217 mg/dL — ABNORMAL HIGH (ref 65–99)
Potassium: 4.1 mmol/L (ref 3.5–5.2)
Sodium: 139 mmol/L (ref 134–144)
Total Protein: 6.8 g/dL (ref 6.0–8.5)

## 2019-08-14 LAB — HEMOGLOBIN A1C
Est. average glucose Bld gHb Est-mCnc: 163 mg/dL
Hgb A1c MFr Bld: 7.3 % — ABNORMAL HIGH (ref 4.8–5.6)

## 2019-08-14 LAB — LIPID PANEL W/O CHOL/HDL RATIO
Cholesterol, Total: 152 mg/dL (ref 100–199)
HDL: 49 mg/dL (ref >39)
LDL Chol Calc (NIH): 71 mg/dL (ref 0–99)
Triglycerides: 192 mg/dL — ABNORMAL HIGH (ref 0–149)
VLDL Cholesterol Cal: 32 mg/dL (ref 5–40)

## 2019-08-14 NOTE — Chronic Care Management (AMB) (Signed)
  Chronic Care Management   Note  08/14/2019 Name: Ryan Kirby MRN: [id number] DOB: [birth year]  Ryan Kirby is a 79 y.o. year old male who is a primary care patient of [name], [name], MD. The CCM team was consulted for assistance with chronic disease management and care coordination needs.    Office staff received patient's income information when he arrived for his appointment, however, he did not sign patient portion of application for Jardiance assistance through [place].   Called patient; left message letting him know the above and requesting he come back by the office to sign the application, at his convenience.   Plan: - Will f/u with patient and collaborate w/ CPhT on application.   Catie Darnelle Maffucci, PharmD, [place] [phone]

## 2019-08-17 NOTE — Assessment & Plan Note (Signed)
BPs stable and WNL, continue current regimen 

## 2019-08-17 NOTE — Assessment & Plan Note (Signed)
Recheck A1C, adjust as needed. Continue good lifestyle habits and current medications

## 2019-08-17 NOTE — Assessment & Plan Note (Signed)
Recheck lipids, continue current regimen 

## 2019-08-17 NOTE — Assessment & Plan Note (Signed)
Stable on nexium, continue current regimen

## 2019-08-17 NOTE — Assessment & Plan Note (Signed)
Recheck lipids, adjust as needed. Continue current regimen 

## 2019-08-18 ENCOUNTER — Ambulatory Visit: Payer: Self-pay | Admitting: Pharmacist

## 2019-08-18 DIAGNOSIS — E1122 Type 2 diabetes mellitus with diabetic chronic kidney disease: Secondary | ICD-10-CM

## 2019-08-18 DIAGNOSIS — I2510 Atherosclerotic heart disease of native coronary artery without angina pectoris: Secondary | ICD-10-CM

## 2019-08-18 DIAGNOSIS — N183 Chronic kidney disease, stage 3 unspecified: Secondary | ICD-10-CM

## 2019-08-18 NOTE — Patient Instructions (Signed)
Visit Information  Goals Addressed            This Visit's Progress     Patient Stated   . PharmD "I can't afford my Jardiance" (pt-stated)       Current Barriers:  . Diabetes: uncontrolled, most recent 123456 [id number]; complicated w/ CKD, HTN, CAD . Current antihyperglycemic regimen: metformin 500 mg BID, glipizide 5 mg QAM, Jardiance 10 mg daily o Collaboratively decided to apply for Jardiance assistance through [place] . Cardiovascular risk reduction: o Current hypertensive regimen: amlodipine 10 mg QAM, benazepril/HCTZ 20/12.5 mg QAM, carvedilol 25 mg BID, clonidine 0.3 mg BID, doxazosin 2 mg QAM, furosemide 40 mg QAM o Current hyperlipidemia regimen: pravastatin 40 mg daily; last LDL right at goal of 70; o Patient is NOT taking ASA 81 mg daily, though he has a hx of aortic atherosclerosis and 10 year ASCVD risk 56.7%  Pharmacist Clinical Goal(s):  Marland Kitchen Over the next 90 days, patient will work with PharmD and primary care provider to address optimized medication management  Interventions: . Received patient and provider portions of Jardiance application. However, will collaborate with Merrie Roof, PA to see if she would like to increase Jardiance to 25 mg daily d/t uncontrolled A1c.  Marland Kitchen Also recommend considering addition of ASA 81 mg daily   Patient Self Care Activities:  . Patient will check blood glucose daily , document, and provide at future appointments . Patient will take medications as prescribed . Patient will report any questions or concerns to provider   Please see past updates related to this goal by clicking on the "Past Updates" button in the selected goal         The patient verbalized understanding of instructions provided today and declined a print copy of patient instruction materials.   Plan:  - Will await dosing decision from Merrie Roof, [place] before submitting [place], PharmD, [place] [phone]

## 2019-08-18 NOTE — Chronic Care Management (AMB) (Signed)
Chronic Care Management   Follow Up Note   08/18/2019 Name: STOKES NASTRI MRN: [id number] DOB: 1941/05/20  Referred by: Guadalupe Maple, MD Reason for referral : Chronic Care Management (Medication Management)   CHANG DONALD is a 78 y.o. year old male who is a primary care patient of [name], [name], MD. The CCM team was consulted for assistance with chronic disease management and care coordination needs.    Care coordination completed today.   Review of patient status, including review of consultants reports, relevant laboratory and other test results, and collaboration with appropriate care team members and the patient's provider was performed as part of comprehensive patient evaluation and provision of chronic care management services.    SDOH (Social Determinants of Health) screening performed today: Financial Strain . See Care Plan for related entries.   Outpatient Encounter Medications as of 08/18/2019  Medication Sig Note  . amLODipine (NORVASC) 10 MG tablet Take 1 tablet (10 mg total) by mouth daily.   . benazepril-hydrochlorthiazide (LOTENSIN HCT) 20-12.5 MG tablet Take 1 tablet by mouth daily.   . carvedilol (COREG) 25 MG tablet Take 2 tablets (50 mg total) by mouth 2 (two) times daily.   . cetirizine (ZYRTEC) 10 MG tablet Take 10 mg by mouth daily. 08/13/2019: As needed  . cloNIDine (CATAPRES) 0.3 MG tablet Take 1 tablet (0.3 mg total) by mouth 2 (two) times daily.   Marland Kitchen docusate sodium (COLACE) 100 MG capsule Take 1 capsule (100 mg total) by mouth 2 (two) times daily. 08/13/2019: As needed  . doxazosin (CARDURA) 2 MG tablet Take 1 tablet (2 mg total) by mouth every morning.   Marland Kitchen esomeprazole (NEXIUM) 20 MG capsule Take 20 mg by mouth daily. Reported on 09/12/2015 12/01/2015: Sometimes takes Nexium sometimes Prilosec  . furosemide (LASIX) 40 MG tablet Take 1 tablet (40 mg total) by mouth daily.   Marland Kitchen glipiZIDE (GLUCOTROL) 5 MG tablet Take 1 tablet (5 mg total) by mouth daily  before breakfast.   . JARDIANCE 10 MG TABS tablet TAKE 1 TABLET BY MOUTH ONCE DAILY   . metFORMIN (GLUCOPHAGE) 500 MG tablet Take 1 tablet (500 mg total) by mouth 2 (two) times daily.   . potassium chloride SA (K-DUR) 20 MEQ tablet Take 1 tablet (20 mEq total) by mouth daily.   . pravastatin (PRAVACHOL) 40 MG tablet Take 1 tablet (40 mg total) by mouth at bedtime.   . senna (SENOKOT) 8.6 MG TABS tablet Take 2 tablets (17.2 mg total) by mouth at bedtime as needed for mild constipation.    No facility-administered encounter medications on file as of 08/18/2019.     Goals Addressed            This Visit's Progress     Patient Stated   . PharmD "I can't afford my Jardiance" (pt-stated)       Current Barriers:  . Diabetes: uncontrolled, most recent 123456 [id number]; complicated w/ CKD, HTN, CAD . Current antihyperglycemic regimen: metformin 500 mg BID, glipizide 5 mg QAM, Jardiance 10 mg daily o Collaboratively decided to apply for Jardiance assistance through [place] . Cardiovascular risk reduction: o Current hypertensive regimen: amlodipine 10 mg QAM, benazepril/HCTZ 20/12.5 mg QAM, carvedilol 25 mg BID, clonidine 0.3 mg BID, doxazosin 2 mg QAM, furosemide 40 mg QAM o Current hyperlipidemia regimen: pravastatin 40 mg daily; last LDL right at goal of 70; o Patient is NOT taking ASA 81 mg daily, though he has a hx of aortic atherosclerosis and 10 year  ASCVD risk 56.7%  Pharmacist Clinical Goal(s):  Marland Kitchen Over the next 90 days, patient will work with PharmD and primary care provider to address optimized medication management  Interventions: . Received patient and provider portions of Jardiance application. However, will collaborate with Merrie Roof, PA to see if she would like to increase Jardiance to 25 mg daily d/t uncontrolled A1c.  Marland Kitchen Also recommend considering addition of ASA 81 mg daily   Patient Self Care Activities:  . Patient will check blood glucose daily , document, and provide at future  appointments . Patient will take medications as prescribed . Patient will report any questions or concerns to provider   Please see past updates related to this goal by clicking on the "Past Updates" button in the selected goal          Plan:  - Will await dosing decision from Merrie Roof, [place] before submitting [place] application  Catie Darnelle Maffucci, PharmD, [place] [phone]

## 2019-08-24 ENCOUNTER — Telehealth: Payer: Self-pay | Admitting: Family Medicine

## 2019-08-24 ENCOUNTER — Ambulatory Visit (INDEPENDENT_AMBULATORY_CARE_PROVIDER_SITE_OTHER): Payer: Medicare HMO | Admitting: Pharmacist

## 2019-08-24 DIAGNOSIS — N183 Chronic kidney disease, stage 3 unspecified: Secondary | ICD-10-CM | POA: Diagnosis not present

## 2019-08-24 DIAGNOSIS — E1122 Type 2 diabetes mellitus with diabetic chronic kidney disease: Secondary | ICD-10-CM

## 2019-08-24 MED ORDER — JARDIANCE 25 MG PO TABS: 25.0000 mg | ORAL_TABLET | Freq: Every day | ORAL | 1 refills | Status: DC

## 2019-08-24 NOTE — Patient Instructions (Signed)
Visit Information  Goals Addressed            This Visit's Progress     Patient Stated   . PharmD "I can't afford my Jardiance" (pt-stated)       Current Barriers:  . Diabetes: uncontrolled, most recent 123456 [id number]; complicated w/ CKD, HTN, CAD . Current antihyperglycemic regimen: metformin 500 mg BID, glipizide 5 mg QAM, Jardiance 10 mg daily o Collaboratively decided to apply for Jardiance assistance through [place] . Cardiovascular risk reduction: o Current hypertensive regimen: amlodipine 10 mg QAM, benazepril/HCTZ 20/12.5 mg QAM, carvedilol 25 mg BID, clonidine 0.3 mg BID, doxazosin 2 mg QAM, furosemide 40 mg QAM o Current hyperlipidemia regimen: pravastatin 40 mg daily; last LDL right at goal of 70; o Patient is NOT taking ASA 81 mg daily, though he has a hx of aortic atherosclerosis and 10 year ASCVD risk 56.7%  Pharmacist Clinical Goal(s):  Marland Kitchen Over the next 90 days, patient will work with PharmD and primary care provider to address optimized medication management  Interventions: . Received patient and provider portions of Jardiance application. Merrie Roof, PA agreed to dose increase. Submitted application to [employer]. Will collaborate w/ Susy Frizzle, CPhT for follow up.   Patient Self Care Activities:  . Patient will check blood glucose daily , document, and provide at future appointments . Patient will take medications as prescribed . Patient will report any questions or concerns to provider   Please see past updates related to this goal by clicking on the "Past Updates" button in the selected goal         The patient verbalized understanding of instructions provided today and declined a print copy of patient instruction materials.   Plan: - Will collaborate w/ Susy Frizzle, CPhT on medication access follow up as above  Catie Darnelle Maffucci, PharmD, [place] [phone]

## 2019-08-24 NOTE — Telephone Encounter (Signed)
Spoke with CCM Pharmacist who recommended increase in Jardiance to 25 mg. New dose sent in. Also recommended addition of baby aspirin for vascular protection

## 2019-08-24 NOTE — Chronic Care Management (AMB) (Signed)
Chronic Care Management   Follow Up Note   08/24/2019 Name: BLACE LEPRE MRN: [id number] DOB: 1941/08/26  Referred by: Guadalupe Maple, MD Reason for referral : Chronic Care Management (Medication Management)   ABASI GAIDA is a 78 y.o. year old male who is a primary care patient of [name], [name], MD. The CCM team was consulted for assistance with chronic disease management and care coordination needs.    Care coordination completed today.   Review of patient status, including review of consultants reports, relevant laboratory and other test results, and collaboration with appropriate care team members and the patient's provider was performed as part of comprehensive patient evaluation and provision of chronic care management services.    SDOH (Social Determinants of Health) screening performed today: Financial Strain . See Care Plan for related entries.   Outpatient Encounter Medications as of 08/24/2019  Medication Sig Note  . amLODipine (NORVASC) 10 MG tablet Take 1 tablet (10 mg total) by mouth daily.   . benazepril-hydrochlorthiazide (LOTENSIN HCT) 20-12.5 MG tablet Take 1 tablet by mouth daily.   . carvedilol (COREG) 25 MG tablet Take 2 tablets (50 mg total) by mouth 2 (two) times daily.   . cetirizine (ZYRTEC) 10 MG tablet Take 10 mg by mouth daily. 08/13/2019: As needed  . cloNIDine (CATAPRES) 0.3 MG tablet Take 1 tablet (0.3 mg total) by mouth 2 (two) times daily.   Marland Kitchen docusate sodium (COLACE) 100 MG capsule Take 1 capsule (100 mg total) by mouth 2 (two) times daily. 08/13/2019: As needed  . doxazosin (CARDURA) 2 MG tablet Take 1 tablet (2 mg total) by mouth every morning.   . empagliflozin (JARDIANCE) 25 MG TABS tablet Take 25 mg by mouth daily before breakfast.   . esomeprazole (NEXIUM) 20 MG capsule Take 20 mg by mouth daily. Reported on 09/12/2015 12/01/2015: Sometimes takes Nexium sometimes Prilosec  . furosemide (LASIX) 40 MG tablet Take 1 tablet (40 mg total) by  mouth daily.   Marland Kitchen glipiZIDE (GLUCOTROL) 5 MG tablet Take 1 tablet (5 mg total) by mouth daily before breakfast.   . metFORMIN (GLUCOPHAGE) 500 MG tablet Take 1 tablet (500 mg total) by mouth 2 (two) times daily.   . potassium chloride SA (K-DUR) 20 MEQ tablet Take 1 tablet (20 mEq total) by mouth daily.   . pravastatin (PRAVACHOL) 40 MG tablet Take 1 tablet (40 mg total) by mouth at bedtime.   . senna (SENOKOT) 8.6 MG TABS tablet Take 2 tablets (17.2 mg total) by mouth at bedtime as needed for mild constipation.    No facility-administered encounter medications on file as of 08/24/2019.     Goals Addressed            This Visit's Progress     Patient Stated   . PharmD "I can't afford my Jardiance" (pt-stated)       Current Barriers:  . Diabetes: uncontrolled, most recent 123456 [id number]; complicated w/ CKD, HTN, CAD . Current antihyperglycemic regimen: metformin 500 mg BID, glipizide 5 mg QAM, Jardiance 10 mg daily o Collaboratively decided to apply for Jardiance assistance through [place] . Cardiovascular risk reduction: o Current hypertensive regimen: amlodipine 10 mg QAM, benazepril/HCTZ 20/12.5 mg QAM, carvedilol 25 mg BID, clonidine 0.3 mg BID, doxazosin 2 mg QAM, furosemide 40 mg QAM o Current hyperlipidemia regimen: pravastatin 40 mg daily; last LDL right at goal of 70; o Patient is NOT taking ASA 81 mg daily, though he has a hx of aortic atherosclerosis and  10 year ASCVD risk 56.7%  Pharmacist Clinical Goal(s):  Marland Kitchen Over the next 90 days, patient will work with PharmD and primary care provider to address optimized medication management  Interventions: . Received patient and provider portions of Jardiance application. Merrie Roof, PA agreed to dose increase. Submitted application to [employer]. Will collaborate w/ Susy Frizzle, CPhT for follow up.   Patient Self Care Activities:  . Patient will check blood glucose daily , document, and provide at future appointments . Patient will take  medications as prescribed . Patient will report any questions or concerns to provider   Please see past updates related to this goal by clicking on the "Past Updates" button in the selected goal          Plan: - Will collaborate w/ Susy Frizzle, CPhT on medication access follow up as above  Catie Darnelle Maffucci, PharmD, [place] [phone]

## 2019-08-25 NOTE — Telephone Encounter (Signed)
Pt is returning anita call the office is closed for lunch

## 2019-08-25 NOTE — Telephone Encounter (Signed)
Patient notified

## 2019-08-25 NOTE — Telephone Encounter (Signed)
Called patient. LVM for patient to return call to the office.

## 2019-09-03 ENCOUNTER — Other Ambulatory Visit: Payer: Self-pay | Admitting: Pharmacy Technician

## 2019-09-03 NOTE — Patient Outreach (Signed)
[facility] [facility]) Care Management  09/03/2019  Ryan Kirby 09-Jan-1941 [id number]   Care coordination placed to [facility] in regards to patient's application for Jardiance.  Spoke to [place] who informed patient has been APPROVED 12/23/202-09/02/2020. Mel Almond informed the medication shipped on 08/31/2019 for a 90 days supply with [employer], final delivery by [employer]. The tracking number is [id number]. It is set to be delivered to the patient on 09/08/2019.  Will followup with patient in 5-10 business days to inquire on status of medication delivery.  [name] P. Geffrey Michaelsen, [place] Management [phone]

## 2019-09-09 ENCOUNTER — Telehealth: Payer: Self-pay

## 2019-09-09 ENCOUNTER — Ambulatory Visit: Payer: Self-pay | Admitting: Pharmacist

## 2019-09-09 ENCOUNTER — Other Ambulatory Visit: Payer: Self-pay | Admitting: Pharmacy Technician

## 2019-09-09 NOTE — Chronic Care Management (AMB) (Signed)
  Chronic Care Management   Note  09/09/2019 Name: Ryan Kirby MRN: [id number] DOB: [birth year]  Ryan Kirby is a 79 y.o. year old male who is a primary care patient of [name], [name], MD. The CCM team was consulted for assistance with chronic disease management and care coordination needs.    Attempted to contact patient for medication management review. HIPPA compliant message left for patient to return my call at his convenience.   Follow up plan: - Will collaborate w/ Care Guide to schedule follow up phone call with me  Catie Darnelle Maffucci, PharmD, [place] [phone]

## 2019-09-09 NOTE — Patient Outreach (Signed)
[place] [facility]) Care Management  [place]) Care Management  09/09/2019  Ryan Kirby [birth year] [id number]    Successful call placed to patient regarding patient assistance medication delivery of Jardiance with [employer], HIPAA identifiers verified.   Patient informed he received a 90 days supply of medicaiton. Discussed refill procedure with patient which entails him to call into [facility] for his refills. Informed patient where to find that number. Patient verbalized understanding. Patient informed he has no other questions or concerns. Confirmed patient had name and number.  Follow up:  Will route note to embedded pharmacist Catie Darnelle Maffucci that patient assistance has been completed and will remove myself from care team.  [name]. [name], [place] Management [phone]

## 2019-09-10 ENCOUNTER — Telehealth: Payer: Self-pay | Admitting: Family Medicine

## 2019-09-10 NOTE — Chronic Care Management (AMB) (Signed)
  Care Management   Note  09/10/2019 Name: Ryan Kirby MRN: [id number] DOB: 04/01/1941  Ryan Kirby is a 79 y.o. year old male who is a primary care patient of [name], [name], MD and is actively engaged with the care management team. I reached out to [name] by phone today to assist with re-scheduling a follow up appointment with the Pharmacist  Follow up plan: Telephone appointment with care management team member scheduled for: 09/25/2019  [place], [place] Management  [place], [place] [address] Direct Dial: [email or web address]  [email or web address]: [email or web address]

## 2019-09-22 ENCOUNTER — Telehealth: Payer: Self-pay

## 2019-09-25 ENCOUNTER — Ambulatory Visit (INDEPENDENT_AMBULATORY_CARE_PROVIDER_SITE_OTHER): Payer: Medicare HMO | Admitting: Pharmacist

## 2019-09-25 ENCOUNTER — Telehealth: Payer: Self-pay | Admitting: Pharmacist

## 2019-09-25 DIAGNOSIS — I2583 Coronary atherosclerosis due to lipid rich plaque: Secondary | ICD-10-CM | POA: Diagnosis not present

## 2019-09-25 DIAGNOSIS — E1122 Type 2 diabetes mellitus with diabetic chronic kidney disease: Secondary | ICD-10-CM | POA: Diagnosis not present

## 2019-09-25 DIAGNOSIS — N183 Chronic kidney disease, stage 3 unspecified: Secondary | ICD-10-CM

## 2019-09-25 DIAGNOSIS — I2510 Atherosclerotic heart disease of native coronary artery without angina pectoris: Secondary | ICD-10-CM

## 2019-09-25 NOTE — Chronic Care Management (AMB) (Signed)
Chronic Care Management   Follow Up Note   09/25/2019 Name: Ryan Kirby MRN: [id number] DOB: 1940-11-24  Referred by: Ryan Maple, MD Reason for referral : Chronic Care Management (Medication Management)   Ryan Kirby is a 79 y.o. year old male who is a primary care patient of [name], Ryan How, MD. The CCM team was consulted for assistance with chronic disease management and care coordination needs.    Contacted patient for medication management review.   Review of patient status, including review of consultants reports, relevant laboratory and other test results, and collaboration with appropriate care team members and the patient's provider was performed as part of comprehensive patient evaluation and provision of chronic care management services.    SDOH (Social Determinants of Health) screening performed today: None. See Care Plan for related entries.   Outpatient Encounter Medications as of 09/25/2019  Medication Sig Note  . amLODipine (NORVASC) 10 MG tablet Take 1 tablet (10 mg total) by mouth daily.   . benazepril-hydrochlorthiazide (LOTENSIN HCT) 20-12.5 MG tablet Take 1 tablet by mouth daily.   . carvedilol (COREG) 25 MG tablet Take 2 tablets (50 mg total) by mouth 2 (two) times daily.   . cetirizine (ZYRTEC) 10 MG tablet Take 10 mg by mouth daily. 08/13/2019: As needed  . cloNIDine (CATAPRES) 0.3 MG tablet Take 1 tablet (0.3 mg total) by mouth 2 (two) times daily.   Marland Kitchen docusate sodium (COLACE) 100 MG capsule Take 1 capsule (100 mg total) by mouth 2 (two) times daily. 08/13/2019: As needed  . doxazosin (CARDURA) 2 MG tablet Take 1 tablet (2 mg total) by mouth every morning.   . empagliflozin (JARDIANCE) 25 MG TABS tablet Take 25 mg by mouth daily before breakfast.   . furosemide (LASIX) 40 MG tablet Take 1 tablet (40 mg total) by mouth daily.   Marland Kitchen glipiZIDE (GLUCOTROL) 5 MG tablet Take 1 tablet (5 mg total) by mouth daily before breakfast.   . metFORMIN (GLUCOPHAGE) 500  MG tablet Take 1 tablet (500 mg total) by mouth 2 (two) times daily.   . potassium chloride SA (K-DUR) 20 MEQ tablet Take 1 tablet (20 mEq total) by mouth daily.   . pravastatin (PRAVACHOL) 40 MG tablet Take 1 tablet (40 mg total) by mouth at bedtime.   Marland Kitchen aspirin EC 81 MG tablet Take 81 mg by mouth daily.   Marland Kitchen esomeprazole (NEXIUM) 20 MG capsule Take 20 mg by mouth daily. Reported on 09/12/2015 12/01/2015: Sometimes takes Nexium sometimes Prilosec  . senna (SENOKOT) 8.6 MG TABS tablet Take 2 tablets (17.2 mg total) by mouth at bedtime as needed for mild constipation.    No facility-administered encounter medications on file as of 09/25/2019.     Objective:   Goals Addressed            This Visit's Progress     Patient Stated   . PharmD "I can't afford my Jardiance" (pt-stated)       Current Barriers:  . Diabetes: uncontrolled, most recent 123456 [id number]; complicated w/ CKD, HTN, CAD . Current antihyperglycemic regimen: metformin 500 mg BID, glipizide 5 mg QAM, Jardiance 25 mg daily  o APPROVED for Jardiance assistance through [employer] through 09/02/2020 . Patient has not been checking blood sugars; he notes that he doesn't have a meter, and it has been "years" since he has checked blood sugars . Denies any episodes of hypoglycemia, including symptoms of dizziness, lightheadedness, sweating . Cardiovascular risk reduction: o Current hypertensive regimen:  amlodipine 10 mg QAM, benazepril/HCTZ 20/12.5 mg QAM, carvedilol 25 mg BID, clonidine 0.3 mg BID, doxazosin 2 mg QAM, furosemide 40 mg QAM o Current hyperlipidemia regimen: pravastatin 40 mg daily; last LDL right at goal of 70; o Patient has not picked up ASA 81 mg daily since previously recommended by [place]):  Marland Kitchen Over the next 90 days, patient will work with PharmD and primary care provider to address optimized medication management  Interventions: . Comprehensive medication review performed w/  patient's refill history . Recommended he start checking blood sugars to evaluate the impact of Jardiance dose increase. Will collaborate w/ clinic staff to prepare prescription for glucometer . Discussed goal A1c <7%, goal fasting glucose <130 and goal 2 hour post prandial glucose <180 . Discussed recommendation to start ASA 81 mg daily. Patient notes he will try to remember to do so the next time he is in town . Mailing patient list of BG parameters and reminder to start ASA 81 mg daily.   Patient Self Care Activities:  . Patient will check blood glucose daily , document, and provide at future appointments . Patient will take medications as prescribed . Patient will report any questions or concerns to provider   Please see past updates related to this goal by clicking on the "Past Updates" button in the selected goal          Plan:  - Scheduled f/u call 11/18/19  Catie Darnelle Maffucci, PharmD, [place] [phone]

## 2019-09-25 NOTE — Telephone Encounter (Signed)
Signed and returned to folder.

## 2019-09-25 NOTE — Patient Instructions (Addendum)
Mr. Fard,   It was great talking with you today!  I will work with Merrie Roof to have prescription for a blood sugar meter sent into [facility]. Ask the pharmacist there to demonstrate how to use the meter, if you have questions.   It would be good to keep an occasional check on your blood sugars to make sure they are improving. For our goal A1c of <7%, we are looking for fasting blood sugar readings to be less than 130, and readings 2 hours after meals to be less than 180.   Remember, start taking an aspirin 81 mg tablet daily.  Please feel free to call with any questions. I am going to reach out and call you in March to check in.   Visit Information  Goals Addressed            This Visit's Progress     Patient Stated   . PharmD "I can't afford my Jardiance" (pt-stated)       Current Barriers:  . Diabetes: uncontrolled, most recent [id number]; complicated w/ CKD, HTN, CAD . Current antihyperglycemic regimen: metformin 500 mg BID, glipizide 5 mg QAM, Jardiance 25 mg daily  o APPROVED for Jardiance assistance through [employer] through 09/02/2020 . Patient has not been checking blood sugars; he notes that he doesn't have a meter, and it has been "years" since he has checked blood sugars . Denies any episodes of hypoglycemia, including symptoms of dizziness, lightheadedness, sweating . Cardiovascular risk reduction: o Current hypertensive regimen: amlodipine 10 mg QAM, benazepril/HCTZ 20/12.5 mg QAM, carvedilol 25 mg BID, clonidine 0.3 mg BID, doxazosin 2 mg QAM, furosemide 40 mg QAM o Current hyperlipidemia regimen: pravastatin 40 mg daily; last LDL right at goal of 70; o Patient has not picked up ASA 81 mg daily since previously recommended by [place]):  Marland Kitchen Over the next 90 days, patient will work with PharmD and primary care provider to address optimized medication management  Interventions: . Comprehensive medication review performed  w/ patient's refill history . Recommended he start checking blood sugars to evaluate the impact of Jardiance dose increase. Will collaborate w/ clinic staff to prepare prescription for glucometer . Discussed goal A1c <7%, goal fasting glucose <130 and goal 2 hour post prandial glucose <180 . Discussed recommendation to start ASA 81 mg daily. Patient notes he will try to remember to do so the next time he is in town . Mailing patient list of BG parameters and reminder to start ASA 81 mg daily.   Patient Self Care Activities:  . Patient will check blood glucose daily , document, and provide at future appointments . Patient will take medications as prescribed . Patient will report any questions or concerns to provider   Please see past updates related to this goal by clicking on the "Past Updates" button in the selected goal         The patient verbalized understanding of instructions provided today and declined a print copy of patient instruction materials.   Plan:  - Scheduled f/u call 11/18/19  Catie Darnelle Maffucci, PharmD, [place] [phone]

## 2019-09-25 NOTE — Telephone Encounter (Signed)
Could we please prepare a prescription for a glucometer and fax to [facility]? Thanks!

## 2019-09-25 NOTE — Telephone Encounter (Signed)
Form filled out and placed in Ryan Kirby's folder to sign

## 2019-09-28 NOTE — Telephone Encounter (Signed)
Form faxed Friday 09/25/2019

## 2019-11-18 ENCOUNTER — Telehealth: Payer: Self-pay

## 2019-11-18 ENCOUNTER — Ambulatory Visit: Payer: Self-pay | Admitting: Pharmacist

## 2019-11-18 NOTE — Chronic Care Management (AMB) (Signed)
  Chronic Care Management   Note  11/18/2019 Name: ANDER WATRING MRN: [id number] DOB: 05/09/1941  KHASE KAPPEL is a 79 y.o. year old male who is a primary care patient of [name], [name], MD. The CCM team was consulted for assistance with chronic disease management and care coordination needs.    Attempted to contact patient for medication management review. Left HIPAA compliant message for patient to return my call at their convenience.   Plan: - Will collaborate with Care Guide to outreach to schedule follow up with me  Catie Darnelle Maffucci, PharmD, [place] [phone]

## 2019-11-19 ENCOUNTER — Telehealth: Payer: Self-pay | Admitting: Family Medicine

## 2019-11-19 NOTE — Chronic Care Management (AMB) (Signed)
  Care Management   Note  11/19/2019 Name: [name] MRN: [id number] DOB: 10-20-1940  Ryan Kirby is a 79 y.o. year old male who is a primary care patient of [name], [name], MD and is actively engaged with the care management team. I reached out to [name] by phone today to assist with re-scheduling a follow up visit with the Pharmacist  Follow up plan: Unsuccessful telephone outreach attempt made. A HIPPA compliant phone message was left for the patient providing contact information and requesting a return call.  The care management team will reach out to the patient again over the next 7 days.  If patient returns call to provider office, please advise to call [facility]  at [place], [place], [place], [place] [address] Direct Dial: [phone] [email or web address] Website: [email or web address]

## 2019-11-20 NOTE — Chronic Care Management (AMB) (Signed)
  Care Management   Note  11/20/2019 Name: Ryan Kirby MRN: [id number] DOB: Apr 30, 1941  Ryan Kirby is a 79 y.o. year old male who is a primary care patient of [name], [name], MD and is actively engaged with the care management team. I reached out to [name] by phone today to assist with re-scheduling a follow up visit with the Pharmacist  Follow up plan: Telephone appointment with care management team member scheduled for:01/27/2020  Noreene Larsson, [place], [place], [place] [address] Direct Dial: [phone] [email or web address] Website: [email or web address]

## 2019-12-11 IMAGING — MR MR LUMBAR SPINE W/O CM
4 of 5 series · 25 of 48 positions shown · non-contrast
Comparison: None.

CLINICAL DATA: 78 y/o M; right lower back pain and right hip pain
for 2 months.

EXAM:
MRI LUMBAR SPINE WITHOUT CONTRAST
TECHNIQUE: Multiplanar, multisequence MR imaging of the lumbar spine was
performed. No intravenous contrast was administered.

[Series 2: T2 · sagittal · 4.0mm · 0.81mm/px · 6 of 17 slices shown (1 of 2)]
[im 1/17]
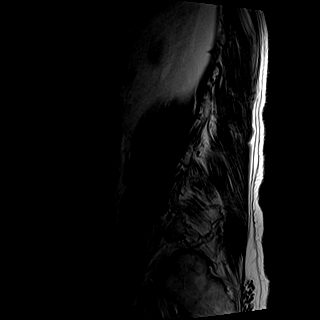
[im 4/17]
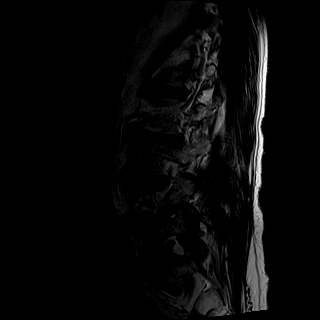
[im 7/17]
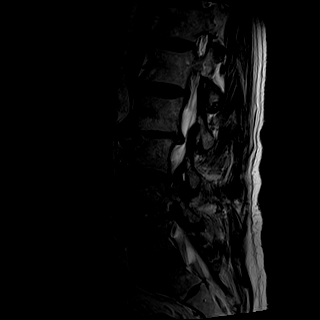
[im 10/17]
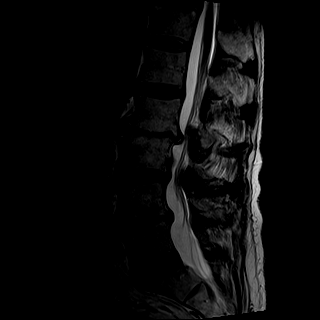
[im 13/17]
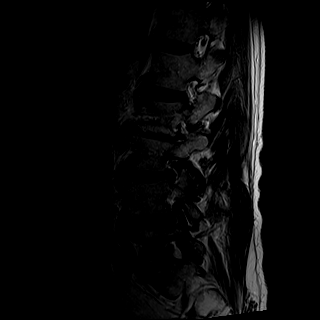
[im 17/17]
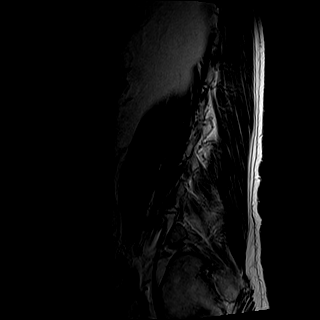

[Series 3: T1 · sagittal · 4.0mm · 0.41mm/px · 6 of 17 slices shown (1 of 2)]
[im 1/17]
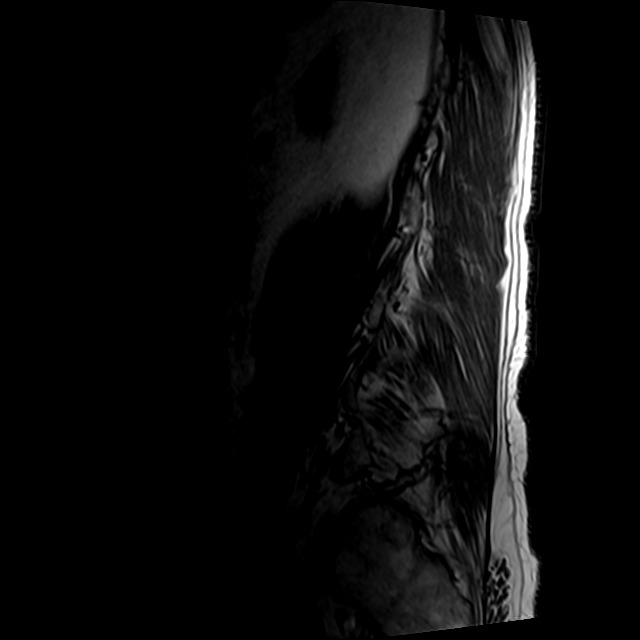
[im 4/17]
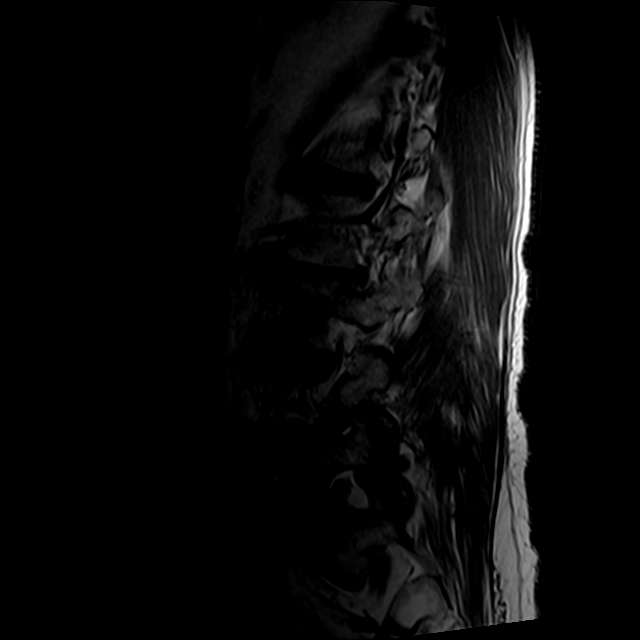
[im 7/17]
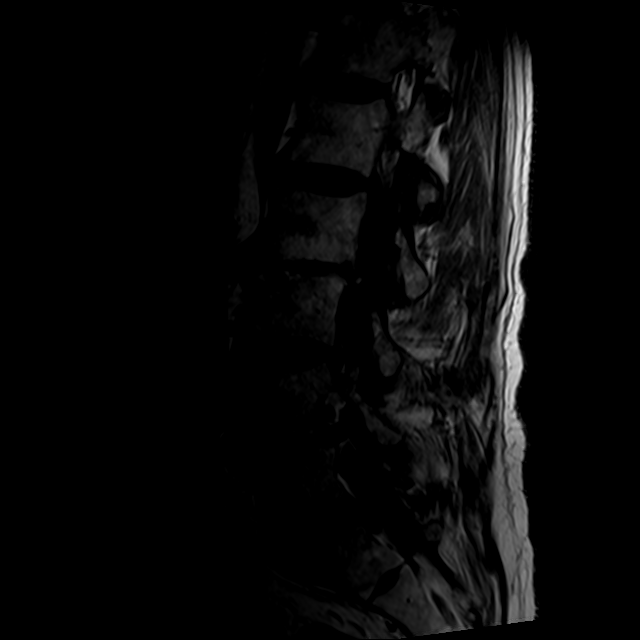
[im 10/17]
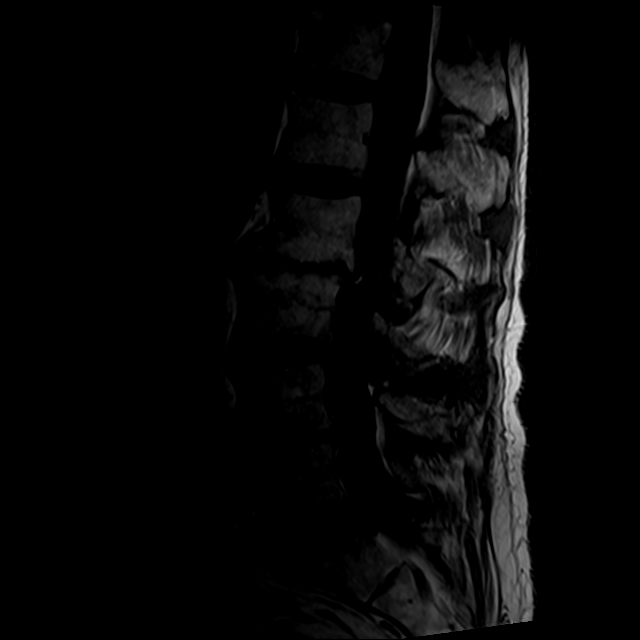
[im 13/17]
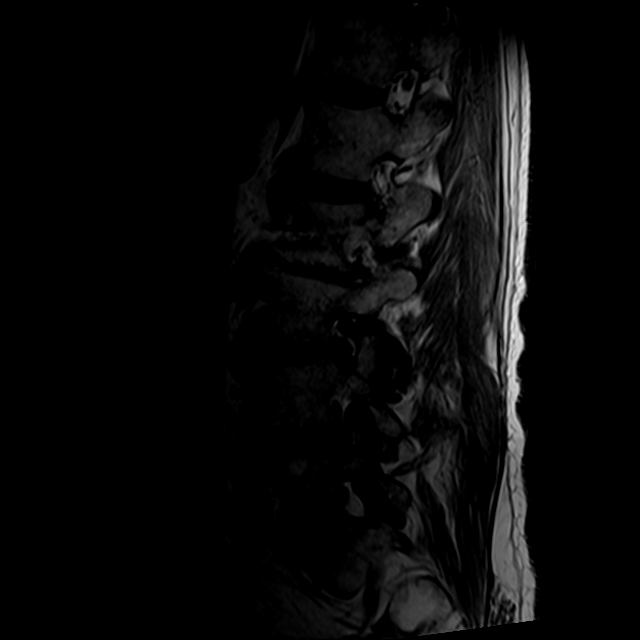
[im 17/17]
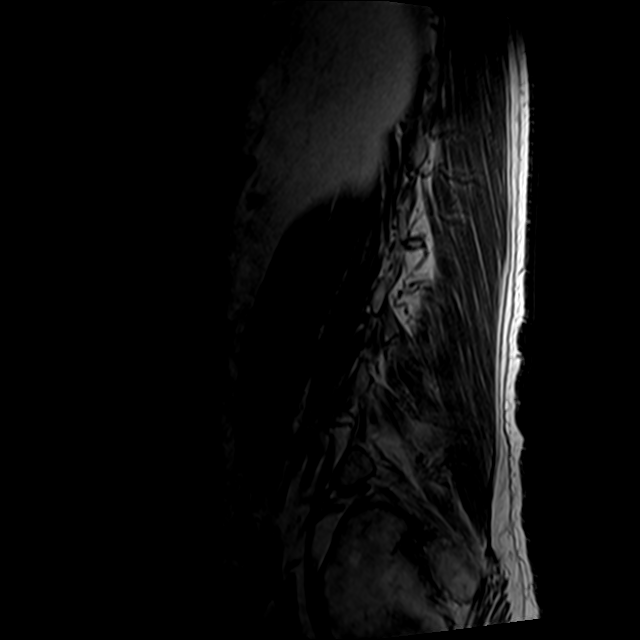

[Series 5: T2 · axial · 4.0mm · 0.78mm/px · z∈[-110,+103]mm · 9 of 39 slices shown (2 of 2)]
[im 1/39]
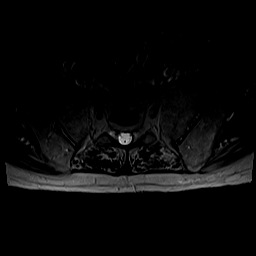
[im 6/39]
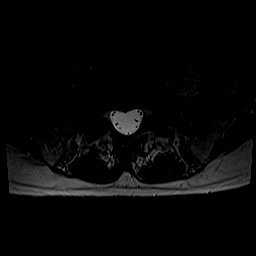
[im 11/39]
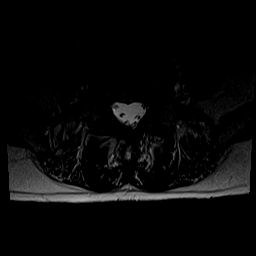
[im 17/39]
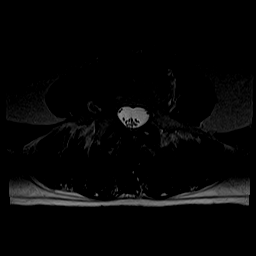
[im 20/39]
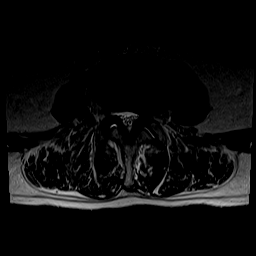
[im 22/39]
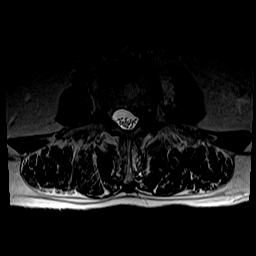
[im 28/39]
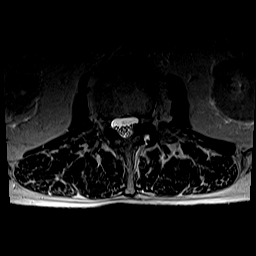
[im 33/39]
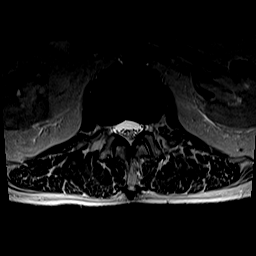
[im 39/39]
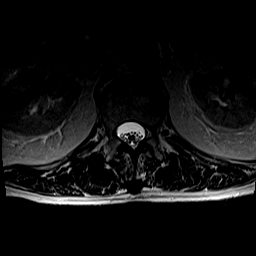

[Series 6: T1 · axial · 4.0mm · 0.39mm/px · z∈[-110,+73]mm · 4 of 39 slices shown (2 of 2)]
[im 1/39]
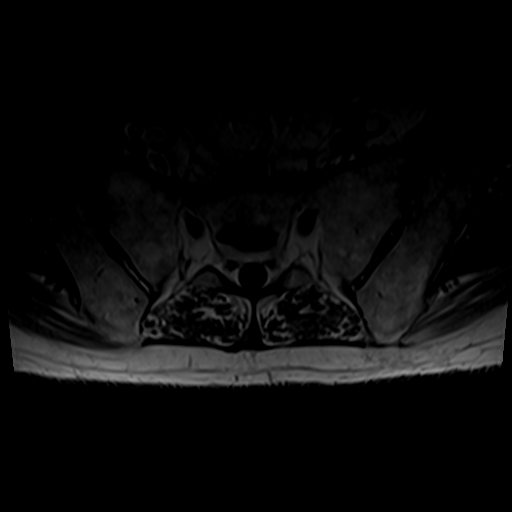
[im 6/39]
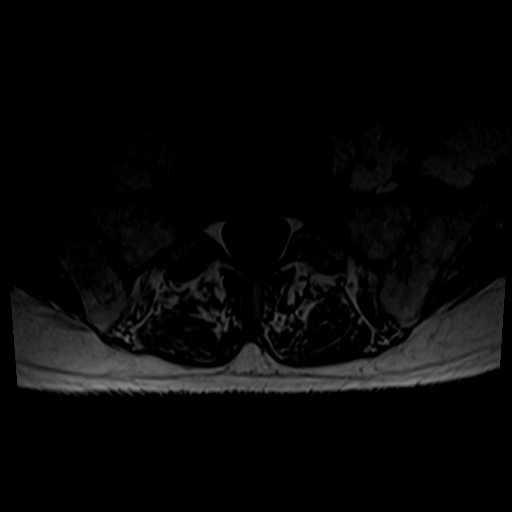
[im 20/39]
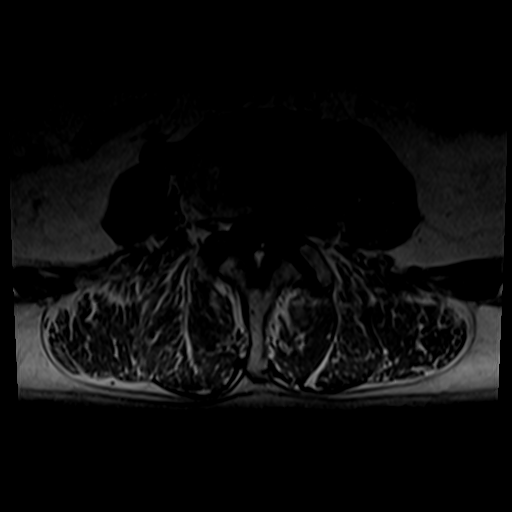
[im 33/39]
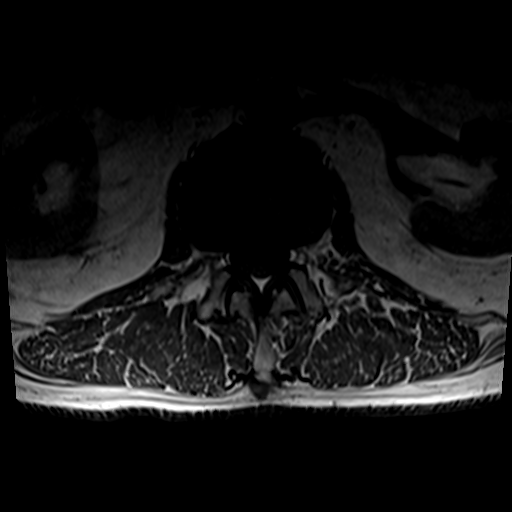

[25 of 48 positions shown; findings below may reference images not displayed]

FINDINGS: Segmentation:  Standard.

Alignment:  Physiologic.

Vertebrae:  No fracture, evidence of discitis, or bone lesion.

Conus medullaris and cauda equina: Conus extends to the L1 level.
Conus and cauda equina appear normal.

Paraspinal and other soft tissues: Negative.

Disc levels:

L1-2: Mild disc bulge. No significant foraminal or spinal canal
stenosis.

L2-3: Disc bulge with central annular fissures and endplate marginal
osteophytes eccentric to left foraminal and extraforaminal zone
combined with mild facet hypertrophy. Mild right and moderate left
neural foraminal stenosis. Partial effacement of lateral recesses.
Mild spinal canal stenosis.

L3-4: Diffuse disc bulge, facet hypertrophy, and ligamentum flavum
hypertrophy combined with a left subarticular and foraminal disc
extrusion with superior migration to the L3 pedicle level and
additional left extraforaminal disc protrusion (series 6, image 18
and 21). There is contact on the exiting left L3 nerve root in the
left lateral recess by the disc extrusion and in the extraforaminal
zone by the extraforaminal protrusion. Mild spinal canal stenosis.

L4-5: Mild disc bulge eccentric to the right combined with right
foraminal and extraforaminal endplate marginal osteophytes as well
as right facet and ligamentum flavum hypertrophy. Severe right
neural foraminal stenosis. Mild left neural foraminal stenosis. Mild
spinal canal stenosis.

L5-S1: No significant disc displacement, foraminal stenosis, or
canal stenosis.
IMPRESSION: 1. No acute osseous abnormality or malalignment.
2. Lumbar spondylosis with disc and facet degenerative changes
greatest at the L3-4 and L4-5 levels.
3. Disc contact on exiting left L3 nerve root at the L3-4 level by a
left subarticular disc extrusion and left extraforaminal disc
protrusion.
4. Severe right L4-5 multifactorial neural foraminal stenosis.
Multilevel mild and moderate neural foraminal stenosis.
5. Mild L2-3, L3-4, and L4-5 spinal canal stenosis. No high-grade
spinal canal stenosis.

## 2020-01-26 ENCOUNTER — Ambulatory Visit (INDEPENDENT_AMBULATORY_CARE_PROVIDER_SITE_OTHER): Payer: Medicare HMO | Admitting: Family Medicine

## 2020-01-26 ENCOUNTER — Encounter: Payer: Self-pay | Admitting: Family Medicine

## 2020-01-26 DIAGNOSIS — J069 Acute upper respiratory infection, unspecified: Secondary | ICD-10-CM | POA: Diagnosis not present

## 2020-01-26 MED ORDER — AMOXICILLIN-POT CLAVULANATE 875-125 MG PO TABS: 1.0000 | ORAL_TABLET | Freq: Two times a day (BID) | ORAL | 0 refills | Status: DC

## 2020-01-26 MED ORDER — MUCINEX 600 MG PO TB12: 600.0000 mg | ORAL_TABLET | Freq: Two times a day (BID) | ORAL | 0 refills | Status: DC

## 2020-01-26 NOTE — Progress Notes (Signed)
There were no vitals taken for this visit.   Subjective:    Patient ID: Ryan Kirby, male    DOB: 04/17/1941, 79 y.o.   MRN: [id number]  HPI: Ryan Kirby is a 79 y.o. male  Chief Complaint  Patient presents with  . Fatigue    x a few days  . Nasal Congestion    . This visit was completed via telephone due to the restrictions of the COVID-19 pandemic. All issues as above were discussed and addressed. Physical exam was done as above through visual confirmation on telephone. If it was felt that the patient should be evaluated in the office, they were directed there. The patient verbally consented to this visit. . Location of the patient: home . Location of the provider: work . Those involved with this call:  . Provider: Merrie Roof, PA-C . CMA: Lesle Chris, [place] . Front Desk/Registration: Jill Side  . Time spent on call: 15 minutes on the phone discussing health concerns. 5 minutes total spent in review of patient's record and preparation of their chart. I verified patient identity using two factors (patient name and date of birth). Patient consents verbally to being seen via telemedicine visit today.   5 days of fatigue, congestion, sinus pain and pressure that is worsening over course. Known hx of allergic rhinitis, has been taking zyrtec daily this spring which previously had been helping. Has been working outside a lot lately. Denies fever, chills, cough, CP, SOB, sick contacts, recent travel.   Relevant past medical, surgical, family and social history reviewed and updated as indicated. Interim medical history since our last visit reviewed. Allergies and medications reviewed and updated.  Review of Systems  Per HPI unless specifically indicated above     Objective:    There were no vitals taken for this visit.  Wt Readings from Last 3 Encounters:  08/13/19 186 lb (84.4 kg)  02/11/19 182 lb (82.6 kg)  02/11/19 182 lb (82.6 kg)    Physical Exam  Unable to  perform PE due to patient lack of access to video technology for today's visit  Results for orders placed or performed in visit on 08/13/19  Comprehensive metabolic panel  Result Value Ref Range   Glucose 217 (H) 65 - 99 mg/dL   BUN 19 8 - 27 mg/dL   Creatinine, Ser 1.30 (H) 0.76 - 1.27 mg/dL   GFR calc non Af Amer 53 (L) >59 mL/min/1.73   GFR calc Af Amer 61 >59 mL/min/1.73   BUN/Creatinine Ratio 15 10 - 24   Sodium 139 134 - 144 mmol/L   Potassium 4.1 3.5 - 5.2 mmol/L   Chloride 97 96 - 106 mmol/L   CO2 27 20 - 29 mmol/L   Calcium 10.9 (H) 8.6 - 10.2 mg/dL   Total Protein 6.8 6.0 - 8.5 g/dL   Albumin 4.7 3.7 - 4.7 g/dL   Globulin, Total 2.1 1.5 - 4.5 g/dL   Albumin/Globulin Ratio 2.2 1.2 - 2.2   Bilirubin Total 0.5 0.0 - 1.2 mg/dL   Alkaline Phosphatase 113 39 - 117 IU/L   AST 21 0 - 40 IU/L   ALT 11 0 - 44 IU/L  Lipid Panel w/o Chol/HDL Ratio out  Result Value Ref Range   Cholesterol, Total 152 100 - 199 mg/dL   Triglycerides 192 (H) 0 - 149 mg/dL   HDL 49 >39 mg/dL   VLDL Cholesterol Cal 32 5 - 40 mg/dL   LDL Chol Calc (NIH) 71  0 - 99 mg/dL  UA/M w/rflx Culture, Routine   Specimen: Urine   URINE  Result Value Ref Range   Specific Gravity, UA 1.010 1.005 - 1.030   pH, UA 6.5 5.0 - 7.5   Color, UA Yellow Yellow   Appearance Ur Clear Clear   Leukocytes,UA Negative Negative   Protein,UA Negative Negative/Trace   Glucose, UA 3+ (A) Negative   Ketones, UA Negative Negative   RBC, UA Negative Negative   Bilirubin, UA Negative Negative   Urobilinogen, Ur 0.2 0.2 - 1.0 mg/dL   Nitrite, UA Negative Negative  HgB A1c  Result Value Ref Range   Hgb A1c MFr Bld 7.3 (H) 4.8 - 5.6 %   Est. average glucose Bld gHb Est-mCnc 163 mg/dL      Assessment & Plan:   Problem List Items Addressed This Visit    None    Visit Diagnoses    Upper respiratory tract infection, unspecified type    -  Primary   Tx with augmentin, mucinex, supportive home care and OTC remedies. F/u for in  person eval next week of fatigue not resolving       Follow up plan: Return if symptoms worsen or fail to improve.

## 2020-01-27 ENCOUNTER — Ambulatory Visit (INDEPENDENT_AMBULATORY_CARE_PROVIDER_SITE_OTHER): Payer: Medicare HMO | Admitting: Pharmacist

## 2020-01-27 DIAGNOSIS — N183 Chronic kidney disease, stage 3 unspecified: Secondary | ICD-10-CM

## 2020-01-27 DIAGNOSIS — I10 Essential (primary) hypertension: Secondary | ICD-10-CM

## 2020-01-27 DIAGNOSIS — E1122 Type 2 diabetes mellitus with diabetic chronic kidney disease: Secondary | ICD-10-CM

## 2020-01-27 NOTE — Patient Instructions (Addendum)
Mr. Burkett,   Call the Customer Service number on the back of your [facility] Card, and ask about "Over the Counter Benefits". Ask if there is a way to get a blood pressure machine from your insurance. If not, I do recommend you consider buying one at the pharmacy. I recommend one with an arm cuff, instead of a wrist cuff.   You are on multiple medications for blood pressure - it would be good for [place] to see how you blood pressure is running at home.   Please feel free to call me with any questions!   Catie Darnelle Maffucci, PharmD [phone]  Visit Information  Goals Addressed            This Visit's Progress     Patient Stated   . PharmD "I can't afford my Jardiance" (pt-stated)       Current Barriers:  . Diabetes: uncontrolled, most recent Q2I 2.9%; complicated w/ CKD, HTN, CAD o Does report some constipation. Has been taking senna/docusate at bedtime for the past 3 days. Took Miralax this morning.  . Last eGFR 53 mL/min . Current antihyperglycemic regimen: metformin 500 mg BID, glipizide 5 mg QAM, Jardiance 25 mg daily  o Denies having been on higher doses of metformin o APPROVED for Jardiance assistance through [employer] through 09/02/2020 . Current blood sugar readings:   Fasting  5-Apr 140  6-Apr 181  7-Apr 152  8-Apr 163  10-Apr 127  12-Apr 147  20-Apr 135  24-Apr 116  26-Apr 188  2-May 129  5-May 140  9-May 131  15-May 161  19-May 137  23-May 118   144   . Cardiovascular risk reduction: o Current hypertensive regimen: amlodipine 10 mg QAM, benazepril/HCTZ 20/12.5 mg QAM, carvedilol 25 mg BID, clonidine 0.3 mg BID, doxazosin 2 mg QAM, furosemide 40 mg QAM - Is not checking BP at home, does NOT have a BP machine o Current hyperlipidemia regimen: pravastatin 40 mg daily; last LDL right at goal of 70; o Current antiplatelet regimen: ASA 81 mg daily   Pharmacist Clinical Goal(s):  Marland Kitchen Over the next 90 days, patient will work with PharmD and primary care provider  to address optimized medication management  Interventions: . Comprehensive medication review performed, medication list updated in electronic medical record . Inter-disciplinary care team collaboration (see longitudinal plan of care) . Reviewed goal A1c, goal fasting, and goal 2 hour post prandial glucose readings.  . Discussed potential to increase metformin. Pending A1c, recommend maximizing metformin to 1000 mg BID . Reviewed importance of home BP monitoring, given multiple antihypertensives. Denies any s/sx lightheadedness or dizziness. He notes issues w/ constipation - this may be exacerbated by anticholergic potential of clonidine. Pending BP, consider dose decrease of clonidine (0.2 mg BID). If compensation needed, can increase benazepril/HCTZ dose . Mailing patient information about investigating OTC benefits for BP machine  Patient Self Care Activities:  . Patient will check blood glucose daily , document, and provide at future appointments . Patient will take medications as prescribed . Patient will report any questions or concerns to provider   Please see past updates related to this goal by clicking on the "Past Updates" button in the selected goal         Patient verbalizes understanding of instructions provided today.    Plan:  - Scheduled f/u call in ~ 9 weeks  Catie Darnelle Maffucci, PharmD, [place] [phone]

## 2020-01-27 NOTE — Chronic Care Management (AMB) (Signed)
Chronic Care Management   Follow Up Note   01/27/2020 Name: Ryan Kirby MRN: [id number] DOB: February 01, 1941  Referred by: Guadalupe Maple, MD Reason for referral : Chronic Care Management (Medication Management)   Ryan Kirby is a 79 y.o. year old male who is a primary care patient of [name], [name], MD. The CCM team was consulted for assistance with chronic disease management and care coordination needs.    Contacted patient for medication management review.   Review of patient status, including review of consultants reports, relevant laboratory and other test results, and collaboration with appropriate care team members and the patient's provider was performed as part of comprehensive patient evaluation and provision of chronic care management services.    SDOH (Social Determinants of Health) assessments performed: No See Care Plan activities for detailed interventions related to [facility])     Outpatient Encounter Medications as of 01/27/2020  Medication Sig Note  . amoxicillin-clavulanate (AUGMENTIN) 875-125 MG tablet Take 1 tablet by mouth 2 (two) times daily.   Marland Kitchen aspirin EC 81 MG tablet Take 81 mg by mouth daily.   . empagliflozin (JARDIANCE) 25 MG TABS tablet Take 25 mg by mouth daily before breakfast.   . glipiZIDE (GLUCOTROL) 5 MG tablet Take 1 tablet (5 mg total) by mouth daily before breakfast.   . metFORMIN (GLUCOPHAGE) 500 MG tablet Take 1 tablet (500 mg total) by mouth 2 (two) times daily.   Marland Kitchen senna (SENOKOT) 8.6 MG TABS tablet Take 2 tablets (17.2 mg total) by mouth at bedtime as needed for mild constipation.   Marland Kitchen amLODipine (NORVASC) 10 MG tablet Take 1 tablet (10 mg total) by mouth daily.   . benazepril-hydrochlorthiazide (LOTENSIN HCT) 20-12.5 MG tablet Take 1 tablet by mouth daily.   . carvedilol (COREG) 25 MG tablet Take 2 tablets (50 mg total) by mouth 2 (two) times daily.   . cetirizine (ZYRTEC) 10 MG tablet Take 10 mg by mouth daily. 08/13/2019: As needed  .  cloNIDine (CATAPRES) 0.3 MG tablet Take 1 tablet (0.3 mg total) by mouth 2 (two) times daily.   Marland Kitchen doxazosin (CARDURA) 2 MG tablet Take 1 tablet (2 mg total) by mouth every morning.   Marland Kitchen esomeprazole (NEXIUM) 20 MG capsule Take 20 mg by mouth daily. Reported on 09/12/2015 12/01/2015: Sometimes takes Nexium sometimes Prilosec  . furosemide (LASIX) 40 MG tablet Take 1 tablet (40 mg total) by mouth daily.   Marland Kitchen guaiFENesin (MUCINEX) 600 MG 12 hr tablet Take 1 tablet (600 mg total) by mouth 2 (two) times daily.   . potassium chloride SA (K-DUR) 20 MEQ tablet Take 1 tablet (20 mEq total) by mouth daily.   . pravastatin (PRAVACHOL) 40 MG tablet Take 1 tablet (40 mg total) by mouth at bedtime.    No facility-administered encounter medications on file as of 01/27/2020.     Objective:   Goals Addressed            This Visit's Progress     Patient Stated   . PharmD "I can't afford my Jardiance" (pt-stated)       Current Barriers:  . Diabetes: uncontrolled, most recent G6Y 4.0%; complicated w/ CKD, HTN, CAD o Does report some constipation. Has been taking senna/docusate at bedtime for the past 3 days. Took Miralax this morning.  . Last eGFR 53 mL/min . Current antihyperglycemic regimen: metformin 500 mg BID, glipizide 5 mg QAM, Jardiance 25 mg daily  o Denies having been on higher doses of metformin o  APPROVED for Jardiance assistance through [employer] through 09/02/2020 . Current blood sugar readings:   Fasting  5-Apr 140  6-Apr 181  7-Apr 152  8-Apr 163  10-Apr 127  12-Apr 147  20-Apr 135  24-Apr 116  26-Apr 188  2-May 129  5-May 140  9-May 131  15-May 161  19-May 137  23-May 118   144   . Cardiovascular risk reduction: o Current hypertensive regimen: amlodipine 10 mg QAM, benazepril/HCTZ 20/12.5 mg QAM, carvedilol 25 mg BID, clonidine 0.3 mg BID, doxazosin 2 mg QAM, furosemide 40 mg QAM - Is not checking BP at home, does NOT have a BP machine o Current hyperlipidemia  regimen: pravastatin 40 mg daily; last LDL right at goal of 70; o Current antiplatelet regimen: ASA 81 mg daily   Pharmacist Clinical Goal(s):  Marland Kitchen Over the next 90 days, patient will work with PharmD and primary care provider to address optimized medication management  Interventions: . Comprehensive medication review performed, medication list updated in electronic medical record . Inter-disciplinary care team collaboration (see longitudinal plan of care) . Reviewed goal A1c, goal fasting, and goal 2 hour post prandial glucose readings.  . Discussed potential to increase metformin. Pending A1c, recommend maximizing metformin to 1000 mg BID . Reviewed importance of home BP monitoring, given multiple antihypertensives. Denies any s/sx lightheadedness or dizziness. He notes issues w/ constipation - this may be exacerbated by anticholergic potential of clonidine. Pending BP, consider dose decrease of clonidine (0.2 mg BID). If compensation needed, can increase benazepril/HCTZ dose . Mailing patient information about investigating OTC benefits for BP machine  Patient Self Care Activities:  . Patient will check blood glucose daily , document, and provide at future appointments . Patient will take medications as prescribed . Patient will report any questions or concerns to provider   Please see past updates related to this goal by clicking on the "Past Updates" button in the selected goal          Plan:  - Scheduled f/u call in ~ 9 weeks  Catie Darnelle Maffucci, PharmD, [place] [phone]

## 2020-02-10 DIAGNOSIS — E119 Type 2 diabetes mellitus without complications: Secondary | ICD-10-CM | POA: Diagnosis not present

## 2020-02-10 LAB — HM DIABETES EYE EXAM

## 2020-02-11 ENCOUNTER — Ambulatory Visit: Payer: Medicare HMO

## 2020-02-11 ENCOUNTER — Encounter: Payer: Medicare HMO | Admitting: Family Medicine

## 2020-02-17 ENCOUNTER — Encounter: Payer: Self-pay | Admitting: Family Medicine

## 2020-02-17 ENCOUNTER — Ambulatory Visit: Payer: Medicare HMO

## 2020-02-17 ENCOUNTER — Ambulatory Visit (INDEPENDENT_AMBULATORY_CARE_PROVIDER_SITE_OTHER): Payer: Medicare HMO | Admitting: Family Medicine

## 2020-02-17 ENCOUNTER — Other Ambulatory Visit: Payer: Self-pay

## 2020-02-17 VITALS — BP 120/69 | HR 76 | Temp 97.8°F | Ht 69.5 in | Wt 175.0 lb

## 2020-02-17 DIAGNOSIS — E1122 Type 2 diabetes mellitus with diabetic chronic kidney disease: Secondary | ICD-10-CM

## 2020-02-17 DIAGNOSIS — N400 Enlarged prostate without lower urinary tract symptoms: Secondary | ICD-10-CM | POA: Diagnosis not present

## 2020-02-17 DIAGNOSIS — Z1159 Encounter for screening for other viral diseases: Secondary | ICD-10-CM

## 2020-02-17 DIAGNOSIS — Z0000 Encounter for general adult medical examination without abnormal findings: Secondary | ICD-10-CM | POA: Diagnosis not present

## 2020-02-17 DIAGNOSIS — I2583 Coronary atherosclerosis due to lipid rich plaque: Secondary | ICD-10-CM | POA: Diagnosis not present

## 2020-02-17 DIAGNOSIS — N183 Chronic kidney disease, stage 3 unspecified: Secondary | ICD-10-CM | POA: Diagnosis not present

## 2020-02-17 DIAGNOSIS — E785 Hyperlipidemia, unspecified: Secondary | ICD-10-CM | POA: Diagnosis not present

## 2020-02-17 DIAGNOSIS — I2510 Atherosclerotic heart disease of native coronary artery without angina pectoris: Secondary | ICD-10-CM

## 2020-02-17 DIAGNOSIS — I129 Hypertensive chronic kidney disease with stage 1 through stage 4 chronic kidney disease, or unspecified chronic kidney disease: Secondary | ICD-10-CM

## 2020-02-17 DIAGNOSIS — E1169 Type 2 diabetes mellitus with other specified complication: Secondary | ICD-10-CM

## 2020-02-17 LAB — UA/M W/RFLX CULTURE, ROUTINE
Bilirubin, UA: NEGATIVE
Ketones, UA: NEGATIVE
Leukocytes,UA: NEGATIVE
Nitrite, UA: NEGATIVE
Protein,UA: NEGATIVE
RBC, UA: NEGATIVE
Specific Gravity, UA: 1.015 (ref 1.005–1.030)
Urobilinogen, Ur: 0.2 mg/dL (ref 0.2–1.0)
pH, UA: 7 (ref 5.0–7.5)

## 2020-02-17 NOTE — Progress Notes (Signed)
BP 120/69    Pulse 76    Temp 97.8 F (36.6 C) (Oral)    Ht 5' 9.5" (1.765 m)    Wt 175 lb (79.4 kg)    SpO2 99%    BMI 25.47 kg/m    Subjective:    Patient ID: Ryan Kirby, male    DOB: June 09, 1941, 79 y.o.   MRN: [id number]  HPI: Ryan Kirby is a 79 y.o. male presenting on 02/17/2020 for comprehensive medical examination. Current medical complaints include:see below  HTN - Taking medications faithfully without side effects. Home BPs running 120s/70s consistently. Denies CP, SOB, HAs, dizziness.   HLD, CAD - On pravastatin, tolerating well without myalgias. Trying to eat healthy and be active. Denies claudication, CP, SOB.   DM - Home BSs running 120 - 150s fasting in the AM. Tolerating medications well, no low blood sugar spells.   BPH - sxs stable on doxazosin.   No new concerns.   He currently lives with: Interim Problems from his last visit: no  Depression Screen done today and results listed below:  Depression screen [facility] 2/9 02/17/2020 02/11/2019 12/16/2017 09/23/2017 06/24/2017  Decreased Interest 0 0 1 0 0  Down, Depressed, Hopeless 1 1 1  0 0  PHQ - 2 Score 1 1 2  0 0  Altered sleeping 0 1 2 - -  Tired, decreased energy 1 1 2  - -  Change in appetite 0 0 0 - -  Feeling bad or failure about yourself  0 0 0 - -  Trouble concentrating 0 0 0 - -  Moving slowly or fidgety/restless 0 0 0 - -  Suicidal thoughts 0 0 0 - -  PHQ-9 Score 2 3 6  - -  Difficult doing work/chores - Not difficult at all Not difficult at all - -    The patient does not have a history of falls. I did complete a risk assessment for falls. A plan of care for falls was documented.   Past Medical History:  Past Medical History:  Diagnosis Date   Allergy    CAD (coronary artery disease)    Chronic kidney disease    pt is not on dialysis   Diabetes mellitus without complication ([place])    GERD (gastroesophageal reflux disease)    Hyperlipidemia     Surgical History:  Past Surgical History:    Procedure Laterality Date   BOWEL RESECTION  2004   CORONARY ARTERY BYPASS GRAFT     FINGER AMPUTATION Left 2011   5th   KNEE ARTHROSCOPY Right 09/12/2015   Procedure: ARTHROSCOPY KNEE, LATERAL MENISECTOMY, CHONDROPLASTY;  Surgeon: Dereck Leep, MD;  Location: [facility] ORS;  Service: Orthopedics;  Laterality: Right;    Medications:  Current Outpatient Medications on File Prior to Visit  Medication Sig   aspirin EC 81 MG tablet Take 81 mg by mouth daily.   cetirizine (ZYRTEC) 10 MG tablet Take 10 mg by mouth daily.   empagliflozin (JARDIANCE) 25 MG TABS tablet Take 25 mg by mouth daily before breakfast.   esomeprazole (NEXIUM) 20 MG capsule Take 20 mg by mouth daily. Reported on 09/12/2015   senna (SENOKOT) 8.6 MG TABS tablet Take 2 tablets (17.2 mg total) by mouth at bedtime as needed for mild constipation.   No current facility-administered medications on file prior to visit.    Allergies:  Allergies  Allergen Reactions   Oxycontin [Oxycodone] Nausea And Vomiting    Social History:  Social History   Socioeconomic History  Marital status: Single    Spouse name: Not on file   Number of children: Not on file   Years of education: Not on file   Highest education level: 12th grade  Occupational History   Occupation: retired  Tobacco Use   Smoking status: Never Smoker   Smokeless tobacco: Never Used  Scientific laboratory technician Use: Never used  Substance and Sexual Activity   Alcohol use: No    Alcohol/week: 0.0 standard drinks   Drug use: No   Sexual activity: Not on file  Other Topics Concern   Not on file  Social History Narrative   Not on file   Social Determinants of Health   Financial Resource Strain:    Difficulty of Paying Living Expenses:   Food Insecurity:    Worried About Charity fundraiser in the Last Year:    Arboriculturist in the Last Year:   Transportation Needs:    Film/video editor (Medical):    Lack of Transportation  (Non-Medical):   Physical Activity:    Days of Exercise per Week:    Minutes of Exercise per Session:   Stress:    Feeling of Stress :   Social Connections:    Frequency of Communication with Friends and Family:    Frequency of Social Gatherings with Friends and Family:    Attends Religious Services:    Active Member of Clubs or Organizations:    Attends Music therapist:    Marital Status:   Intimate Partner Violence:    Fear of Current or Ex-Partner:    Emotionally Abused:    Physically Abused:    Sexually Abused:    Social History   Tobacco Use  Smoking Status Never Smoker  Smokeless Tobacco Never Used   Social History   Substance and Sexual Activity  Alcohol Use No   Alcohol/week: 0.0 standard drinks    Family History:  Family History  Problem Relation Age of Onset   Arthritis Mother    Hypertension Mother    Breast cancer Mother    Arthritis Father    Hypertension Father    Colon cancer Father     Past medical history, surgical history, medications, allergies, family history and social history reviewed with patient today and changes made to appropriate areas of the chart.   Review of Systems - General ROS: negative Psychological ROS: negative Ophthalmic ROS: negative ENT ROS: negative Allergy and Immunology ROS: negative Hematological and Lymphatic ROS: negative Endocrine ROS: negative Breast ROS: negative for breast lumps Respiratory ROS: no cough, shortness of breath, or wheezing Cardiovascular ROS: no chest pain or dyspnea on exertion Gastrointestinal ROS: no abdominal pain, change in bowel habits, or black or bloody stools Genito-Urinary ROS: no dysuria, trouble voiding, or hematuria Musculoskeletal ROS: negative Neurological ROS: no TIA or stroke symptoms Dermatological ROS: negative All other ROS negative except what is listed above and in the HPI.      Objective:    BP 120/69    Pulse 76    Temp 97.8 F  (36.6 C) (Oral)    Ht 5' 9.5" (1.765 m)    Wt 175 lb (79.4 kg)    SpO2 99%    BMI 25.47 kg/m   Wt Readings from Last 3 Encounters:  02/17/20 175 lb (79.4 kg)  08/13/19 186 lb (84.4 kg)  02/11/19 182 lb (82.6 kg)    Physical Exam Vitals and nursing note reviewed.  Constitutional:  General: He is not in acute distress.    Appearance: He is well-developed.  HENT:     Head: Atraumatic.     Right Ear: Tympanic membrane and external ear normal.     Left Ear: Tympanic membrane and external ear normal.     Nose: Nose normal.     Mouth/Throat:     Mouth: Mucous membranes are moist.     Pharynx: Oropharynx is clear.  Eyes:     General: No scleral icterus.    Conjunctiva/sclera: Conjunctivae normal.     Pupils: Pupils are equal, round, and reactive to light.  Cardiovascular:     Rate and Rhythm: Normal rate and regular rhythm.     Heart sounds: Normal heart sounds. No murmur heard.   Pulmonary:     Effort: Pulmonary effort is normal. No respiratory distress.     Breath sounds: Normal breath sounds.  Abdominal:     General: Bowel sounds are normal. There is no distension.     Palpations: Abdomen is soft. There is no mass.     Tenderness: There is no abdominal tenderness. There is no guarding.  Genitourinary:    Comments: GU exam declined with shared decision making Musculoskeletal:        General: No tenderness. Normal range of motion.     Cervical back: Normal range of motion and neck supple.  Skin:    General: Skin is warm and dry.     Findings: No rash.  Neurological:     General: No focal deficit present.     Mental Status: He is alert and oriented to person, place, and time.     Deep Tendon Reflexes: Reflexes are normal and symmetric.  Psychiatric:        Mood and Affect: Mood normal.        Behavior: Behavior normal.        Thought Content: Thought content normal.        Judgment: Judgment normal.     Diabetic Foot Exam - Simple   Simple Foot Form Diabetic Foot  exam was performed with the following findings: Yes 02/17/2020  9:47 AM  Visual Inspection No deformities, no ulcerations, no other skin breakdown bilaterally: Yes Sensation Testing Intact to touch and monofilament testing bilaterally: Yes Pulse Check Posterior Tibialis and Dorsalis pulse intact bilaterally: Yes Comments     Results for orders placed or performed in visit on 02/17/20  CBC with Differential/Platelet  Result Value Ref Range   WBC 4.5 3.4 - 10.8 x10E3/uL   RBC 4.59 4.14 - 5.80 x10E6/uL   Hemoglobin 14.2 13.0 - 17.7 g/dL   Hematocrit 42.8 37.5 - 51.0 %   MCV 93 79 - 97 fL   MCH 30.9 26.6 - 33.0 pg   MCHC 33.2 31 - 35 g/dL   RDW 12.6 11.6 - 15.4 %   Platelets 186 150 - 450 x10E3/uL   Neutrophils 63 Not Estab. %   Lymphs 20 Not Estab. %   Monocytes 9 Not Estab. %   Eos 6 Not Estab. %   Basos 2 Not Estab. %   Neutrophils Absolute 2.9 1 - 7 x10E3/uL   Lymphocytes Absolute 0.9 0 - 3 x10E3/uL   Monocytes Absolute 0.4 0 - 0 x10E3/uL   EOS (ABSOLUTE) 0.3 0.0 - 0.4 x10E3/uL   Basophils Absolute 0.1 0 - 0 x10E3/uL   Immature Granulocytes 0 Not Estab. %   Immature Grans (Abs) 0.0 0.0 - 0.1 x10E3/uL  Comprehensive metabolic panel  Result Value Ref Range   Glucose 126 (H) 65 - 99 mg/dL   BUN 18 8 - 27 mg/dL   Creatinine, Ser 1.20 0.76 - 1.27 mg/dL   GFR calc non Af Amer 58 (L) >59 mL/min/1.73   GFR calc Af Amer 67 >59 mL/min/1.73   BUN/Creatinine Ratio 15 10 - 24   Sodium 140 134 - 144 mmol/L   Potassium 3.4 (L) 3.5 - 5.2 mmol/L   Chloride 100 96 - 106 mmol/L   CO2 25 20 - 29 mmol/L   Calcium 10.2 8.6 - 10.2 mg/dL   Total Protein 6.4 6.0 - 8.5 g/dL   Albumin 4.2 3.7 - 4.7 g/dL   Globulin, Total 2.2 1.5 - 4.5 g/dL   Albumin/Globulin Ratio 1.9 1.2 - 2.2   Bilirubin Total 0.5 0.0 - 1.2 mg/dL   Alkaline Phosphatase 81 48 - 121 IU/L   AST 18 0 - 40 IU/L   ALT 5 0 - 44 IU/L  Lipid Panel w/o Chol/HDL Ratio  Result Value Ref Range   Cholesterol, Total 123 100 - 199  mg/dL   Triglycerides 107 0 - 149 mg/dL   HDL 48 >39 mg/dL   VLDL Cholesterol Cal 20 5 - 40 mg/dL   LDL Chol Calc (NIH) 55 0 - 99 mg/dL  PSA  Result Value Ref Range   Prostate Specific Ag, Serum 1.0 0.0 - 4.0 ng/mL  UA/M w/rflx Culture, Routine   Specimen: Blood   BLD  Result Value Ref Range   Specific Gravity, UA 1.015 1.005 - 1.030   pH, UA 7.0 5.0 - 7.5   Color, UA Yellow Yellow   Appearance Ur Clear Clear   Leukocytes,UA Negative Negative   Protein,UA Negative Negative/Trace   Glucose, UA 3+ (A) Negative   Ketones, UA Negative Negative   RBC, UA Negative Negative   Bilirubin, UA Negative Negative   Urobilinogen, Ur 0.2 0.2 - 1.0 mg/dL   Nitrite, UA Negative Negative  HgB A1c  Result Value Ref Range   Hgb A1c MFr Bld 6.3 (H) 4.8 - 5.6 %   Est. average glucose Bld gHb Est-mCnc 134 mg/dL  Hepatitis C antibody  Result Value Ref Range   Hep C Virus Ab <0.1 0.0 - 0.9 s/co ratio      Assessment & Plan:   Problem List Items Addressed This Visit      Cardiovascular and Mediastinum   Parenchymal renal hypertension    BPs stable and WNL, continue current regimen      Relevant Orders   CBC with Differential/Platelet (Completed)   Comprehensive metabolic panel (Completed)   UA/M w/rflx Culture, Routine (Completed)   CAD (coronary artery disease) - Primary    Stable, recheck lipids and continue current regimen        Endocrine   Hyperlipidemia associated with type 2 diabetes mellitus ([place])    Recheck lpids, adjust as needed. Continue good lifestyle habits for control in addition to current regimen      Relevant Orders   Lipid Panel w/o Chol/HDL Ratio (Completed)   DM type 2 causing CKD stage 3 (HCC)    Recheck A1C, adjust as needed. Continue current regimen      Relevant Orders   HgB A1c (Completed)     Genitourinary   BPH (benign prostatic hyperplasia)    Recheck PSA, continue doxazosin and close monitoring      Relevant Orders   PSA (Completed)      Other Visit Diagnoses    Annual  physical exam       Need for hepatitis C screening test       Relevant Orders   Hepatitis C antibody       Discussed aspirin prophylaxis for myocardial infarction prevention and decision was made to continue ASA  LABORATORY TESTING:  Health maintenance labs ordered today as discussed above.   The natural history of prostate cancer and ongoing controversy regarding screening and potential treatment outcomes of prostate cancer has been discussed with the patient. The meaning of a false positive PSA and a false negative PSA has been discussed. He indicates understanding of the limitations of this screening test and wishes to proceed with screening PSA testing.   IMMUNIZATIONS:   - Tdap: Tetanus vaccination status reviewed: last tetanus booster within 10 years. - Influenza: Up to date - Pneumovax: Up to date - Prevnar: Up to date - HPV: Not applicable - Zostavax vaccine: Up to date  SCREENING: - Colonoscopy: Refused  Discussed with patient purpose of the colonoscopy is to detect colon cancer at curable precancerous or early stages   PATIENT COUNSELING:    Sexuality: Discussed sexually transmitted diseases, partner selection, use of condoms, avoidance of unintended pregnancy  and contraceptive alternatives.   Advised to avoid cigarette smoking.  I discussed with the patient that most people either abstain from alcohol or drink within safe limits (<=14/week and <=4 drinks/occasion for males, <=7/weeks and <= 3 drinks/occasion for females) and that the risk for alcohol disorders and other health effects rises proportionally with the number of drinks per week and how often a drinker exceeds daily limits.  Discussed cessation/primary prevention of drug use and availability of treatment for abuse.   Diet: Encouraged to adjust caloric intake to maintain  or achieve ideal body weight, to reduce intake of dietary saturated fat and total fat, to limit sodium  intake by avoiding high sodium foods and not adding table salt, and to maintain adequate dietary potassium and calcium preferably from fresh fruits, vegetables, and low-fat dairy products.    stressed the importance of regular exercise  Injury prevention: Discussed safety belts, safety helmets, smoke detector, smoking near bedding or upholstery.   Dental health: Discussed importance of regular tooth brushing, flossing, and dental visits.   Follow up plan: NEXT PREVENTATIVE PHYSICAL DUE IN 1 YEAR. Return in about 6 months (around 08/18/2020) for CPE.

## 2020-02-18 LAB — LIPID PANEL W/O CHOL/HDL RATIO
Cholesterol, Total: 123 mg/dL (ref 100–199)
HDL: 48 mg/dL (ref >39)
LDL Chol Calc (NIH): 55 mg/dL (ref 0–99)
Triglycerides: 107 mg/dL (ref 0–149)
VLDL Cholesterol Cal: 20 mg/dL (ref 5–40)

## 2020-02-18 LAB — CBC WITH DIFFERENTIAL/PLATELET
Basophils Absolute: 0.1 10*3/uL (ref 0.0–0.2)
Basos: 2 %
EOS (ABSOLUTE): 0.3 10*3/uL (ref 0.0–0.4)
Eos: 6 %
Hematocrit: 42.8 % (ref 37.5–51.0)
Hemoglobin: 14.2 g/dL (ref 13.0–17.7)
Immature Grans (Abs): 0 10*3/uL (ref 0.0–0.1)
Immature Granulocytes: 0 %
Lymphocytes Absolute: 0.9 10*3/uL (ref 0.7–3.1)
Lymphs: 20 %
MCH: 30.9 pg (ref 26.6–33.0)
MCHC: 33.2 g/dL (ref 31.5–35.7)
MCV: 93 fL (ref 79–97)
Monocytes Absolute: 0.4 10*3/uL (ref 0.1–0.9)
Monocytes: 9 %
Neutrophils Absolute: 2.9 10*3/uL (ref 1.4–7.0)
Neutrophils: 63 %
Platelets: 186 10*3/uL (ref 150–450)
RBC: 4.59 x10E6/uL (ref 4.14–5.80)
RDW: 12.6 % (ref 11.6–15.4)
WBC: 4.5 10*3/uL (ref 3.4–10.8)

## 2020-02-18 LAB — COMPREHENSIVE METABOLIC PANEL
ALT: 5 IU/L (ref 0–44)
AST: 18 IU/L (ref 0–40)
Albumin/Globulin Ratio: 1.9 (ref 1.2–2.2)
Albumin: 4.2 g/dL (ref 3.7–4.7)
Alkaline Phosphatase: 81 IU/L (ref 48–121)
BUN/Creatinine Ratio: 15 (ref 10–24)
BUN: 18 mg/dL (ref 8–27)
Bilirubin Total: 0.5 mg/dL (ref 0.0–1.2)
CO2: 25 mmol/L (ref 20–29)
Calcium: 10.2 mg/dL (ref 8.6–10.2)
Chloride: 100 mmol/L (ref 96–106)
Creatinine, Ser: 1.2 mg/dL (ref 0.76–1.27)
GFR calc Af Amer: 67 mL/min/{1.73_m2} (ref >59)
GFR calc non Af Amer: 58 mL/min/{1.73_m2} — ABNORMAL LOW (ref >59)
Globulin, Total: 2.2 g/dL (ref 1.5–4.5)
Glucose: 126 mg/dL — ABNORMAL HIGH (ref 65–99)
Potassium: 3.4 mmol/L — ABNORMAL LOW (ref 3.5–5.2)
Sodium: 140 mmol/L (ref 134–144)
Total Protein: 6.4 g/dL (ref 6.0–8.5)

## 2020-02-18 LAB — PSA: Prostate Specific Ag, Serum: 1 ng/mL (ref 0.0–4.0)

## 2020-02-18 LAB — HEMOGLOBIN A1C
Est. average glucose Bld gHb Est-mCnc: 134 mg/dL
Hgb A1c MFr Bld: 6.3 % — ABNORMAL HIGH (ref 4.8–5.6)

## 2020-02-18 LAB — HEPATITIS C ANTIBODY: Hep C Virus Ab: <0.1 s/co ratio (ref 0.0–0.9)

## 2020-02-19 ENCOUNTER — Other Ambulatory Visit: Payer: Self-pay

## 2020-02-19 DIAGNOSIS — I2583 Coronary atherosclerosis due to lipid rich plaque: Secondary | ICD-10-CM

## 2020-02-19 DIAGNOSIS — I2510 Atherosclerotic heart disease of native coronary artery without angina pectoris: Secondary | ICD-10-CM

## 2020-02-19 DIAGNOSIS — I10 Essential (primary) hypertension: Secondary | ICD-10-CM

## 2020-02-19 DIAGNOSIS — E7800 Pure hypercholesterolemia, unspecified: Secondary | ICD-10-CM

## 2020-02-19 DIAGNOSIS — E119 Type 2 diabetes mellitus without complications: Secondary | ICD-10-CM

## 2020-02-19 MED ORDER — POTASSIUM CHLORIDE CRYS ER 20 MEQ PO TBCR: 20.0000 meq | EXTENDED_RELEASE_TABLET | Freq: Every day | ORAL | 1 refills | Status: DC

## 2020-02-19 MED ORDER — PRAVASTATIN SODIUM 40 MG PO TABS: 40.0000 mg | ORAL_TABLET | Freq: Every day | ORAL | 1 refills | Status: DC

## 2020-02-19 MED ORDER — FUROSEMIDE 40 MG PO TABS: 40.0000 mg | ORAL_TABLET | Freq: Every day | ORAL | 1 refills | Status: DC

## 2020-02-19 MED ORDER — BENAZEPRIL-HYDROCHLOROTHIAZIDE 20-12.5 MG PO TABS: 1.0000 | ORAL_TABLET | Freq: Every day | ORAL | 1 refills | Status: DC

## 2020-02-19 MED ORDER — GLIPIZIDE 5 MG PO TABS: 5.0000 mg | ORAL_TABLET | Freq: Every day | ORAL | 1 refills | Status: DC

## 2020-02-19 MED ORDER — METFORMIN HCL 500 MG PO TABS: 500.0000 mg | ORAL_TABLET | Freq: Two times a day (BID) | ORAL | 1 refills | Status: DC

## 2020-02-19 MED ORDER — CARVEDILOL 25 MG PO TABS: 50.0000 mg | ORAL_TABLET | Freq: Two times a day (BID) | ORAL | 1 refills | Status: DC

## 2020-02-19 MED ORDER — DOXAZOSIN MESYLATE 2 MG PO TABS: 2.0000 mg | ORAL_TABLET | Freq: Every morning | ORAL | 1 refills | Status: DC

## 2020-02-19 MED ORDER — CLONIDINE HCL 0.3 MG PO TABS: 0.3000 mg | ORAL_TABLET | Freq: Two times a day (BID) | ORAL | 1 refills | Status: DC

## 2020-02-19 MED ORDER — AMLODIPINE BESYLATE 10 MG PO TABS: 10.0000 mg | ORAL_TABLET | Freq: Every day | ORAL | 1 refills | Status: DC

## 2020-02-19 NOTE — Telephone Encounter (Signed)
Patient last seem 02/17/20 and has appointment 08/18/20.

## 2020-02-23 NOTE — Assessment & Plan Note (Signed)
BPs stable and WNL, continue current regimen 

## 2020-02-23 NOTE — Assessment & Plan Note (Signed)
Recheck A1C, adjust as needed. Continue current regimen. 

## 2020-02-23 NOTE — Assessment & Plan Note (Signed)
Recheck PSA, continue doxazosin and close monitoring

## 2020-02-23 NOTE — Assessment & Plan Note (Signed)
Recheck lpids, adjust as needed. Continue good lifestyle habits for control in addition to current regimen

## 2020-02-23 NOTE — Assessment & Plan Note (Signed)
Stable, recheck lipids and continue current regimen. 

## 2020-03-02 ENCOUNTER — Telehealth: Payer: Self-pay | Admitting: Family Medicine

## 2020-03-02 NOTE — Telephone Encounter (Signed)
Left message for patient to call back and schedule the Medicare Annual Wellness Visit (AWV) virtually.  Last AWV 02/11/2019  Please schedule at anytime with [facility]-Nurse Health Advisor.  45 minute appointment

## 2020-03-03 DIAGNOSIS — M7062 Trochanteric bursitis, left hip: Secondary | ICD-10-CM | POA: Diagnosis not present

## 2020-03-03 DIAGNOSIS — M5126 Other intervertebral disc displacement, lumbar region: Secondary | ICD-10-CM | POA: Diagnosis not present

## 2020-03-03 DIAGNOSIS — M5416 Radiculopathy, lumbar region: Secondary | ICD-10-CM | POA: Diagnosis not present

## 2020-03-03 DIAGNOSIS — M5136 Other intervertebral disc degeneration, lumbar region: Secondary | ICD-10-CM | POA: Diagnosis not present

## 2020-03-31 DIAGNOSIS — R2681 Unsteadiness on feet: Secondary | ICD-10-CM | POA: Diagnosis not present

## 2020-03-31 DIAGNOSIS — M5416 Radiculopathy, lumbar region: Secondary | ICD-10-CM | POA: Diagnosis not present

## 2020-03-31 DIAGNOSIS — M6281 Muscle weakness (generalized): Secondary | ICD-10-CM | POA: Diagnosis not present

## 2020-03-31 DIAGNOSIS — M5126 Other intervertebral disc displacement, lumbar region: Secondary | ICD-10-CM | POA: Diagnosis not present

## 2020-04-01 ENCOUNTER — Ambulatory Visit: Payer: Medicare HMO

## 2020-04-01 ENCOUNTER — Telehealth: Payer: Self-pay

## 2020-04-01 NOTE — Telephone Encounter (Signed)
This nurse attempted to call patient in order to perform scheduled telephonic AWV. Called three times 11:10, 11:15, and 11:25. Message left for patient to call in order to reschedule appointment.

## 2020-04-05 ENCOUNTER — Telehealth: Payer: Self-pay

## 2020-04-05 ENCOUNTER — Telehealth: Payer: Self-pay | Admitting: Pharmacist

## 2020-04-05 DIAGNOSIS — M5416 Radiculopathy, lumbar region: Secondary | ICD-10-CM | POA: Diagnosis not present

## 2020-04-05 DIAGNOSIS — M5126 Other intervertebral disc displacement, lumbar region: Secondary | ICD-10-CM | POA: Diagnosis not present

## 2020-04-05 DIAGNOSIS — R2681 Unsteadiness on feet: Secondary | ICD-10-CM | POA: Diagnosis not present

## 2020-04-05 DIAGNOSIS — M6281 Muscle weakness (generalized): Secondary | ICD-10-CM | POA: Diagnosis not present

## 2020-04-05 NOTE — Progress Notes (Signed)
°  Chronic Care Management   Outreach Note  04/05/2020 Name: Ryan Kirby MRN: [id number] DOB: November 01, 1940  Referred by: Guadalupe Maple, MD Reason for referral : Chronic Care Management  An unsuccessful telephone outreach was attempted today. The patient was referred to the pharmacist for assistance with care management and care coordination.  Left HIPAA compliant message for him to return my call at his convenience.     Follow Up Plan: Will coordinate with care guides to reschedule in 4-6 weeks  Junita Push. Kenton Kingfisher PharmD, [facility] [phone]

## 2020-04-06 ENCOUNTER — Telehealth: Payer: Self-pay

## 2020-04-06 NOTE — Chronic Care Management (AMB) (Signed)
  Care Management   Note  04/06/2020 Name: [name] MRN: [id number] DOB: 12-Oct-1940  Ryan Kirby is a 79 y.o. year old male who is a primary care patient of [name], [name], MD and is actively engaged with the care management team. I reached out to [name] by phone today to assist with re-scheduling a follow up visit with the Pharmacist  Follow up plan: Unsuccessful telephone outreach attempt made. A HIPPA compliant phone message was left for the patient providing contact information and requesting a return call.  The care management team will reach out to the patient again over the next 7 days.  If patient returns call to provider office, please advise to call [facility] [facility] [place], [address], [place], [place] [address] Direct Dial: [phone] [email or web address]: [email or web address]

## 2020-04-07 DIAGNOSIS — M5416 Radiculopathy, lumbar region: Secondary | ICD-10-CM | POA: Diagnosis not present

## 2020-04-07 DIAGNOSIS — M6281 Muscle weakness (generalized): Secondary | ICD-10-CM | POA: Diagnosis not present

## 2020-04-07 DIAGNOSIS — M5126 Other intervertebral disc displacement, lumbar region: Secondary | ICD-10-CM | POA: Diagnosis not present

## 2020-04-07 DIAGNOSIS — R2681 Unsteadiness on feet: Secondary | ICD-10-CM | POA: Diagnosis not present

## 2020-04-11 DIAGNOSIS — R2681 Unsteadiness on feet: Secondary | ICD-10-CM | POA: Diagnosis not present

## 2020-04-11 DIAGNOSIS — M6281 Muscle weakness (generalized): Secondary | ICD-10-CM | POA: Diagnosis not present

## 2020-04-11 DIAGNOSIS — M5416 Radiculopathy, lumbar region: Secondary | ICD-10-CM | POA: Diagnosis not present

## 2020-04-11 DIAGNOSIS — M5126 Other intervertebral disc displacement, lumbar region: Secondary | ICD-10-CM | POA: Diagnosis not present

## 2020-04-11 NOTE — Telephone Encounter (Signed)
Pt has been r/s for 05/02/2020 

## 2020-04-12 ENCOUNTER — Encounter: Payer: Self-pay | Admitting: Family Medicine

## 2020-04-14 DIAGNOSIS — R2681 Unsteadiness on feet: Secondary | ICD-10-CM | POA: Diagnosis not present

## 2020-04-14 DIAGNOSIS — M6281 Muscle weakness (generalized): Secondary | ICD-10-CM | POA: Diagnosis not present

## 2020-04-14 DIAGNOSIS — M5416 Radiculopathy, lumbar region: Secondary | ICD-10-CM | POA: Diagnosis not present

## 2020-04-14 DIAGNOSIS — M5126 Other intervertebral disc displacement, lumbar region: Secondary | ICD-10-CM | POA: Diagnosis not present

## 2020-04-14 NOTE — Chronic Care Management (AMB) (Signed)
  Care Management   Note  04/14/2020 Name: DALTYN DEGROAT MRN: [id number] DOB: 07/03/1941  Ryan Kirby is a 79 y.o. year old male who is a primary care patient of [name], [name], MD and is actively engaged with the care management team. I reached out to [name] by phone today to assist with re-scheduling a follow up visit with the Pharmacist  Follow up plan: Telephone appointment with care management team member scheduled for:05/02/2020  Noreene Larsson, [place], [place], [place] [address] Direct Dial: [phone] Dearies [email or web address] Website: [email or web address]

## 2020-04-15 ENCOUNTER — Ambulatory Visit (INDEPENDENT_AMBULATORY_CARE_PROVIDER_SITE_OTHER): Payer: Medicare HMO

## 2020-04-15 VITALS — Ht 71.0 in | Wt 175.0 lb

## 2020-04-15 DIAGNOSIS — Z0000 Encounter for general adult medical examination without abnormal findings: Secondary | ICD-10-CM | POA: Diagnosis not present

## 2020-04-15 NOTE — Patient Instructions (Signed)
Ryan Kirby , Thank you for taking time to come for your Medicare Wellness Visit. I appreciate your ongoing commitment to your health goals. Please review the following plan we discussed and let me know if I can assist you in the future.   Screening recommendations/referrals: Colonoscopy: not required Recommended yearly ophthalmology/optometry visit for glaucoma screening and checkup Recommended yearly dental visit for hygiene and checkup  Vaccinations: Influenza vaccine: due Pneumococcal vaccine: completed 12/01/2015 Tdap vaccine: completed 07/02/2018 Shingles vaccine: discussed   Covid-19:  11/03/2019, 10/13/2019  Advanced directives: Advance directive discussed with you today.   Conditions/risks identified: none  Next appointment: Follow up in one year for your annual wellness visit.   Preventive Care 78 Years and Older, Male Preventive care refers to lifestyle choices and visits with your health care provider that can promote health and wellness. What does preventive care include?  A yearly physical exam. This is also called an annual well check.  Dental exams once or twice a year.  Routine eye exams. Ask your health care provider how often you should have your eyes checked.  Personal lifestyle choices, including:  Daily care of your teeth and gums.  Regular physical activity.  Eating a healthy diet.  Avoiding tobacco and drug use.  Limiting alcohol use.  Practicing safe sex.  Taking low doses of aspirin every day.  Taking vitamin and mineral supplements as recommended by your health care provider. What happens during an annual well check? The services and screenings done by your health care provider during your annual well check will depend on your age, overall health, lifestyle risk factors, and family history of disease. Counseling  Your health care provider may ask you questions about your:  Alcohol use.  Tobacco use.  Drug use.  Emotional  well-being.  Home and relationship well-being.  Sexual activity.  Eating habits.  History of falls.  Memory and ability to understand (cognition).  Work and work Statistician. Screening  You may have the following tests or measurements:  Height, weight, and BMI.  Blood pressure.  Lipid and cholesterol levels. These may be checked every 5 years, or more frequently if you are over 60 years old.  Skin check.  Lung cancer screening. You may have this screening every year starting at age 59 if you have a 30-pack-year history of smoking and currently smoke or have quit within the past 15 years.  Fecal occult blood test (FOBT) of the stool. You may have this test every year starting at age 23.  Flexible sigmoidoscopy or colonoscopy. You may have a sigmoidoscopy every 5 years or a colonoscopy every 10 years starting at age 68.  Prostate cancer screening. Recommendations will vary depending on your family history and other risks.  Hepatitis C blood test.  Hepatitis B blood test.  Sexually transmitted disease (STD) testing.  Diabetes screening. This is done by checking your blood sugar (glucose) after you have not eaten for a while (fasting). You may have this done every 1-3 years.  Abdominal aortic aneurysm (AAA) screening. You may need this if you are a current or former smoker.  Osteoporosis. You may be screened starting at age 95 if you are at high risk. Talk with your health care provider about your test results, treatment options, and if necessary, the need for more tests. Vaccines  Your health care provider may recommend certain vaccines, such as:  Influenza vaccine. This is recommended every year.  Tetanus, diphtheria, and acellular pertussis (Tdap, Td) vaccine. You may need a  Td booster every 10 years.  Zoster vaccine. You may need this after age 41.  Pneumococcal 13-valent conjugate (PCV13) vaccine. One dose is recommended after age 78.  Pneumococcal  polysaccharide (PPSV23) vaccine. One dose is recommended after age 81. Talk to your health care provider about which screenings and vaccines you need and how often you need them. This information is not intended to replace advice given to you by your health care provider. Make sure you discuss any questions you have with your health care provider. Document Released: 09/16/2015 Document Revised: 05/09/2016 Document Reviewed: 06/21/2015 Elsevier Interactive Patient Education  2017 [place] Prevention in the Home Falls can cause injuries. They can happen to people of all ages. There are many things you can do to make your home safe and to help prevent falls. What can I do on the outside of my home?  Regularly fix the edges of walkways and driveways and fix any cracks.  Remove anything that might make you trip as you walk through a door, such as a raised step or threshold.  Trim any bushes or trees on the path to your home.  Use bright outdoor lighting.  Clear any walking paths of anything that might make someone trip, such as rocks or tools.  Regularly check to see if handrails are loose or broken. Make sure that both sides of any steps have handrails.  Any raised decks and porches should have guardrails on the edges.  Have any leaves, snow, or ice cleared regularly.  Use sand or salt on walking paths during winter.  Clean up any spills in your garage right away. This includes oil or grease spills. What can I do in the bathroom?  Use night lights.  Install grab bars by the toilet and in the tub and shower. Do not use towel bars as grab bars.  Use non-skid mats or decals in the tub or shower.  If you need to sit down in the shower, use a plastic, non-slip stool.  Keep the floor dry. Clean up any water that spills on the floor as soon as it happens.  Remove soap buildup in the tub or shower regularly.  Attach bath mats securely with double-sided non-slip rug  tape.  Do not have throw rugs and other things on the floor that can make you trip. What can I do in the bedroom?  Use night lights.  Make sure that you have a light by your bed that is easy to reach.  Do not use any sheets or blankets that are too big for your bed. They should not hang down onto the floor.  Have a firm chair that has side arms. You can use this for support while you get dressed.  Do not have throw rugs and other things on the floor that can make you trip. What can I do in the kitchen?  Clean up any spills right away.  Avoid walking on wet floors.  Keep items that you use a lot in easy-to-reach places.  If you need to reach something above you, use a strong step stool that has a grab bar.  Keep electrical cords out of the way.  Do not use floor polish or wax that makes floors slippery. If you must use wax, use non-skid floor wax.  Do not have throw rugs and other things on the floor that can make you trip. What can I do with my stairs?  Do not leave any items on the stairs.  Make sure that there are handrails on both sides of the stairs and use them. Fix handrails that are broken or loose. Make sure that handrails are as long as the stairways.  Check any carpeting to make sure that it is firmly attached to the stairs. Fix any carpet that is loose or worn.  Avoid having throw rugs at the top or bottom of the stairs. If you do have throw rugs, attach them to the floor with carpet tape.  Make sure that you have a light switch at the top of the stairs and the bottom of the stairs. If you do not have them, ask someone to add them for you. What else can I do to help prevent falls?  Wear shoes that:  Do not have high heels.  Have rubber bottoms.  Are comfortable and fit you well.  Are closed at the toe. Do not wear sandals.  If you use a stepladder:  Make sure that it is fully opened. Do not climb a closed stepladder.  Make sure that both sides of the  stepladder are locked into place.  Ask someone to hold it for you, if possible.  Clearly mark and make sure that you can see:  Any grab bars or handrails.  First and last steps.  Where the edge of each step is.  Use tools that help you move around (mobility aids) if they are needed. These include:  Canes.  Walkers.  Scooters.  Crutches.  Turn on the lights when you go into a dark area. Replace any light bulbs as soon as they burn out.  Set up your furniture so you have a clear path. Avoid moving your furniture around.  If any of your floors are uneven, fix them.  If there are any pets around you, be aware of where they are.  Review your medicines with your doctor. Some medicines can make you feel dizzy. This can increase your chance of falling. Ask your doctor what other things that you can do to help prevent falls. This information is not intended to replace advice given to you by your health care provider. Make sure you discuss any questions you have with your health care provider. Document Released: 06/16/2009 Document Revised: 01/26/2016 Document Reviewed: 09/24/2014 Elsevier Interactive Patient Education  2017 [employer].

## 2020-04-15 NOTE — Progress Notes (Signed)
I connected with Ryan Kirby today by telephone and verified that I am speaking with the correct person using two identifiers. Location patient: home Location provider: work Persons participating in the virtual visit: Andreas Sobolewski, Glenna Durand LPN.   I discussed the limitations, risks, security and privacy concerns of performing an evaluation and management service by telephone and the availability of in person appointments. I also discussed with the patient that there may be a patient responsible charge related to this service. The patient expressed understanding and verbally consented to this telephonic visit.    Interactive audio and video telecommunications were attempted between this provider and patient, however failed, due to patient having technical difficulties OR patient did not have access to video capability.  We continued and completed visit with audio only.    Vital signs may be patient reported or missing.   Subjective:   Ryan Kirby is a 79 y.o. male who presents for Medicare Annual/Subsequent preventive examination.  Review of Systems     Cardiac Risk Factors include: advanced age (>15mn, >>15women);diabetes mellitus;dyslipidemia;hypertension;male gender     Objective:    Today's Vitals   04/15/20 0827  Weight: 175 lb (79.4 kg)  Height: '5\' 11"'  (1.803 m)   Body mass index is 24.41 kg/m.  Advanced Directives 04/15/2020 02/11/2019 11/03/2018 10/29/2018 12/16/2017 09/12/2015 09/02/2015  Does Patient Have a Medical Advance Directive? No No No No No Yes Yes  Type of Advance Directive - - - - - - Living will  Copy of [place] Chart? - - - - - No - copy requested No - copy requested  Would patient like information on creating a medical advance directive? - - - - Yes (MAU/Ambulatory/Procedural Areas - Information given) - -    Current Medications (verified) Outpatient Encounter Medications as of 04/15/2020  Medication Sig  . amLODipine (NORVASC)  10 MG tablet Take 1 tablet (10 mg total) by mouth daily.  .Marland Kitchenaspirin EC 81 MG tablet Take 81 mg by mouth daily.  . benazepril-hydrochlorthiazide (LOTENSIN HCT) 20-12.5 MG tablet Take 1 tablet by mouth daily.  . carvedilol (COREG) 25 MG tablet Take 2 tablets (50 mg total) by mouth 2 (two) times daily.  . cetirizine (ZYRTEC) 10 MG tablet Take 10 mg by mouth daily.  . cloNIDine (CATAPRES) 0.3 MG tablet Take 1 tablet (0.3 mg total) by mouth 2 (two) times daily.  .Marland Kitchendoxazosin (CARDURA) 2 MG tablet Take 1 tablet (2 mg total) by mouth every morning.  . empagliflozin (JARDIANCE) 25 MG TABS tablet Take 25 mg by mouth daily before breakfast.  . esomeprazole (NEXIUM) 20 MG capsule Take 20 mg by mouth daily. Reported on 09/12/2015  . furosemide (LASIX) 40 MG tablet Take 1 tablet (40 mg total) by mouth daily.  .Marland KitchenglipiZIDE (GLUCOTROL) 5 MG tablet Take 1 tablet (5 mg total) by mouth daily before breakfast.  . metFORMIN (GLUCOPHAGE) 500 MG tablet Take 1 tablet (500 mg total) by mouth 2 (two) times daily.  . potassium chloride SA (KLOR-CON) 20 MEQ tablet Take 1 tablet (20 mEq total) by mouth daily.  . pravastatin (PRAVACHOL) 40 MG tablet Take 1 tablet (40 mg total) by mouth at bedtime.  . senna (SENOKOT) 8.6 MG TABS tablet Take 2 tablets (17.2 mg total) by mouth at bedtime as needed for mild constipation.   No facility-administered encounter medications on file as of 04/15/2020.    Allergies (verified) Oxycontin [oxycodone]   History: Past Medical History:  Diagnosis Date  .  Allergy   . CAD (coronary artery disease)   . Chronic kidney disease    pt is not on dialysis  . Diabetes mellitus without complication ([place])   . GERD (gastroesophageal reflux disease)   . Hyperlipidemia    Past Surgical History:  Procedure Laterality Date  . BOWEL RESECTION  2004  . CORONARY ARTERY BYPASS GRAFT    . FINGER AMPUTATION Left 2011   5th  . KNEE ARTHROSCOPY Right 09/12/2015   Procedure: ARTHROSCOPY KNEE, LATERAL  MENISECTOMY, CHONDROPLASTY;  Surgeon: Dereck Leep, MD;  Location: [facility] ORS;  Service: Orthopedics;  Laterality: Right;   Family History  Problem Relation Age of Onset  . Arthritis Mother   . Hypertension Mother   . Breast cancer Mother   . Arthritis Father   . Hypertension Father   . Colon cancer Father    Social History   Socioeconomic History  . Marital status: Single    Spouse name: Not on file  . Number of children: Not on file  . Years of education: Not on file  . Highest education level: 12th grade  Occupational History  . Occupation: retired  Tobacco Use  . Smoking status: Never Smoker  . Smokeless tobacco: Never Used  Vaping Use  . Vaping Use: Never used  Substance and Sexual Activity  . Alcohol use: No    Alcohol/week: 0.0 standard drinks  . Drug use: No  . Sexual activity: Not on file  Other Topics Concern  . Not on file  Social History Narrative  . Not on file   Social Determinants of Health   Financial Resource Strain: Low Risk   . Difficulty of Paying Living Expenses: Not hard at all  Food Insecurity: No Food Insecurity  . Worried About Charity fundraiser in the Last Year: Never true  . Ran Out of Food in the Last Year: Never true  Transportation Needs: No Transportation Needs  . Lack of Transportation (Medical): No  . Lack of Transportation (Non-Medical): No  Physical Activity: Insufficiently Active  . Days of Exercise per Week: 2 days  . Minutes of Exercise per Session: 50 min  Stress: No Stress Concern Present  . Feeling of Stress : Not at all  Social Connections:   . Frequency of Communication with Friends and Family:   . Frequency of Social Gatherings with Friends and Family:   . Attends Religious Services:   . Active Member of Clubs or Organizations:   . Attends Archivist Meetings:   Marland Kitchen Marital Status:     Tobacco Counseling Counseling given: Not Answered   Clinical Intake:  Pre-visit preparation completed: Yes  Pain  : No/denies pain     Nutritional Status: BMI of 19-24  Normal Nutritional Risks: None Diabetes: Yes  How often do you need to have someone help you when you read instructions, pamphlets, or other written materials from your doctor or pharmacy?: 1 - Never What is the last grade level you completed in school?: 12th grade  Diabetic? Yes Nutrition Risk Assessment:  Has the patient had any N/V/D within the last 2 months?  No  Does the patient have any non-healing wounds?  No  Has the patient had any unintentional weight loss or weight gain?  No   Diabetes:  Is the patient diabetic?  Yes  If diabetic, was a CBG obtained today?  No  Did the patient bring in their glucometer from home?  No  How often do you monitor your CBG's?  Twice weekly.   Financial Strains and Diabetes Management:  Are you having any financial strains with the device, your supplies or your medication? No .  Does the patient want to be seen by Chronic Care Management for management of their diabetes?  No  Would the patient like to be referred to a Nutritionist or for Diabetic Management?  No   Diabetic Exams:  Diabetic Eye Exam: Overdue for diabetic eye exam. Pt has been advised about the importance in completing this exam. Patient advised to call and schedule an eye exam. Diabetic Foot Exam: Overdue, Pt has been advised about the importance in completing this exam. Pt is scheduled for diabetic foot exam on next appointment.   Interpreter Needed?: No  Information entered by :: NAllen lPN   Activities of Daily Living In your present state of health, do you have any difficulty performing the following activities: 04/15/2020 02/17/2020  Hearing? N N  Vision? N N  Difficulty concentrating or making decisions? N N  Walking or climbing stairs? N N  Dressing or bathing? N N  Doing errands, shopping? N N  Preparing Food and eating ? N -  Using the Toilet? N -  In the past six months, have you accidently leaked  urine? Y -  Comment only if held too long -  Do you have problems with loss of bowel control? N -  Managing your Medications? N -  Managing your Finances? N -  Some recent data might be hidden    Patient Care Team: Guadalupe Maple, MD as PCP - General (Family Medicine) Guadalupe Maple, MD as PCP - Family Medicine (Family Medicine) Garrel Ridgel, DPM as Consulting Physician (Podiatry) De Hollingshead, [facility] as Pharmacist (Pharmacist) De Hollingshead, [facility] (Pharmacist) Minor, Dalbert Garnet, RN (Inactive) as [place] Management Vladimir Faster, [facility] as Pharmacist (Pharmacist)  Indicate any recent Medical Services you may have received from other than [facility] providers in the past year (date may be approximate).     Assessment:   This is a routine wellness examination for [place].  Hearing/Vision screen  Hearing Screening   '125Hz'  '250Hz'  '500Hz'  '1000Hz'  '2000Hz'  '3000Hz'  '4000Hz'  '6000Hz'  '8000Hz'   Right ear:           Left ear:           Vision Screening Comments: Regular eye exams,   Dietary issues and exercise activities discussed: Current Exercise Habits: Structured exercise class, Type of exercise: stretching;strength training/weights, Time (Minutes): 45, Frequency (Times/Week): 2, Weekly Exercise (Minutes/Week): 90  Goals    .  DIET - INCREASE WATER INTAKE      recommend drinking at least 6-8 glasses of water a day.    .  Patient Stated      04/15/2020, stay healthy    .  PharmD "I can't afford my Jardiance" (pt-stated)      Current Barriers:  . Diabetes: uncontrolled, most recent K0U 5.4%; complicated w/ CKD, HTN, CAD o Does report some constipation. Has been taking senna/docusate at bedtime for the past 3 days. Took Miralax this morning.  . Last eGFR 53 mL/min . Current antihyperglycemic regimen: metformin 500 mg BID, glipizide 5 mg QAM, Jardiance 25 mg daily  o Denies having been on higher doses of metformin o APPROVED for Jardiance assistance through [employer]	[place] through 09/02/2020 . Current blood sugar readings:   Fasting  5-Apr 140  6-Apr 181  7-Apr 152  8-Apr 163  10-Apr 127  12-Apr 147  20-Apr 135  24-Apr 116  26-Apr 188  2-May 129  5-May 140  9-May 131  15-May 161  19-May 137  23-May 118   144   . Cardiovascular risk reduction: o Current hypertensive regimen: amlodipine 10 mg QAM, benazepril/HCTZ 20/12.5 mg QAM, carvedilol 25 mg BID, clonidine 0.3 mg BID, doxazosin 2 mg QAM, furosemide 40 mg QAM - Is not checking BP at home, does NOT have a BP machine o Current hyperlipidemia regimen: pravastatin 40 mg daily; last LDL right at goal of 70; o Current antiplatelet regimen: ASA 81 mg daily   Pharmacist Clinical Goal(s):  Marland Kitchen Over the next 90 days, patient will work with PharmD and primary care provider to address optimized medication management  Interventions: . Comprehensive medication review performed, medication list updated in electronic medical record . Inter-disciplinary care team collaboration (see longitudinal plan of care) . Reviewed goal A1c, goal fasting, and goal 2 hour post prandial glucose readings.  . Discussed potential to increase metformin. Pending A1c, recommend maximizing metformin to 1000 mg BID . Reviewed importance of home BP monitoring, given multiple antihypertensives. Denies any s/sx lightheadedness or dizziness. He notes issues w/ constipation - this may be exacerbated by anticholergic potential of clonidine. Pending BP, consider dose decrease of clonidine (0.2 mg BID). If compensation needed, can increase benazepril/HCTZ dose . Mailing patient information about investigating OTC benefits for BP machine  Patient Self Care Activities:  . Patient will check blood glucose daily , document, and provide at future appointments . Patient will take medications as prescribed . Patient will report any questions or concerns to provider   Please see past updates related to this goal by clicking on the "Past  Updates" button in the selected goal        Depression Screen PHQ 2/9 Scores 04/15/2020 02/17/2020 02/11/2019 12/16/2017 09/23/2017 06/24/2017 03/13/2017  PHQ - 2 Score 0 '1 1 2 ' 0 0 0  PHQ- 9 Score - '2 3 6 ' - - -    Fall Risk Fall Risk  04/15/2020 02/17/2020 08/17/2019 02/16/2019 02/11/2019  Falls in the past year? 0 1 1 0 1  Number falls in past yr: - 1 1 - 1  Injury with Fall? - 0 0 - 0  Risk for fall due to : Medication side effect;Impaired balance/gait - History of fall(s);Impaired balance/gait - -  Risk for fall due to: Comment has physical therapy - - - -  Follow up Falls evaluation completed;Education provided;Falls prevention discussed - Falls evaluation completed;Education provided;Falls prevention discussed Falls evaluation completed;Falls prevention discussed -    Any stairs in or around the home? Yes  If so, are there any without handrails? No  Home free of loose throw rugs in walkways, pet beds, electrical cords, etc? Yes  Adequate lighting in your home to reduce risk of falls? Yes   ASSISTIVE DEVICES UTILIZED TO PREVENT FALLS:  Life alert? No  Use of a cane, walker or w/c? No  Grab bars in the bathroom? No  Shower chair or bench in shower? No  Elevated toilet seat or a handicapped toilet? No   TIMED UP AND GO:  Was the test performed? No .  Cognitive Function:     6CIT Screen 04/15/2020 02/11/2019 12/16/2017  What Year? 0 points 0 points 0 points  What month? 0 points 0 points 0 points  What time? 0 points 0 points 0 points  Count back from 20 0 points 0 points 0 points  Months in reverse 0 points 0 points  0 points  Repeat phrase 0 points 0 points 0 points  Total Score 0 0 0    Immunizations Immunization History  Administered Date(s) Administered  . Fluad Quad(high Dose 65+) 07/27/2019  . Influenza, High Dose Seasonal PF 07/17/2016, 06/24/2017, 07/02/2018  . Influenza,inj,Quad PF,6+ Mos 06/02/2015  . PFIZER SARS-COV-2 Vaccination 10/13/2019, 11/03/2019  .  Pneumococcal Conjugate-13 12/01/2015  . Pneumococcal-Unspecified 07/01/2007  . Td 05/25/2008, 07/02/2018    TDAP status: Up to date Flu Vaccine status: Up to date Pneumococcal vaccine status: Up to date Covid-19 vaccine status: Completed vaccines  Qualifies for Shingles Vaccine? Yes   Zostavax completed No   Shingrix Completed?: No.    Education has been provided regarding the importance of this vaccine. Patient has been advised to call insurance company to determine out of pocket expense if they have not yet received this vaccine. Advised may also receive vaccine at local pharmacy or Health Dept. Verbalized acceptance and understanding.  Screening Tests Health Maintenance  Topic Date Due  . INFLUENZA VACCINE  04/03/2020  . COLONOSCOPY  02/16/2021 (Originally 08/21/2018)  . HEMOGLOBIN A1C  08/18/2020  . OPHTHALMOLOGY EXAM  02/09/2021  . FOOT EXAM  02/16/2021  . TETANUS/TDAP  07/02/2028  . COVID-19 Vaccine  Completed  . Hepatitis C Screening  Completed  . PNA vac Low Risk Adult  Completed    Health Maintenance  Health Maintenance Due  Topic Date Due  . INFLUENZA VACCINE  04/03/2020    Colorectal cancer screening: No longer required.   Lung Cancer Screening: (Low Dose CT Chest recommended if Age 65-80 years, 30 pack-year currently smoking OR have quit w/in 15years.) does not qualify.   Lung Cancer Screening Referral: no  Additional Screening:  Hepatitis C Screening: does qualify; Completed 02/17/2020  Vision Screening: Recommended annual ophthalmology exams for early detection of glaucoma and other disorders of the eye. Is the patient up to date with their annual eye exam?  No  Who is the provider or what is the name of the office in which the patient attends annual eye exams? Does not remember name If pt is not established with a provider, would they like to be referred to a provider to establish care? No .   Dental Screening: Recommended annual dental exams for proper  oral hygiene  Community Resource Referral / Chronic Care Management: CRR required this visit?  No   CCM required this visit?  No      Plan:     I have personally reviewed and noted the following in the patient's chart:   . Medical and social history . Use of alcohol, tobacco or illicit drugs  . Current medications and supplements . Functional ability and status . Nutritional status . Physical activity . Advanced directives . List of other physicians . Hospitalizations, surgeries, and ER visits in previous 12 months . Vitals . Screenings to include cognitive, depression, and falls . Referrals and appointments  In addition, I have reviewed and discussed with patient certain preventive protocols, quality metrics, and best practice recommendations. A written personalized care plan for preventive services as well as general preventive health recommendations were provided to patient.   Due to this being a telephonic visit, the after visit summary with patients personalized plan was offered to patient via mail or my-chart. Patient preferred to pick up at office at next visit.   Kellie Simmering, LPN   11/11/2160   Nurse Notes:

## 2020-04-18 ENCOUNTER — Telehealth: Payer: Medicare HMO

## 2020-04-19 DIAGNOSIS — M5416 Radiculopathy, lumbar region: Secondary | ICD-10-CM | POA: Diagnosis not present

## 2020-04-19 DIAGNOSIS — M6281 Muscle weakness (generalized): Secondary | ICD-10-CM | POA: Diagnosis not present

## 2020-04-19 DIAGNOSIS — M5126 Other intervertebral disc displacement, lumbar region: Secondary | ICD-10-CM | POA: Diagnosis not present

## 2020-04-19 DIAGNOSIS — R2681 Unsteadiness on feet: Secondary | ICD-10-CM | POA: Diagnosis not present

## 2020-04-21 DIAGNOSIS — M6281 Muscle weakness (generalized): Secondary | ICD-10-CM | POA: Diagnosis not present

## 2020-04-21 DIAGNOSIS — R2681 Unsteadiness on feet: Secondary | ICD-10-CM | POA: Diagnosis not present

## 2020-04-21 DIAGNOSIS — M5416 Radiculopathy, lumbar region: Secondary | ICD-10-CM | POA: Diagnosis not present

## 2020-04-21 DIAGNOSIS — M5126 Other intervertebral disc displacement, lumbar region: Secondary | ICD-10-CM | POA: Diagnosis not present

## 2020-04-26 DIAGNOSIS — M5126 Other intervertebral disc displacement, lumbar region: Secondary | ICD-10-CM | POA: Diagnosis not present

## 2020-04-26 DIAGNOSIS — M6281 Muscle weakness (generalized): Secondary | ICD-10-CM | POA: Diagnosis not present

## 2020-04-26 DIAGNOSIS — M5416 Radiculopathy, lumbar region: Secondary | ICD-10-CM | POA: Diagnosis not present

## 2020-04-26 DIAGNOSIS — R2681 Unsteadiness on feet: Secondary | ICD-10-CM | POA: Diagnosis not present

## 2020-04-27 ENCOUNTER — Telehealth: Payer: Medicare HMO

## 2020-04-28 DIAGNOSIS — R2681 Unsteadiness on feet: Secondary | ICD-10-CM | POA: Diagnosis not present

## 2020-04-28 DIAGNOSIS — M6281 Muscle weakness (generalized): Secondary | ICD-10-CM | POA: Diagnosis not present

## 2020-04-28 DIAGNOSIS — M5126 Other intervertebral disc displacement, lumbar region: Secondary | ICD-10-CM | POA: Diagnosis not present

## 2020-04-28 DIAGNOSIS — M5416 Radiculopathy, lumbar region: Secondary | ICD-10-CM | POA: Diagnosis not present

## 2020-05-02 ENCOUNTER — Telehealth: Payer: Medicare HMO

## 2020-05-03 DIAGNOSIS — M6281 Muscle weakness (generalized): Secondary | ICD-10-CM | POA: Diagnosis not present

## 2020-05-03 DIAGNOSIS — R2681 Unsteadiness on feet: Secondary | ICD-10-CM | POA: Diagnosis not present

## 2020-05-03 DIAGNOSIS — M5126 Other intervertebral disc displacement, lumbar region: Secondary | ICD-10-CM | POA: Diagnosis not present

## 2020-05-03 DIAGNOSIS — M5416 Radiculopathy, lumbar region: Secondary | ICD-10-CM | POA: Diagnosis not present

## 2020-05-05 DIAGNOSIS — M48062 Spinal stenosis, lumbar region with neurogenic claudication: Secondary | ICD-10-CM | POA: Diagnosis not present

## 2020-05-05 DIAGNOSIS — M5126 Other intervertebral disc displacement, lumbar region: Secondary | ICD-10-CM | POA: Diagnosis not present

## 2020-05-05 DIAGNOSIS — M5416 Radiculopathy, lumbar region: Secondary | ICD-10-CM | POA: Diagnosis not present

## 2020-05-05 DIAGNOSIS — M7062 Trochanteric bursitis, left hip: Secondary | ICD-10-CM | POA: Diagnosis not present

## 2020-05-05 DIAGNOSIS — M6281 Muscle weakness (generalized): Secondary | ICD-10-CM | POA: Diagnosis not present

## 2020-05-05 DIAGNOSIS — R2681 Unsteadiness on feet: Secondary | ICD-10-CM | POA: Diagnosis not present

## 2020-05-05 DIAGNOSIS — M5136 Other intervertebral disc degeneration, lumbar region: Secondary | ICD-10-CM | POA: Diagnosis not present

## 2020-05-10 DIAGNOSIS — M5416 Radiculopathy, lumbar region: Secondary | ICD-10-CM | POA: Diagnosis not present

## 2020-05-10 DIAGNOSIS — M6281 Muscle weakness (generalized): Secondary | ICD-10-CM | POA: Diagnosis not present

## 2020-05-10 DIAGNOSIS — M5126 Other intervertebral disc displacement, lumbar region: Secondary | ICD-10-CM | POA: Diagnosis not present

## 2020-05-10 DIAGNOSIS — R2681 Unsteadiness on feet: Secondary | ICD-10-CM | POA: Diagnosis not present

## 2020-05-12 ENCOUNTER — Other Ambulatory Visit: Payer: Self-pay

## 2020-05-12 ENCOUNTER — Encounter: Payer: Self-pay | Admitting: Nurse Practitioner

## 2020-05-12 ENCOUNTER — Ambulatory Visit (INDEPENDENT_AMBULATORY_CARE_PROVIDER_SITE_OTHER): Payer: Medicare HMO | Admitting: Nurse Practitioner

## 2020-05-12 ENCOUNTER — Ambulatory Visit: Payer: Medicare HMO | Admitting: Family Medicine

## 2020-05-12 VITALS — BP 150/76 | HR 79 | Temp 97.7°F | Wt 180.4 lb

## 2020-05-12 DIAGNOSIS — K4091 Unilateral inguinal hernia, without obstruction or gangrene, recurrent: Secondary | ICD-10-CM | POA: Insufficient documentation

## 2020-05-12 DIAGNOSIS — K4090 Unilateral inguinal hernia, without obstruction or gangrene, not specified as recurrent: Secondary | ICD-10-CM

## 2020-05-12 NOTE — Patient Instructions (Signed)
Hernia, Adult     A hernia is the bulging of an organ or tissue through a weak spot in the muscles of the abdomen (abdominal wall). Hernias develop most often near the belly button (navel) or the area where the leg meets the lower abdomen (groin). Common types of hernias include:  Incisional hernia. This type bulges through a scar from an abdominal surgery.  Umbilical hernia. This type develops near the navel.  Inguinal hernia. This type develops in the groin or scrotum.  Femoral hernia. This type develops under the groin, in the upper thigh area.  Hiatal hernia. This type occurs when part of the stomach slides above the muscle that separates the abdomen from the chest (diaphragm). What are the causes? This condition may be caused by:  Heavy lifting.  Coughing over a long period of time.  Straining to have a bowel movement. Constipation can lead to straining.  An incision made during an abdominal surgery.  A physical problem that is present at birth (congenital defect).  Being overweight or obese.  Smoking.  Excess fluid in the abdomen.  Undescended testicles in males. What are the signs or symptoms? The main symptom is a skin-colored, rounded bulge in the area of the hernia. However, a bulge may not always be present. It may grow bigger or be more visible when you cough or strain (such as when lifting something heavy). A hernia that can be pushed back into the area (is reducible) rarely causes pain. A hernia that cannot be pushed back into the area (is incarcerated) may lose its blood supply (become strangulated). A hernia that is incarcerated may cause:  Pain.  Fever.  Nausea and vomiting.  Swelling.  Constipation. How is this diagnosed? A hernia may be diagnosed based on:  Your symptoms and medical history.  A physical exam. Your health care provider may ask you to cough or move in certain ways to see if the hernia becomes visible.  Imaging tests, such  as: ? X-rays. ? Ultrasound. ? CT scan. How is this treated? A hernia that is small and painless may not need to be treated. A hernia that is large or painful may be treated with surgery. Inguinal hernias may be treated with surgery to prevent incarceration or strangulation. Strangulated hernias are always treated with surgery because a lack of blood supply to the trapped organ or tissue can cause it to die. Surgery to treat a hernia involves pushing the bulge back into place and repairing the weak area of the muscle or abdominal wall. Follow these instructions at home: Activity  Avoid straining.  Do not lift anything that is heavier than 10 lb (4.5 kg), or the limit that you are told, until your health care provider says that it is safe.  When lifting heavy objects, lift with your leg muscles, not your back muscles. Preventing constipation  Take actions to prevent constipation. Constipation leads to straining with bowel movements, which can make a hernia worse or cause a hernia repair to break down. Your health care provider may recommend that you: ? Drink enough fluid to keep your urine pale yellow. ? Eat foods that are high in fiber, such as fresh fruits and vegetables, whole grains, and beans. ? Limit foods that are high in fat and processed sugars, such as fried or sweet foods. ? Take an over-the-counter or prescription medicine for constipation. General instructions  When coughing, try to cough gently.  You may try to push the hernia back in place   by very gently pressing on it while lying down. Do not try to force the bulge back in if it will not push in easily.  If you are overweight, work with your health care provider to lose weight safely.  Do not use any products that contain nicotine or tobacco, such as cigarettes and e-cigarettes. If you need help quitting, ask your health care provider.  If you are scheduled for hernia repair, watch your hernia for any changes in shape,  size, or color. Tell your health care provider about any changes or new symptoms.  Take over-the-counter and prescription medicines only as told by your health care provider.  Keep all follow-up visits as told by your health care provider. This is important. Contact a health care provider if:  You develop new pain, swelling, or redness around your hernia.  You have signs of constipation, such as: ? Fewer bowel movements in a week than normal. ? Difficulty having a bowel movement. ? Stools that are dry, hard, or larger than normal. Get help right away if:  You have a fever.  You have abdomen pain that gets worse.  You feel nauseous or you vomit.  You cannot push the hernia back in place by very gently pressing on it while lying down. Do not try to force the bulge back in if it will not push in easily.  The hernia: ? Changes in shape, size, or color. ? Feels hard or tender. These symptoms may represent a serious problem that is an emergency. Do not wait to see if the symptoms will go away. Get medical help right away. Call your local emergency services (911 in the [place] Summary  A hernia is the bulging of an organ or tissue through a weak spot in the muscles of the abdomen (abdominal wall).  The main symptom is a skin-colored, rounded lump (bulge) in the hernia area. However, a bulge may not always be present. It may grow bigger or more visible when you cough or strain (such as when having a bowel movement).  A hernia that is small and painless may not need to be treated. A hernia that is large or painful may be treated with surgery.  Surgery to treat a hernia involves pushing the bulge back into place and repairing the weak part of the abdomen. This information is not intended to replace advice given to you by your health care provider. Make sure you discuss any questions you have with your health care provider. Document Revised: 12/11/2018 Document Reviewed: 05/22/2017 Elsevier  Patient Education  2020 [employer]  

## 2020-05-12 NOTE — Progress Notes (Signed)
BP (!) 150/76 (BP Location: Left Arm, Patient Position: Sitting, Cuff Size: Normal)    Pulse 79    Temp 97.7 F (36.5 C) (Oral)    Wt 180 lb 6.4 oz (81.8 kg)    SpO2 99%    BMI 25.16 kg/m    Subjective:    Patient ID: Ryan Kirby, male    DOB: 1941-03-21, 79 y.o.   MRN: [id number]  HPI: Ryan Kirby is a 79 y.o. male  Chief Complaint  Patient presents with   Hernia    rupture - right side below belt   HERNIA First noticed hernia 2-3 months ago.   Duration: months Location: right groin Painful: no Discomfort: yes - if up all day and moving Bulge: yes; "hard around it" does not know if it is all of the time or comes and goes Quality:  sore Onset: sudden Severity: mild Context: getting worse Aggravating factors: being on the tractor all day, worse at end of day after being up and down all day and being on feet Alleviating factors: "sitting around all day"  Allergies  Allergen Reactions   Oxycontin [Oxycodone] Nausea And Vomiting   Outpatient Encounter Medications as of 05/12/2020  Medication Sig Note   amLODipine (NORVASC) 10 MG tablet Take 1 tablet (10 mg total) by mouth daily.    aspirin EC 81 MG tablet Take 81 mg by mouth daily.    benazepril-hydrochlorthiazide (LOTENSIN HCT) 20-12.5 MG tablet Take 1 tablet by mouth daily.    carvedilol (COREG) 25 MG tablet Take 2 tablets (50 mg total) by mouth 2 (two) times daily.    cetirizine (ZYRTEC) 10 MG tablet Take 10 mg by mouth daily. 08/13/2019: As needed   cloNIDine (CATAPRES) 0.3 MG tablet Take 1 tablet (0.3 mg total) by mouth 2 (two) times daily.    docusate sodium (COLACE) 100 MG capsule     doxazosin (CARDURA) 2 MG tablet Take 1 tablet (2 mg total) by mouth every morning.    empagliflozin (JARDIANCE) 25 MG TABS tablet Take 25 mg by mouth daily before breakfast.    esomeprazole (NEXIUM) 20 MG capsule Take 20 mg by mouth daily. Reported on 09/12/2015 12/01/2015: Sometimes takes Nexium sometimes Prilosec    furosemide (LASIX) 40 MG tablet Take 1 tablet (40 mg total) by mouth daily.    glipiZIDE (GLUCOTROL) 5 MG tablet Take 1 tablet (5 mg total) by mouth daily before breakfast.    metFORMIN (GLUCOPHAGE) 500 MG tablet Take 1 tablet (500 mg total) by mouth 2 (two) times daily.    potassium chloride SA (KLOR-CON) 20 MEQ tablet Take 1 tablet (20 mEq total) by mouth daily.    pravastatin (PRAVACHOL) 40 MG tablet Take 1 tablet (40 mg total) by mouth at bedtime.    senna (SENOKOT) 8.6 MG TABS tablet Take 2 tablets (17.2 mg total) by mouth at bedtime as needed for mild constipation.    No facility-administered encounter medications on file as of 05/12/2020.   Patient Active Problem List   Diagnosis Date Noted   Non-recurrent unilateral inguinal hernia without obstruction or gangrene 05/12/2020   Hip pain 09/22/2018   CKD stage 3 secondary to diabetes ([place]) 07/02/2018   Advanced care planning/counseling discussion 12/24/2017   GERD (gastroesophageal reflux disease) 12/06/2016   BPH (benign prostatic hyperplasia) 12/06/2016   DM type 2 causing CKD stage 3 ([place]) 06/02/2015   Hyperlipidemia associated with type 2 diabetes mellitus ([place])    Parenchymal renal hypertension    CAD (coronary  artery disease)    Heart murmur 03/04/2014   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   Intermittent alternating exotropia 12/17/2012   Preglaucoma 12/17/2012   Past Medical History:  Diagnosis Date   Allergy    CAD (coronary artery disease)    Chronic kidney disease    pt is not on dialysis   Diabetes mellitus without complication (HCC)    GERD (gastroesophageal reflux disease)    Hyperlipidemia    Relevant past medical, surgical, family and social history reviewed and updated as indicated. Interim medical history since our last visit reviewed.  Review of Systems  Constitutional: Negative.  Negative for activity change, appetite change, fatigue and fever.  Gastrointestinal: Positive for  abdominal distention. Negative for abdominal pain, constipation, diarrhea, nausea and vomiting.  Musculoskeletal: Negative.   Skin: Negative.   Hematological: Negative.   Psychiatric/Behavioral: Negative.     Per HPI unless specifically indicated above     Objective:    BP (!) 150/76 (BP Location: Left Arm, Patient Position: Sitting, Cuff Size: Normal)    Pulse 79    Temp 97.7 F (36.5 C) (Oral)    Wt 180 lb 6.4 oz (81.8 kg)    SpO2 99%    BMI 25.16 kg/m   Wt Readings from Last 3 Encounters:  05/12/20 180 lb 6.4 oz (81.8 kg)  04/15/20 175 lb (79.4 kg)  02/17/20 175 lb (79.4 kg)    Physical Exam Vitals and nursing note reviewed.  Constitutional:      General: He is not in acute distress.    Appearance: Normal appearance. He is not toxic-appearing.  Abdominal:     General: Bowel sounds are normal. There is no distension.     Palpations: Abdomen is soft.     Tenderness: There is no abdominal tenderness. There is no right CVA tenderness, left CVA tenderness, guarding or rebound.     Hernia: A hernia is present. Hernia is present in the right inguinal area.  Musculoskeletal:        General: Normal range of motion.     Right lower leg: No edema.     Left lower leg: No edema.  Skin:    General: Skin is warm and dry.     Coloration: Skin is not jaundiced or pale.     Findings: No bruising, erythema or rash.  Neurological:     General: No focal deficit present.     Mental Status: He is alert and oriented to person, place, and time.     Motor: No weakness.     Gait: Gait normal.  Psychiatric:        Mood and Affect: Mood normal.        Behavior: Behavior normal.        Thought Content: Thought content normal.        Judgment: Judgment normal.     Results for orders placed or performed in visit on 02/17/20  CBC with Differential/Platelet  Result Value Ref Range   WBC 4.5 3.4 - 10.8 x10E3/uL   RBC 4.59 4.14 - 5.80 x10E6/uL   Hemoglobin 14.2 13.0 - 17.7 g/dL   Hematocrit  42.8 37.5 - 51.0 %   MCV 93 79 - 97 fL   MCH 30.9 26.6 - 33.0 pg   MCHC 33.2 31 - 35 g/dL   RDW 12.6 11.6 - 15.4 %   Platelets 186 150 - 450 x10E3/uL   Neutrophils 63 Not Estab. %   Lymphs 20 Not Estab. %  Monocytes 9 Not Estab. %   Eos 6 Not Estab. %   Basos 2 Not Estab. %   Neutrophils Absolute 2.9 1 - 7 x10E3/uL   Lymphocytes Absolute 0.9 0 - 3 x10E3/uL   Monocytes Absolute 0.4 0 - 0 x10E3/uL   EOS (ABSOLUTE) 0.3 0.0 - 0.4 x10E3/uL   Basophils Absolute 0.1 0 - 0 x10E3/uL   Immature Granulocytes 0 Not Estab. %   Immature Grans (Abs) 0.0 0.0 - 0.1 x10E3/uL  Comprehensive metabolic panel  Result Value Ref Range   Glucose 126 (H) 65 - 99 mg/dL   BUN 18 8 - 27 mg/dL   Creatinine, Ser 1.20 0.76 - 1.27 mg/dL   GFR calc non Af Amer 58 (L) >59 mL/min/1.73   GFR calc Af Amer 67 >59 mL/min/1.73   BUN/Creatinine Ratio 15 10 - 24   Sodium 140 134 - 144 mmol/L   Potassium 3.4 (L) 3.5 - 5.2 mmol/L   Chloride 100 96 - 106 mmol/L   CO2 25 20 - 29 mmol/L   Calcium 10.2 8.6 - 10.2 mg/dL   Total Protein 6.4 6.0 - 8.5 g/dL   Albumin 4.2 3.7 - 4.7 g/dL   Globulin, Total 2.2 1.5 - 4.5 g/dL   Albumin/Globulin Ratio 1.9 1.2 - 2.2   Bilirubin Total 0.5 0.0 - 1.2 mg/dL   Alkaline Phosphatase 81 48 - 121 IU/L   AST 18 0 - 40 IU/L   ALT 5 0 - 44 IU/L  Lipid Panel w/o Chol/HDL Ratio  Result Value Ref Range   Cholesterol, Total 123 100 - 199 mg/dL   Triglycerides 107 0 - 149 mg/dL   HDL 48 >39 mg/dL   VLDL Cholesterol Cal 20 5 - 40 mg/dL   LDL Chol Calc (NIH) 55 0 - 99 mg/dL  PSA  Result Value Ref Range   Prostate Specific Ag, Serum 1.0 0.0 - 4.0 ng/mL  UA/M w/rflx Culture, Routine   Specimen: Blood   BLD  Result Value Ref Range   Specific Gravity, UA 1.015 1.005 - 1.030   pH, UA 7.0 5.0 - 7.5   Color, UA Yellow Yellow   Appearance Ur Clear Clear   Leukocytes,UA Negative Negative   Protein,UA Negative Negative/Trace   Glucose, UA 3+ (A) Negative   Ketones, UA Negative Negative    RBC, UA Negative Negative   Bilirubin, UA Negative Negative   Urobilinogen, Ur 0.2 0.2 - 1.0 mg/dL   Nitrite, UA Negative Negative  HgB A1c  Result Value Ref Range   Hgb A1c MFr Bld 6.3 (H) 4.8 - 5.6 %   Est. average glucose Bld gHb Est-mCnc 134 mg/dL  Hepatitis C antibody  Result Value Ref Range   Hep C Virus Ab <0.1 0.0 - 0.9 s/co ratio      Assessment & Plan:   Problem List Items Addressed This Visit      Other   Non-recurrent unilateral inguinal hernia without obstruction or gangrene - Primary    Acute, ongoing.  Non-painful, no issues with constipation or nausea/vomiting.  Discussed options watchful waiting vs. Referral with general surgery to discuss options.  Patient requesting referral to general surgery to "have it dealt with."  Will place referral today.  Continue to avoid heavy lifting or activities that exacerbate hernia.  If becomes purple, painful, or issues with nausea/vomiting or constipation develop, go to ER.  Return to clinic if worsens in meantime.      Relevant Orders   Ambulatory referral to General Surgery  Follow up plan: Return if symptoms worsen or fail to improve.

## 2020-05-12 NOTE — Assessment & Plan Note (Signed)
Acute, ongoing.  Non-painful, no issues with constipation or nausea/vomiting.  Discussed options watchful waiting vs. Referral with general surgery to discuss options.  Patient requesting referral to general surgery to "have it dealt with."  Will place referral today.  Continue to avoid heavy lifting or activities that exacerbate hernia.  If becomes purple, painful, or issues with nausea/vomiting or constipation develop, go to ER.  Return to clinic if worsens in meantime.

## 2020-05-19 ENCOUNTER — Ambulatory Visit (INDEPENDENT_AMBULATORY_CARE_PROVIDER_SITE_OTHER): Payer: Medicare HMO | Admitting: General Surgery

## 2020-05-19 ENCOUNTER — Encounter: Payer: Self-pay | Admitting: General Surgery

## 2020-05-19 ENCOUNTER — Telehealth: Payer: Self-pay

## 2020-05-19 ENCOUNTER — Other Ambulatory Visit: Payer: Self-pay

## 2020-05-19 VITALS — BP 134/75 | HR 76 | Temp 98.3°F | Ht 71.0 in | Wt 178.0 lb

## 2020-05-19 DIAGNOSIS — K4090 Unilateral inguinal hernia, without obstruction or gangrene, not specified as recurrent: Secondary | ICD-10-CM

## 2020-05-19 NOTE — Patient Instructions (Addendum)
Our surgery scheduler Pamala Hurry will contact you within the 24-48 hours. During the call, she will discuss the preparation prior to the surgery and also discuss the different dates and times for surgery. Please have the BLUE sheet available when [facility] contacts you. If you have any concerns regarding surgery, please do not hesitate to give our office a call. Medical Clearance was sent at today's visit. Patient is to STOP Aspirin 7 days prior to surgery.  Inguinal Hernia, Adult An inguinal hernia is when fat or your intestines push through a weak spot in a muscle where your leg meets your lower belly (groin). This causes a rounded lump (bulge). This kind of hernia could also be:  In your scrotum, if you are male.  In folds of skin around your vagina, if you are male. There are three types of inguinal hernias. These include:  Hernias that can be pushed back into the belly (are reducible). This type rarely causes pain.  Hernias that cannot be pushed back into the belly (are incarcerated).  Hernias that cannot be pushed back into the belly and lose their blood supply (are strangulated). This type needs emergency surgery. If you do not have symptoms, you may not need treatment. If you have symptoms or a large hernia, you may need surgery. Follow these instructions at home: Lifestyle  Do these things if told by your doctor so you do not have trouble pooping (constipation): ? Drink enough fluid to keep your pee (urine) pale yellow. ? Eat foods that have a lot of fiber. These include fresh fruits and vegetables, whole grains, and beans. ? Limit foods that are high in fat and processed sugars. These include foods that are fried or sweet. ? Take medicine for trouble pooping.  Avoid lifting heavy objects.  Avoid standing for long amounts of time.  Do not use any products that contain nicotine or tobacco. These include cigarettes and e-cigarettes. If you need help quitting, ask your  doctor.  Stay at a healthy weight. General instructions  You may try to push your hernia in by very gently pressing on it when you are lying down. Do not try to force the bulge back in if it will not push in easily.  Watch your hernia for any changes in shape, size, or color. Tell your doctor if you see any changes.  Take over-the-counter and prescription medicines only as told by your doctor.  Keep all follow-up visits as told by your doctor. This is important. Contact a doctor if:  You have a fever.  You have new symptoms.  Your symptoms get worse. Get help right away if:  The area where your leg meets your lower belly has: ? Pain that gets worse suddenly. ? A bulge that gets bigger suddenly, and it does not get smaller after that. ? A bulge that turns red or purple. ? A bulge that is painful when you touch it.  You are a man, and your scrotum: ? Suddenly feels painful. ? Suddenly changes in size.  You cannot push the hernia in by very gently pressing on it when you are lying down. Do not try to force the bulge back in if it will not push in easily.  You feel sick to your stomach (nauseous), and that feeling does not go away.  You throw up (vomit), and that keeps happening.  You have a fast heartbeat.  You cannot poop (have a bowel movement) or pass gas. These symptoms may be an emergency. Do  not wait to see if the symptoms will go away. Get medical help right away. Call your local emergency services (911 in the [place] Summary  An inguinal hernia is when fat or your intestines push through a weak spot in a muscle where your leg meets your lower belly (groin). This causes a rounded lump (bulge).  If you do not have symptoms, you may not need treatment. If you have symptoms or a large hernia, you may need surgery.  Avoid lifting heavy objects. Also avoid standing for long amounts of time.  Do not try to force the bulge back in if it will not push in easily. This  information is not intended to replace advice given to you by your health care provider. Make sure you discuss any questions you have with your health care provider. Document Revised: 09/21/2017 Document Reviewed: 05/22/2017 Elsevier Patient Education  [facility].

## 2020-05-19 NOTE — Telephone Encounter (Signed)
Medical Clearance was faxed over to Noemi Chapel office at [facility] office.

## 2020-05-19 NOTE — H&P (View-Only) (Signed)
Patient ID: Ryan Kirby, male   DOB: 12-Nov-1940, 79 y.o.   MRN: [id number]  Chief Complaint  Patient presents with  . New Patient (Initial Visit)    Inguinal Hernia    HPI Ryan Kirby is a 79 y.o. male.  He has been referred by his primary care provider, Noemi Chapel, nurse practitioner, for further evaluation of a possible right inguinal hernia.  He says that he has noticed a slight bulge and some discomfort in his right groin for the past 3 to 5 months.  He says that it has not changed in nature over that time.  The discomfort is worse with standing and if he is walking a lot.  He denies any difficulty with urination but does endorse chronic constipation, for which he takes a stool softener.  He states that he would like to have it repaired before it becomes any worse.   Past Medical History:  Diagnosis Date  . Allergy   . CAD (coronary artery disease)   . Chronic kidney disease    pt is not on dialysis  . Diabetes mellitus without complication ([place])   . GERD (gastroesophageal reflux disease)   . Hyperlipidemia     Past Surgical History:  Procedure Laterality Date  . BOWEL RESECTION  2004  . CORONARY ARTERY BYPASS GRAFT    . FINGER AMPUTATION Left 2011   5th  . KNEE ARTHROSCOPY Right 09/12/2015   Procedure: ARTHROSCOPY KNEE, LATERAL MENISECTOMY, CHONDROPLASTY;  Surgeon: Dereck Leep, MD;  Location: [facility] ORS;  Service: Orthopedics;  Laterality: Right;    Family History  Problem Relation Age of Onset  . Arthritis Mother   . Hypertension Mother   . Breast cancer Mother   . Arthritis Father   . Hypertension Father   . Colon cancer Father     Social History Social History   Tobacco Use  . Smoking status: Never Smoker  . Smokeless tobacco: Never Used  Vaping Use  . Vaping Use: Never used  Substance Use Topics  . Alcohol use: No    Alcohol/week: 0.0 standard drinks  . Drug use: No    Allergies  Allergen Reactions  . Oxycontin [Oxycodone] Nausea And  Vomiting    Current Outpatient Medications  Medication Sig Dispense Refill  . amLODipine (NORVASC) 10 MG tablet Take 1 tablet (10 mg total) by mouth daily. 90 tablet 1  . aspirin EC 81 MG tablet Take 81 mg by mouth daily.    . benazepril-hydrochlorthiazide (LOTENSIN HCT) 20-12.5 MG tablet Take 1 tablet by mouth daily. 90 tablet 1  . carvedilol (COREG) 25 MG tablet Take 2 tablets (50 mg total) by mouth 2 (two) times daily. 360 tablet 1  . cetirizine (ZYRTEC) 10 MG tablet Take 10 mg by mouth daily.    . cloNIDine (CATAPRES) 0.3 MG tablet Take 1 tablet (0.3 mg total) by mouth 2 (two) times daily. 180 tablet 1  . docusate sodium (COLACE) 100 MG capsule     . doxazosin (CARDURA) 2 MG tablet Take 1 tablet (2 mg total) by mouth every morning. 90 tablet 1  . empagliflozin (JARDIANCE) 25 MG TABS tablet Take 25 mg by mouth daily before breakfast. 90 tablet 1  . esomeprazole (NEXIUM) 20 MG capsule Take 20 mg by mouth daily. Reported on 09/12/2015    . furosemide (LASIX) 40 MG tablet Take 1 tablet (40 mg total) by mouth daily. 90 tablet 1  . glipiZIDE (GLUCOTROL) 5 MG tablet Take 1 tablet (5 mg  total) by mouth daily before breakfast. 90 tablet 1  . metFORMIN (GLUCOPHAGE) 500 MG tablet Take 1 tablet (500 mg total) by mouth 2 (two) times daily. 180 tablet 1  . potassium chloride SA (KLOR-CON) 20 MEQ tablet Take 1 tablet (20 mEq total) by mouth daily. 90 tablet 1  . pravastatin (PRAVACHOL) 40 MG tablet Take 1 tablet (40 mg total) by mouth at bedtime. 90 tablet 1  . senna (SENOKOT) 8.6 MG TABS tablet Take 2 tablets (17.2 mg total) by mouth at bedtime as needed for mild constipation. 120 each 0   No current facility-administered medications for this visit.    Review of Systems Review of Systems  Gastrointestinal: Positive for constipation.       GERD  All other systems reviewed and are negative.   Blood pressure 134/75, pulse 76, temperature 98.3 F (36.8 C), height 5\' 11"  (1.803 m), weight 178 lb  (80.7 kg), SpO2 97 %.  Physical Exam Physical Exam Vitals reviewed. Exam conducted with a chaperone present.  Constitutional:      General: He is not in acute distress.    Appearance: Normal appearance. He is normal weight.  HENT:     Head: Normocephalic.     Nose:     Comments: He has an abrasion on the tip of his nose that he says is from a recent fall.    Mouth/Throat:     Comments: Covered with a mask Eyes:     General: No scleral icterus.       Right eye: No discharge.        Left eye: No discharge.  Neck:     Comments: No palpable cervical or supraclavicular lymphadenopathy.  The trachea is midline.  The thyroid is slightly full but without any palpable dominant masses.  The gland moves freely with deglutition. Cardiovascular:     Rate and Rhythm: Normal rate and regular rhythm.  Pulmonary:     Effort: Pulmonary effort is normal.     Breath sounds: Normal breath sounds.  Abdominal:     General: Bowel sounds are normal.     Palpations: Abdomen is soft.     Hernia: A hernia is present. Hernia is present in the right inguinal area.  Genitourinary:      Comments: Slight bulge with Valsalva. Musculoskeletal:        General: No deformity or signs of injury.  Skin:    General: Skin is warm and dry.          Comments: He has some skin crusting and erythema on his anterior tibial surfaces.  He says that it does not itch.  Neurological:     Mental Status: He is alert and oriented to person, place, and time.  Psychiatric:        Mood and Affect: Mood normal.        Behavior: Behavior normal.     Data Reviewed I reviewed the clinic note from May 12, 2020 from Ms. Edsel Petrin, where in she diagnosed the hernia and placed a referral to general surgery.  Assessment This is a 79 year old man with a small right inguinal hernia.  On exam, it feels like it is most likely a direct hernia.  He is interested in surgical repair.  Plan I have explained the procedure, risks, and  aftercare of inguinal hernia repair to Ryan Kirby.   Risks include but are not limited to bleeding, infection, wound problems, anesthesia, recurrence, bladder or intestine injury, urinary retention, testicular dysfunction,  chronic pain, mesh problems.  He  seems to understand and agrees to proceed.  Questions were answered to his stated satisfaction.  We will work on getting him scheduled.    Ryan Kirby 05/19/2020, 11:45 AM

## 2020-05-19 NOTE — Progress Notes (Signed)
Patient ID: Ryan Kirby, male   DOB: 04/20/1941, 79 y.o.   MRN: [id number]  Chief Complaint  Patient presents with  . New Patient (Initial Visit)    Inguinal Hernia    HPI Ryan Kirby is a 79 y.o. male.  He has been referred by his primary care provider, Noemi Chapel, nurse practitioner, for further evaluation of a possible right inguinal hernia.  He says that he has noticed a slight bulge and some discomfort in his right groin for the past 3 to 5 months.  He says that it has not changed in nature over that time.  The discomfort is worse with standing and if he is walking a lot.  He denies any difficulty with urination but does endorse chronic constipation, for which he takes a stool softener.  He states that he would like to have it repaired before it becomes any worse.   Past Medical History:  Diagnosis Date  . Allergy   . CAD (coronary artery disease)   . Chronic kidney disease    pt is not on dialysis  . Diabetes mellitus without complication ([place])   . GERD (gastroesophageal reflux disease)   . Hyperlipidemia     Past Surgical History:  Procedure Laterality Date  . BOWEL RESECTION  2004  . CORONARY ARTERY BYPASS GRAFT    . FINGER AMPUTATION Left 2011   5th  . KNEE ARTHROSCOPY Right 09/12/2015   Procedure: ARTHROSCOPY KNEE, LATERAL MENISECTOMY, CHONDROPLASTY;  Surgeon: Dereck Leep, MD;  Location: [facility] ORS;  Service: Orthopedics;  Laterality: Right;    Family History  Problem Relation Age of Onset  . Arthritis Mother   . Hypertension Mother   . Breast cancer Mother   . Arthritis Father   . Hypertension Father   . Colon cancer Father     Social History Social History   Tobacco Use  . Smoking status: Never Smoker  . Smokeless tobacco: Never Used  Vaping Use  . Vaping Use: Never used  Substance Use Topics  . Alcohol use: No    Alcohol/week: 0.0 standard drinks  . Drug use: No    Allergies  Allergen Reactions  . Oxycontin [Oxycodone] Nausea And  Vomiting    Current Outpatient Medications  Medication Sig Dispense Refill  . amLODipine (NORVASC) 10 MG tablet Take 1 tablet (10 mg total) by mouth daily. 90 tablet 1  . aspirin EC 81 MG tablet Take 81 mg by mouth daily.    . benazepril-hydrochlorthiazide (LOTENSIN HCT) 20-12.5 MG tablet Take 1 tablet by mouth daily. 90 tablet 1  . carvedilol (COREG) 25 MG tablet Take 2 tablets (50 mg total) by mouth 2 (two) times daily. 360 tablet 1  . cetirizine (ZYRTEC) 10 MG tablet Take 10 mg by mouth daily.    . cloNIDine (CATAPRES) 0.3 MG tablet Take 1 tablet (0.3 mg total) by mouth 2 (two) times daily. 180 tablet 1  . docusate sodium (COLACE) 100 MG capsule     . doxazosin (CARDURA) 2 MG tablet Take 1 tablet (2 mg total) by mouth every morning. 90 tablet 1  . empagliflozin (JARDIANCE) 25 MG TABS tablet Take 25 mg by mouth daily before breakfast. 90 tablet 1  . esomeprazole (NEXIUM) 20 MG capsule Take 20 mg by mouth daily. Reported on 09/12/2015    . furosemide (LASIX) 40 MG tablet Take 1 tablet (40 mg total) by mouth daily. 90 tablet 1  . glipiZIDE (GLUCOTROL) 5 MG tablet Take 1 tablet (5 mg  total) by mouth daily before breakfast. 90 tablet 1  . metFORMIN (GLUCOPHAGE) 500 MG tablet Take 1 tablet (500 mg total) by mouth 2 (two) times daily. 180 tablet 1  . potassium chloride SA (KLOR-CON) 20 MEQ tablet Take 1 tablet (20 mEq total) by mouth daily. 90 tablet 1  . pravastatin (PRAVACHOL) 40 MG tablet Take 1 tablet (40 mg total) by mouth at bedtime. 90 tablet 1  . senna (SENOKOT) 8.6 MG TABS tablet Take 2 tablets (17.2 mg total) by mouth at bedtime as needed for mild constipation. 120 each 0   No current facility-administered medications for this visit.    Review of Systems Review of Systems  Gastrointestinal: Positive for constipation.       GERD  All other systems reviewed and are negative.   Blood pressure 134/75, pulse 76, temperature 98.3 F (36.8 C), height 5\' 11"  (1.803 m), weight 178 lb  (80.7 kg), SpO2 97 %.  Physical Exam Physical Exam Vitals reviewed. Exam conducted with a chaperone present.  Constitutional:      General: He is not in acute distress.    Appearance: Normal appearance. He is normal weight.  HENT:     Head: Normocephalic.     Nose:     Comments: He has an abrasion on the tip of his nose that he says is from a recent fall.    Mouth/Throat:     Comments: Covered with a mask Eyes:     General: No scleral icterus.       Right eye: No discharge.        Left eye: No discharge.  Neck:     Comments: No palpable cervical or supraclavicular lymphadenopathy.  The trachea is midline.  The thyroid is slightly full but without any palpable dominant masses.  The gland moves freely with deglutition. Cardiovascular:     Rate and Rhythm: Normal rate and regular rhythm.  Pulmonary:     Effort: Pulmonary effort is normal.     Breath sounds: Normal breath sounds.  Abdominal:     General: Bowel sounds are normal.     Palpations: Abdomen is soft.     Hernia: A hernia is present. Hernia is present in the right inguinal area.  Genitourinary:      Comments: Slight bulge with Valsalva. Musculoskeletal:        General: No deformity or signs of injury.  Skin:    General: Skin is warm and dry.          Comments: He has some skin crusting and erythema on his anterior tibial surfaces.  He says that it does not itch.  Neurological:     Mental Status: He is alert and oriented to person, place, and time.  Psychiatric:        Mood and Affect: Mood normal.        Behavior: Behavior normal.     Data Reviewed I reviewed the clinic note from May 12, 2020 from Ms. Edsel Petrin, where in she diagnosed the hernia and placed a referral to general surgery.  Assessment This is a 79 year old man with a small right inguinal hernia.  On exam, it feels like it is most likely a direct hernia.  He is interested in surgical repair.  Plan I have explained the procedure, risks, and  aftercare of inguinal hernia repair to [employer].   Risks include but are not limited to bleeding, infection, wound problems, anesthesia, recurrence, bladder or intestine injury, urinary retention, testicular dysfunction,  chronic pain, mesh problems.  He  seems to understand and agrees to proceed.  Questions were answered to his stated satisfaction.  We will work on getting him scheduled.    Fredirick Maudlin 05/19/2020, 11:45 AM

## 2020-05-20 ENCOUNTER — Telehealth: Payer: Self-pay

## 2020-05-20 ENCOUNTER — Telehealth: Payer: Self-pay | Admitting: General Surgery

## 2020-05-20 NOTE — Telephone Encounter (Signed)
Surgical Clearance form received. Please call and schedule patient an appointment on or before 05/30/20 to be cleared for upcoming procedure. Form requiring notes, EKG and lab work. Form placed in incomplete bin.

## 2020-05-20 NOTE — Telephone Encounter (Signed)
Patient has been advised of Pre-Admission date/time, COVID Testing date and Surgery date.  Surgery Date: 06/01/20 Preadmission Testing Date: 05/25/20 (phone 1p-5p) Covid Testing Date: 05/30/20 - patient advised to go to the [facility] ([place]) between 8a-1p   Patient has been made aware to call [phone], between 1-3:00pm the day before surgery, to find out what time to arrive for surgery.

## 2020-05-20 NOTE — Telephone Encounter (Signed)
PT scheduled for 05/26/2020 at 2:20 pm with Noemi Chapel NP

## 2020-05-25 ENCOUNTER — Inpatient Hospital Stay: Admission: RE | Admit: 2020-05-25 | Payer: Medicare HMO | Source: Ambulatory Visit

## 2020-05-26 ENCOUNTER — Ambulatory Visit (INDEPENDENT_AMBULATORY_CARE_PROVIDER_SITE_OTHER): Payer: Medicare HMO | Admitting: Nurse Practitioner

## 2020-05-26 ENCOUNTER — Other Ambulatory Visit: Payer: Self-pay

## 2020-05-26 ENCOUNTER — Encounter: Payer: Self-pay | Admitting: Nurse Practitioner

## 2020-05-26 ENCOUNTER — Ambulatory Visit: Payer: Medicare HMO | Admitting: Unknown Physician Specialty

## 2020-05-26 ENCOUNTER — Encounter
Admission: RE | Admit: 2020-05-26 | Discharge: 2020-05-26 | Disposition: A | Discharge status: Home / Self Care | Payer: Medicare HMO | Source: Ambulatory Visit | Attending: General Surgery | Admitting: General Surgery

## 2020-05-26 VITALS — BP 150/72 | HR 74 | Temp 98.1°F | Wt 176.6 lb

## 2020-05-26 DIAGNOSIS — Z01818 Encounter for other preprocedural examination: Secondary | ICD-10-CM

## 2020-05-26 DIAGNOSIS — K4090 Unilateral inguinal hernia, without obstruction or gangrene, not specified as recurrent: Secondary | ICD-10-CM

## 2020-05-26 DIAGNOSIS — Z01812 Encounter for preprocedural laboratory examination: Secondary | ICD-10-CM | POA: Insufficient documentation

## 2020-05-26 HISTORY — DX: Unspecified osteoarthritis, unspecified site: M19.90

## 2020-05-26 NOTE — Patient Instructions (Addendum)
Your procedure is scheduled on:06-01-20 [facility] Report to Day Surgery on the 2nd floor of the [facility]. To find out your arrival time, please call [phone] between 1PM - 3PM on:05-31-20 TUESDAY  REMEMBER: Instructions that are not followed completely may result in serious medical risk, up to and including death; or upon the discretion of your surgeon and anesthesiologist your surgery may need to be rescheduled.  Do not eat food after midnight the night before surgery.  No gum chewing, lozengers or hard candies.  You may however, drink WATER up to 2 hours before you are scheduled to arrive for your surgery. Do not drink anything within 2 hours of your scheduled arrival time.  Type 1 and Type 2 diabetics should only drink water.  TAKE THESE MEDICATIONS THE MORNING OF SURGERY WITH A SIP OF WATER:  -PRILOSEC (OMEPRAZOLE)-take one the night before and one on the morning of surgery - helps to prevent nausea after surgery.  -COREG (CARVEDILOL) -NORVASC (AMLODIPINE) -CLONIDINE (CATAPRES) -CARDURA (DOXAZOSIN)   Stop Metformin 2 days prior to surgery-LAST DOSE ON 05-29-20 SUNDAY  Follow recommendations from Cardiologist, Pulmonologist or PCP regarding stopping Aspirin, Coumadin, Plavix, Eliquis, Pradaxa, or Pletal-LAST DOSE OF ASPIRIN WAS  05-24-20 TUESDAY  One week prior to surgery: Stop Anti-inflammatories (NSAIDS) such as Advil, Aleve, Ibuprofen, Motrin, Naproxen, Naprosyn and Aspirin based products such as Excedrin, Goodys Powder, BC Powder-TYLENOL OK TO TAKE Stop ANY OVER THE COUNTER supplements until after surgery. (You may continue taking Tylenol, Vitamin D, Vitamin B, and multivitamin.)  No Alcohol for 24 hours before or after surgery.  No Smoking including e-cigarettes for 24 hours prior to surgery.  No chewable tobacco products for at least 6 hours prior to surgery.  No nicotine patches on the day of surgery.  Do not use any "recreational" drugs for at least a week prior  to your surgery.  Please be advised that the combination of cocaine and anesthesia may have negative outcomes, up to and including death. If you test positive for cocaine, your surgery will be cancelled.  On the morning of surgery brush your teeth with toothpaste and water, you may rinse your mouth with mouthwash if you wish. Do not swallow any toothpaste or mouthwash.  Do not wear jewelry, make-up, hairpins, clips or nail polish.  Do not wear lotions, powders, or perfumes.   Do not shave 48 hours prior to surgery.   Contact lenses, hearing aids and dentures may not be worn into surgery.  Do not bring valuables to the hospital. [facility] is not responsible for any missing/lost belongings or valuables.   Use CHG Soap or wipes as directed on instruction sheet.  Total Shoulder Arthroplasty:  use Benzolyl Peroxide 5% Gel as directed on instruction sheet.  Fleets enema or Magnesium Citrate as directed.  Bring your C-PAP to the hospital with you in case you may have to spend the night.   Notify your doctor if there is any change in your medical condition (cold, fever, infection).  Wear comfortable clothing (specific to your surgery type) to the hospital.  Plan for stool softeners for home use; pain medications have a tendency to cause constipation. You can also help prevent constipation by eating foods high in fiber such as fruits and vegetables and drinking plenty of fluids as your diet allows.  After surgery, you can help prevent lung complications by doing breathing exercises.  Take deep breaths and cough every 1-2 hours. Your doctor may order a device called an Chiropodist  to help you take deep breaths. When coughing or sneezing, hold a pillow firmly against your incision with both hands. This is called "splinting." Doing this helps protect your incision. It also decreases belly discomfort.  If you are being admitted to the hospital overnight, leave your suitcase in the  car. After surgery it may be brought to your room.  If you are being discharged the day of surgery, you will not be allowed to drive home. You will need a responsible adult (18 years or older) to drive you home and stay with you that night.   If you are taking public transportation, you will need to have a responsible adult (18 years or older) with you. Please confirm with your physician that it is acceptable to use public transportation.   Please call the [facility] at [phone] if you have any questions about these instructions.  Visitation Policy:  Patients undergoing a surgery or procedure may have one family member or support person with them as long as that person is not COVID-19 positive or experiencing its symptoms.  That person may remain in the waiting area during the procedure.  Inpatient Visitation Update:   In an effort to ensure the safety of our team members and our patients, we are implementing a change to our visitation policy:  Effective Monday, Aug. 9, at 7 a.m., inpatients will be allowed one support person.  o The support person may change daily.  o The support person must pass our screening, gel in and out, and wear a mask at all times, including in the patient's room.  o Patients must also wear a mask when staff or their support person are in the room.  o Masking is required regardless of vaccination status.  Systemwide, no visitors 17 or younger.

## 2020-05-26 NOTE — Patient Instructions (Signed)

## 2020-05-26 NOTE — Assessment & Plan Note (Addendum)
Acute, ongoing.  Causing some issues with pain and passing bowels.  Plans for open right inguinal hernia repair on / with Dr. Celine Ahr.  Medically cleared for surgery with medium risk assessment.  Will fax form to surgeon's office.  EKG reviewed and appears at baseline.  Patient stopped aspirin yesterday and will hold until surgery.  To resume 24 hours after surgery.  CBC and PT/INR checked today.

## 2020-05-26 NOTE — Progress Notes (Signed)
BP (!) 150/72 (BP Location: Right Arm, Cuff Size: Normal)   Pulse 74   Temp 98.1 F (36.7 C) (Oral)   Wt 176 lb 9.6 oz (80.1 kg)   SpO2 99%   BMI 24.63 kg/m    Subjective:    Patient ID: Ryan Kirby, male    DOB: 01-02-1941, 79 y.o.   MRN: [id number]  HPI: Ryan Kirby is a 79 y.o. male presents for preoperative clearance.   Chief Complaint  Patient presents with  . Surgical Clearance    Hernia surgery scheduked for 9/29   Patient states that he feels well and in his usual state of health.  He is preparing to have an outpatient open right inguinal hernia repair on 06/01/20 with Dr. Celine Ahr.  He denies any new issues including chest pain, shortness of breath, bowel movement changes, fevers, or joint pain.  He stopped taking his aspirin yesterday in preparation for surgery.    HERNIA Duration: weeks Location: right groin Painful: yes Discomfort: yes Bulge: yes Quality:  Dull ache Onset: sudden Severity: moderate Context: not changing Aggravating factors: lifting, standing, bearing down   Allergies  Allergen Reactions  . Oxycontin [Oxycodone] Nausea And Vomiting   Outpatient Encounter Medications as of 05/26/2020  Medication Sig  . amLODipine (NORVASC) 10 MG tablet Take 1 tablet (10 mg total) by mouth daily.  . benazepril-hydrochlorthiazide (LOTENSIN HCT) 20-12.5 MG tablet Take 1 tablet by mouth daily.  . carvedilol (COREG) 25 MG tablet Take 2 tablets (50 mg total) by mouth 2 (two) times daily.  . cetirizine (ZYRTEC) 10 MG tablet Take 10 mg by mouth every 3 (three) days.   . cloNIDine (CATAPRES) 0.3 MG tablet Take 1 tablet (0.3 mg total) by mouth 2 (two) times daily.  Marland Kitchen docusate sodium (COLACE) 100 MG capsule Take 100 mg by mouth every other day.   . doxazosin (CARDURA) 2 MG tablet Take 1 tablet (2 mg total) by mouth every morning.  . empagliflozin (JARDIANCE) 25 MG TABS tablet Take 25 mg by mouth daily before breakfast.  . esomeprazole (NEXIUM) 20 MG capsule Take 20  mg by mouth daily. Reported on 09/12/2015  . furosemide (LASIX) 40 MG tablet Take 1 tablet (40 mg total) by mouth daily.  Marland Kitchen glipiZIDE (GLUCOTROL) 5 MG tablet Take 1 tablet (5 mg total) by mouth daily before breakfast.  . metFORMIN (GLUCOPHAGE) 500 MG tablet Take 1 tablet (500 mg total) by mouth 2 (two) times daily.  Marland Kitchen omeprazole (PRILOSEC OTC) 20 MG tablet Take 20 mg by mouth every 3 (three) days.  . potassium chloride SA (KLOR-CON) 20 MEQ tablet Take 1 tablet (20 mEq total) by mouth daily.  . pravastatin (PRAVACHOL) 40 MG tablet Take 1 tablet (40 mg total) by mouth at bedtime.  . senna (SENOKOT) 8.6 MG TABS tablet Take 2 tablets (17.2 mg total) by mouth at bedtime as needed for mild constipation.  Marland Kitchen aspirin EC 81 MG tablet Take 81 mg by mouth daily. (Patient not taking: Reported on 05/20/2020)   No facility-administered encounter medications on file as of 05/26/2020.   Patient Active Problem List   Diagnosis Date Noted  . Non-recurrent unilateral inguinal hernia without obstruction or gangrene 05/12/2020  . Hip pain 09/22/2018  . CKD stage 3 secondary to diabetes ([place]) 07/02/2018  . Advanced care planning/counseling discussion 12/24/2017  . GERD (gastroesophageal reflux disease) 12/06/2016  . BPH (benign prostatic hyperplasia) 12/06/2016  . DM type 2 causing CKD stage 3 ([place]) 06/02/2015  . Hyperlipidemia  associated with type 2 diabetes mellitus ([place])   . Parenchymal renal hypertension   . CAD (coronary artery disease)   . Heart murmur 03/04/2014  . 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  . Intermittent alternating exotropia 12/17/2012  . Preglaucoma 12/17/2012   Past Medical History:  Diagnosis Date  . Allergy   . CAD (coronary artery disease)   . Chronic kidney disease    pt is not on dialysis  . Diabetes mellitus without complication ([place])   . GERD (gastroesophageal reflux disease)   . Hyperlipidemia    Relevant past medical, surgical, family and social history reviewed and  updated as indicated. Interim medical history since our last visit reviewed.  Review of Systems  Constitutional: Negative.  Negative for activity change, appetite change and fever.  HENT: Negative.   Respiratory: Negative.  Negative for chest tightness, shortness of breath and wheezing.   Cardiovascular: Negative.  Negative for chest pain, palpitations and leg swelling.  Gastrointestinal: Positive for abdominal pain and constipation. Negative for blood in stool, diarrhea, nausea and vomiting.  Musculoskeletal: Negative.   Skin: Negative.  Negative for color change and rash.  Neurological: Negative.   Psychiatric/Behavioral: Negative.    Per HPI unless specifically indicated above     Objective:    BP (!) 150/72 (BP Location: Right Arm, Cuff Size: Normal)   Pulse 74   Temp 98.1 F (36.7 C) (Oral)   Wt 176 lb 9.6 oz (80.1 kg)   SpO2 99%   BMI 24.63 kg/m   Wt Readings from Last 3 Encounters:  05/26/20 176 lb 9.6 oz (80.1 kg)  05/19/20 178 lb (80.7 kg)  05/12/20 180 lb 6.4 oz (81.8 kg)    Physical Exam Vitals and nursing note reviewed.  Constitutional:      General: He is not in acute distress.    Appearance: Normal appearance. He is not toxic-appearing.  Cardiovascular:     Rate and Rhythm: Normal rate and regular rhythm.     Heart sounds: Normal heart sounds. No murmur heard.   Pulmonary:     Effort: Pulmonary effort is normal. No respiratory distress.     Breath sounds: Normal breath sounds. No wheezing, rhonchi or rales.  Abdominal:     General: Abdomen is flat. Bowel sounds are normal. There is no distension.     Palpations: Abdomen is soft.     Tenderness: There is abdominal tenderness.  Musculoskeletal:        General: Normal range of motion.  Neurological:     General: No focal deficit present.     Mental Status: He is alert and oriented to person, place, and time.     Gait: Gait normal.  Psychiatric:        Mood and Affect: Mood normal.        Behavior:  Behavior normal.        Thought Content: Thought content normal.        Judgment: Judgment normal.       Assessment & Plan:   Problem List Items Addressed This Visit      Other   Non-recurrent unilateral inguinal hernia without obstruction or gangrene    Acute, ongoing.  Causing some issues with pain and passing bowels.  Plans for open right inguinal hernia repair on / with Dr. Celine Ahr.  Medically cleared for surgery with medium risk assessment.  Will fax form to surgeon's office.  EKG reviewed and appears at baseline.  Patient stopped aspirin yesterday and will hold until  surgery.  To resume 24 hours after surgery.  CBC and PT/INR checked today.       Other Visit Diagnoses    Preoperative clearance    -  Primary   Relevant Orders   EKG 12-Lead (Completed)   CBC with Differential/Platelet   INR/PT       Follow up plan: Return for as scheduled.

## 2020-05-27 LAB — CBC WITH DIFFERENTIAL/PLATELET
Basophils Absolute: 0.1 10*3/uL (ref 0.0–0.2)
Basos: 1 %
EOS (ABSOLUTE): 0.4 10*3/uL (ref 0.0–0.4)
Eos: 5 %
Hematocrit: 43.7 % (ref 37.5–51.0)
Hemoglobin: 15.4 g/dL (ref 13.0–17.7)
Immature Grans (Abs): 0 10*3/uL (ref 0.0–0.1)
Immature Granulocytes: 0 %
Lymphocytes Absolute: 1 10*3/uL (ref 0.7–3.1)
Lymphs: 14 %
MCH: 31.8 pg (ref 26.6–33.0)
MCHC: 35.2 g/dL (ref 31.5–35.7)
MCV: 90 fL (ref 79–97)
Monocytes Absolute: 0.5 10*3/uL (ref 0.1–0.9)
Monocytes: 6 %
Neutrophils Absolute: 5.4 10*3/uL (ref 1.4–7.0)
Neutrophils: 74 %
Platelets: 208 10*3/uL (ref 150–450)
RBC: 4.84 x10E6/uL (ref 4.14–5.80)
RDW: 12.4 % (ref 11.6–15.4)
WBC: 7.3 10*3/uL (ref 3.4–10.8)

## 2020-05-27 LAB — PROTIME-INR
INR: 0.9 (ref 0.9–1.2)
Prothrombin Time: 9.9 s (ref 9.1–12.0)

## 2020-05-27 NOTE — Progress Notes (Signed)
Received medical clearance from Noemi Chapel, NP. Patient cleared at Medium risk for surgery. Ok to stop Aspirin prior to surgery. Notes in Epic.

## 2020-05-30 ENCOUNTER — Telehealth: Payer: Self-pay

## 2020-05-30 ENCOUNTER — Other Ambulatory Visit: Payer: Self-pay

## 2020-05-30 ENCOUNTER — Other Ambulatory Visit
Admission: RE | Admit: 2020-05-30 | Discharge: 2020-05-30 | Disposition: A | Discharge status: Home / Self Care | Payer: Medicare HMO | Source: Ambulatory Visit | Attending: General Surgery | Admitting: General Surgery

## 2020-05-30 ENCOUNTER — Other Ambulatory Visit: Payer: Self-pay | Admitting: General Surgery

## 2020-05-30 DIAGNOSIS — Z01812 Encounter for preprocedural laboratory examination: Secondary | ICD-10-CM | POA: Diagnosis not present

## 2020-05-30 DIAGNOSIS — Z20822 Contact with and (suspected) exposure to covid-19: Secondary | ICD-10-CM | POA: Insufficient documentation

## 2020-05-30 LAB — BASIC METABOLIC PANEL
Anion gap: 10 (ref 5–15)
BUN: 20 mg/dL (ref 8–23)
CO2: 29 mmol/L (ref 22–32)
Calcium: 9.9 mg/dL (ref 8.9–10.3)
Chloride: 100 mmol/L (ref 98–111)
Creatinine, Ser: 1.37 mg/dL — ABNORMAL HIGH (ref 0.61–1.24)
GFR calc Af Amer: 57 mL/min — ABNORMAL LOW (ref >60)
GFR calc non Af Amer: 49 mL/min — ABNORMAL LOW (ref >60)
Glucose, Bld: 229 mg/dL — ABNORMAL HIGH (ref 70–99)
Potassium: 3.1 mmol/L — ABNORMAL LOW (ref 3.5–5.1)
Sodium: 139 mmol/L (ref 135–145)

## 2020-05-30 LAB — SARS CORONAVIRUS 2 (TAT 6-24 HRS): SARS Coronavirus 2: NEGATIVE

## 2020-05-30 NOTE — Telephone Encounter (Signed)
Message left for the patient. Per Dr Celine Ahr his Potassium level is very low. She would like him to increase his potassium to 20 meq twice a day until surgery. He is currently taking 10 meq twice daily. He is to call the office back to let us know he did receive this message.

## 2020-05-30 NOTE — Progress Notes (Signed)
  [facility] Perioperative Services: Pre-Admission/Anesthesia Testing  Abnormal Lab Notification   Date: 05/30/20  Name: Ryan Kirby MRN:   [id number]  Re: Abnormal labs noted during PAT appointment   Provider(s) Notified: Fredirick Maudlin, MD Notification mode: Routed and/or faxed via [facility]   ABNORMAL LAB VALUE(S): Lab Results  Component Value Date   K 3.1 (L) 05/30/2020    Notes:  Patient on daily thiazide diuretic. He is scheduled for a HERNIA REPAIR INGUINAL ADULT, open (Right ) on 06/01/2020. Will send to primary surgeon for review to determine need for pre-surgical optimization. Will also place order for K+ to be re-checked on the day of surgery. This is a Community education officer; no formal response is required.  Honor Loh, MSN, APRN, FNP-C, CEN [facility]  Peri-operative Services Nurse Practitioner Phone: [phone] 05/30/20 1:01 PM

## 2020-05-31 NOTE — Telephone Encounter (Signed)
Contacted the patient this morning and advised him about increasing his Potassium dosage to 20 meq twice a day until surgery. He is aware and will increase his dosage.

## 2020-06-01 ENCOUNTER — Ambulatory Visit: Payer: Medicare HMO | Admitting: Urgent Care

## 2020-06-01 ENCOUNTER — Ambulatory Visit
Admission: RE | Admit: 2020-06-01 | Discharge: 2020-06-01 | Disposition: A | Discharge status: Home / Self Care | Payer: Medicare HMO | Attending: General Surgery | Admitting: General Surgery

## 2020-06-01 ENCOUNTER — Encounter
Admission: RE | Disposition: A | Discharge status: Home / Self Care | Payer: Self-pay | Source: Home / Self Care | Attending: General Surgery

## 2020-06-01 ENCOUNTER — Other Ambulatory Visit: Payer: Self-pay

## 2020-06-01 ENCOUNTER — Encounter: Payer: Self-pay | Admitting: General Surgery

## 2020-06-01 DIAGNOSIS — K4090 Unilateral inguinal hernia, without obstruction or gangrene, not specified as recurrent: Secondary | ICD-10-CM | POA: Diagnosis not present

## 2020-06-01 DIAGNOSIS — Z7984 Long term (current) use of oral hypoglycemic drugs: Secondary | ICD-10-CM | POA: Insufficient documentation

## 2020-06-01 DIAGNOSIS — E785 Hyperlipidemia, unspecified: Secondary | ICD-10-CM | POA: Insufficient documentation

## 2020-06-01 DIAGNOSIS — K5909 Other constipation: Secondary | ICD-10-CM | POA: Insufficient documentation

## 2020-06-01 DIAGNOSIS — Z951 Presence of aortocoronary bypass graft: Secondary | ICD-10-CM | POA: Insufficient documentation

## 2020-06-01 DIAGNOSIS — Z79899 Other long term (current) drug therapy: Secondary | ICD-10-CM | POA: Insufficient documentation

## 2020-06-01 DIAGNOSIS — Z7982 Long term (current) use of aspirin: Secondary | ICD-10-CM | POA: Insufficient documentation

## 2020-06-01 DIAGNOSIS — E1122 Type 2 diabetes mellitus with diabetic chronic kidney disease: Secondary | ICD-10-CM | POA: Diagnosis not present

## 2020-06-01 DIAGNOSIS — I2510 Atherosclerotic heart disease of native coronary artery without angina pectoris: Secondary | ICD-10-CM | POA: Insufficient documentation

## 2020-06-01 DIAGNOSIS — K219 Gastro-esophageal reflux disease without esophagitis: Secondary | ICD-10-CM | POA: Diagnosis not present

## 2020-06-01 DIAGNOSIS — K4091 Unilateral inguinal hernia, without obstruction or gangrene, recurrent: Secondary | ICD-10-CM | POA: Diagnosis not present

## 2020-06-01 DIAGNOSIS — N189 Chronic kidney disease, unspecified: Secondary | ICD-10-CM | POA: Diagnosis not present

## 2020-06-01 DIAGNOSIS — Z8249 Family history of ischemic heart disease and other diseases of the circulatory system: Secondary | ICD-10-CM | POA: Insufficient documentation

## 2020-06-01 DIAGNOSIS — Z885 Allergy status to narcotic agent status: Secondary | ICD-10-CM | POA: Diagnosis not present

## 2020-06-01 DIAGNOSIS — I129 Hypertensive chronic kidney disease with stage 1 through stage 4 chronic kidney disease, or unspecified chronic kidney disease: Secondary | ICD-10-CM | POA: Diagnosis not present

## 2020-06-01 DIAGNOSIS — M1990 Unspecified osteoarthritis, unspecified site: Secondary | ICD-10-CM | POA: Diagnosis not present

## 2020-06-01 DIAGNOSIS — N183 Chronic kidney disease, stage 3 unspecified: Secondary | ICD-10-CM | POA: Diagnosis not present

## 2020-06-01 HISTORY — PX: INGUINAL HERNIA REPAIR: SHX194

## 2020-06-01 LAB — GLUCOSE, CAPILLARY: Glucose-Capillary: 201 mg/dL — ABNORMAL HIGH (ref 70–99)

## 2020-06-01 LAB — POCT I-STAT, CHEM 8
BUN: 22 mg/dL (ref 8–23)
Calcium, Ion: 1.31 mmol/L (ref 1.15–1.40)
Chloride: 102 mmol/L (ref 98–111)
Creatinine, Ser: 1.3 mg/dL — ABNORMAL HIGH (ref 0.61–1.24)
Glucose, Bld: 234 mg/dL — ABNORMAL HIGH (ref 70–99)
HCT: 40 % (ref 39.0–52.0)
Hemoglobin: 13.6 g/dL (ref 13.0–17.0)
Potassium: 3.5 mmol/L (ref 3.5–5.1)
Sodium: 140 mmol/L (ref 135–145)
TCO2: 25 mmol/L (ref 22–32)

## 2020-06-01 SURGERY — REPAIR, HERNIA, INGUINAL, ADULT
Anesthesia: General | Laterality: Right

## 2020-06-01 MED ORDER — ACETAMINOPHEN 10 MG/ML IV SOLN: 1000.0000 mg | Freq: Once | INTRAVENOUS | Status: DC | PRN

## 2020-06-01 MED ORDER — PROPOFOL 10 MG/ML IV BOLUS
INTRAVENOUS | Status: DC | PRN
  Administered 2020-06-01: 20 mg via INTRAVENOUS
  Administered 2020-06-01: 100 mg via INTRAVENOUS

## 2020-06-01 MED ORDER — VASOPRESSIN 20 UNIT/ML IV SOLN
INTRAVENOUS | Status: DC | PRN
  Administered 2020-06-01: 1 [IU] via INTRAVENOUS

## 2020-06-01 MED ORDER — CHLORHEXIDINE GLUCONATE CLOTH 2 % EX PADS: 6.0000 | MEDICATED_PAD | Freq: Once | CUTANEOUS | Status: DC

## 2020-06-01 MED ORDER — ONDANSETRON HCL 4 MG/2ML IJ SOLN: 4.0000 mg | Freq: Once | INTRAMUSCULAR | Status: DC | PRN

## 2020-06-01 MED ORDER — MIDAZOLAM HCL 2 MG/2ML IJ SOLN
INTRAMUSCULAR | Status: AC
  Filled 2020-06-01: qty 2

## 2020-06-01 MED ORDER — LIDOCAINE HCL (CARDIAC) PF 100 MG/5ML IV SOSY
PREFILLED_SYRINGE | INTRAVENOUS | Status: DC | PRN
  Administered 2020-06-01: 40 mg via INTRAVENOUS

## 2020-06-01 MED ORDER — CEFAZOLIN SODIUM-DEXTROSE 2-4 GM/100ML-% IV SOLN
INTRAVENOUS | Status: AC
  Filled 2020-06-01: qty 100

## 2020-06-01 MED ORDER — PHENYLEPHRINE HCL (PRESSORS) 10 MG/ML IV SOLN
INTRAVENOUS | Status: DC | PRN
  Administered 2020-06-01 (×2): 100 ug via INTRAVENOUS

## 2020-06-01 MED ORDER — SODIUM CHLORIDE 0.9 % IV SOLN: INTRAVENOUS | Status: DC

## 2020-06-01 MED ORDER — SUGAMMADEX SODIUM 200 MG/2ML IV SOLN
INTRAVENOUS | Status: DC | PRN
  Administered 2020-06-01: 200 mg via INTRAVENOUS

## 2020-06-01 MED ORDER — ONDANSETRON HCL 4 MG/2ML IJ SOLN
INTRAMUSCULAR | Status: DC | PRN
  Administered 2020-06-01: 4 mg via INTRAVENOUS

## 2020-06-01 MED ORDER — ORAL CARE MOUTH RINSE: 15.0000 mL | Freq: Once | OROMUCOSAL | Status: AC

## 2020-06-01 MED ORDER — ROCURONIUM BROMIDE 100 MG/10ML IV SOLN
INTRAVENOUS | Status: DC | PRN
  Administered 2020-06-01: 5 mg via INTRAVENOUS
  Administered 2020-06-01: 50 mg via INTRAVENOUS
  Administered 2020-06-01: 10 mg via INTRAVENOUS
  Administered 2020-06-01: 5 mg via INTRAVENOUS

## 2020-06-01 MED ORDER — PROPOFOL 500 MG/50ML IV EMUL
INTRAVENOUS | Status: AC
  Filled 2020-06-01: qty 50

## 2020-06-01 MED ORDER — TRAMADOL HCL 50 MG PO TABS: 50.0000 mg | ORAL_TABLET | Freq: Four times a day (QID) | ORAL | 0 refills | Status: AC | PRN

## 2020-06-01 MED ORDER — CHLORHEXIDINE GLUCONATE 0.12 % MT SOLN
OROMUCOSAL | Status: AC
  Administered 2020-06-01: 15 mL via OROMUCOSAL
  Filled 2020-06-01: qty 15

## 2020-06-01 MED ORDER — FENTANYL CITRATE (PF) 100 MCG/2ML IJ SOLN
INTRAMUSCULAR | Status: DC | PRN
Start: 2020-06-01 — End: 2020-06-01
  Administered 2020-06-01 (×3): 50 ug via INTRAVENOUS

## 2020-06-01 MED ORDER — KETOROLAC TROMETHAMINE 15 MG/ML IJ SOLN
INTRAMUSCULAR | Status: DC | PRN
  Administered 2020-06-01: 15 mg via INTRAVENOUS

## 2020-06-01 MED ORDER — CEFAZOLIN SODIUM-DEXTROSE 2-4 GM/100ML-% IV SOLN
2.0000 g | INTRAVENOUS | Status: AC
  Administered 2020-06-01: 2 g via INTRAVENOUS

## 2020-06-01 MED ORDER — CHLORHEXIDINE GLUCONATE 0.12 % MT SOLN: 15.0000 mL | Freq: Once | OROMUCOSAL | Status: AC

## 2020-06-01 MED ORDER — FENTANYL CITRATE (PF) 250 MCG/5ML IJ SOLN
INTRAMUSCULAR | Status: AC
  Filled 2020-06-01: qty 5

## 2020-06-01 MED ORDER — ACETAMINOPHEN 500 MG PO TABS
ORAL_TABLET | ORAL | Status: AC
  Administered 2020-06-01: 1000 mg via ORAL
  Filled 2020-06-01: qty 2

## 2020-06-01 MED ORDER — BUPIVACAINE LIPOSOME 1.3 % IJ SUSP
INTRAMUSCULAR | Status: DC | PRN
  Administered 2020-06-01: 20 mL

## 2020-06-01 MED ORDER — BUPIVACAINE LIPOSOME 1.3 % IJ SUSP: 20.0000 mL | Freq: Once | INTRAMUSCULAR | Status: DC

## 2020-06-01 MED ORDER — EPHEDRINE SULFATE 50 MG/ML IJ SOLN
INTRAMUSCULAR | Status: DC | PRN
  Administered 2020-06-01: 10 mg via INTRAVENOUS

## 2020-06-01 MED ORDER — ACETAMINOPHEN 500 MG PO TABS: 1000.0000 mg | ORAL_TABLET | ORAL | Status: AC

## 2020-06-01 MED ORDER — LIDOCAINE-EPINEPHRINE 1 %-1:100000 IJ SOLN
INTRAMUSCULAR | Status: DC | PRN
  Administered 2020-06-01: 15 mL via INTRAMUSCULAR

## 2020-06-01 MED ORDER — FENTANYL CITRATE (PF) 100 MCG/2ML IJ SOLN: 25.0000 ug | INTRAMUSCULAR | Status: DC | PRN

## 2020-06-01 MED ORDER — ACETAMINOPHEN 500 MG PO TABS: 1000.0000 mg | ORAL_TABLET | Freq: Four times a day (QID) | ORAL | 0 refills | Status: AC | PRN

## 2020-06-01 SURGICAL SUPPLY — 37 items
BLADE CLIPPER SURG (BLADE) ×2 IMPLANT
CANISTER SUCT 1200ML W/VALVE (MISCELLANEOUS) ×2 IMPLANT
CHLORAPREP W/TINT 26 (MISCELLANEOUS) ×2 IMPLANT
COVER WAND RF STERILE (DRAPES) ×2 IMPLANT
DERMABOND ADVANCED (GAUZE/BANDAGES/DRESSINGS) ×1
DERMABOND ADVANCED .7 DNX12 (GAUZE/BANDAGES/DRESSINGS) ×1 IMPLANT
DRAIN PENROSE 5/8X18 LTX STRL (DRAIN) ×2 IMPLANT
DRAPE LAPAROTOMY 77X122 PED (DRAPES) ×2 IMPLANT
ELECT CAUTERY BLADE TIP 2.5 (TIP) ×2
ELECT REM PT RETURN 9FT ADLT (ELECTROSURGICAL) ×2
ELECTRODE CAUTERY BLDE TIP 2.5 (TIP) ×1 IMPLANT
ELECTRODE REM PT RTRN 9FT ADLT (ELECTROSURGICAL) ×1 IMPLANT
GLOVE BIO SURGEON STRL SZ 6.5 (GLOVE) ×2 IMPLANT
GLOVE INDICATOR 7.0 STRL GRN (GLOVE) ×4 IMPLANT
GOWN STRL REUS W/ TWL LRG LVL3 (GOWN DISPOSABLE) ×2 IMPLANT
GOWN STRL REUS W/TWL LRG LVL3 (GOWN DISPOSABLE) ×2
KIT TURNOVER KIT A (KITS) ×2 IMPLANT
LABEL OR SOLS (LABEL) ×2 IMPLANT
MESH PARIETEX PROGRIP RIGHT (Mesh General) ×2 IMPLANT
NEEDLE HYPO 22GX1.5 SAFETY (NEEDLE) ×4 IMPLANT
NS IRRIG 500ML POUR BTL (IV SOLUTION) ×2 IMPLANT
PACK BASIN MINOR (MISCELLANEOUS) ×2 IMPLANT
SPONGE KITTNER 5P (MISCELLANEOUS) ×2 IMPLANT
SPONGE LAP 18X18 RF (DISPOSABLE) ×2 IMPLANT
STRIP CLOSURE SKIN 1/2X4 (GAUZE/BANDAGES/DRESSINGS) ×2 IMPLANT
SUT ETHIBOND NAB MO 7 #0 18IN (SUTURE) IMPLANT
SUT MNCRL 4-0 (SUTURE) ×1
SUT MNCRL 4-0 27XMFL (SUTURE) ×1
SUT PROLENE 2 0 SH DA (SUTURE) IMPLANT
SUT VIC AB 2-0 SH 27 (SUTURE)
SUT VIC AB 2-0 SH 27XBRD (SUTURE) IMPLANT
SUT VIC AB 3-0 SH 27 (SUTURE) ×1
SUT VIC AB 3-0 SH 27X BRD (SUTURE) ×1 IMPLANT
SUTURE MNCRL 4-0 27XMF (SUTURE) ×1 IMPLANT
SYR 10ML LL (SYRINGE) ×2 IMPLANT
SYR 20ML LL LF (SYRINGE) ×2 IMPLANT
SYR BULB IRRIG 60ML STRL (SYRINGE) ×2 IMPLANT

## 2020-06-01 NOTE — Transfer of Care (Signed)
Immediate Anesthesia Transfer of Care Note  Patient: Ryan Kirby  Procedure(s) Performed: HERNIA REPAIR INGUINAL ADULT, open (Right )  Patient Location: PACU  Anesthesia Type:General  Level of Consciousness: drowsy and patient cooperative  Airway & Oxygen Therapy: Patient connected to face mask oxygen  Post-op Assessment: Report given to RN and Post -op Vital signs reviewed and stable  Post vital signs: Reviewed and stable  Last Vitals:  Vitals Value Taken Time  BP 90/54 06/01/20 0949  Temp    Pulse 70 06/01/20 0950  Resp 11 06/01/20 0950  SpO2 100 % 06/01/20 0950  Vitals shown include unvalidated device data.  Last Pain:  Vitals:   06/01/20 0734  TempSrc: Temporal  PainSc: 0-No pain         Complications: No complications documented.

## 2020-06-01 NOTE — Anesthesia Procedure Notes (Signed)
Procedure Name: Intubation Performed by: Emilyanne Mcgough L, CRNA Pre-anesthesia Checklist: Patient identified, Patient being monitored, Timeout performed, Emergency Drugs available and Suction available Patient Re-evaluated:Patient Re-evaluated prior to induction Oxygen Delivery Method: Circle system utilized Preoxygenation: Pre-oxygenation with 100% oxygen Induction Type: IV induction Ventilation: Mask ventilation without difficulty Laryngoscope Size: 3 and McGraph Grade View: Grade I Tube type: Oral Tube size: 7.0 mm Number of attempts: 1 Airway Equipment and Method: Stylet Placement Confirmation: ETT inserted through vocal cords under direct vision,  positive ETCO2 and breath sounds checked- equal and bilateral Secured at: 21 cm Tube secured with: Tape Dental Injury: Teeth and Oropharynx as per pre-operative assessment        

## 2020-06-01 NOTE — Interval H&P Note (Signed)
History and Physical Interval Note:  06/01/2020 7:51 AM  Ryan Kirby  has presented today for surgery, with the diagnosis of Right inguinal hernia.  The various methods of treatment have been discussed with the patient and family. After consideration of risks, benefits and other options for treatment, the patient has consented to  Procedure(s): HERNIA REPAIR INGUINAL ADULT, open (Right) as a surgical intervention.  The patient's history has been reviewed, patient examined, no change in status, stable for surgery.  I have reviewed the patient's chart and labs.  Questions were answered to the patient's satisfaction.     Fredirick Maudlin

## 2020-06-01 NOTE — Discharge Instructions (Addendum)
Bupivacaine Liposomal Suspension for Injection °What is this medicine? °BUPIVACAINE LIPOSOMAL (bue PIV a kane LIP oh som al) is an anesthetic. It causes loss of feeling in the skin or other tissues. It is used to prevent and to treat pain from some procedures. °This medicine may be used for other purposes; ask your health care provider or pharmacist if you have questions. °COMMON BRAND NAME(S): EXPAREL °What should I tell my health care provider before I take this medicine? °They need to know if you have any of these conditions: °· G6PD deficiency °· heart disease °· kidney disease °· liver disease °· low blood pressure °· lung or breathing disease, like asthma °· an unusual or allergic reaction to bupivacaine, other medicines, foods, dyes, or preservatives °· pregnant or trying to get pregnant °· breast-feeding °How should I use this medicine? °This medicine is for injection into the affected area. It is given by a health care professional in a hospital or clinic setting. °Talk to your pediatrician regarding the use of this medicine in children. Special care may be needed. °Overdosage: If you think you have taken too much of this medicine contact a poison control center or emergency room at once. °NOTE: This medicine is only for you. Do not share this medicine with others. °What if I miss a dose? °This does not apply. °What may interact with this medicine? °This medicine may interact with the following medications: °· acetaminophen °· certain antibiotics like dapsone, nitrofurantoin, aminosalicylic acid, sulfonamides °· certain medicines for seizures like phenobarbital, phenytoin, valproic acid °· chloroquine °· cyclophosphamide °· flutamide °· hydroxyurea °· ifosfamide °· metoclopramide °· nitric oxide °· nitroglycerin °· nitroprusside °· nitrous oxide °· other local anesthetics like lidocaine, pramoxine, tetracaine °· primaquine °· quinine °· rasburicase °· sulfasalazine °This list may not describe all possible  interactions. Give your health care provider a list of all the medicines, herbs, non-prescription drugs, or dietary supplements you use. Also tell them if you smoke, drink alcohol, or use illegal drugs. Some items may interact with your medicine. °What should I watch for while using this medicine? °Your condition will be monitored carefully while you are receiving this medicine. °Be careful to avoid injury while the area is numb, and you are not aware of pain. °What side effects may I notice from receiving this medicine? °Side effects that you should report to your doctor or health care professional as soon as possible: °· allergic reactions like skin rash, itching or hives, swelling of the face, lips, or tongue °· seizures °· signs and symptoms of a dangerous change in heartbeat or heart rhythm like chest pain; dizziness; fast, irregular heartbeat; palpitations; feeling faint or lightheaded; falls; breathing problems °· signs and symptoms of methemoglobinemia such as pale, gray, or blue colored skin; headache; fast heartbeat; shortness of breath; feeling faint or lightheaded, falls; tiredness °Side effects that usually do not require medical attention (report to your doctor or health care professional if they continue or are bothersome): °· anxious °· back pain °· changes in taste °· changes in vision °· constipation °· dizziness °· fever °· nausea, vomiting °This list may not describe all possible side effects. Call your doctor for medical advice about side effects. You may report side effects to FDA at 1-800-FDA-1088. °Where should I keep my medicine? °This drug is given in a hospital or clinic and will not be stored at home. °NOTE: This sheet is a summary. It may not cover all possible information. If you have questions about this   medicine, talk to your doctor, pharmacist, or health care provider. °© 2020 [employer] (2019-06-02 10:48:23) ° ° °AMBULATORY SURGERY  °DISCHARGE INSTRUCTIONS ° ° °1) The  drugs that you were given will stay in your system until tomorrow so for the next 24 hours you should not: ° °A) Drive an automobile °B) Make any legal decisions °C) Drink any alcoholic beverage ° ° °2) You may resume regular meals tomorrow.  Today it is better to start with liquids and gradually work up to solid foods. ° °You may eat anything you prefer, but it is better to start with liquids, then soup and crackers, and gradually work up to solid foods. ° ° °3) Please notify your doctor immediately if you have any unusual bleeding, trouble breathing, redness and pain at the surgery site, drainage, fever, or pain not relieved by medication. ° ° ° °4) Additional Instructions: ° ° ° ° ° ° ° °Please contact your physician with any problems or Same Day Surgery at [phone], Monday through Friday 6 am to 4 pm, or [facility] Main number at [phone]. °

## 2020-06-01 NOTE — Anesthesia Postprocedure Evaluation (Signed)
Anesthesia Post Note  Patient: Ryan Kirby  Procedure(s) Performed: HERNIA REPAIR INGUINAL ADULT, open (Right )  Patient location during evaluation: PACU Anesthesia Type: General Level of consciousness: awake and alert Pain management: pain level controlled Vital Signs Assessment: post-procedure vital signs reviewed and stable Respiratory status: spontaneous breathing, nonlabored ventilation, respiratory function stable and patient connected to nasal cannula oxygen Cardiovascular status: blood pressure returned to baseline and stable Postop Assessment: no apparent nausea or vomiting Anesthetic complications: no   No complications documented.   Last Vitals:  Vitals:   06/01/20 1023 06/01/20 1034  BP:    Pulse: 67 70  Resp: (!) 9 10  Temp:    SpO2: 100% 100%    Last Pain:  Vitals:   06/01/20 1023  TempSrc:   PainSc: 0-No pain                 Arita Miss

## 2020-06-01 NOTE — Op Note (Signed)
Hernia, Open, Procedure Note  Indications: The patient presented with a history of a right, reducible inguinal hernia.    Pre-operative Diagnosis: right reducible inguinal hernia  Post-operative Diagnosis: same, recurrent (findings at the time of surgery suggest a prior repair, likely pediatric)  Surgeon: Fredirick Maudlin   Assistants: Arvilla Meres, RNFA  Anesthesia: General endotracheal anesthesia  ASA Class: 3  Procedure Details  The patient was seen again in the Holding Room. The risks, benefits, complications, treatment options, and expected outcomes were discussed with the patient. The possibilities of reaction to medication, pulmonary aspiration, perforation of viscus, bleeding, recurrent infection, the need for additional procedures, and development of a complication requiring transfusion or further operation were discussed with the patient and/or family. There was concurrence with the proposed plan, and informed consent was obtained. The site of surgery was properly noted/marked. The patient was taken to the Operating Room, identified as Ryan Kirby, and the procedure verified as hernia repair. A Time Out was held and the above information confirmed.  The patient was placed in the supine position and underwent induction of anesthesia, the lower abdomen and groin was prepped and draped in the standard fashion.  A one-to-one mixture of 0.25% bupivacaine and 1% lidocaine with epinephrine was used to anesthetize the skin over the mid-portion of the inguinal canal. A transverse incision was made. Dissection was carried through the soft tissue to expose the inguinal canal and inguinal ligament along its lower edge. The external oblique fascia was split along the course of its fibers, exposing the inguinal canal.  As we did this, we identified what appeared to be very old silk sutures in the fascia.  The cord and nerve were looped using a Penrose drain and reflected out of the field.  As the  structures were being isolated, dense adhesion and scar was appreciated, particularly along the shelving edge of the inguinal ligament.  This suggested prior surgical intervention at this location.  There was a large cord lipoma that was dissected off of the cord structures.  It had worked its way between the vasculature and the vas deferens and the inguinal nerve was similarly wrapped around this fat.  Once the fat was dissected back to the internal ring, I began to dissect through it in order to excise it.  As I did this, I encountered a very small hernia sac.  This was opened and there was no intestine within it.  The sac was suture-ligated and returned to the abdominal cavity.  A right sided ProGrip mesh was trimmed to the appropriate size.  It was secured to the pubic tubercle with 0 Ethibond suture.  It was then placed along the shelving edge of the inguinal ligament laterally and the conjoined tendon medially.  The cord structures were brought through the split in the mesh and the mesh secured to itself.  The ends were tucked underneath the external oblique fascia, and the cord contents returned to the canal.  The external oblique fascia was closed with running 2-0 Vicryl.  Liposomal bupivacaine was then infiltrated along all of the fascial planes.  Scarpa's fascia and the deep dermal layer were closed with 3-0 Vicryl.  The skin was closed with running subcuticular Monocryl.  The skin was cleaned.  Dermabond and Steri-Strips were applied.  The patient was awakened, extubated, and taken to the postanesthesia care unit in good condition.  Instrument, sponge, and needle counts were correct prior to closure and at the conclusion of the case.  Findings: Hernia  as above  Estimated Blood Loss: Minimal         Drains: None         Total IV Fluids: See anesthesia record         Specimens: None               Complications: None immediately apparent         Disposition: To the postanesthesia care unit  with subsequent discharge to home         Condition: stable

## 2020-06-01 NOTE — Anesthesia Preprocedure Evaluation (Addendum)
Anesthesia Evaluation  Patient identified by MRN, date of birth, ID band Patient awake    Reviewed: Allergy & Precautions, NPO status , Patient's Chart, lab work & pertinent test results  History of Anesthesia Complications Negative for: history of anesthetic complications  Airway Mallampati: III  TM Distance: >3 FB Neck ROM: Full    Dental  (+) Teeth Intact, Poor Dentition, Dental Advisory Given   Pulmonary neg pulmonary ROS, neg sleep apnea, neg COPD, Patient abstained from smoking.Not current smoker,    Pulmonary exam normal breath sounds clear to auscultation       Cardiovascular Exercise Tolerance: Good METShypertension, + CAD and + CABG  (-) dysrhythmias  Rhythm:Regular Rate:Normal - Systolic murmurs Pharm stress test 2016: LVEF= 57%   FINDINGS:  Regional wall motion:reveals normal myocardial thickening and wall  motion.  The overall quality of the study is good.  Artifacts noted: no  Left ventricular cavity: normal.   Perfusion Analysis:SPECT images demonstrate homogeneous tracer  distribution throughout the myocardium.    Neuro/Psych negative neurological ROS  negative psych ROS   GI/Hepatic GERD  Controlled,(+)     (-) substance abuse  ,   Endo/Other  diabetes  Renal/GU CRFRenal disease     Musculoskeletal   Abdominal   Peds  Hematology   Anesthesia Other Findings Past Medical History: No date: Allergy No date: Arthritis No date: CAD (coronary artery disease) No date: Chronic kidney disease     Comment:  pt is not on dialysis No date: Diabetes mellitus without complication (HCC) No date: GERD (gastroesophageal reflux disease) No date: Hyperlipidemia No date: Hypertension  Reproductive/Obstetrics                            Anesthesia Physical Anesthesia Plan  ASA: III  Anesthesia Plan: General   Post-op Pain Management:    Induction:  Intravenous  PONV Risk Score and Plan: 3 and Ondansetron, Dexamethasone and Treatment may vary due to age or medical condition  Airway Management Planned: Oral ETT  Additional Equipment: None  Intra-op Plan:   Post-operative Plan: Extubation in OR  Informed Consent: I have reviewed the patients History and Physical, chart, labs and discussed the procedure including the risks, benefits and alternatives for the proposed anesthesia with the patient or authorized representative who has indicated his/her understanding and acceptance.     Dental advisory given  Plan Discussed with: CRNA and Surgeon  Anesthesia Plan Comments: (Discussed risks of anesthesia with patient, including PONV, sore throat, lip/dental damage. Rare risks discussed as well, such as cardiorespiratory and neurological sequelae. Patient understands.)        Anesthesia Quick Evaluation

## 2020-06-01 NOTE — OR Nursing (Signed)
Son here in postop for d/c instructions and transportation home.  Son discussed with sister [name] about staying with patient overnight, states she will do.

## 2020-06-01 NOTE — OR Nursing (Signed)
Patient drove himself to hospital, unable to reach son, contacted Juliann Pulse (daughter) states she can transport patient home if unable to reach her brother.

## 2020-06-02 ENCOUNTER — Encounter: Payer: Self-pay | Admitting: General Surgery

## 2020-06-03 ENCOUNTER — Telehealth: Payer: Self-pay

## 2020-06-03 NOTE — Telephone Encounter (Signed)
Patient called stating he noticed some redness at the tip of his penis, but denies having any pain or fevers with the redness. Patient was advised to take Ibuprofen and Tylenol to help with the inflammation as that it is common with the type of surgery he had with Dr.Cannon on 06/01/20. Patient states he just wanted to call the office and make sure with the weekend being here. Patient was advised to give our office a call if he notices any symptoms of fever or odor coming from the incision sites, patient denies having any of those problems currently. Patient verbalized understanding and has no further questions.

## 2020-06-23 ENCOUNTER — Ambulatory Visit (INDEPENDENT_AMBULATORY_CARE_PROVIDER_SITE_OTHER): Payer: Medicare HMO | Admitting: General Surgery

## 2020-06-23 ENCOUNTER — Encounter: Payer: Self-pay | Admitting: General Surgery

## 2020-06-23 ENCOUNTER — Other Ambulatory Visit: Payer: Self-pay

## 2020-06-23 VITALS — BP 151/79 | HR 82 | Temp 98.0°F | Resp 12 | Ht 71.0 in | Wt 180.0 lb

## 2020-06-23 DIAGNOSIS — Z8719 Personal history of other diseases of the digestive system: Secondary | ICD-10-CM

## 2020-06-23 DIAGNOSIS — Z9889 Other specified postprocedural states: Secondary | ICD-10-CM

## 2020-06-23 NOTE — Patient Instructions (Signed)
Follow up as needed, call the office if you have any questions or concerns.   GENERAL POST-OPERATIVE PATIENT INSTRUCTIONS   WOUND CARE INSTRUCTIONS:  Keep a dry clean dressing on the wound if there is drainage. The initial bandage may be removed after 24 hours.  Once the wound has quit draining you may leave it open to air.  If clothing rubs against the wound or causes irritation and the wound is not draining you may cover it with a dry dressing during the daytime.  Try to keep the wound dry and avoid ointments on the wound unless directed to do so.  If the wound becomes bright red and painful or starts to drain infected material that is not clear, please contact your physician immediately.  If the wound is mildly pink and has a thick firm ridge underneath it, this is normal, and is referred to as a healing ridge.  This will resolve over the next 4-6 weeks.  BATHING: You may shower if you have been informed of this by your surgeon. However, Please do not submerge in a tub, hot tub, or pool until incisions are completely sealed or have been told by your surgeon that you may do so.  DIET:  You may eat any foods that you can tolerate.  It is a good idea to eat a high fiber diet and take in plenty of fluids to prevent constipation.  If you do become constipated you may want to take a mild laxative or take ducolax tablets on a daily basis until your bowel habits are regular.  Constipation can be very uncomfortable, along with straining, after recent surgery.  ACTIVITY:  You are encouraged to cough and deep breath or use your incentive spirometer if you were given one, every 15-30 minutes when awake.  This will help prevent respiratory complications and low grade fevers post-operatively if you had a general anesthetic.  You may want to hug a pillow when coughing and sneezing to add additional support to the surgical area, if you had abdominal or chest surgery, which will decrease pain during these times.  You  are encouraged to walk and engage in light activity for the next two weeks.  You should not lift more than 10 pounds, 6 weeks after surgery as it could put you at increased risk for complications.  Twenty pounds is roughly equivalent to a plastic bag of groceries. At that time- Listen to your body when lifting, if you have pain when lifting, stop and then try again in a few days. Soreness after doing exercises or activities of daily living is normal as you get back in to your normal routine.  MEDICATIONS:  Try to take narcotic medications and anti-inflammatory medications, such as tylenol, ibuprofen, naprosyn, etc., with food.  This will minimize stomach upset from the medication.  Should you develop nausea and vomiting from the pain medication, or develop a rash, please discontinue the medication and contact your physician.  You should not drive, make important decisions, or operate machinery when taking narcotic pain medication.  SUNBLOCK Use sun block to incision area over the next year if this area will be exposed to sun. This helps decrease scarring and will allow you avoid a permanent darkened area over your incision.  QUESTIONS:  Please feel free to call our office if you have any questions, and we will be glad to assist you.

## 2020-06-23 NOTE — Progress Notes (Signed)
Ryan Kirby is here today for follow-up of an open right inguinal hernia repair on June 01, 2020.  Intraoperatively, it was discovered that he had had a prior primary tissue hernia repair.  Mesh was implanted at the most recent procedure.  He states that after surgery, he had significant bruising of his scrotum, but this has since resolved.  He continues to appreciate some swelling in the surgical site.  He denies any fevers or chills.  He did not require any pain medication.  There is no drainage or redness reported at the surgical site.  He has a good appetite, he is moving his bowels, and he denies any difficulty with urination.  Today's Vitals   06/23/20 0855  BP: (!) 151/79  Pulse: 82  Resp: 12  Temp: 98 F (36.7 C)  SpO2: 99%  Weight: 180 lb (81.6 kg)  Height: 5\' 11"  (1.803 m)   Body mass index is 25.1 kg/m. Focused examination demonstrates that the Steri-Strips are still in place.  These were removed to reveal a well approximated transverse incision.  There is no erythema or drainage.  He does have a modest amount of expected postsurgical swelling present, but this is nontender.  Impression and plan: This is a 79 year old man who is doing well after open repair of a recurrent right inguinal hernia.  He should continue to refrain from lifting, pushing, or pulling anything heavier than 10 pounds for another 3 weeks.  He may otherwise resume all of his usual activities.  I will see him on an as-needed basis.

## 2020-07-05 DIAGNOSIS — E089 Diabetes mellitus due to underlying condition without complications: Secondary | ICD-10-CM | POA: Diagnosis not present

## 2020-07-05 DIAGNOSIS — E119 Type 2 diabetes mellitus without complications: Secondary | ICD-10-CM | POA: Diagnosis not present

## 2020-07-05 DIAGNOSIS — H524 Presbyopia: Secondary | ICD-10-CM | POA: Diagnosis not present

## 2020-07-05 DIAGNOSIS — H40022 Open angle with borderline findings, high risk, left eye: Secondary | ICD-10-CM | POA: Diagnosis not present

## 2020-07-05 DIAGNOSIS — D3131 Benign neoplasm of right choroid: Secondary | ICD-10-CM | POA: Diagnosis not present

## 2020-07-23 ENCOUNTER — Other Ambulatory Visit: Payer: Self-pay | Admitting: Family Medicine

## 2020-07-23 DIAGNOSIS — E119 Type 2 diabetes mellitus without complications: Secondary | ICD-10-CM

## 2020-07-23 DIAGNOSIS — I2510 Atherosclerotic heart disease of native coronary artery without angina pectoris: Secondary | ICD-10-CM

## 2020-07-23 DIAGNOSIS — I2583 Coronary atherosclerosis due to lipid rich plaque: Secondary | ICD-10-CM

## 2020-07-23 DIAGNOSIS — I10 Essential (primary) hypertension: Secondary | ICD-10-CM

## 2020-07-23 DIAGNOSIS — E7800 Pure hypercholesterolemia, unspecified: Secondary | ICD-10-CM

## 2020-08-18 ENCOUNTER — Ambulatory Visit: Payer: Medicare HMO | Admitting: Unknown Physician Specialty

## 2020-08-18 ENCOUNTER — Encounter: Payer: Medicare HMO | Admitting: Family Medicine

## 2020-08-18 ENCOUNTER — Ambulatory Visit (INDEPENDENT_AMBULATORY_CARE_PROVIDER_SITE_OTHER): Payer: Medicare HMO | Admitting: Nurse Practitioner

## 2020-08-18 ENCOUNTER — Encounter: Payer: Self-pay | Admitting: Nurse Practitioner

## 2020-08-18 ENCOUNTER — Other Ambulatory Visit: Payer: Self-pay

## 2020-08-18 VITALS — BP 130/64 | HR 68 | Temp 97.6°F | Ht 70.08 in | Wt 186.2 lb

## 2020-08-18 DIAGNOSIS — I152 Hypertension secondary to endocrine disorders: Secondary | ICD-10-CM | POA: Diagnosis not present

## 2020-08-18 DIAGNOSIS — E876 Hypokalemia: Secondary | ICD-10-CM

## 2020-08-18 DIAGNOSIS — E785 Hyperlipidemia, unspecified: Secondary | ICD-10-CM

## 2020-08-18 DIAGNOSIS — E1169 Type 2 diabetes mellitus with other specified complication: Secondary | ICD-10-CM | POA: Diagnosis not present

## 2020-08-18 DIAGNOSIS — Z23 Encounter for immunization: Secondary | ICD-10-CM | POA: Diagnosis not present

## 2020-08-18 DIAGNOSIS — N183 Chronic kidney disease, stage 3 unspecified: Secondary | ICD-10-CM | POA: Diagnosis not present

## 2020-08-18 DIAGNOSIS — E1122 Type 2 diabetes mellitus with diabetic chronic kidney disease: Secondary | ICD-10-CM | POA: Diagnosis not present

## 2020-08-18 DIAGNOSIS — E1159 Type 2 diabetes mellitus with other circulatory complications: Secondary | ICD-10-CM | POA: Diagnosis not present

## 2020-08-18 DIAGNOSIS — I739 Peripheral vascular disease, unspecified: Secondary | ICD-10-CM | POA: Insufficient documentation

## 2020-08-18 HISTORY — DX: Hypokalemia: E87.6

## 2020-08-18 LAB — MICROALBUMIN, URINE WAIVED
Creatinine, Urine Waived: 50 mg/dL (ref 10–300)
Microalb, Ur Waived: 10 mg/L (ref 0–19)

## 2020-08-18 LAB — BAYER DCA HB A1C WAIVED: HB A1C (BAYER DCA - WAIVED): 6.7 % (ref <7.0)

## 2020-08-18 MED ORDER — METFORMIN HCL 500 MG PO TABS
500.0000 mg | ORAL_TABLET | Freq: Two times a day (BID) | ORAL | 4 refills | Status: DC
Start: 2020-08-18 — End: 2020-08-18

## 2020-08-18 MED ORDER — TRIAMCINOLONE ACETONIDE 0.1 % EX CREA: 1.0000 "application " | TOPICAL_CREAM | Freq: Two times a day (BID) | CUTANEOUS | 0 refills | Status: DC

## 2020-08-18 NOTE — Assessment & Plan Note (Signed)
Chronic, ongoing with A1c 6.7% today, downward trend from previous.  Will check urine ALB and BMP today.  Continue current medication regimen and adjust as needed based on A1c and kidney function.  Recommend he monitor BS at home a few days a week and document.  Focus on diet changes. Return to office in 3 months to meet new PCP.

## 2020-08-18 NOTE — Assessment & Plan Note (Signed)
Noted on exam with dermatitis, send in Triamcinolone cream for treatment and monitor closely for wounds, discussed and educated patient.

## 2020-08-18 NOTE — Progress Notes (Signed)
BP 130/64   Pulse 68   Temp 97.6 F (36.4 C) (Oral)   Ht 5' 10.08" (1.78 m)   Wt 186 lb 3.2 oz (84.5 kg)   SpO2 98%   BMI 26.66 kg/m    Subjective:    Patient ID: Ryan Kirby, male    DOB: [birth year], 79 y.o.   MRN: [id number]  HPI: Ryan Kirby is a 79 y.o. male  Chief Complaint  Patient presents with  . Diabetes   DIABETES Last A1c 7.5% in June.  Taking Glipizide, Jardiance, Metformin.  He does endorse loving bread and sweets.  Reports he has enough refills at home on his medications. Hypoglycemic episodes:no Polydipsia/polyuria: no Visual disturbance: no Chest pain: no Paresthesias: no Glucose Monitoring: no  Accucheck frequency: Not Checking  Fasting glucose:  Post prandial:  Evening:  Before meals: Taking Insulin?: no  Long acting insulin:  Short acting insulin: Blood Pressure Monitoring: not checking Retinal Examination: Up to Date Foot Exam: Up to Date Pneumovax: Up to Date Influenza: Up to Date Aspirin: yes   HYPERTENSION / HYPERLIPIDEMIA Continues on Pravastatin, Carvedilol, Benazepril-HCTZ, Amlodipine, Cardura, and Clonidine.   Satisfied with current treatment? yes Duration of hypertension: chronic BP monitoring frequency: not checking BP range:  BP medication side effects: no Duration of hyperlipidemia: chronic Cholesterol medication side effects: no Cholesterol supplements: none Medication compliance: good compliance Aspirin: yes Recent stressors: no Recurrent headaches: no Visual changes: no Palpitations: no Dyspnea: no Chest pain: no Lower extremity edema: no Dizzy/lightheaded: no   CHRONIC KIDNEY DISEASE September CRT 1.37 and GFR 49. CKD status: stable Medications renally dose: yes Previous renal evaluation: no Pneumovax:  Up to Date Influenza Vaccine:  Up to Date  Relevant past medical, surgical, family and social history reviewed and updated as indicated. Interim medical history since our last visit reviewed. Allergies  and medications reviewed and updated.  Review of Systems  Constitutional: Negative for activity change, diaphoresis, fatigue and fever.  Respiratory: Negative for cough, chest tightness, shortness of breath and wheezing.   Cardiovascular: Negative for chest pain, palpitations and leg swelling.  Gastrointestinal: Negative.   Neurological: Negative.   Psychiatric/Behavioral: Negative.     Per HPI unless specifically indicated above     Objective:    BP 130/64   Pulse 68   Temp 97.6 F (36.4 C) (Oral)   Ht 5' 10.08" (1.78 m)   Wt 186 lb 3.2 oz (84.5 kg)   SpO2 98%   BMI 26.66 kg/m   Wt Readings from Last 3 Encounters:  08/18/20 186 lb 3.2 oz (84.5 kg)  06/23/20 180 lb (81.6 kg)  05/26/20 176 lb 9.6 oz (80.1 kg)    Physical Exam Vitals and nursing note reviewed.  Constitutional:      General: He is awake. He is not in acute distress.    Appearance: He is well-developed and well-groomed. He is not ill-appearing.  HENT:     Head: Normocephalic and atraumatic.     Right Ear: Hearing normal. No drainage.     Left Ear: Hearing normal. No drainage.  Eyes:     General: Lids are normal.        Right eye: No discharge.        Left eye: No discharge.     Conjunctiva/sclera: Conjunctivae normal.     Pupils: Pupils are equal, round, and reactive to light.  Neck:     Thyroid: No thyromegaly.     Vascular: No carotid bruit or JVD.  Trachea: Trachea normal.  Cardiovascular:     Rate and Rhythm: Normal rate and regular rhythm.     Heart sounds: Normal heart sounds, S1 normal and S2 normal. No murmur heard. No gallop.   Pulmonary:     Effort: Pulmonary effort is normal.     Breath sounds: Normal breath sounds.  Abdominal:     General: Bowel sounds are normal.     Palpations: Abdomen is soft. There is no hepatomegaly or splenomegaly.  Musculoskeletal:        General: Normal range of motion.     Cervical back: Normal range of motion and neck supple.     Right lower leg: No  edema.     Left lower leg: No edema.  Skin:    General: Skin is warm and dry.     Capillary Refill: Capillary refill takes less than 2 seconds.     Comments: Hemosiderin staining to bilateral shins with dry, scaling, patchy skin.  Intact no wounds noted.  Neurological:     Mental Status: He is alert and oriented to person, place, and time.     Deep Tendon Reflexes: Reflexes are normal and symmetric.  Psychiatric:        Attention and Perception: Attention normal.        Mood and Affect: Mood normal.        Speech: Speech normal.        Behavior: Behavior normal. Behavior is cooperative.        Thought Content: Thought content normal.     Results for orders placed or performed during the hospital encounter of 06/01/20  Glucose, capillary  Result Value Ref Range   Glucose-Capillary 201 (H) 70 - 99 mg/dL  I-STAT, chem 8  Result Value Ref Range   Sodium 140 135 - 145 mmol/L   Potassium 3.5 3.5 - 5.1 mmol/L   Chloride 102 98 - 111 mmol/L   BUN 22 8 - 23 mg/dL   Creatinine, Ser 1.30 (H) 0.61 - 1.24 mg/dL   Glucose, Bld 234 (H) 70 - 99 mg/dL   Calcium, Ion 1.31 1.15 - 1.40 mmol/L   TCO2 25 22 - 32 mmol/L   Hemoglobin 13.6 13.0 - 17.0 g/dL   HCT 40.0 39.0 - 52.0 %      Assessment & Plan:   Problem List Items Addressed This Visit      Cardiovascular and Mediastinum   Hypertension associated with diabetes ([place])    Chronic, ongoing with initial SBP elevated, but on recheck improved and below goal.  Continue current medication regimen at this time and adjust as needed.  Monitor closely for hypotension due to his advanced age and may need to minimize medications.  Recommend he monitor BP daily and document at home + focus on DASH diet.  Return in 3 months to meet new PCP.  BMP and TSH today.      PVD (peripheral vascular disease) ([place])    Noted on exam with dermatitis, send in Triamcinolone cream for treatment and monitor closely for wounds, discussed and educated patient.         Endocrine   Hyperlipidemia associated with type 2 diabetes mellitus ([facility])    Chronic, ongoing.  Continue current medication regimen and adjust as needed.  Lipid panel today.         Relevant Orders   Lipid Panel w/o Chol/HDL Ratio   CKD stage 3 secondary to diabetes ([place]) - Primary    Chronic, ongoing with A1c 6.7%  today, downward trend from previous.  Will check urine ALB and BMP today.  Continue current medication regimen and adjust as needed based on A1c and kidney function.  Recommend he monitor BS at home a few days a week and document.  Focus on diet changes. Return to office in 3 months to meet new PCP.      Relevant Orders   Bayer DCA Hb A1c Waived (STAT)   TSH   Microalbumin, Urine Waived   Basic metabolic panel     Other   Hypokalemia    Noted on past labs, continue supplements and adjust regimen as needed.  Check level today.       Other Visit Diagnoses    Need for influenza vaccination       Relevant Orders   Flu Vaccine QUAD High Dose(Fluad) (Completed)       Follow up plan: Return in about 3 months (around 11/16/2020) for T2DM, HTN/HLD, GERD, PVD -- meet new PCP.

## 2020-08-18 NOTE — Patient Instructions (Signed)

## 2020-08-18 NOTE — Assessment & Plan Note (Signed)
Chronic, ongoing with initial SBP elevated, but on recheck improved and below goal.  Continue current medication regimen at this time and adjust as needed.  Monitor closely for hypotension due to his advanced age and may need to minimize medications.  Recommend he monitor BP daily and document at home + focus on DASH diet.  Return in 3 months to meet new PCP.  BMP and TSH today.

## 2020-08-18 NOTE — Assessment & Plan Note (Signed)
Chronic, ongoing.  Continue current medication regimen and adjust as needed. Lipid panel today. 

## 2020-08-18 NOTE — Assessment & Plan Note (Signed)
Noted on past labs, continue supplements and adjust regimen as needed.  Check level today.

## 2020-08-19 LAB — BASIC METABOLIC PANEL
BUN/Creatinine Ratio: 13 (ref 10–24)
BUN: 17 mg/dL (ref 8–27)
CO2: 26 mmol/L (ref 20–29)
Calcium: 10.7 mg/dL — ABNORMAL HIGH (ref 8.6–10.2)
Chloride: 100 mmol/L (ref 96–106)
Creatinine, Ser: 1.27 mg/dL (ref 0.76–1.27)
GFR calc Af Amer: 62 mL/min/{1.73_m2} (ref >59)
GFR calc non Af Amer: 54 mL/min/{1.73_m2} — ABNORMAL LOW (ref >59)
Glucose: 115 mg/dL — ABNORMAL HIGH (ref 65–99)
Potassium: 3.5 mmol/L (ref 3.5–5.2)
Sodium: 140 mmol/L (ref 134–144)

## 2020-08-19 LAB — LIPID PANEL W/O CHOL/HDL RATIO
Cholesterol, Total: 164 mg/dL (ref 100–199)
HDL: 57 mg/dL (ref >39)
LDL Chol Calc (NIH): 78 mg/dL (ref 0–99)
Triglycerides: 172 mg/dL — ABNORMAL HIGH (ref 0–149)
VLDL Cholesterol Cal: 29 mg/dL (ref 5–40)

## 2020-08-19 LAB — TSH: TSH: 3.26 u[IU]/mL (ref 0.450–4.500)

## 2020-08-19 NOTE — Progress Notes (Signed)
Good afternoon, please let Mr. Hildebran know his labs have returned.  Kidney function continues to show some mild kidney disease with no decline, we will continue to monitor this.  Cholesterol levels are very close to goal, continue Pravastatin daily.  Thyroid is normal.  Calcium level was a little elevated, we will recheck this next visit and may add on another lab to further assess as well next visit.  If any questions let me know!!  Have a great weekend!! Keep being awesome!!  Thank you for allowing me to participate in your care. Kindest regards, Quentyn Kolbeck

## 2020-08-19 NOTE — Progress Notes (Signed)
Patient notified

## 2020-10-03 ENCOUNTER — Other Ambulatory Visit: Payer: Self-pay | Admitting: Nurse Practitioner

## 2020-10-03 ENCOUNTER — Other Ambulatory Visit: Payer: Self-pay | Admitting: Family Medicine

## 2020-10-03 DIAGNOSIS — I2510 Atherosclerotic heart disease of native coronary artery without angina pectoris: Secondary | ICD-10-CM

## 2020-10-03 DIAGNOSIS — E119 Type 2 diabetes mellitus without complications: Secondary | ICD-10-CM

## 2020-10-03 DIAGNOSIS — E7800 Pure hypercholesterolemia, unspecified: Secondary | ICD-10-CM

## 2020-10-03 DIAGNOSIS — I10 Essential (primary) hypertension: Secondary | ICD-10-CM

## 2020-10-04 NOTE — Telephone Encounter (Signed)
Requested medications are due for refill today.  unknown  Requested medications are on the active medications list.  no  Last refill.was removed from med list 08/17/2020  Future visit scheduled.   yes  Notes to clinic.

## 2020-10-04 NOTE — Telephone Encounter (Signed)
Requested Prescriptions  Pending Prescriptions Disp Refills  . benazepril-hydrochlorthiazide (LOTENSIN HCT) 20-12.5 MG tablet [Pharmacy Med Name: BENAZEPRIL HCL/HYDROCHLOROTHIAZIDE 20-12.5 MG Tablet] 90 tablet 0    Sig: TAKE 1 TABLET EVERY DAY     Cardiovascular:  ACEI + Diuretic Combos Failed - 10/03/2020  3:50 PM      Failed - Ca in normal range and within 180 days    Calcium  Date Value Ref Range Status  08/18/2020 10.7 (H) 8.6 - 10.2 mg/dL Final   Calcium, Ion  Date Value Ref Range Status  06/01/2020 1.31 1.15 - 1.40 mmol/L Final         Passed - Na in normal range and within 180 days    Sodium  Date Value Ref Range Status  08/18/2020 140 134 - 144 mmol/L Final         Passed - K in normal range and within 180 days    Potassium  Date Value Ref Range Status  08/18/2020 3.5 3.5 - 5.2 mmol/L Final         Passed - Cr in normal range and within 180 days    Creatinine, Ser  Date Value Ref Range Status  08/18/2020 1.27 0.76 - 1.27 mg/dL Final         Passed - Patient is not pregnant      Passed - Last BP in normal range    BP Readings from Last 1 Encounters:  08/18/20 130/64         Passed - Valid encounter within last 6 months    Recent Outpatient Visits          1 month ago CKD stage 3 secondary to diabetes [facility])   [place], Henrine Screws T, NP   4 months ago Preoperative clearance   [facility] Eulogio Bear, NP   4 months ago Non-recurrent unilateral inguinal hernia without obstruction or gangrene   [facility] Eulogio Bear, NP   7 months ago Coronary artery disease due to lipid rich plaque   [place], [place], [place]   8 months ago Upper respiratory tract infection, unspecified type   [facility], Lilia Argue, [place]      Future Appointments            In 6 months [place], [facility]            . metFORMIN (GLUCOPHAGE) 500 MG tablet  [Pharmacy Med Name: METFORMIN HYDROCHLORIDE 500 MG Tablet] 180 tablet 0    Sig: TAKE 1 TABLET TWICE DAILY     Endocrinology:  Diabetes - Biguanides Failed - 10/03/2020  3:50 PM      Failed - eGFR in normal range and within 360 days    GFR calc Af Amer  Date Value Ref Range Status  08/18/2020 62 >59 mL/min/1.73 Final    Comment:    **In accordance with recommendations from the NKF-ASN Task force,**   [facility] is in the process of updating its eGFR calculation to the   2021 CKD-EPI creatinine equation that estimates kidney function   without a race variable.    GFR calc non Af Amer  Date Value Ref Range Status  08/18/2020 54 (L) >59 mL/min/1.73 Final         Passed - Cr in normal range and within 360 days    Creatinine, Ser  Date Value Ref Range Status  08/18/2020 1.27 0.76 - 1.27 mg/dL Final  Passed - HBA1C is between 0 and 7.9 and within 180 days    HB A1C (BAYER DCA - WAIVED)  Date Value Ref Range Status  08/18/2020 6.7 <7.0 % Final    Comment:                                          Diabetic Adult            <7.0                                       Healthy Adult        4.3 - 5.7                                                           (DCCT/NGSP) American Diabetes Association's Summary of Glycemic Recommendations for Adults with Diabetes: Hemoglobin A1c <7.0%. More stringent glycemic goals (A1c <6.0%) may further reduce complications at the cost of increased risk of hypoglycemia.          Passed - Valid encounter within last 6 months    Recent Outpatient Visits          1 month ago CKD stage 3 secondary to diabetes [facility])   [place], Henrine Screws T, NP   4 months ago Preoperative clearance   [facility] Noemi Chapel A, NP   4 months ago Non-recurrent unilateral inguinal hernia without obstruction or gangrene   [facility] Noemi Chapel A, NP   7 months ago Coronary artery disease due to lipid rich plaque    [place], [place], [place]   8 months ago Upper respiratory tract infection, unspecified type   [facility], Lilia Argue, [place]      Future Appointments            In 6 months [facility], [place]

## 2020-10-04 NOTE — Telephone Encounter (Signed)
Requested Prescriptions  Pending Prescriptions Disp Refills  . amLODipine (NORVASC) 10 MG tablet [Pharmacy Med Name: AMLODIPINE BESYLATE 10 MG Tablet] 90 tablet 0    Sig: TAKE 1 TABLET EVERY DAY     Cardiovascular:  Calcium Channel Blockers Passed - 10/04/2020  3:03 AM      Passed - Last BP in normal range    BP Readings from Last 1 Encounters:  08/18/20 130/64         Passed - Valid encounter within last 6 months    Recent Outpatient Visits          1 month ago CKD stage 3 secondary to diabetes [facility])   [place], Henrine Screws T, NP   4 months ago Preoperative clearance   [facility] Noemi Chapel A, NP   4 months ago Non-recurrent unilateral inguinal hernia without obstruction or gangrene   [facility] Noemi Chapel A, NP   7 months ago Coronary artery disease due to lipid rich plaque   [place], [place], [place]   8 months ago Upper respiratory tract infection, unspecified type   [facility], Lilia Argue, [place]      Future Appointments            In 6 months [facility], [place]            . glipiZIDE (GLUCOTROL) 5 MG tablet [Pharmacy Med Name: GLIPIZIDE 5 MG Tablet] 90 tablet 0    Sig: TAKE 1 TABLET DAILY BEFORE BREAKFAST     Endocrinology:  Diabetes - Sulfonylureas Passed - 10/03/2020  3:50 PM      Passed - HBA1C is between 0 and 7.9 and within 180 days    HB A1C (BAYER DCA - WAIVED)  Date Value Ref Range Status  08/18/2020 6.7 <7.0 % Final    Comment:                                          Diabetic Adult            <7.0                                       Healthy Adult        4.3 - 5.7                                                           (DCCT/NGSP) American Diabetes Association's Summary of Glycemic Recommendations for Adults with Diabetes: Hemoglobin A1c <7.0%. More stringent glycemic goals (A1c <6.0%) may further reduce complications at the cost  of increased risk of hypoglycemia.          Passed - Valid encounter within last 6 months    Recent Outpatient Visits          1 month ago CKD stage 3 secondary to diabetes [facility])   [place], Henrine Screws T, NP   4 months ago Preoperative clearance   [facility] Noemi Chapel A, NP   4 months ago Non-recurrent unilateral inguinal hernia without obstruction or gangrene  [facility] Noemi Chapel A, NP   7 months ago Coronary artery disease due to lipid rich plaque   [place], [place], [place]   8 months ago Upper respiratory tract infection, unspecified type   [facility], Lilia Argue, [place]      Future Appointments            In 6 months [place], [facility]            . cloNIDine (CATAPRES) 0.3 MG tablet [Pharmacy Med Name: CLONIDINE HYDROCHLORIDE 0.3 MG Tablet] 180 tablet 0    Sig: TAKE 1 TABLET TWICE DAILY     Cardiovascular:  Alpha-2 Agonists Passed - 10/03/2020  3:50 PM      Passed - Last BP in normal range    BP Readings from Last 1 Encounters:  08/18/20 130/64         Passed - Last Heart Rate in normal range    Pulse Readings from Last 1 Encounters:  08/18/20 68         Passed - Valid encounter within last 6 months    Recent Outpatient Visits          1 month ago CKD stage 3 secondary to diabetes [facility])   [place], Henrine Screws T, NP   4 months ago Preoperative clearance   [facility] Noemi Chapel A, NP   4 months ago Non-recurrent unilateral inguinal hernia without obstruction or gangrene   [facility] Noemi Chapel A, NP   7 months ago Coronary artery disease due to lipid rich plaque   [place], [place], [place]   8 months ago Upper respiratory tract infection, unspecified type   [facility], Lilia Argue, [place]      Future Appointments             In 6 months [place], [facility]            . carvedilol (COREG) 25 MG tablet [Pharmacy Med Name: CARVEDILOL 25 MG Tablet] 360 tablet 0    Sig: TAKE 2 TABLETS TWICE DAILY     Cardiovascular:  Beta Blockers Passed - 10/03/2020  3:50 PM      Passed - Last BP in normal range    BP Readings from Last 1 Encounters:  08/18/20 130/64         Passed - Last Heart Rate in normal range    Pulse Readings from Last 1 Encounters:  08/18/20 68         Passed - Valid encounter within last 6 months    Recent Outpatient Visits          1 month ago CKD stage 3 secondary to diabetes [facility])   [place], Henrine Screws T, NP   4 months ago Preoperative clearance   [facility] Noemi Chapel A, NP   4 months ago Non-recurrent unilateral inguinal hernia without obstruction or gangrene   [facility] Noemi Chapel A, NP   7 months ago Coronary artery disease due to lipid rich plaque   [place], [place], [place]   8 months ago Upper respiratory tract infection, unspecified type   [facility], Lilia Arg[place], [place]      Future Appointments            In 6 months [facility], [place]            .  pravastatin (PRAVACHOL) 40 MG tablet [Pharmacy Med Name: PRAVASTATIN SODIUM 40 MG Tablet] 90 tablet 0    Sig: TAKE 1 TABLET AT BEDTIME     Cardiovascular:  Antilipid - Statins Failed - 10/03/2020  3:50 PM      Failed - LDL in normal range and within 360 days    LDL Chol Calc (NIH)  Date Value Ref Range Status  08/18/2020 78 0 - 99 mg/dL Final         Failed - Triglycerides in normal range and within 360 days    Triglycerides  Date Value Ref Range Status  08/18/2020 172 (H) 0 - 149 mg/dL Final   Triglycerides Piccolo,Waived  Date Value Ref Range Status  07/02/2018 124 <150 mg/dL Final    Comment:                            Normal                   <150                          Borderline High     150 - 199                         High                200 - 499                         Very High                >499          Passed - Total Cholesterol in normal range and within 360 days    Cholesterol, Total  Date Value Ref Range Status  08/18/2020 164 100 - 199 mg/dL Final   Cholesterol Piccolo, Waived  Date Value Ref Range Status  07/02/2018 143 <200 mg/dL Final    Comment:                            Desirable                <200                         Borderline High      200- 239                         High                     >239          Passed - HDL in normal range and within 360 days    HDL  Date Value Ref Range Status  08/18/2020 57 >39 mg/dL Final         Passed - Patient is not pregnant      Passed - Valid encounter within last 12 months    Recent Outpatient Visits          1 month ago CKD stage 3 secondary to diabetes [facility])   [place], Henrine Screws T, NP   4 months ago Preoperative clearance   [facility] Noemi Chapel A, NP   4 months ago Non-recurrent  unilateral inguinal hernia without obstruction or gangrene   [facility] Eulogio Bear, NP   7 months ago Coronary artery disease due to lipid rich plaque   [place], [place], [place]   8 months ago Upper respiratory tract infection, unspecified type   [facility], Lilia [place], [place]      Future Appointments            In 6 months [place], [facility]            . furosemide (LASIX) 40 MG tablet [Pharmacy Med Name: FUROSEMIDE 40 MG Tablet] 90 tablet 0    Sig: TAKE 1 TABLET EVERY DAY     Cardiovascular:  Diuretics - Loop Failed - 10/03/2020  3:50 PM      Failed - Ca in normal range and within 360 days    Calcium  Date Value Ref Range Status  08/18/2020 10.7 (H) 8.6 - 10.2 mg/dL Final   Calcium, Ion  Date Value Ref Range Status  06/01/2020 1.31 1.15 - 1.40  mmol/L Final         Passed - K in normal range and within 360 days    Potassium  Date Value Ref Range Status  08/18/2020 3.5 3.5 - 5.2 mmol/L Final         Passed - Na in normal range and within 360 days    Sodium  Date Value Ref Range Status  08/18/2020 140 134 - 144 mmol/L Final         Passed - Cr in normal range and within 360 days    Creatinine, Ser  Date Value Ref Range Status  08/18/2020 1.27 0.76 - 1.27 mg/dL Final         Passed - Last BP in normal range    BP Readings from Last 1 Encounters:  08/18/20 130/64         Passed - Valid encounter within last 6 months    Recent Outpatient Visits          1 month ago CKD stage 3 secondary to diabetes [facility])   [place], Henrine Screws T, NP   4 months ago Preoperative clearance   [facility] Noemi Chapel A, NP   4 months ago Non-recurrent unilateral inguinal hernia without obstruction or gangrene   [facility] Eulogio Bear, NP   7 months ago Coronary artery disease due to lipid rich plaque   [place], [place], [place]   8 months ago Upper respiratory tract infection, unspecified type   [facility], L[place], [place]      Future Appointments            In 6 months [facility], [place]            . doxazosin (CARDURA) 2 MG tablet [Pharmacy Med Name: DOXAZOSIN MESYLATE 2 MG Tablet] 90 tablet 0    Sig: TAKE 1 TABLET EVERY MORNING     Cardiovascular:  Alpha Blockers Passed - 10/03/2020  3:50 PM      Passed - Last BP in normal range    BP Readings from Last 1 Encounters:  08/18/20 130/64         Passed - Valid encounter within last 6 months    Recent Outpatient Visits          1 month ago CKD stage 3 secondary to diabetes [facility])   [place],  Barbaraann Faster, NP   4 months ago Preoperative clearance   [facility] Noemi Chapel A, NP   4 months ago Non-recurrent  unilateral inguinal hernia without obstruction or gangrene   [facility] Noemi Chapel A, NP   7 months ago Coronary artery disease due to lipid rich plaque   [place], [place], [place]   8 months ago Upper respiratory tract infection, unspecified type   [facility], Lilia Argue, [place]      Future Appointments            In 6 months [facility], [place]            . potassium chloride SA (KLOR-CON) 20 MEQ tablet [Pharmacy Med Name: POTASSIUM CHLORIDE ER 20 MEQ Tablet Extended Release] 90 tablet 0    Sig: TAKE 1 TABLET EVERY DAY     Endocrinology:  Minerals - Potassium Supplementation Passed - 10/03/2020  3:50 PM      Passed - K in normal range and within 360 days    Potassium  Date Value Ref Range Status  08/18/2020 3.5 3.5 - 5.2 mmol/L Final         Passed - Cr in normal range and within 360 days    Creatinine, Ser  Date Value Ref Range Status  08/18/2020 1.27 0.76 - 1.27 mg/dL Final         Passed - Valid encounter within last 12 months    Recent Outpatient Visits          1 month ago CKD stage 3 secondary to diabetes [facility])   [place], Henrine Screws T, NP   4 months ago Preoperative clearance   [facility] Noemi Chapel A, NP   4 months ago Non-recurrent unilateral inguinal hernia without obstruction or gangrene   [facility] Noemi Chapel A, NP   7 months ago Coronary artery disease due to lipid rich plaque   [place], [place], [place]   8 months ago Upper respiratory tract infection, unspecified type   [facility], [place], [place]      Future Appointments            In 6 months [place], [facility]            Pt is to return in march to meet new PCP.

## 2020-11-30 ENCOUNTER — Ambulatory Visit (INDEPENDENT_AMBULATORY_CARE_PROVIDER_SITE_OTHER): Payer: Medicare HMO | Admitting: Internal Medicine

## 2020-11-30 ENCOUNTER — Encounter: Payer: Self-pay | Admitting: Internal Medicine

## 2020-11-30 ENCOUNTER — Other Ambulatory Visit: Payer: Self-pay

## 2020-11-30 VITALS — Ht 70.08 in | Wt 189.0 lb

## 2020-11-30 DIAGNOSIS — M1990 Unspecified osteoarthritis, unspecified site: Secondary | ICD-10-CM

## 2020-11-30 DIAGNOSIS — I2510 Atherosclerotic heart disease of native coronary artery without angina pectoris: Secondary | ICD-10-CM

## 2020-11-30 DIAGNOSIS — R011 Cardiac murmur, unspecified: Secondary | ICD-10-CM | POA: Diagnosis not present

## 2020-11-30 DIAGNOSIS — E119 Type 2 diabetes mellitus without complications: Secondary | ICD-10-CM

## 2020-11-30 DIAGNOSIS — I2583 Coronary atherosclerosis due to lipid rich plaque: Secondary | ICD-10-CM

## 2020-11-30 LAB — BAYER DCA HB A1C WAIVED: HB A1C (BAYER DCA - WAIVED): 7.4 % — ABNORMAL HIGH (ref <7.0)

## 2020-11-30 MED ORDER — EMPAGLIFLOZIN 25 MG PO TABS: 25.0000 mg | ORAL_TABLET | Freq: Every day | ORAL | 1 refills | Status: DC

## 2020-11-30 NOTE — Progress Notes (Signed)
Ht 5' 10.08" (1.78 m)   Wt 189 lb (85.7 kg)   BMI 27.06 kg/m    Subjective:    Patient ID: Ryan Kirby, male    DOB: 1941-04-20, 80 y.o.   MRN: [id number]  HPI: CARLOSDANIEL Kirby is a 80 y.o. male  Diabetes He presents for his follow-up diabetic visit. He has type 2 diabetes mellitus.  Hyperlipidemia This is a chronic problem. The problem is uncontrolled. He has no history of chronic renal disease, diabetes, hypothyroidism or liver disease.  Hypertension This is a chronic problem. The current episode started more than 1 year ago. The problem is controlled. There is no history of chronic renal disease.  Arthritis Presents for follow-up visit. He reports no pain, stiffness, joint swelling or joint warmth. The symptoms have been stable.    Chief Complaint  Patient presents with  . Diabetes  . Hyperlipidemia  . Hypertension    Relevant past medical, surgical, family and social history reviewed and updated as indicated. Interim medical history since our last visit reviewed. Allergies and medications reviewed and updated.  Review of Systems  Musculoskeletal: Positive for arthritis. Negative for joint swelling and stiffness.    Per HPI unless specifically indicated above     Objective:    Ht 5' 10.08" (1.78 m)   Wt 189 lb (85.7 kg)   BMI 27.06 kg/m   Wt Readings from Last 3 Encounters:  11/30/20 189 lb (85.7 kg)  08/18/20 186 lb 3.2 oz (84.5 kg)  06/23/20 180 lb (81.6 kg)    Physical Exam Vitals and nursing note reviewed.  Constitutional:      General: He is not in acute distress.    Appearance: Normal appearance. He is not ill-appearing or diaphoretic.  HENT:     Head: Normocephalic and atraumatic.     Right Ear: Tympanic membrane and external ear normal. There is no impacted cerumen.     Left Ear: External ear normal.     Nose: No congestion or rhinorrhea.     Mouth/Throat:     Pharynx: No oropharyngeal exudate or posterior oropharyngeal erythema.  Eyes:      Conjunctiva/sclera: Conjunctivae normal.     Pupils: Pupils are equal, round, and reactive to light.  Cardiovascular:     Rate and Rhythm: Normal rate and regular rhythm.     Heart sounds: No murmur heard. No friction rub. No gallop.   Pulmonary:     Effort: No respiratory distress.     Breath sounds: No stridor. No wheezing or rhonchi.  Chest:     Chest wall: No tenderness.  Abdominal:     General: Abdomen is flat. Bowel sounds are normal.     Palpations: Abdomen is soft. There is no mass.     Tenderness: There is no abdominal tenderness.  Musculoskeletal:     Cervical back: Normal range of motion and neck supple. No rigidity or tenderness.     Left lower leg: No edema.  Skin:    General: Skin is warm and dry.  Neurological:     Mental Status: He is alert.     Results for orders placed or performed in visit on 08/18/20  Bayer DCA Hb A1c Waived (STAT)  Result Value Ref Range   HB A1C (BAYER DCA - WAIVED) 6.7 <7.0 %  Lipid Panel w/o Chol/HDL Ratio  Result Value Ref Range   Cholesterol, Total 164 100 - 199 mg/dL   Triglycerides 172 (H) 0 - 149 mg/dL  HDL 57 >39 mg/dL   VLDL Cholesterol Cal 29 5 - 40 mg/dL   LDL Chol Calc (NIH) 78 0 - 99 mg/dL  TSH  Result Value Ref Range   TSH 3.260 0.450 - 4.500 uIU/mL  Microalbumin, Urine Waived  Result Value Ref Range   Microalb, Ur Waived 10 0 - 19 mg/L   Creatinine, Urine Waived 50 10 - 300 mg/dL   Microalb/Creat Ratio 30-300 (H) <30 mg/g  Basic metabolic panel  Result Value Ref Range   Glucose 115 (H) 65 - 99 mg/dL   BUN 17 8 - 27 mg/dL   Creatinine, Ser 1.27 0.76 - 1.27 mg/dL   GFR calc non Af Amer 54 (L) >59 mL/min/1.73   GFR calc Af Amer 62 >59 mL/min/1.73   BUN/Creatinine Ratio 13 10 - 24   Sodium 140 134 - 144 mmol/L   Potassium 3.5 3.5 - 5.2 mmol/L   Chloride 100 96 - 106 mmol/L   CO2 26 20 - 29 mmol/L   Calcium 10.7 (H) 8.6 - 10.2 mg/dL        Current Outpatient Medications:  .  amLODipine (NORVASC) 10 MG  tablet, TAKE 1 TABLET EVERY DAY, Disp: 90 tablet, Rfl: 0 .  aspirin EC 81 MG tablet, Take 81 mg by mouth daily., Disp: , Rfl:  .  benazepril-hydrochlorthiazide (LOTENSIN HCT) 20-12.5 MG tablet, TAKE 1 TABLET EVERY DAY, Disp: 90 tablet, Rfl: 0 .  carvedilol (COREG) 25 MG tablet, TAKE 2 TABLETS TWICE DAILY, Disp: 360 tablet, Rfl: 0 .  cetirizine (ZYRTEC) 10 MG tablet, Take 10 mg by mouth as needed. , Disp: , Rfl:  .  cloNIDine (CATAPRES) 0.3 MG tablet, TAKE 1 TABLET TWICE DAILY, Disp: 180 tablet, Rfl: 0 .  docusate sodium (COLACE) 100 MG capsule, Take 100 mg by mouth every other day. , Disp: , Rfl:  .  doxazosin (CARDURA) 2 MG tablet, TAKE 1 TABLET EVERY MORNING, Disp: 90 tablet, Rfl: 0 .  furosemide (LASIX) 40 MG tablet, TAKE 1 TABLET EVERY DAY, Disp: 90 tablet, Rfl: 0 .  glipiZIDE (GLUCOTROL) 5 MG tablet, TAKE 1 TABLET DAILY BEFORE BREAKFAST, Disp: 90 tablet, Rfl: 0 .  metFORMIN (GLUCOPHAGE) 500 MG tablet, TAKE 1 TABLET TWICE DAILY, Disp: 180 tablet, Rfl: 0 .  omeprazole (PRILOSEC OTC) 20 MG tablet, Take 20 mg by mouth as needed. , Disp: , Rfl:  .  potassium chloride SA (KLOR-CON) 20 MEQ tablet, TAKE 1 TABLET EVERY DAY, Disp: 90 tablet, Rfl: 0 .  pravastatin (PRAVACHOL) 40 MG tablet, TAKE 1 TABLET AT BEDTIME, Disp: 90 tablet, Rfl: 0 .  senna (SENOKOT) 8.6 MG TABS tablet, Take 2 tablets (17.2 mg total) by mouth at bedtime as needed for mild constipation., Disp: 120 each, Rfl: 0 .  triamcinolone (KENALOG) 0.1 %, Apply 1 application topically 2 (two) times daily., Disp: 30 g, Rfl: 0 .  empagliflozin (JARDIANCE) 25 MG TABS tablet, Take 1 tablet (25 mg total) by mouth daily before breakfast., Disp: 90 tablet, Rfl: 1 .  traMADol (ULTRAM) 50 MG tablet, Take 1 tablet (50 mg total) by mouth every 6 (six) hours as needed for moderate pain or severe pain. (Patient not taking: Reported on 11/30/2020), Disp: 20 tablet, Rfl: 0    Assessment & Plan:  1. Arthritis Fu with ortho IMPRESSION: Radial tear root of  the posterior horn of the lateral meniscus. The body of the lateral meniscus is extruded peripherally. Mri of knee :  Degenerated posterior horn of the medial meniscus without  meniscal tear.  2. DM will stop glipizide a1c 6.7 continue metformin and jardiance.  check HbA1c,  urine  microalbumin  diabetic diet plan given to pt  adviced regarding hypoglycemia and instructions given to pt today on how to prevent and treat the same if it were to occur. pt acknowledges the plan and voices understanding of the same.  exercise plan given and encouraged.   advice diabetic yearly podiatry, ophthalmology , nutritionist , dental check q 6 months,   3. HTN:  is on noravsc coreg benzaprl / hctz, lasix  Had a cabg x 81    Problem List Items Addressed This Visit   None      Follow up plan: No follow-ups on file.

## 2020-12-01 LAB — COMPREHENSIVE METABOLIC PANEL
ALT: 10 IU/L (ref 0–44)
AST: 17 IU/L (ref 0–40)
Albumin/Globulin Ratio: 1.9 (ref 1.2–2.2)
Albumin: 4.4 g/dL (ref 3.7–4.7)
Alkaline Phosphatase: 87 IU/L (ref 44–121)
BUN/Creatinine Ratio: 14 (ref 10–24)
BUN: 15 mg/dL (ref 8–27)
Bilirubin Total: 0.4 mg/dL (ref 0.0–1.2)
CO2: 24 mmol/L (ref 20–29)
Calcium: 10.5 mg/dL — ABNORMAL HIGH (ref 8.6–10.2)
Chloride: 97 mmol/L (ref 96–106)
Creatinine, Ser: 1.11 mg/dL (ref 0.76–1.27)
Globulin, Total: 2.3 g/dL (ref 1.5–4.5)
Glucose: 195 mg/dL — ABNORMAL HIGH (ref 65–99)
Potassium: 3.6 mmol/L (ref 3.5–5.2)
Sodium: 139 mmol/L (ref 134–144)
Total Protein: 6.7 g/dL (ref 6.0–8.5)
eGFR: 68 mL/min/{1.73_m2} (ref >59)

## 2020-12-05 DIAGNOSIS — M1712 Unilateral primary osteoarthritis, left knee: Secondary | ICD-10-CM | POA: Diagnosis not present

## 2020-12-05 DIAGNOSIS — M7062 Trochanteric bursitis, left hip: Secondary | ICD-10-CM | POA: Diagnosis not present

## 2020-12-12 ENCOUNTER — Other Ambulatory Visit: Payer: Self-pay

## 2020-12-12 ENCOUNTER — Other Ambulatory Visit: Payer: Medicare HMO

## 2020-12-12 DIAGNOSIS — E1169 Type 2 diabetes mellitus with other specified complication: Secondary | ICD-10-CM

## 2020-12-12 DIAGNOSIS — E785 Hyperlipidemia, unspecified: Secondary | ICD-10-CM | POA: Diagnosis not present

## 2020-12-12 DIAGNOSIS — E1122 Type 2 diabetes mellitus with diabetic chronic kidney disease: Secondary | ICD-10-CM

## 2020-12-12 DIAGNOSIS — E1159 Type 2 diabetes mellitus with other circulatory complications: Secondary | ICD-10-CM | POA: Diagnosis not present

## 2020-12-12 DIAGNOSIS — N400 Enlarged prostate without lower urinary tract symptoms: Secondary | ICD-10-CM

## 2020-12-12 DIAGNOSIS — N183 Chronic kidney disease, stage 3 unspecified: Secondary | ICD-10-CM | POA: Diagnosis not present

## 2020-12-12 DIAGNOSIS — I152 Hypertension secondary to endocrine disorders: Secondary | ICD-10-CM

## 2020-12-12 DIAGNOSIS — E119 Type 2 diabetes mellitus without complications: Secondary | ICD-10-CM

## 2020-12-12 LAB — MICROALBUMIN, URINE WAIVED
Creatinine, Urine Waived: 50 mg/dL (ref 10–300)
Microalb, Ur Waived: 10 mg/L (ref 0–19)
Microalb/Creat Ratio: <30 mg/g (ref <30)

## 2020-12-13 LAB — CBC WITH DIFFERENTIAL/PLATELET
Basophils Absolute: 0.1 10*3/uL (ref 0.0–0.2)
Basos: 1 %
EOS (ABSOLUTE): 0.3 10*3/uL (ref 0.0–0.4)
Eos: 5 %
Hematocrit: 44.7 % (ref 37.5–51.0)
Hemoglobin: 15 g/dL (ref 13.0–17.7)
Immature Grans (Abs): 0 10*3/uL (ref 0.0–0.1)
Immature Granulocytes: 0 %
Lymphocytes Absolute: 1.1 10*3/uL (ref 0.7–3.1)
Lymphs: 17 %
MCH: 30.6 pg (ref 26.6–33.0)
MCHC: 33.6 g/dL (ref 31.5–35.7)
MCV: 91 fL (ref 79–97)
Monocytes Absolute: 0.4 10*3/uL (ref 0.1–0.9)
Monocytes: 6 %
Neutrophils Absolute: 4.8 10*3/uL (ref 1.4–7.0)
Neutrophils: 71 %
Platelets: 193 10*3/uL (ref 150–450)
RBC: 4.9 x10E6/uL (ref 4.14–5.80)
RDW: 12.9 % (ref 11.6–15.4)
WBC: 6.8 10*3/uL (ref 3.4–10.8)

## 2020-12-13 LAB — COMPREHENSIVE METABOLIC PANEL
ALT: 11 IU/L (ref 0–44)
AST: 20 IU/L (ref 0–40)
Albumin/Globulin Ratio: 2.1 (ref 1.2–2.2)
Albumin: 4.6 g/dL (ref 3.7–4.7)
Alkaline Phosphatase: 92 IU/L (ref 44–121)
BUN/Creatinine Ratio: 11 (ref 10–24)
BUN: 15 mg/dL (ref 8–27)
Bilirubin Total: 0.5 mg/dL (ref 0.0–1.2)
CO2: 21 mmol/L (ref 20–29)
Calcium: 10.2 mg/dL (ref 8.6–10.2)
Chloride: 99 mmol/L (ref 96–106)
Creatinine, Ser: 1.33 mg/dL — ABNORMAL HIGH (ref 0.76–1.27)
Globulin, Total: 2.2 g/dL (ref 1.5–4.5)
Glucose: 196 mg/dL — ABNORMAL HIGH (ref 65–99)
Potassium: 3.5 mmol/L (ref 3.5–5.2)
Sodium: 141 mmol/L (ref 134–144)
Total Protein: 6.8 g/dL (ref 6.0–8.5)
eGFR: 54 mL/min/{1.73_m2} — ABNORMAL LOW (ref >59)

## 2020-12-13 LAB — LIPID PANEL
Chol/HDL Ratio: 2.7 ratio (ref 0.0–5.0)
Cholesterol, Total: 142 mg/dL (ref 100–199)
HDL: 53 mg/dL (ref >39)
LDL Chol Calc (NIH): 64 mg/dL (ref 0–99)
Triglycerides: 145 mg/dL (ref 0–149)
VLDL Cholesterol Cal: 25 mg/dL (ref 5–40)

## 2020-12-13 LAB — HEMOGLOBIN A1C
Est. average glucose Bld gHb Est-mCnc: 183 mg/dL
Hgb A1c MFr Bld: 8 % — ABNORMAL HIGH (ref 4.8–5.6)

## 2020-12-13 LAB — PSA: Prostate Specific Ag, Serum: 1.3 ng/mL (ref 0.0–4.0)

## 2020-12-13 LAB — TSH: TSH: 3.22 u[IU]/mL (ref 0.450–4.500)

## 2020-12-19 ENCOUNTER — Ambulatory Visit (INDEPENDENT_AMBULATORY_CARE_PROVIDER_SITE_OTHER): Payer: Medicare HMO | Admitting: Internal Medicine

## 2020-12-19 ENCOUNTER — Other Ambulatory Visit: Payer: Self-pay

## 2020-12-19 ENCOUNTER — Encounter: Payer: Self-pay | Admitting: Internal Medicine

## 2020-12-19 VITALS — BP 140/77 | HR 74 | Temp 98.0°F | Ht 69.88 in | Wt 181.2 lb

## 2020-12-19 DIAGNOSIS — E119 Type 2 diabetes mellitus without complications: Secondary | ICD-10-CM

## 2020-12-19 DIAGNOSIS — E1122 Type 2 diabetes mellitus with diabetic chronic kidney disease: Secondary | ICD-10-CM | POA: Diagnosis not present

## 2020-12-19 DIAGNOSIS — I25810 Atherosclerosis of coronary artery bypass graft(s) without angina pectoris: Secondary | ICD-10-CM | POA: Diagnosis not present

## 2020-12-19 DIAGNOSIS — M7989 Other specified soft tissue disorders: Secondary | ICD-10-CM

## 2020-12-19 NOTE — Progress Notes (Signed)
BP 140/77   Pulse 74   Temp 98 F (36.7 C) (Oral)   Ht 5' 9.88" (1.775 m)   Wt 181 lb 3.2 oz (82.2 kg)   SpO2 98%   BMI 26.09 kg/m    Subjective:    Patient ID: Ryan Kirby, male    DOB: 1940-11-02, 80 y.o.   MRN: [id number]  HPI: Ryan Kirby is a 80 y.o. male  Hypertension This is a chronic problem. The current episode started more than 1 year ago. Pertinent negatives include no blurred vision or chest pain.  Hyperlipidemia This is a chronic problem. Pertinent negatives include no chest pain.  Coronary Artery Disease Presents for follow-up visit. Pertinent negatives include no chest pain. Risk factors include hyperlipidemia and hypertension.  Gastroesophageal Reflux He reports no abdominal pain, no belching, no chest pain, no early satiety, no globus sensation, no heartburn or no hoarse voice. Pertinent negatives include no fatigue.  Diabetes He presents for his follow-up diabetic visit. He has type 2 diabetes mellitus. Pertinent negatives for diabetes include no blurred vision, no chest pain, no fatigue, no foot paresthesias, no foot ulcerations, no polydipsia, no polyphagia, no polyuria, no visual change and no weakness.    Chief Complaint  Patient presents with  . Hypertension  . Hyperlipidemia  . Coronary Artery Disease  . Chronic Kidney Disease    Stage 3  . Gastroesophageal Reflux  . PVD    Relevant past medical, surgical, family and social history reviewed and updated as indicated. Interim medical history since our last visit reviewed. Allergies and medications reviewed and updated.  Review of Systems  Constitutional: Negative for fatigue.  HENT: Negative for hoarse voice.   Eyes: Negative for blurred vision.  Cardiovascular: Negative for chest pain.  Gastrointestinal: Negative for abdominal pain and heartburn.  Endocrine: Negative for polydipsia, polyphagia and polyuria.  Neurological: Negative for weakness.    Per HPI unless specifically indicated  above     Objective:    BP 140/77   Pulse 74   Temp 98 F (36.7 C) (Oral)   Ht 5' 9.88" (1.775 m)   Wt 181 lb 3.2 oz (82.2 kg)   SpO2 98%   BMI 26.09 kg/m   Wt Readings from Last 3 Encounters:  12/19/20 181 lb 3.2 oz (82.2 kg)  11/30/20 189 lb (85.7 kg)  08/18/20 186 lb 3.2 oz (84.5 kg)    Physical Exam Vitals and nursing note reviewed.  Constitutional:      General: He is not in acute distress.    Appearance: Normal appearance. He is not ill-appearing or diaphoretic.  HENT:     Head: Normocephalic and atraumatic.     Right Ear: Tympanic membrane and external ear normal. There is no impacted cerumen.     Left Ear: External ear normal.     Nose: No congestion or rhinorrhea.     Mouth/Throat:     Pharynx: No oropharyngeal exudate or posterior oropharyngeal erythema.  Eyes:     Conjunctiva/sclera: Conjunctivae normal.     Pupils: Pupils are equal, round, and reactive to light.  Cardiovascular:     Rate and Rhythm: Normal rate and regular rhythm.     Heart sounds: No murmur heard. No friction rub. No gallop.   Pulmonary:     Effort: No respiratory distress.     Breath sounds: No stridor. No wheezing or rhonchi.  Chest:     Chest wall: No tenderness.  Abdominal:     General: Abdomen  is flat. Bowel sounds are normal.     Palpations: Abdomen is soft. There is no mass.     Tenderness: There is no abdominal tenderness.  Musculoskeletal:     Cervical back: Normal range of motion and neck supple. No rigidity or tenderness.     Left lower leg: No edema.  Skin:    General: Skin is warm and dry.  Neurological:     Mental Status: He is alert.     Results for orders placed or performed in visit on 12/12/20  CBC with Differential/Platelet  Result Value Ref Range   WBC 6.8 3.4 - 10.8 x10E3/uL   RBC 4.90 4.14 - 5.80 x10E6/uL   Hemoglobin 15.0 13.0 - 17.7 g/dL   Hematocrit 44.7 37.5 - 51.0 %   MCV 91 79 - 97 fL   MCH 30.6 26.6 - 33.0 pg   MCHC 33.6 31.5 - 35.7 g/dL    RDW 12.9 11.6 - 15.4 %   Platelets 193 150 - 450 x10E3/uL   Neutrophils 71 Not Estab. %   Lymphs 17 Not Estab. %   Monocytes 6 Not Estab. %   Eos 5 Not Estab. %   Basos 1 Not Estab. %   Neutrophils Absolute 4.8 1.4 - 7.0 x10E3/uL   Lymphocytes Absolute 1.1 0.7 - 3.1 x10E3/uL   Monocytes Absolute 0.4 0.1 - 0.9 x10E3/uL   EOS (ABSOLUTE) 0.3 0.0 - 0.4 x10E3/uL   Basophils Absolute 0.1 0.0 - 0.2 x10E3/uL   Immature Granulocytes 0 Not Estab. %   Immature Grans (Abs) 0.0 0.0 - 0.1 x10E3/uL  Comprehensive metabolic panel  Result Value Ref Range   Glucose 196 (H) 65 - 99 mg/dL   BUN 15 8 - 27 mg/dL   Creatinine, Ser 1.33 (H) 0.76 - 1.27 mg/dL   eGFR 54 (L) >59 mL/min/1.73   BUN/Creatinine Ratio 11 10 - 24   Sodium 141 134 - 144 mmol/L   Potassium 3.5 3.5 - 5.2 mmol/L   Chloride 99 96 - 106 mmol/L   CO2 21 20 - 29 mmol/L   Calcium 10.2 8.6 - 10.2 mg/dL   Total Protein 6.8 6.0 - 8.5 g/dL   Albumin 4.6 3.7 - 4.7 g/dL   Globulin, Total 2.2 1.5 - 4.5 g/dL   Albumin/Globulin Ratio 2.1 1.2 - 2.2   Bilirubin Total 0.5 0.0 - 1.2 mg/dL   Alkaline Phosphatase 92 44 - 121 IU/L   AST 20 0 - 40 IU/L   ALT 11 0 - 44 IU/L  Hemoglobin A1c  Result Value Ref Range   Hgb A1c MFr Bld 8.0 (H) 4.8 - 5.6 %   Est. average glucose Bld gHb Est-mCnc 183 mg/dL  Microalbumin, Urine Waived  Result Value Ref Range   Microalb, Ur Waived 10 0 - 19 mg/L   Creatinine, Urine Waived 50 10 - 300 mg/dL   Microalb/Creat Ratio <30 <30 mg/g  Lipid panel  Result Value Ref Range   Cholesterol, Total 142 100 - 199 mg/dL   Triglycerides 145 0 - 149 mg/dL   HDL 53 >39 mg/dL   VLDL Cholesterol Cal 25 5 - 40 mg/dL   LDL Chol Calc (NIH) 64 0 - 99 mg/dL   Chol/HDL Ratio 2.7 0.0 - 5.0 ratio  PSA  Result Value Ref Range   Prostate Specific Ag, Serum 1.3 0.0 - 4.0 ng/mL  TSH  Result Value Ref Range   TSH 3.220 0.450 - 4.500 uIU/mL        Current Outpatient  Medications:  .  amLODipine (NORVASC) 10 MG tablet, TAKE 1  TABLET EVERY DAY, Disp: 90 tablet, Rfl: 0 .  aspirin EC 81 MG tablet, Take 81 mg by mouth daily., Disp: , Rfl:  .  benazepril-hydrochlorthiazide (LOTENSIN HCT) 20-12.5 MG tablet, TAKE 1 TABLET EVERY DAY, Disp: 90 tablet, Rfl: 0 .  carvedilol (COREG) 25 MG tablet, TAKE 2 TABLETS TWICE DAILY, Disp: 360 tablet, Rfl: 0 .  cetirizine (ZYRTEC) 10 MG tablet, Take 10 mg by mouth as needed. , Disp: , Rfl:  .  cloNIDine (CATAPRES) 0.3 MG tablet, TAKE 1 TABLET TWICE DAILY, Disp: 180 tablet, Rfl: 0 .  docusate sodium (COLACE) 100 MG capsule, Take 100 mg by mouth every other day. , Disp: , Rfl:  .  doxazosin (CARDURA) 2 MG tablet, TAKE 1 TABLET EVERY MORNING, Disp: 90 tablet, Rfl: 0 .  empagliflozin (JARDIANCE) 25 MG TABS tablet, Take 1 tablet (25 mg total) by mouth daily before breakfast., Disp: 90 tablet, Rfl: 1 .  furosemide (LASIX) 40 MG tablet, TAKE 1 TABLET EVERY DAY, Disp: 90 tablet, Rfl: 0 .  metFORMIN (GLUCOPHAGE) 500 MG tablet, TAKE 1 TABLET TWICE DAILY, Disp: 180 tablet, Rfl: 0 .  omeprazole (PRILOSEC OTC) 20 MG tablet, Take 20 mg by mouth as needed. , Disp: , Rfl:  .  potassium chloride SA (KLOR-CON) 20 MEQ tablet, TAKE 1 TABLET EVERY DAY, Disp: 90 tablet, Rfl: 0 .  pravastatin (PRAVACHOL) 40 MG tablet, TAKE 1 TABLET AT BEDTIME, Disp: 90 tablet, Rfl: 0 .  senna (SENOKOT) 8.6 MG TABS tablet, Take 2 tablets (17.2 mg total) by mouth at bedtime as needed for mild constipation., Disp: 120 each, Rfl: 0 .  traMADol (ULTRAM) 50 MG tablet, Take 1 tablet (50 mg total) by mouth every 6 (six) hours as needed for moderate pain or severe pain., Disp: 20 tablet, Rfl: 0 .  triamcinolone (KENALOG) 0.1 %, Apply 1 application topically 2 (two) times daily., Disp: 30 g, Rfl: 0    Assessment & Plan:  1 DM:  a1c higher.    Ref. Range 12/12/2020 08:15  Glucose Latest Ref Range: 65 - 99 mg/dL 196 (H)  Hemoglobin A1C Latest Ref Range: 4.8 - 5.6 % 8.0 (H)  Is on jardiacne and metformin   check HbA1c,  urine   microalbumin  diabetic diet plan given to pt  adviced regarding hypoglycemia and instructions given to pt today on how to prevent and treat the same if it were to occur. pt acknowledges the plan and voices understanding of the same.  exercise plan given and encouraged.   advice diabetic yearly podiatry, ophthalmology , nutritionist , dental check q 6 months,   2. CKD : creatnine - high ijncrease water intake   Ref. Range 11/30/2020 11:57 12/12/2020 08:14 12/12/2020 08:15  BUN Latest Ref Range: 8 - 27 mg/dL 15  15  Creatinine Latest Ref Range: 0.76 - 1.27 mg/dL 1.11  1.33 (H)     3. htn / CAD s/p CABG  Some SOB ? Needs ECHO has ankle swelling at night Will refer to cards asap  Lost to fu    4. Arthritis : Fu with ortho IMPRESSION: Radial tear root of the posterior horn of the lateral meniscus. The body of the lateral meniscus is extruded peripherally. Mri of knee :  Degenerated posterior horn of the medial meniscus without meniscal tea  5. HTN:  is on noravsc coreg benzaprl / hctz, lasix  Had a cabg x 81  Problem List Items  Addressed This Visit   None      Follow up plan: No follow-ups on file.

## 2020-12-19 NOTE — Patient Instructions (Signed)
Diabetes Mellitus Action Plan Following a diabetes action plan is a way for you to manage your diabetes (diabetes mellitus) symptoms. The plan is color-coded to help you understand what actions you need to take based on any symptoms you are having.  If you have symptoms in the red zone, you need medical care right away.  If you have symptoms in the yellow zone, you are having problems.  If you have symptoms in the green zone, you are doing well. Learning about and understanding diabetes can take time. Follow the plan that you develop with your health care provider. Know the target range for your blood sugar (glucose) level, and review your treatment plan with your health care provider at each visit. The target range for my blood sugar level is __________________________ mg/dL. Red zone Get medical help right away if you have any of the following symptoms:  A blood sugar test result that is below 54 mg/dL (3 mmol/L).  A blood sugar test result that is at or above 240 mg/dL (13.3 mmol/L) for 2 days in a row.  Confusion or trouble thinking clearly.  Difficulty breathing.  Sickness or a fever for 2 or more days that is not getting better.  Moderate or large ketone levels in your urine.  Feeling tired or having no energy. If you have any red zone symptoms, do not wait to see if the symptoms will go away. Get medical help right away. Call your local emergency services (911 in the [place] Do not drive yourself to the hospital. If you have severely low blood sugar (severe hypoglycemia) and you cannot eat or drink, you may need glucagon. Make sure a family member or close friend knows how to check your blood sugar and how to give you glucagon. You may need to be treated in a hospital for this condition.   Yellow zone If you have any of the following symptoms, your diabetes is not under control and you may need to make some changes:  A blood sugar test result that is at or above 240 mg/dL (13.3  mmol/L) for 2 days in a row.  Blood sugar test results that are below 70 mg/dL (3.9 mmol/L).  Other symptoms of hypoglycemia, such as: ? Shaking or feeling light-headed. ? Confusion or irritability. ? Feeling hungry. ? Having a fast heartbeat. If you have any yellow zone symptoms:  Treat your hypoglycemia by eating or drinking 15 grams of a rapid-acting carbohydrate. Follow the 15:15 rule: ? Take 15 grams of a rapid-acting carbohydrate, such as:  1 tube of glucose gel.  4 glucose pills.  4 oz (120 mL) of fruit juice.  4 oz (120 mL) of regular (not diet) soda. ? Check your blood sugar 15 minutes after you take the carbohydrate. ? If the repeat blood sugar test is still at or below 70 mg/dL (3.9 mmol/L), take 15 grams of a carbohydrate again. ? If your blood sugar does not increase above 70 mg/dL (3.9 mmol/L) after 3 tries, get medical help right away. ? After your blood sugar returns to normal, eat a meal or a snack within 1 hour.  Keep taking your daily medicines as told by your health care provider.  Check your blood sugar more often than you normally would. ? Write down your results. ? Call your health care provider if you have trouble keeping your blood sugar in your target range.   Green zone These signs mean you are doing well and you can continue what you  are doing to manage your diabetes:  Your blood sugar is within your personal target range. For most people, a blood sugar level before a meal (preprandial) should be 80-130 mg/dL (4.4-7.2 mmol/L).  You feel well, and you are able to do daily activities. If you are in the green zone, continue to manage your diabetes as told by your health care provider. To do this:  Eat a healthy diet.  Exercise regularly.  Check your blood sugar as told by your health care provider.  Take your medicines as told by your health care provider.   Where to find more information  American Diabetes Association (ADA):  diabetes.org  Association of Diabetes Care & Education Specialists (ADCES): diabeteseducator.org Summary  Following a diabetes action plan is a way for you to manage your diabetes symptoms. The plan is color-coded to help you understand what actions you need to take based on any symptoms you are having.  Follow the plan that you develop with your health care provider. Make sure you know your personal target blood sugar level.  Review your treatment plan with your health care provider at each visit. This information is not intended to replace advice given to you by your health care provider. Make sure you discuss any questions you have with your health care provider. Document Revised: 02/25/2020 Document Reviewed: 02/25/2020 Elsevier Patient Education  2021 [place]. Acute Kidney Injury, Adult  Acute kidney injury is a sudden worsening of kidney function. The kidneys are organs that have several jobs. They filter the blood to remove waste products and extra fluid. They also maintain a healthy balance of minerals and hormones in the body, which helps control blood pressure and keep bones strong. With this condition, your kidneys do not do their jobs as well as they should. This condition ranges from mild to severe. Over time, it may develop into long-lasting (chronic) kidney disease. Early detection and treatment may prevent acute kidney injury from developing into a chronic condition. What are the causes? Common causes of this condition include:  A problem with blood flow to the kidneys. This may be caused by: ? Low blood pressure (hypotension) or shock. ? Blood loss. ? Heart and blood vessel (cardiovascular) disease. ? Severe burns. ? Liver disease.  Direct damage to the kidneys. This may be caused by: ? Certain medicines. ? A kidney infection. ? Poisoning. ? Being around or in contact with toxic substances. ? A surgical wound. ? A hard, direct hit to the kidney area.  A  sudden blockage of urine flow. This may be caused by: ? Cancer. ? Kidney stones. ? An enlarged prostate in males. What increases the risk? You are more likely to develop this condition if you:  Are older than age 107.  Are male.  Are hospitalized, especially if you are in critical condition.  Have certain conditions, such as: ? Chronic kidney disease. ? Diabetes. ? Coronary artery disease and heart failure. ? Pulmonary disease. ? Chronic liver disease. What are the signs or symptoms? Symptoms of this condition may not be obvious until the condition becomes severe. Symptoms of this condition can include:  Tiredness (lethargy) or difficulty staying awake.  Nausea or vomiting.  Swelling (edema) of the face, legs, ankles, or feet.  Problems with urination, such as: ? Pain in the abdomen, or pain along the side of your stomach (flank). ? Producing little or no urine. ? Passing urine with a weak flow.  Muscle twitches and cramps, especially in the legs.  Confusion or trouble concentrating.  Loss of appetite.  Fever. How is this diagnosed? Your health care provider can diagnose this condition based on your symptoms, medical history, and a physical exam.  You may also have other tests, such as:  Blood tests.  Urine tests.  Imaging tests.  A test in which a sample of tissue is removed from the kidneys to be examined under a microscope (kidney biopsy). How is this treated? Treatment for this condition depends on the cause and how severe the condition is. In mild cases, treatment may not be needed. The kidneys may heal on their own. In more severe cases, treatment will involve:  Treating the cause of the kidney injury. This may involve changing any medicines you are taking or adjusting your dosage.  Fluids. You may need specialized IV fluids to balance your body's needs.  Having a catheter placed to drain urine and prevent blockages.  Preventing problems from  occurring. This may mean avoiding certain medicines or procedures that can cause further injury to the kidneys. In some cases, treatment may also require:  A procedure to remove toxic wastes from the body (dialysis or continuous renal replacement therapy, CRRT).  Surgery. This may be done to repair a torn kidney or to remove the blockage from the urinary system. Follow these instructions at home: Medicines  Take over-the-counter and prescription medicines only as told by your health care provider.  Do not take any new medicines without your health care provider's approval. Many medicines can worsen your kidney damage.  Do not take any vitamin and mineral supplements without your health care provider's approval. Many nutritional supplements can worsen your kidney damage. Lifestyle  If your health care provider prescribed changes to your diet, follow them. You may need to decrease the amount of protein you eat.  Achieve and maintain a healthy weight. If you need help with this, ask your health care provider.  Start or continue an exercise plan. Try to exercise at least 30 minutes a day, 5 days a week.  Do not use any products that contain nicotine or tobacco, such as cigarettes, e-cigarettes, and chewing tobacco. If you need help quitting, ask your health care provider.   General instructions  Keep track of your blood pressure. Report changes in your blood pressure as told by your health care provider.  Stay up to date with your vaccines. Ask your health care provider which vaccines you need.  Keep all follow-up visits as told by your health care provider. This is important.   Where to find more information  American Association of Kidney Patients: BombTimer.gl  National Kidney Foundation: www.kidney.[place]: https://mathis.com/  Life Options Rehabilitation Program: ? www.lifeoptions.org ? www.kidneyschool.org Contact a health care provider if:  Your symptoms get  worse.  You develop new symptoms. Get help right away if:  You develop symptoms of worsening kidney disease, which include: ? Headaches. ? Abnormally dark or light skin. ? Easy bruising. ? Frequent hiccups. ? Chest pain. ? Shortness of breath. ? End of menstruation in women. ? Seizures. ? Confusion or altered mental status. ? Abdominal or back pain. ? Itchiness.  You have a fever.  Your body is producing less urine.  You have pain or bleeding when you urinate. Summary  Acute kidney injury is a sudden worsening of kidney function.  Acute kidney injury can be caused by problems with blood flow to the kidneys, direct damage to the kidneys, and sudden blockage of urine flow.  Symptoms of this condition may not be obvious until it becomes severe. Symptoms may include edema, lethargy, confusion, nausea or vomiting, and problems passing urine.  This condition can be diagnosed with blood tests, urine tests, and imaging tests. Sometimes a kidney biopsy is done to diagnose this condition.  Treatment for this condition often involves treating the underlying cause. It is treated with fluids, medicines, diet changes, dialysis, or surgery. This information is not intended to replace advice given to you by your health care provider. Make sure you discuss any questions you have with your health care provider. Document Revised: 06/30/2019 Document Reviewed: 06/30/2019 Elsevier Patient Education  2021 [employer].

## 2020-12-20 ENCOUNTER — Telehealth: Payer: Self-pay

## 2020-12-20 NOTE — Chronic Care Management (AMB) (Signed)
  Chronic Care Management   Note  12/20/2020 Name: BERNAL LUHMAN MRN: [id number] DOB: May 26, 1941  FRED FRANZEN is a 80 y.o. year old male who is a primary care patient of Vigg, Avanti, MD. ALBIE ARIZPE is currently enrolled in care management services. An additional referral for Pharm D  was placed.   Follow up plan: Unsuccessful telephone outreach attempt made. A HIPAA compliant phone message was left for the patient providing contact information and requesting a return call.  The care management team will reach out to the patient again over the next 3 days.  If patient returns call to provider office, please advise to call [facility]  at [place], [address], [place], [place] [address] Direct Dial: [phone] [email or web address] Website: [email or web address]

## 2020-12-23 NOTE — Chronic Care Management (AMB) (Signed)
  Chronic Care Management   Note  12/23/2020 Name: STANFORD STRAUCH MRN: [id number] DOB: 04-Nov-1940  NIKO PENSON is a 80 y.o. year old male who is a primary care patient of Vigg, Avanti, MD. CALUB TARNOW is currently enrolled in care management services. An additional referral for Pharm D  was placed.   Follow up plan: Telephone appointment with care management team member scheduled for:01/11/2021  Noreene Larsson, [address], [place], [place] [address] Direct Dial: [phone] [email or web address] Website: [email or web address]

## 2021-01-05 ENCOUNTER — Telehealth: Payer: Self-pay | Admitting: *Deleted

## 2021-01-05 DIAGNOSIS — M1712 Unilateral primary osteoarthritis, left knee: Secondary | ICD-10-CM | POA: Diagnosis not present

## 2021-01-05 NOTE — Telephone Encounter (Signed)
PT. CAME IN AND REQUESTED REFILL ON ACCU CHEK GUIDE TEST STRIP. hE IS COMPLETELY OUT.[facility]

## 2021-01-06 NOTE — Telephone Encounter (Signed)
Faxed

## 2021-01-06 NOTE — Telephone Encounter (Signed)
Completed.

## 2021-01-06 NOTE — Telephone Encounter (Signed)
Order written, can you sign since Vigg is off, patient is out.  In your signature folder

## 2021-01-09 ENCOUNTER — Telehealth: Payer: Self-pay | Admitting: Pharmacist

## 2021-01-09 NOTE — Progress Notes (Unsigned)
Chronic Care Management Pharmacy Note  01/09/2021 Name:  Ryan Kirby MRN:  [id number] DOB:  07/10/1941  Subjective: Ryan Kirby is an 80 y.o. year old male who is a primary patient of Vigg, Avanti, MD.  The CCM team was consulted for assistance with disease management and care coordination needs.    Engaged with patient face to face for initial visit in response to provider referral for pharmacy case management and/or care coordination services.   Consent to Services:  The patient was given the following information about Chronic Care Management services today, agreed to services, and gave verbal consent: 1. CCM service includes personalized support from designated clinical staff supervised by the primary care provider, including individualized plan of care and coordination with other care providers 2. 24/7 contact phone numbers for assistance for urgent and routine care needs. 3. Service will only be billed when office clinical staff spend 20 minutes or more in a month to coordinate care. 4. Only one practitioner may furnish and bill the service in a calendar month. 5.The patient may stop CCM services at any time (effective at the end of the month) by phone call to the office staff. 6. The patient will be responsible for cost sharing (co-pay) of up to 20% of the service fee (after annual deductible is met). Patient agreed to services and consent obtained.  Patient Care Team: Charlynne Cousins, MD as PCP - General Guadalupe Maple, MD as PCP - Family Medicine (Family Medicine) Garrel Ridgel, [place] as Consulting Physician (Podiatry) Vladimir Faster, [facility] as Pharmacist (Pharmacist)  Recent office visits: 12/19/20- Vigg (PCP)-blood work for next appt, referral cards for echo 2/2 leg swelling 11/30/20-Vigg (PCP)- blood work, referral to ortho & cards, stop glipizide, A1c 8.0  Recent consult visits: ***  Hospital visits: {Hospital DC Yes/No:25215}  Objective:  Lab Results  Component Value Date    CREATININE 1.33 (H) 12/12/2020   BUN 15 12/12/2020   GFRNONAA 54 (L) 08/18/2020   GFRAA 62 08/18/2020   NA 141 12/12/2020   K 3.5 12/12/2020   CALCIUM 10.2 12/12/2020   CO2 21 12/12/2020   GLUCOSE 196 (H) 12/12/2020    Lab Results  Component Value Date/Time   HGBA1C 8.0 (H) 12/12/2020 08:15 AM   HGBA1C 7.4 (H) 11/30/2020 11:54 AM   HGBA1C 6.7 08/18/2020 08:51 AM   MICROALBUR 10 12/12/2020 08:14 AM   MICROALBUR 10 08/18/2020 09:10 AM    Last diabetic Eye exam:  Lab Results  Component Value Date/Time   HMDIABEYEEXA No Retinopathy 02/10/2020 12:00 AM    Last diabetic Foot exam: No results found for: HMDIABFOOTEX   Lab Results  Component Value Date   CHOL 142 12/12/2020   HDL 53 12/12/2020   LDLCALC 64 12/12/2020   TRIG 145 12/12/2020   CHOLHDL 2.7 12/12/2020    Hepatic Function Latest Ref Rng & Units 12/12/2020 11/30/2020 02/17/2020  Total Protein 6.0 - 8.5 g/dL 6.8 6.7 6.4  Albumin 3.7 - 4.7 g/dL 4.6 4.4 4.2  AST 0 - 40 IU/L '20 17 18  ' ALT 0 - 44 IU/L '11 10 5  ' Alk Phosphatase 44 - 121 IU/L 92 87 81  Total Bilirubin 0.0 - 1.2 mg/dL 0.5 0.4 0.5    Lab Results  Component Value Date/Time   TSH 3.220 12/12/2020 08:15 AM   TSH 3.260 08/18/2020 09:19 AM    CBC Latest Ref Rng & Units 12/12/2020 06/01/2020 05/26/2020  WBC 3.4 - 10.8 x10E3/uL 6.8 - 7.3  Hemoglobin  13.0 - 17.7 g/dL 15.0 13.6 15.4  Hematocrit 37.5 - 51.0 % 44.7 40.0 43.7  Platelets 150 - 450 x10E3/uL 193 - 208    No results found for: VD25OH  Clinical ASCVD: {YES/NO:21197} The 10-year ASCVD risk score Mikey Bussing DC Jr., et al., 2013) is: 59.6%   Values used to calculate the score:     Age: 10 years     Sex: Male     Is Non-Hispanic African American: No     Diabetic: Yes     Tobacco smoker: No     Systolic Blood Pressure: 683 mmHg     Is BP treated: Yes     HDL Cholesterol: 53 mg/dL     Total Cholesterol: 142 mg/dL    Depression screen [facility] 2/9 11/30/2020 04/15/2020 02/17/2020  Decreased Interest 0 0 0   Down, Depressed, Hopeless 0 0 1  PHQ - 2 Score 0 0 1  Altered sleeping - - 0  Tired, decreased energy - - 1  Change in appetite - - 0  Feeling bad or failure about yourself  - - 0  Trouble concentrating - - 0  Moving slowly or fidgety/restless - - 0  Suicidal thoughts - - 0  PHQ-9 Score - - 2  Difficult doing work/chores - - -     ***Other: (CHADS2VASc if Afib, MMRC or CAT for COPD, ACT, DEXA)  Social History   Tobacco Use  Smoking Status Never Smoker  Smokeless Tobacco Never Used   BP Readings from Last 3 Encounters:  12/19/20 140/77  08/18/20 130/64  06/23/20 (!) 151/79   Pulse Readings from Last 3 Encounters:  12/19/20 74  08/18/20 68  06/23/20 82   Wt Readings from Last 3 Encounters:  12/19/20 181 lb 3.2 oz (82.2 kg)  11/30/20 189 lb (85.7 kg)  08/18/20 186 lb 3.2 oz (84.5 kg)   BMI Readings from Last 3 Encounters:  12/19/20 26.09 kg/m  11/30/20 27.06 kg/m  08/18/20 26.66 kg/m    Assessment/Interventions: Review of patient past medical history, allergies, medications, health status, including review of consultants reports, laboratory and other test data, was performed as part of comprehensive evaluation and provision of chronic care management services.   SDOH:  (Social Determinants of Health) assessments and interventions performed: {yes/no:20286}  SDOH Screenings   Alcohol Screen: Not on file  Depression (PHQ2-9): Low Risk   . PHQ-2 Score: 0  Financial Resource Strain: Low Risk   . Difficulty of Paying Living Expenses: Not hard at all  Food Insecurity: No Food Insecurity  . Worried About Charity fundraiser in the Last Year: Never true  . Ran Out of Food in the Last Year: Never true  Housing: Not on file  Physical Activity: Insufficiently Active  . Days of Exercise per Week: 2 days  . Minutes of Exercise per Session: 50 min  Social Connections: Not on file  Stress: No Stress Concern Present  . Feeling of Stress : Not at all  Tobacco Use: Low  Risk   . Smoking Tobacco Use: Never Smoker  . Smokeless Tobacco Use: Never Used  Transportation Needs: No Transportation Needs  . Lack of Transportation (Medical): No  . Lack of Transportation (Non-Medical): No    CCM Care Plan  Allergies  Allergen Reactions  . Oxycontin [Oxycodone] Nausea And Vomiting    Medications Reviewed Today    Reviewed by Jerelene Redden, CMA (Certified Medical Assistant) on 12/19/20 at 1311  Med List Status: <None>  Medication Order Taking?  Sig Documenting Provider Last Dose Status Informant  amLODipine (NORVASC) 10 MG tablet [id number] Yes TAKE 1 TABLET EVERY DAY Cannady, Jolene T, NP Taking Active   aspirin EC 81 MG tablet [id number] Yes Take 81 mg by mouth daily. [provider] Taking Active Self  benazepril-hydrochlorthiazide (LOTENSIN HCT) 20-12.5 MG tablet [id number] Yes TAKE 1 TABLET EVERY DAY Cannady, Jolene T, NP Taking Active   carvedilol (COREG) 25 MG tablet [id number] Yes TAKE 2 TABLETS TWICE DAILY Cannady, Jolene T, NP Taking Active   cetirizine (ZYRTEC) 10 MG tablet [id number] Yes Take 10 mg by mouth as needed.  [provider] Taking Active Self           Med Note Nat Christen   Fri May 20, 2020 10:10 AM)    cloNIDine (CATAPRES) 0.3 MG tablet [id number] Yes TAKE 1 TABLET TWICE DAILY Cannady, Jolene T, NP Taking Active   docusate sodium (COLACE) 100 MG capsule [id number] Yes Take 100 mg by mouth every other day.  [provider] Taking Active Self  doxazosin (CARDURA) 2 MG tablet [id number] Yes TAKE 1 TABLET EVERY MORNING Cannady, Jolene T, NP Taking Active   empagliflozin (JARDIANCE) 25 MG TABS tablet [id number] Yes Take 1 tablet (25 mg total) by mouth daily before breakfast. Vigg, Avanti, MD Taking Active   furosemide (LASIX) 40 MG tablet [id number] Yes TAKE 1 TABLET EVERY DAY Cannady, Jolene T, NP Taking Active   metFORMIN (GLUCOPHAGE) 500 MG tablet [id number] Yes TAKE 1 TABLET TWICE DAILY Jon Billings, NP Taking  Active   omeprazole (PRILOSEC OTC) 20 MG tablet [id number] Yes Take 20 mg by mouth as needed.  [provider] Taking Active Self  potassium chloride SA (KLOR-CON) 20 MEQ tablet [id number] Yes TAKE 1 TABLET EVERY DAY Cannady, Jolene T, NP Taking Active   pravastatin (PRAVACHOL) 40 MG tablet [id number] Yes TAKE 1 TABLET AT BEDTIME Cannady, Jolene T, NP Taking Active   senna (SENOKOT) 8.6 MG TABS tablet [id number] Yes Take 2 tablets (17.2 mg total) by mouth at bedtime as needed for mild constipation. Earleen Newport, MD Taking Active Self  traMADol Veatrice Bourbon) 50 MG tablet [id number] Yes Take 1 tablet (50 mg total) by mouth every 6 (six) hours as needed for moderate pain or severe pain. Fredirick Maudlin, MD Taking Active   triamcinolone (KENALOG) 0.1 % [id number] Yes Apply 1 application topically 2 (two) times daily. Venita Lick, NP Taking Active           Patient Active Problem List   Diagnosis Date Noted  . Hypokalemia 08/18/2020  . PVD (peripheral vascular disease) ([place]) 08/18/2020  . Status post inguinal hernia repair using synthetic patch 06/23/2020  . Unilateral recurrent inguinal hernia without obstruction or gangrene 05/12/2020  . Hip pain 09/22/2018  . CKD stage 3 secondary to diabetes ([place]) 07/02/2018  . Advanced care planning/counseling discussion 12/24/2017  . GERD (gastroesophageal reflux disease) 12/06/2016  . BPH (benign prostatic hyperplasia) 12/06/2016  . Hyperlipidemia associated with type 2 diabetes mellitus ([place])   . Hypertension associated with diabetes ([place])   . CAD (coronary artery disease)   . Heart murmur 03/04/2014  . 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  . Intermittent alternating exotropia 12/17/2012  . Preglaucoma 12/17/2012    Immunization History  Administered Date(s) Administered  . Fluad Quad(high Dose 65+) 07/27/2019, 08/18/2020  . Influenza, High Dose Seasonal PF 07/17/2016, 06/24/2017, 07/02/2018  . Influenza,inj,Quad PF,6+ Mos  06/02/2015  . PFIZER(Purple Top)SARS-COV-2 Vaccination 10/13/2019, 11/03/2019  . Pneumococcal Conjugate-13 12/01/2015  .  Pneumococcal-Unspecified 07/01/2007  . Td 05/25/2008, 07/02/2018    Conditions to be addressed/monitored:  {USCCMDZASSESSMENTOPTIONS:23563}  There are no care plans that you recently modified to display for this patient.    Medication Assistance: {MEDASSISTANCEINFO:25044}  Patient's preferred pharmacy is:  [facility], [address] [place] [address] Phone: [phone] Fax: [phone]  [facility] [place], [address] [place] [address] Phone: [phone] Fax: [phone]  Uses pill box? {Yes or If no, why not?:20788} Pt endorses ***% compliance  We discussed: {Pharmacy options:24294} Patient decided to: {US Pharmacy Plan:23885}  Care Plan and Follow Up Patient Decision:  {FOLLOWUP:24991}  Plan: {CM FOLLOW UP ZDGU:44034}  ***  Current Barriers:  . {pharmacybarriers:24917}  Pharmacist Clinical Goal(s):  Marland Kitchen Patient will {PHARMACYGOALCHOICES:24921} through collaboration with PharmD and provider.   Interventions: . 1:1 collaboration with Charlynne Cousins, MD regarding development and update of comprehensive plan of care as evidenced by provider attestation and co-signature . Inter-disciplinary care team collaboration (see longitudinal plan of care) . Comprehensive medication review performed; medication list updated in electronic medical record BP Readings from Last 3 Encounters:  12/19/20 140/77  08/18/20 130/64  06/23/20 (!) 151/79    Hypertension (BP goal {CHL HP UPSTREAM Pharmacist BP ranges:[phone]}) -{US controlled/uncontrolled:25276} -Current treatment: . *** -Medications previously tried: ***  -Current home readings: *** -Current dietary habits: *** -Current exercise habits: *** -{ACTIONS;DENIES/REPORTS:21021675::"Denies"}  hypotensive/hypertensive symptoms -Educated on {CCM BP Counseling:25124} -Counseled to monitor BP at home ***, document, and provide log at future appointments -{CCMPHARMDINTERVENTION:25122}  Diabetes (A1c goal {A1c goals:23924}) -{US controlled/uncontrolled:25276} -Current medications: . *** -Medications previously tried: ***  -Current home glucose readings . fasting glucose: *** . post prandial glucose: *** -{ACTIONS;DENIES/REPORTS:21021675::"Denies"} hypoglycemic/hyperglycemic symptoms -Current meal patterns:  . breakfast: ***  . lunch: ***  . dinner: *** . snacks: *** . drinks: *** -Current exercise: *** -Educated on {CCM DM COUNSELING:25123} -Counseled to check feet daily and get yearly eye exams -{CCMPHARMDINTERVENTION:25122}    Hyperlipidemia: (LDL goal < ***) -{US controlled/uncontrolled:25276} -Current treatment: . *** -Medications previously tried: ***  -Current dietary patterns: *** -Current exercise habits: *** -Educated on {CCM HLD Counseling:25126} -{CCMPHARMDINTERVENTION:25122}  *** (Goal: ***) -{US controlled/uncontrolled:25276} -Current treatment  . *** -Medications previously tried: ***  -{CCMPHARMDINTERVENTION:25122}  Health Maintenance -Vaccine gaps: *** -Current therapy:  . *** -Educated on {ccm supplement counseling:25128} -{CCM Patient satisfied:25129} -{CCMPHARMDINTERVENTION:25122}   Patient Goals/Self-Care Activities . Patient will:  - {pharmacypatientgoals:24919}  Follow Up Plan: {CM FOLLOW UP VQQV:95638}

## 2021-01-09 NOTE — Chronic Care Management (AMB) (Signed)
Chronic Care Management Pharmacy Assistant   Name: Ryan Kirby  MRN: [id number] DOB: 12/18/1940  Ryan Kirby is an 80 y.o. year old male who presents for his initial CCM visit with the clinical pharmacist.  Reason for Encounter: Initial CCM Visit    Recent office visits:  12/19/20-Avanti Neomia Dear, MD (PCP)-Follow up,Referral to Cardiology for ECHO 11/30/20- Avanti Vigg, MD (PCP)-Follow Up, Discontinue Glipizide, Return in 2 weeks 08/18/20- Marnee Guarneri, NP(PCP)-Follow up, Influenza vaccine given, Start Triamcinolone cream for dermatitis  Recent consult visits:  None noted  Hospital visits:  None in previous 6 months  Medications: Outpatient Encounter Medications as of 01/09/2021  Medication Sig  . amLODipine (NORVASC) 10 MG tablet TAKE 1 TABLET EVERY DAY  . aspirin EC 81 MG tablet Take 81 mg by mouth daily.  . benazepril-hydrochlorthiazide (LOTENSIN HCT) 20-12.5 MG tablet TAKE 1 TABLET EVERY DAY  . carvedilol (COREG) 25 MG tablet TAKE 2 TABLETS TWICE DAILY  . cetirizine (ZYRTEC) 10 MG tablet Take 10 mg by mouth as needed.   . cloNIDine (CATAPRES) 0.3 MG tablet TAKE 1 TABLET TWICE DAILY  . docusate sodium (COLACE) 100 MG capsule Take 100 mg by mouth every other day.   . doxazosin (CARDURA) 2 MG tablet TAKE 1 TABLET EVERY MORNING  . empagliflozin (JARDIANCE) 25 MG TABS tablet Take 1 tablet (25 mg total) by mouth daily before breakfast.  . furosemide (LASIX) 40 MG tablet TAKE 1 TABLET EVERY DAY  . metFORMIN (GLUCOPHAGE) 500 MG tablet TAKE 1 TABLET TWICE DAILY  . omeprazole (PRILOSEC OTC) 20 MG tablet Take 20 mg by mouth as needed.   . potassium chloride SA (KLOR-CON) 20 MEQ tablet TAKE 1 TABLET EVERY DAY  . pravastatin (PRAVACHOL) 40 MG tablet TAKE 1 TABLET AT BEDTIME  . senna (SENOKOT) 8.6 MG TABS tablet Take 2 tablets (17.2 mg total) by mouth at bedtime as needed for mild constipation.  . traMADol (ULTRAM) 50 MG tablet Take 1 tablet (50 mg total) by mouth every 6 (six)  hours as needed for moderate pain or severe pain.  Marland Kitchen triamcinolone (KENALOG) 0.1 % Apply 1 application topically 2 (two) times daily.   No facility-administered encounter medications on file as of 01/09/2021.   Amlodipine 10 mg last filled 10/08/20 90 DS Aspirin 81 mg Benazepril- hydrochlorothiazide 20-12.5 mg last filled 10/08/20 90 DS Carvedilol 25 mg last filled 10/08/20 90 DS Zyrtec 10 mg clonidine 0.3 mg last filled 10/08/20 90 DS Colace 100 mg last filled 02/10/20 Doxazosin 2 mg last filled 10/08/20 90 DS Jardiance 25 mg last filled 02/10/20 Furosemide 40 mg last filled 10/08/20 90 DS Metformin 500 mg last filled 10/08/20 90 DS Omeprazole 20 mg Potassium chloride 20 meq last filled 10/08/20 90 DS Pravastatin 40 mg last filled 10/08/20 90 DS    Have you seen any other providers since your last visit?  [facility], patient received an injection in left knee on 01/05/21  Any changes in your medications or health?  Patient states there have been no changes in his medications or health.  Any side effects from any medications?  Patient states he is not having any side effects.  Do you have an symptoms or problems not managed by your medications? "Achy Bones"  Any concerns about your health right now?  Patient states he does not have any concerns at this time.  Has your provider asked that you check blood pressure, blood sugar, or follow special diet at home?  Patient states he checks  blood sugar but has not had any test strips. He states he went to the pharmacy Friday at 5 pm and they did not have his prescription for test strips.  Do you get any type of exercise on a regular basis?   Patient states he walks about 3-4 times per week.  Can you think of a goal you would like to reach for your health? Patient would like to be able to get out and do what he needs to do without having aches and pains.  Do you have any problems getting your medications?  Patient states he dies  not   Is there anything that you would like to discuss during the appointment?  Patient states there is nothing he can think of at this time.  Please bring medications and supplements to appointment     Star Rating Drugs: Pravastatin 40 mg last filled 10/08/20 90 DS Jardiance 25 mg last filled 02/10/20 Metformin 500 mg last filled 10/08/20 90 DS Benazepril- hydrochlorothiazide 20-12.5 mg last filled 10/08/20 90 DS  [facility] Pharmacist Assistant [phone]

## 2021-01-11 ENCOUNTER — Ambulatory Visit: Payer: Medicare HMO | Admitting: Pharmacist

## 2021-01-17 ENCOUNTER — Telehealth: Payer: Self-pay | Admitting: Internal Medicine

## 2021-01-17 NOTE — Telephone Encounter (Signed)
Ryan Kirby, from Ryan Kirby, calling stating that they are needing to have alcohol swabs for the pts glucometer. He states that they sent over a fax on 01/11/21. Please advise.        [facility], [place]  [place] [address]  Phone: [phone] Fax: [phone]  Hours: Not open 24 hours

## 2021-01-19 DIAGNOSIS — E119 Type 2 diabetes mellitus without complications: Secondary | ICD-10-CM | POA: Diagnosis not present

## 2021-01-19 DIAGNOSIS — R011 Cardiac murmur, unspecified: Secondary | ICD-10-CM | POA: Diagnosis not present

## 2021-01-19 DIAGNOSIS — I10 Essential (primary) hypertension: Secondary | ICD-10-CM | POA: Diagnosis not present

## 2021-01-19 DIAGNOSIS — I2510 Atherosclerotic heart disease of native coronary artery without angina pectoris: Secondary | ICD-10-CM | POA: Diagnosis not present

## 2021-01-24 ENCOUNTER — Other Ambulatory Visit: Payer: Medicare HMO

## 2021-01-24 ENCOUNTER — Other Ambulatory Visit: Payer: Self-pay

## 2021-01-24 DIAGNOSIS — E119 Type 2 diabetes mellitus without complications: Secondary | ICD-10-CM | POA: Diagnosis not present

## 2021-01-24 DIAGNOSIS — I25810 Atherosclerosis of coronary artery bypass graft(s) without angina pectoris: Secondary | ICD-10-CM

## 2021-01-24 LAB — BAYER DCA HB A1C WAIVED: HB A1C (BAYER DCA - WAIVED): 8.7 % — ABNORMAL HIGH (ref <7.0)

## 2021-01-24 MED ORDER — CURITY ALCOHOL SWABS PADS: 1.0000 "application " | MEDICATED_PAD | 3 refills | Status: AC

## 2021-01-25 LAB — COMPREHENSIVE METABOLIC PANEL
ALT: 12 IU/L (ref 0–44)
AST: 15 IU/L (ref 0–40)
Albumin/Globulin Ratio: 2 (ref 1.2–2.2)
Albumin: 4.3 g/dL (ref 3.7–4.7)
Alkaline Phosphatase: 87 IU/L (ref 44–121)
BUN/Creatinine Ratio: 12 (ref 10–24)
BUN: 16 mg/dL (ref 8–27)
Bilirubin Total: 0.5 mg/dL (ref 0.0–1.2)
CO2: 24 mmol/L (ref 20–29)
Calcium: 10.3 mg/dL — ABNORMAL HIGH (ref 8.6–10.2)
Chloride: 102 mmol/L (ref 96–106)
Creatinine, Ser: 1.31 mg/dL — ABNORMAL HIGH (ref 0.76–1.27)
Globulin, Total: 2.2 g/dL (ref 1.5–4.5)
Glucose: 173 mg/dL — ABNORMAL HIGH (ref 65–99)
Potassium: 3.2 mmol/L — ABNORMAL LOW (ref 3.5–5.2)
Sodium: 144 mmol/L (ref 134–144)
Total Protein: 6.5 g/dL (ref 6.0–8.5)
eGFR: 55 mL/min/{1.73_m2} — ABNORMAL LOW (ref >59)

## 2021-01-31 ENCOUNTER — Ambulatory Visit (INDEPENDENT_AMBULATORY_CARE_PROVIDER_SITE_OTHER): Payer: Medicare HMO | Admitting: Nurse Practitioner

## 2021-01-31 ENCOUNTER — Other Ambulatory Visit: Payer: Self-pay

## 2021-01-31 ENCOUNTER — Encounter: Payer: Self-pay | Admitting: Nurse Practitioner

## 2021-01-31 VITALS — BP 129/71 | HR 79 | Temp 99.4°F | Ht 69.88 in | Wt 179.6 lb

## 2021-01-31 DIAGNOSIS — E876 Hypokalemia: Secondary | ICD-10-CM

## 2021-01-31 DIAGNOSIS — E119 Type 2 diabetes mellitus without complications: Secondary | ICD-10-CM | POA: Diagnosis not present

## 2021-01-31 DIAGNOSIS — R600 Localized edema: Secondary | ICD-10-CM

## 2021-01-31 NOTE — Progress Notes (Signed)
Established Patient Office Visit  Subjective:  Patient ID: Ryan Kirby, male    DOB: March 07, 1941  Age: 80 y.o. MRN: [id number]  CC:  Chief Complaint  Patient presents with  . Leg Swelling    HPI Ryan Kirby presents for follow-up on leg swelling   LEG SWELLING  Bilateral leg edema was noted at visit in April 2022. Ryan Kirby saw cardiology a couple weeks ago who made no recommendations for changes. Ryan Kirby states that his leg swelling has not gotten worse or better. Ryan Kirby is trying to watch the amount of salt that Ryan Kirby is eating. Ryan Kirby denies shortness of breath and chest pain.  Past Medical History:  Diagnosis Date  . Allergy   . Arthritis   . CAD (coronary artery disease)   . Chronic kidney disease    pt is not on dialysis  . Diabetes mellitus without complication ([place])   . GERD (gastroesophageal reflux disease)   . Hyperlipidemia   . Hypertension     Past Surgical History:  Procedure Laterality Date  . BOWEL RESECTION  2004  . CORONARY ARTERY BYPASS GRAFT    . FINGER AMPUTATION Left 2011   5th  . INGUINAL HERNIA REPAIR Right 06/01/2020   Procedure: HERNIA REPAIR INGUINAL ADULT, open;  Surgeon: Fredirick Maudlin, MD;  Location: [facility] ORS;  Service: General;  Laterality: Right;  . KNEE ARTHROSCOPY Right 09/12/2015   Procedure: ARTHROSCOPY KNEE, LATERAL MENISECTOMY, CHONDROPLASTY;  Surgeon: Dereck Leep, MD;  Location: [facility] OR[facility];  Service: Orthopedics;  Laterality: Right;  . WISDOM TOOTH EXTRACTION      Family History  Problem Relation Age of Onset  . Arthritis Mother   . Hypertension Mother   . Breast cancer Mother   . Arthritis Father   . Hypertension Father   . Colon cancer Father     Social History   Socioeconomic History  . Marital status: Single    Spouse name: Not on file  . Number of children: Not on file  . Years of education: Not on file  . Highest education level: 12th grade  Occupational History  . Occupation: retired  Tobacco Use  . Smoking status: Never  Smoker  . Smokeless tobacco: Never Used  Vaping Use  . Vaping Use: Never used  Substance and Sexual Activity  . Alcohol use: No    Alcohol/week: 0.0 standard drinks  . Drug use: No  . Sexual activity: Not on file  Other Topics Concern  . Not on file  Social History Narrative  . Not on file   Social Determinants of Health   Financial Resource Strain: Low Risk   . Difficulty of Paying Living Expenses: Not hard at all  Food Insecurity: No Food Insecurity  . Worried About Charity fundraiser in the Last Year: Never true  . Ran Out of Food in the Last Year: Never true  Transportation Needs: No Transportation Needs  . Lack of Transportation (Medical): No  . Lack of Transportation (Non-Medical): No  Physical Activity: Insufficiently Active  . Days of Exercise per Week: 2 days  . Minutes of Exercise per Session: 50 min  Stress: No Stress Concern Present  . Feeling of Stress : Not at all  Social Connections: Not on file  Intimate Partner Violence: Not on file    Outpatient Medications Prior to Visit  Medication Sig Dispense Refill  . acetaminophen (TYLENOL) 500 MG tablet Take 500 mg by mouth every 6 (six) hours as needed.    Marland Kitchen  amLODipine (NORVASC) 10 MG tablet TAKE 1 TABLET EVERY DAY 90 tablet 0  . benazepril-hydrochlorthiazide (LOTENSIN HCT) 20-12.5 MG tablet TAKE 1 TABLET EVERY DAY 90 tablet 0  . carvedilol (COREG) 25 MG tablet TAKE 2 TABLETS TWICE DAILY 360 tablet 0  . cetirizine (ZYRTEC) 10 MG tablet Take 10 mg by mouth as needed.     . cloNIDine (CATAPRES) 0.3 MG tablet TAKE 1 TABLET TWICE DAILY 180 tablet 0  . docusate sodium (COLACE) 100 MG capsule Take 100 mg by mouth every other day.     . doxazosin (CARDURA) 2 MG tablet TAKE 1 TABLET EVERY MORNING 90 tablet 0  . empagliflozin (JARDIANCE) 25 MG TABS tablet Take 1 tablet (25 mg total) by mouth daily before breakfast. 90 tablet 1  . furosemide (LASIX) 40 MG tablet TAKE 1 TABLET EVERY DAY 90 tablet 0  . metFORMIN  (GLUCOPHAGE) 500 MG tablet TAKE 1 TABLET TWICE DAILY 180 tablet 0  . omeprazole (PRILOSEC OTC) 20 MG tablet Take 20 mg by mouth as needed.     . potassium chloride SA (KLOR-CON) 20 MEQ tablet TAKE 1 TABLET EVERY DAY 90 tablet 0  . pravastatin (PRAVACHOL) 40 MG tablet TAKE 1 TABLET AT BEDTIME 90 tablet 0  . senna (SENOKOT) 8.6 MG TABS tablet Take 2 tablets (17.2 mg total) by mouth at bedtime as needed for mild constipation. 120 each 0  . triamcinolone (KENALOG) 0.1 % Apply 1 application topically 2 (two) times daily. 30 g 0  . aspirin EC 81 MG tablet Take 81 mg by mouth daily. (Patient not taking: No sig reported)    . traMADol (ULTRAM) 50 MG tablet Take 1 tablet (50 mg total) by mouth every 6 (six) hours as needed for moderate pain or severe pain. (Patient not taking: Reported on 01/31/2021) 20 tablet 0   No facility-administered medications prior to visit.    Allergies  Allergen Reactions  . Oxycontin [Oxycodone] Nausea And Vomiting    ROS Review of Systems  Constitutional: Positive for fatigue.  Respiratory: Negative.   Cardiovascular: Negative.   Gastrointestinal: Negative.   Genitourinary: Negative.   Musculoskeletal: Positive for arthralgias (controlled with tylenol and aspercreme).  Skin: Negative.   Neurological: Negative.       Objective:    Physical Exam Vitals and nursing note reviewed.  Constitutional:      Appearance: Normal appearance.  HENT:     Head: Normocephalic.  Eyes:     Conjunctiva/sclera: Conjunctivae normal.  Cardiovascular:     Rate and Rhythm: Normal rate and regular rhythm.     Pulses: Normal pulses.     Heart sounds: Normal heart sounds.  Pulmonary:     Effort: Pulmonary effort is normal.     Breath sounds: Normal breath sounds.  Abdominal:     Palpations: Abdomen is soft.     Tenderness: There is no abdominal tenderness.  Musculoskeletal:     Cervical back: Normal range of motion.     Right lower leg: Edema (1+ pitting edema to calf)  present.     Left lower leg: Edema (1+ pitting edema to calf) present.  Skin:    General: Skin is warm and dry.     Findings: Erythema (bilateral lower extremities discolored) present.  Neurological:     General: No focal deficit present.     Mental Status: Ryan Kirby is alert and oriented to person, place, and time.     BP 129/71   Pulse 79   Temp 99.4 F (37.4  C) (Oral)   Ht 5' 9.88" (1.775 m)   Wt 179 lb 9.6 oz (81.5 kg)   SpO2 98%   BMI 25.86 kg/m  Wt Readings from Last 3 Encounters:  01/31/21 179 lb 9.6 oz (81.5 kg)  12/19/20 181 lb 3.2 oz (82.2 kg)  11/30/20 189 lb (85.7 kg)     Health Maintenance Due  Topic Date Due  . Zoster Vaccines- Shingrix (1 of 2) Never done  . COVID-19 Vaccine (3 - Pfizer risk 4-dose series) 12/01/2019    There are no preventive care reminders to display for this patient.  Lab Results  Component Value Date   TSH 3.220 12/12/2020   Lab Results  Component Value Date   WBC 6.8 12/12/2020   HGB 15.0 12/12/2020   HCT 44.7 12/12/2020   MCV 91 12/12/2020   PLT 193 12/12/2020   Lab Results  Component Value Date   NA 144 01/24/2021   K 3.2 (L) 01/24/2021   CO2 24 01/24/2021   GLUCOSE 173 (H) 01/24/2021   BUN 16 01/24/2021   CREATININE 1.31 (H) 01/24/2021   BILITOT 0.5 01/24/2021   ALKPHOS 87 01/24/2021   AST 15 01/24/2021   ALT 12 01/24/2021   PROT 6.5 01/24/2021   ALBUMIN 4.3 01/24/2021   CALCIUM 10.3 (H) 01/24/2021   ANIONGAP 10 05/30/2020   EGFR 55 (L) 01/24/2021   Lab Results  Component Value Date   CHOL 142 12/12/2020   Lab Results  Component Value Date   HDL 53 12/12/2020   Lab Results  Component Value Date   LDLCALC 64 12/12/2020   Lab Results  Component Value Date   TRIG 145 12/12/2020   Lab Results  Component Value Date   CHOLHDL 2.7 12/12/2020   Lab Results  Component Value Date   HGBA1C 8.7 (H) 01/24/2021      Assessment & Plan:   Problem List Items Addressed This Visit      Other   Hypokalemia -  Primary    Potassium noted last week to be 3.2. Ryan Kirby is currently taking 40mq potassium daily. Will recheck BMP today and treat based on results.       Relevant Orders   Basic Metabolic Panel (BMET)   Edema leg    Chronic, stable. Saw Dr. FUbaldo Glassing5/19/22 and note reviewed. Recommends continuing current regimen and routinely follow-up with patient. His blood pressure is well controlled today, however was in the 1638Tsystolic in prior visits. Can consider decreasing his amlodipine at next visit if his blood pressure still remains controlled. Encouraged him to check his blood pressure at home. Continue watching and limiting the amount of salt in his diet. Ryan Kirby can also wear compression socks during the day to help with the swelling. Follow-up in 2 months.          No orders of the defined types were placed in this encounter.   Follow-up: Return in about 2 months (around 04/02/2021) for HTN, diabetes with lab visit 1 week earlier.    LCharyl Dancer NP

## 2021-01-31 NOTE — Assessment & Plan Note (Signed)
Potassium noted last week to be 3.2. He is currently taking 10mEq potassium daily. Will recheck BMP today and treat based on results.

## 2021-01-31 NOTE — Patient Instructions (Signed)
Edema  Edema is when you have too much fluid in your body or under your skin. Edema may make your legs, feet, and ankles swell up. Swelling is also common in looser tissues, like around your eyes. This is a common condition. It gets more common as you get older. There are many possible causes of edema. Eating too much salt (sodium) and being on your feet or sitting for a long time can cause edema in your legs, feet, and ankles. Hot weather may make edema worse. Edema is usually painless. Your skin may look swollen or shiny. Follow these instructions at home:  Keep the swollen body part raised (elevated) above the level of your heart when you are sitting or lying down.  Do not sit still or stand for a long time.  Do not wear tight clothes. Do not wear garters on your upper legs.  Exercise your legs. This can help the swelling go down.  Wear elastic bandages or support stockings as told by your doctor.  Eat a low-salt (low-sodium) diet to reduce fluid as told by your doctor.  Depending on the cause of your swelling, you may need to limit how much fluid you drink (fluid restriction).  Take over-the-counter and prescription medicines only as told by your doctor. Contact a doctor if:  Treatment is not working.  You have heart, liver, or kidney disease and have symptoms of edema.  You have sudden and unexplained weight gain. Get help right away if:  You have shortness of breath or chest pain.  You cannot breathe when you lie down.  You have pain, redness, or warmth in the swollen areas.  You have heart, liver, or kidney disease and get edema all of a sudden.  You have a fever and your symptoms get worse all of a sudden. Summary  Edema is when you have too much fluid in your body or under your skin.  Edema may make your legs, feet, and ankles swell up. Swelling is also common in looser tissues, like around your eyes.  Raise (elevate) the swollen body part above the level of your  heart when you are sitting or lying down.  Follow your doctor's instructions about diet and how much fluid you can drink (fluid restriction). This information is not intended to replace advice given to you by your health care provider. Make sure you discuss any questions you have with your health care provider. Document Revised: 06/15/2020 Document Reviewed: 06/15/2020 Elsevier Patient Education  2021 [employer]  

## 2021-01-31 NOTE — Assessment & Plan Note (Signed)
Chronic, stable. Saw Dr. Ubaldo Glassing 01/19/21 and note reviewed. Recommends continuing current regimen and routinely follow-up with patient. His blood pressure is well controlled today, however was in the 396D systolic in prior visits. Can consider decreasing his amlodipine at next visit if his blood pressure still remains controlled. Encouraged him to check his blood pressure at home. Continue watching and limiting the amount of salt in his diet. He can also wear compression socks during the day to help with the swelling. Follow-up in 2 months.

## 2021-02-01 LAB — BASIC METABOLIC PANEL
BUN/Creatinine Ratio: 11 (ref 10–24)
BUN: 15 mg/dL (ref 8–27)
CO2: 24 mmol/L (ref 20–29)
Calcium: 10.2 mg/dL (ref 8.6–10.2)
Chloride: 98 mmol/L (ref 96–106)
Creatinine, Ser: 1.39 mg/dL — ABNORMAL HIGH (ref 0.76–1.27)
Glucose: 308 mg/dL — ABNORMAL HIGH (ref 65–99)
Potassium: 3.5 mmol/L (ref 3.5–5.2)
Sodium: 140 mmol/L (ref 134–144)
eGFR: 52 mL/min/{1.73_m2} — ABNORMAL LOW (ref >59)

## 2021-02-01 MED ORDER — GLIPIZIDE 5 MG PO TABS: 5.0000 mg | ORAL_TABLET | Freq: Every day | ORAL | 0 refills | Status: DC

## 2021-02-01 NOTE — Addendum Note (Signed)
Addended by: Vance Peper A on: 02/01/2021 09:41 AM   Modules accepted: Orders

## 2021-02-09 DIAGNOSIS — D3131 Benign neoplasm of right choroid: Secondary | ICD-10-CM | POA: Diagnosis not present

## 2021-02-09 DIAGNOSIS — H2513 Age-related nuclear cataract, bilateral: Secondary | ICD-10-CM | POA: Diagnosis not present

## 2021-02-09 LAB — HM DIABETES EYE EXAM

## 2021-02-22 ENCOUNTER — Ambulatory Visit: Payer: Self-pay

## 2021-02-22 ENCOUNTER — Telehealth: Payer: Self-pay | Admitting: Pharmacist

## 2021-02-22 NOTE — Telephone Encounter (Signed)
  Chronic Care Management   Outreach Note  02/22/2021 Name: Ryan Kirby MRN: [id number] DOB: 05/20/1941  Referred by: Charlynne Cousins, MD Reason for referral : Chronic Care Management  An unsuccessful telephone outreach was attempted today. The patient was referred to the pharmacist for assistance with care management and care coordination.  Left HIPAA compliant message for patient to return my call at his convenience.    Follow Up Plan: Will collaborate with CPA to outreach patient for reschedule with me.  Junita Push. Kenton Kingfisher PharmD, [place] [facility] [phone]

## 2021-02-22 NOTE — Progress Notes (Deleted)
Chronic Care Management Pharmacy Note  02/22/2021 Name:  Ryan Kirby MRN:  [id number] DOB:  01/23/1941  Subjective: Ryan Kirby is an 80 y.o. year old male who is a primary patient of Vigg, Avanti, MD.  The CCM team was consulted for assistance with disease management and care coordination needs.    Engaged with patient face to face for initial visit in response to provider referral for pharmacy case management and/or care coordination services.   Consent to Services:  The patient was given the following information about Chronic Care Management services today, agreed to services, and gave verbal consent: 1. CCM service includes personalized support from designated clinical staff supervised by the primary care provider, including individualized plan of care and coordination with other care providers 2. 24/7 contact phone numbers for assistance for urgent and routine care needs. 3. Service will only be billed when office clinical staff spend 20 minutes or more in a month to coordinate care. 4. Only one practitioner may furnish and bill the service in a calendar month. 5.The patient may stop CCM services at any time (effective at the end of the month) by phone call to the office staff. 6. The patient will be responsible for cost sharing (co-pay) of up to 20% of the service fee (after annual deductible is met). Patient agreed to services and consent obtained.  Patient Care Team: Charlynne Cousins, MD as PCP - General Guadalupe Maple, MD as PCP - Family Medicine (Family Medicine) Garrel Ridgel, [place] as Consulting Physician (Podiatry) Vladimir Faster, [facility] as Pharmacist (Pharmacist)  Recent office visits: 12/19/20- Vigg (PCP)-blood work for next appt, referral cards for echo 2/2 leg swelling 11/30/20-Vigg (PCP)- blood work, referral to ortho & cards, stop glipizide, A1c 8.0  Recent consult visits:   Hospital visits: {Hospital DC Yes/No:25215}  Objective:  Lab Results  Component Value Date    CREATININE 1.39 (H) 01/31/2021   BUN 15 01/31/2021   GFRNONAA 54 (L) 08/18/2020   GFRAA 62 08/18/2020   NA 140 01/31/2021   K 3.5 01/31/2021   CALCIUM 10.2 01/31/2021   CO2 24 01/31/2021   GLUCOSE 308 (H) 01/31/2021    Lab Results  Component Value Date/Time   HGBA1C 8.7 (H) 01/24/2021 08:02 AM   HGBA1C 8.0 (H) 12/12/2020 08:15 AM   HGBA1C 7.4 (H) 11/30/2020 11:54 AM   MICROALBUR 10 12/12/2020 08:14 AM   MICROALBUR 10 08/18/2020 09:10 AM    Last diabetic Eye exam:  Lab Results  Component Value Date/Time   HMDIABEYEEXA No Retinopathy 02/09/2021 12:00 AM    Last diabetic Foot exam: No results found for: HMDIABFOOTEX   Lab Results  Component Value Date   CHOL 142 12/12/2020   HDL 53 12/12/2020   LDLCALC 64 12/12/2020   TRIG 145 12/12/2020   CHOLHDL 2.7 12/12/2020    Hepatic Function Latest Ref Rng & Units 01/24/2021 12/12/2020 11/30/2020  Total Protein 6.0 - 8.5 g/dL 6.5 6.8 6.7  Albumin 3.7 - 4.7 g/dL 4.3 4.6 4.4  AST 0 - 40 IU/L '15 20 17  ' ALT 0 - 44 IU/L '12 11 10  ' Alk Phosphatase 44 - 121 IU/L 87 92 87  Total Bilirubin 0.0 - 1.2 mg/dL 0.5 0.5 0.4    Lab Results  Component Value Date/Time   TSH 3.220 12/12/2020 08:15 AM   TSH 3.260 08/18/2020 09:19 AM    CBC Latest Ref Rng & Units 12/12/2020 06/01/2020 05/26/2020  WBC 3.4 - 10.8 x10E3/uL 6.8 - 7.3  Hemoglobin 13.0 - 17.7 g/dL 15.0 13.6 15.4  Hematocrit 37.5 - 51.0 % 44.7 40.0 43.7  Platelets 150 - 450 x10E3/uL 193 - 208    No results found for: VD25OH  Clinical ASCVD: {YES/NO:21197} The 10-year ASCVD risk score Mikey Bussing DC Jr., et al., 2013) is: 54.3%   Values used to calculate the score:     Age: 29 years     Sex: Male     Is Non-Hispanic African American: No     Diabetic: Yes     Tobacco smoker: No     Systolic Blood Pressure: 409 mmHg     Is BP treated: Yes     HDL Cholesterol: 53 mg/dL     Total Cholesterol: 142 mg/dL    Depression screen [facility] 2/9 01/31/2021 11/30/2020 04/15/2020  Decreased Interest 0 0 0   Down, Depressed, Hopeless 0 0 0  PHQ - 2 Score 0 0 0  Altered sleeping - - -  Tired, decreased energy - - -  Change in appetite - - -  Feeling bad or failure about yourself  - - -  Trouble concentrating - - -  Moving slowly or fidgety/restless - - -  Suicidal thoughts - - -  PHQ-9 Score - - -  Difficult doing work/chores - - -     ***Other: (CHADS2VASc if Afib, MMRC or CAT for COPD, ACT, DEXA)  Social History   Tobacco Use  Smoking Status Never  Smokeless Tobacco Never   BP Readings from Last 3 Encounters:  01/31/21 129/71  12/19/20 140/77  08/18/20 130/64   Pulse Readings from Last 3 Encounters:  01/31/21 79  12/19/20 74  08/18/20 68   Wt Readings from Last 3 Encounters:  01/31/21 179 lb 9.6 oz (81.5 kg)  12/19/20 181 lb 3.2 oz (82.2 kg)  11/30/20 189 lb (85.7 kg)   BMI Readings from Last 3 Encounters:  01/31/21 25.86 kg/m  12/19/20 26.09 kg/m  11/30/20 27.06 kg/m    Assessment/Interventions: Review of patient past medical history, allergies, medications, health status, including review of consultants reports, laboratory and other test data, was performed as part of comprehensive evaluation and provision of chronic care management services.   SDOH:  (Social Determinants of Health) assessments and interventions performed: {yes/no:20286}  SDOH Screenings   Alcohol Screen: Not on file  Depression (PHQ2-9): Low Risk    PHQ-2 Score: 0  Financial Resource Strain: Low Risk    Difficulty of Paying Living Expenses: Not hard at all  Food Insecurity: No Food Insecurity   Worried About Charity fundraiser in the Last Year: Never true   Ran Out of Food in the Last Year: Never true  Housing: Not on file  Physical Activity: Insufficiently Active   Days of Exercise per Week: 2 days   Minutes of Exercise per Session: 50 min  Social Connections: Not on file  Stress: No Stress Concern Present   Feeling of Stress : Not at all  Tobacco Use: Low Risk    Smoking Tobacco  Use: Never   Smokeless Tobacco Use: Never  Transportation Needs: No Transportation Needs   Lack of Transportation (Medical): No   Lack of Transportation (Non-Medical): No    CCM Care Plan  Allergies  Allergen Reactions   Oxycontin [Oxycodone] Nausea And Vomiting    Medications Reviewed Today     Reviewed by Charyl Dancer, NP (Nurse Practitioner) on 01/31/21 at 1342  Med List Status: <None>   Medication Order Taking? Sig Documenting Provider  Last Dose Status Informant  acetaminophen (TYLENOL) 500 MG tablet [id number] Yes Take 500 mg by mouth every 6 (six) hours as needed. [provider] Taking Active Self  amLODipine (NORVASC) 10 MG tablet [id number] Yes TAKE 1 TABLET EVERY DAY Cannady, Jolene T, NP Taking Active   aspirin EC 81 MG tablet [id number] No Take 81 mg by mouth daily.  Patient not taking: No sig reported   [provider] Not Taking Active   benazepril-hydrochlorthiazide (LOTENSIN HCT) 20-12.5 MG tablet [id number] Yes TAKE 1 TABLET EVERY DAY Cannady, Jolene T, NP Taking Active   carvedilol (COREG) 25 MG tablet [id number] Yes TAKE 2 TABLETS TWICE DAILY Cannady, Jolene T, NP Taking Active   cetirizine (ZYRTEC) 10 MG tablet [id number] Yes Take 10 mg by mouth as needed.  [provider] Taking Active Self           Med Note Nat Christen   Fri May 20, 2020 10:10 AM)    cloNIDine (CATAPRES) 0.3 MG tablet [id number] Yes TAKE 1 TABLET TWICE DAILY Cannady, Jolene T, NP Taking Active   docusate sodium (COLACE) 100 MG capsule [id number] Yes Take 100 mg by mouth every other day.  [provider] Taking Active Self  doxazosin (CARDURA) 2 MG tablet [id number] Yes TAKE 1 TABLET EVERY MORNING Cannady, Jolene T, NP Taking Active   empagliflozin (JARDIANCE) 25 MG TABS tablet [id number] Yes Take 1 tablet (25 mg total) by mouth daily before breakfast. Vigg, Avanti, MD Taking Active   furosemide (LASIX) 40 MG tablet [id number] Yes TAKE 1 TABLET EVERY DAY  Cannady, Jolene T, NP Taking Active   metFORMIN (GLUCOPHAGE) 500 MG tablet [id number] Yes TAKE 1 TABLET TWICE DAILY Jon Billings, NP Taking Active   omeprazole (PRILOSEC OTC) 20 MG tablet [id number] Yes Take 20 mg by mouth as needed.  [provider] Taking Active Self  potassium chloride SA (KLOR-CON) 20 MEQ tablet [id number] Yes TAKE 1 TABLET EVERY DAY Cannady, Jolene T, NP Taking Active   pravastatin (PRAVACHOL) 40 MG tablet [id number] Yes TAKE 1 TABLET AT BEDTIME Cannady, Jolene T, NP Taking Active   senna (SENOKOT) 8.6 MG TABS tablet [id number] Yes Take 2 tablets (17.2 mg total) by mouth at bedtime as needed for mild constipation. Earleen Newport, MD Taking Active Self  traMADol Veatrice Bourbon) 50 MG tablet [id number] No Take 1 tablet (50 mg total) by mouth every 6 (six) hours as needed for moderate pain or severe pain.  Patient not taking: Reported on 01/31/2021   Fredirick Maudlin, MD Not Taking Active   triamcinolone (KENALOG) 0.1 % [id number] Yes Apply 1 application topically 2 (two) times daily. Marnee Guarneri T, NP Taking Active             Patient Active Problem List   Diagnosis Date Noted   Edema leg 01/31/2021   Hypokalemia 08/18/2020   PVD (peripheral vascular disease) ([place]) 08/18/2020   Status post inguinal hernia repair using synthetic patch 06/23/2020   Unilateral recurrent inguinal hernia without obstruction or gangrene 05/12/2020   Hip pain 09/22/2018   CKD stage 3 secondary to diabetes ([place]) 07/02/2018   Advanced care planning/counseling discussion 12/24/2017   GERD (gastroesophageal reflux disease) 12/06/2016   BPH (benign prostatic hyperplasia) 12/06/2016   Hyperlipidemia associated with type 2 diabetes mellitus ([place])    Hypertension associated with diabetes ([place])    CAD (coronary artery disease)    Heart murmur 03/04/2014   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   Intermittent alternating exotropia 12/17/2012  Preglaucoma 12/17/2012    Immunization  History  Administered Date(s) Administered   Fluad Quad(high Dose 65+) 07/27/2019, 08/18/2020   Influenza, High Dose Seasonal PF 07/17/2016, 06/24/2017, 07/02/2018   Influenza,inj,Quad PF,6+ Mos 06/02/2015   PFIZER(Purple Top)SARS-COV-2 Vaccination 10/13/2019, 11/03/2019   Pneumococcal Conjugate-13 12/01/2015   Pneumococcal-Unspecified 07/01/2007   Td 05/25/2008, 07/02/2018    Conditions to be addressed/monitored:  {USCCMDZASSESSMENTOPTIONS:23563}  There are no care plans that you recently modified to display for this patient.    Medication Assistance: {MEDASSISTANCEINFO:25044}  Patient's preferred pharmacy is:  [place], [place] [address] Phone: [phone] Fax: [phone]  [facility] ([facility] - [place], [place] [address] Phone: [phone] Fax: [phone]  Uses pill box? {Yes or If no, why not?:20788} Pt endorses ***% compliance  We discussed: {Pharmacy options:24294} Patient decided to: {US Pharmacy Plan:23885}  Care Plan and Follow Up Patient Decision:  {FOLLOWUP:24991}  Plan: {CM FOLLOW UP XIHW:38882}    Current Barriers:  {pharmacybarriers:24917}  Pharmacist Clinical Goal(s):  Patient will {PHARMACYGOALCHOICES:24921} through collaboration with PharmD and provider.   Interventions: 1:1 collaboration with Charlynne Cousins, MD regarding development and update of comprehensive plan of care as evidenced by provider attestation and co-signature Inter-disciplinary care team collaboration (see longitudinal plan of care) Comprehensive medication review performed; medication list updated in electronic medical record BP Readings from Last 3 Encounters:  01/31/21 129/71  12/19/20 140/77  08/18/20 130/64    Hypertension (BP goal {CHL HP UPSTREAM Pharmacist BP ranges:[phone]}) -{US  controlled/uncontrolled:25276} -Current treatment:  -Medications previously tried: ***  -Current home readings: *** -Current dietary habits: *** -Current exercise habits: *** -{ACTIONS;DENIES/REPORTS:21021675::"Denies"} hypotensive/hypertensive symptoms -Educated on {CCM BP Counseling:25124} -Counseled to monitor BP at home ***, document, and provide log at future appointments -{CCMPHARMDINTERVENTION:25122}  Diabetes (A1c goal {A1c goals:23924}) -{US controlled/uncontrolled:25276} -Current medications:  -Medications previously tried: ***  -Current home glucose readings fasting glucose: *** post prandial glucose: *** -{ACTIONS;DENIES/REPORTS:21021675::"Denies"} hypoglycemic/hyperglycemic symptoms -Current meal patterns:  breakfast: ***  lunch: ***  dinner: *** snacks: *** drinks: *** -Current exercise: *** -Educated on {CCM DM COUNSELING:25123} -Counseled to check feet daily and get yearly eye exams -{CCMPHARMDINTERVENTION:25122}    Hyperlipidemia: (LDL goal < ***) -{US controlled/uncontrolled:25276} -Current treatment:  -Medications previously tried: ***  -Current dietary patterns: *** -Current exercise habits: *** -Educated on {CCM HLD Counseling:25126} -{CCMPHARMDINTERVENTION:25122}  (Goal: ***) -{US controlled/uncontrolled:25276} -Current treatment   -Medications previously tried: ***  -{CCMPHARMDINTERVENTION:25122}  Health Maintenance -Vaccine gaps: *** -Current therapy:   -Educated on {ccm supplement counseling:25128} -{CCM Patient satisfied:25129} -{CCMPHARMDINTERVENTION:25122}   Patient Goals/Self-Care Activities Patient will:  - {pharmacypatientgoals:24919}  Follow Up Plan: {CM FOLLOW UP CMKL:49179}

## 2021-03-29 ENCOUNTER — Other Ambulatory Visit: Payer: Medicare HMO

## 2021-03-29 ENCOUNTER — Other Ambulatory Visit: Payer: Self-pay

## 2021-03-29 DIAGNOSIS — E119 Type 2 diabetes mellitus without complications: Secondary | ICD-10-CM | POA: Diagnosis not present

## 2021-03-30 LAB — BASIC METABOLIC PANEL
BUN/Creatinine Ratio: 11 (ref 10–24)
BUN: 14 mg/dL (ref 8–27)
CO2: 24 mmol/L (ref 20–29)
Calcium: 10.2 mg/dL (ref 8.6–10.2)
Chloride: 101 mmol/L (ref 96–106)
Creatinine, Ser: 1.23 mg/dL (ref 0.76–1.27)
Glucose: 132 mg/dL — ABNORMAL HIGH (ref 65–99)
Potassium: 3.7 mmol/L (ref 3.5–5.2)
Sodium: 140 mmol/L (ref 134–144)
eGFR: 60 mL/min/{1.73_m2} (ref >59)

## 2021-03-30 LAB — CBC WITH DIFFERENTIAL/PLATELET
Basophils Absolute: 0.1 10*3/uL (ref 0.0–0.2)
Basos: 1 %
EOS (ABSOLUTE): 0.4 10*3/uL (ref 0.0–0.4)
Eos: 6 %
Hematocrit: 43 % (ref 37.5–51.0)
Hemoglobin: 14.7 g/dL (ref 13.0–17.7)
Immature Grans (Abs): 0 10*3/uL (ref 0.0–0.1)
Immature Granulocytes: 0 %
Lymphocytes Absolute: 1 10*3/uL (ref 0.7–3.1)
Lymphs: 16 %
MCH: 31.2 pg (ref 26.6–33.0)
MCHC: 34.2 g/dL (ref 31.5–35.7)
MCV: 91 fL (ref 79–97)
Monocytes Absolute: 0.4 10*3/uL (ref 0.1–0.9)
Monocytes: 5 %
Neutrophils Absolute: 4.8 10*3/uL (ref 1.4–7.0)
Neutrophils: 72 %
Platelets: 172 10*3/uL (ref 150–450)
RBC: 4.71 x10E6/uL (ref 4.14–5.80)
RDW: 12.2 % (ref 11.6–15.4)
WBC: 6.6 10*3/uL (ref 3.4–10.8)

## 2021-03-30 LAB — HEMOGLOBIN A1C
Est. average glucose Bld gHb Est-mCnc: 166 mg/dL
Hgb A1c MFr Bld: 7.4 % — ABNORMAL HIGH (ref 4.8–5.6)

## 2021-04-03 ENCOUNTER — Ambulatory Visit: Payer: Medicare HMO

## 2021-04-03 ENCOUNTER — Encounter: Payer: Self-pay | Admitting: Internal Medicine

## 2021-04-03 ENCOUNTER — Ambulatory Visit (INDEPENDENT_AMBULATORY_CARE_PROVIDER_SITE_OTHER): Payer: Medicare HMO | Admitting: Internal Medicine

## 2021-04-03 ENCOUNTER — Other Ambulatory Visit: Payer: Self-pay

## 2021-04-03 VITALS — BP 135/67 | HR 65 | Temp 98.8°F | Ht 70.08 in | Wt 180.0 lb

## 2021-04-03 DIAGNOSIS — I152 Hypertension secondary to endocrine disorders: Secondary | ICD-10-CM | POA: Diagnosis not present

## 2021-04-03 DIAGNOSIS — E119 Type 2 diabetes mellitus without complications: Secondary | ICD-10-CM | POA: Diagnosis not present

## 2021-04-03 DIAGNOSIS — E1159 Type 2 diabetes mellitus with other circulatory complications: Secondary | ICD-10-CM | POA: Diagnosis not present

## 2021-04-03 DIAGNOSIS — E1169 Type 2 diabetes mellitus with other specified complication: Secondary | ICD-10-CM

## 2021-04-03 DIAGNOSIS — R600 Localized edema: Secondary | ICD-10-CM | POA: Diagnosis not present

## 2021-04-03 DIAGNOSIS — E785 Hyperlipidemia, unspecified: Secondary | ICD-10-CM

## 2021-04-03 MED ORDER — GLIPIZIDE 5 MG PO TABS: 5.0000 mg | ORAL_TABLET | Freq: Every day | ORAL | 1 refills | Status: DC

## 2021-04-03 NOTE — Progress Notes (Signed)
BP 135/67   Pulse 65   Temp 98.8 F (37.1 C) (Oral)   Ht 5' 10.08" (1.78 m)   Wt 180 lb (81.6 kg)   SpO2 98%   BMI 25.77 kg/m    Subjective:    Patient ID: Ryan Kirby, male    DOB: 1940/12/17, 80 y.o.   MRN: [id number]  Chief Complaint  Patient presents with   Diabetes   Hypertension   Hyperlipidemia   Benign Prostatic Hypertrophy    HPI: Ryan Kirby is a 80 y.o. male  Diabetes He presents for his follow-up diabetic visit. He has type 2 diabetes mellitus.  Hypertension This is a chronic problem. The current episode started more than 1 month ago. The problem is controlled. Pertinent negatives include no anxiety or malaise/fatigue. There is no history of chronic renal disease.  Hyperlipidemia This is a chronic problem. The problem is controlled. He has no history of chronic renal disease, diabetes or hypothyroidism.  Benign Prostatic Hypertrophy This is a chronic problem.   Chief Complaint  Patient presents with   Diabetes   Hypertension   Hyperlipidemia   Benign Prostatic Hypertrophy    Relevant past medical, surgical, family and social history reviewed and updated as indicated. Interim medical history since our last visit reviewed. Allergies and medications reviewed and updated.  Review of Systems  Constitutional:  Negative for malaise/fatigue.   Per HPI unless specifically indicated above     Objective:    BP 135/67   Pulse 65   Temp 98.8 F (37.1 C) (Oral)   Ht 5' 10.08" (1.78 m)   Wt 180 lb (81.6 kg)   SpO2 98%   BMI 25.77 kg/m   Wt Readings from Last 3 Encounters:  04/03/21 180 lb (81.6 kg)  01/31/21 179 lb 9.6 oz (81.5 kg)  12/19/20 181 lb 3.2 oz (82.2 kg)    Physical Exam Vitals (hard of hearing) and nursing note reviewed.  Constitutional:      General: He is not in acute distress.    Appearance: Normal appearance. He is not ill-appearing or diaphoretic.  HENT:     Head: Normocephalic and atraumatic.     Right Ear: Tympanic  membrane and external ear normal. There is no impacted cerumen.     Left Ear: External ear normal.     Nose: No congestion or rhinorrhea.     Mouth/Throat:     Pharynx: No oropharyngeal exudate or posterior oropharyngeal erythema.  Eyes:     Conjunctiva/sclera: Conjunctivae normal.     Pupils: Pupils are equal, round, and reactive to light.  Cardiovascular:     Rate and Rhythm: Normal rate and regular rhythm.     Heart sounds: No murmur heard.   No friction rub. No gallop.  Pulmonary:     Effort: No respiratory distress.     Breath sounds: No stridor. No wheezing or rhonchi.  Chest:     Chest wall: No tenderness.  Abdominal:     General: Abdomen is flat. Bowel sounds are normal.     Palpations: Abdomen is soft. There is no mass.     Tenderness: There is no abdominal tenderness.  Musculoskeletal:        General: Swelling present.     Cervical back: Normal range of motion and neck supple. No rigidity or tenderness.     Right lower leg: No edema.     Left lower leg: No edema.  Skin:    General: Skin is warm and  dry.  Neurological:     Mental Status: He is alert.    Results for orders placed or performed in visit on 03/29/21  HgB A1c  Result Value Ref Range   Hgb A1c MFr Bld 7.4 (H) 4.8 - 5.6 %   Est. average glucose Bld gHb Est-mCnc 166 mg/dL  CBC with Differential  Result Value Ref Range   WBC 6.6 3.4 - 10.8 x10E3/uL   RBC 4.71 4.14 - 5.80 x10E6/uL   Hemoglobin 14.7 13.0 - 17.7 g/dL   Hematocrit 43.0 37.5 - 51.0 %   MCV 91 79 - 97 fL   MCH 31.2 26.6 - 33.0 pg   MCHC 34.2 31.5 - 35.7 g/dL   RDW 12.2 11.6 - 15.4 %   Platelets 172 150 - 450 x10E3/uL   Neutrophils 72 Not Estab. %   Lymphs 16 Not Estab. %   Monocytes 5 Not Estab. %   Eos 6 Not Estab. %   Basos 1 Not Estab. %   Neutrophils Absolute 4.8 1.4 - 7.0 x10E3/uL   Lymphocytes Absolute 1.0 0.7 - 3.1 x10E3/uL   Monocytes Absolute 0.4 0.1 - 0.9 x10E3/uL   EOS (ABSOLUTE) 0.4 0.0 - 0.4 x10E3/uL   Basophils  Absolute 0.1 0.0 - 0.2 x10E3/uL   Immature Granulocytes 0 Not Estab. %   Immature Grans (Abs) 0.0 0.0 - 0.1 Y19J0/DT  Basic Metabolic Panel (BMET)  Result Value Ref Range   Glucose 132 (H) 65 - 99 mg/dL   BUN 14 8 - 27 mg/dL   Creatinine, Ser 1.23 0.76 - 1.27 mg/dL   eGFR 60 >59 mL/min/1.73   BUN/Creatinine Ratio 11 10 - 24   Sodium 140 134 - 144 mmol/L   Potassium 3.7 3.5 - 5.2 mmol/L   Chloride 101 96 - 106 mmol/L   CO2 24 20 - 29 mmol/L   Calcium 10.2 8.6 - 10.2 mg/dL        Current Outpatient Medications:    acetaminophen (TYLENOL) 500 MG tablet, Take 500 mg by mouth every 6 (six) hours as needed., Disp: , Rfl:    amLODipine (NORVASC) 10 MG tablet, TAKE 1 TABLET EVERY DAY, Disp: 90 tablet, Rfl: 0   aspirin EC 81 MG tablet, Take 81 mg by mouth daily., Disp: , Rfl:    benazepril-hydrochlorthiazide (LOTENSIN HCT) 20-12.5 MG tablet, TAKE 1 TABLET EVERY DAY, Disp: 90 tablet, Rfl: 0   carvedilol (COREG) 25 MG tablet, TAKE 2 TABLETS TWICE DAILY, Disp: 360 tablet, Rfl: 0   cetirizine (ZYRTEC) 10 MG tablet, Take 10 mg by mouth as needed. , Disp: , Rfl:    cloNIDine (CATAPRES) 0.3 MG tablet, TAKE 1 TABLET TWICE DAILY, Disp: 180 tablet, Rfl: 0   docusate sodium (COLACE) 100 MG capsule, Take 100 mg by mouth every other day. , Disp: , Rfl:    doxazosin (CARDURA) 2 MG tablet, TAKE 1 TABLET EVERY MORNING, Disp: 90 tablet, Rfl: 0   empagliflozin (JARDIANCE) 25 MG TABS tablet, Take 1 tablet (25 mg total) by mouth daily before breakfast., Disp: 90 tablet, Rfl: 1   furosemide (LASIX) 40 MG tablet, TAKE 1 TABLET EVERY DAY, Disp: 90 tablet, Rfl: 0   glipiZIDE (GLUCOTROL) 5 MG tablet, Take 1 tablet (5 mg total) by mouth daily before breakfast., Disp: 90 tablet, Rfl: 0   metFORMIN (GLUCOPHAGE) 500 MG tablet, TAKE 1 TABLET TWICE DAILY, Disp: 180 tablet, Rfl: 0   omeprazole (PRILOSEC OTC) 20 MG tablet, Take 20 mg by mouth as needed. , Disp: ,  Rfl:    potassium chloride SA (KLOR-CON) 20 MEQ tablet, TAKE  1 TABLET EVERY DAY, Disp: 90 tablet, Rfl: 0   pravastatin (PRAVACHOL) 40 MG tablet, TAKE 1 TABLET AT BEDTIME, Disp: 90 tablet, Rfl: 0   senna (SENOKOT) 8.6 MG TABS tablet, Take 2 tablets (17.2 mg total) by mouth at bedtime as needed for mild constipation., Disp: 120 each, Rfl: 0   traMADol (ULTRAM) 50 MG tablet, Take 1 tablet (50 mg total) by mouth every 6 (six) hours as needed for moderate pain or severe pain., Disp: 20 tablet, Rfl: 0   triamcinolone (KENALOG) 0.1 %, Apply 1 application topically 2 (two) times daily., Disp: 30 g, Rfl: 0    Assessment & Plan:  Dm a1c much improved on metformin and jardiance. Is on glipizide drinks gatorade .adviced not to as this can increase bl sugars, declines sugar free one.   Ref. Range 01/24/2021 08:02 01/24/2021 08:06 01/31/2021 13:27 02/09/2021 00:00 03/29/2021 09:47  Hemoglobin A1C Latest Ref Range: 4.8 - 5.6 %     7.4 (H)  HB A1C (BAYER DCA - WAIVED) Latest Ref Range: <7.0 % 8.7 (H)       check HbA1c,  urine  microalbumin  diabetic diet plan given to pt  adviced regarding hypoglycemia and instructions given to pt today on how to prevent and treat the same if it were to occur. pt acknowledges the plan and voices understanding of the same.  exercise plan given and encouraged.   advice diabetic yearly podiatry, ophthalmology , nutritionist , dental check q 6 months,  2.  HTN / CAD s/p CABG : stable, chronic. HTN :   Continue current meds.  Medication compliance emphasised. pt advised to keep Bp logs. Pt verbalised understanding of the same. Pt to have a low salt diet . Exercise to reach a goal of at least 150 mins a week.  lifestyle modifications explained and pt understands importance of the above.  3. Pedal edema bil  Lower extremity edema. Stable, chroinc, multifactorial, seen  Dr. Ubaldo Glassing for such no further wu.  . Is on Lasix, continue per cards recs.   Problem List Items Addressed This Visit   None    No orders of the defined types were placed in this  encounter.    No orders of the defined types were placed in this encounter.    Follow up plan: No follow-ups on file.

## 2021-04-04 DIAGNOSIS — E119 Type 2 diabetes mellitus without complications: Secondary | ICD-10-CM | POA: Insufficient documentation

## 2021-04-04 DIAGNOSIS — E1165 Type 2 diabetes mellitus with hyperglycemia: Secondary | ICD-10-CM | POA: Insufficient documentation

## 2021-04-17 ENCOUNTER — Ambulatory Visit (INDEPENDENT_AMBULATORY_CARE_PROVIDER_SITE_OTHER): Payer: Medicare HMO

## 2021-04-17 VITALS — Ht 70.0 in | Wt 180.0 lb

## 2021-04-17 DIAGNOSIS — Z0000 Encounter for general adult medical examination without abnormal findings: Secondary | ICD-10-CM

## 2021-04-17 NOTE — Patient Instructions (Signed)
Ryan Kirby , Thank you for taking time to come for your Medicare Wellness Visit. I appreciate your ongoing commitment to your health goals. Please review the following plan we discussed and let me know if I can assist you in the future.   Screening recommendations/referrals: Colonoscopy: decline Recommended yearly ophthalmology/optometry visit for glaucoma screening and checkup Recommended yearly dental visit for hygiene and checkup  Vaccinations: Influenza vaccine: due Pneumococcal vaccine: completed 12/01/2015 Tdap vaccine: completed 07/02/2018, due 07/02/2028 Shingles vaccine: discussed   Covid-19:  08/23/2020, 11/03/2019, 10/13/2019  Advanced directives: Advance directive discussed with you today.   Conditions/risks identified: none  Next appointment: Follow up in one year for your annual wellness visit.   Preventive Care 9 Years and Older, Male Preventive care refers to lifestyle choices and visits with your health care provider that can promote health and wellness. What does preventive care include? A yearly physical exam. This is also called an annual well check. Dental exams once or twice a year. Routine eye exams. Ask your health care provider how often you should have your eyes checked. Personal lifestyle choices, including: Daily care of your teeth and gums. Regular physical activity. Eating a healthy diet. Avoiding tobacco and drug use. Limiting alcohol use. Practicing safe sex. Taking low doses of aspirin every day. Taking vitamin and mineral supplements as recommended by your health care provider. What happens during an annual well check? The services and screenings done by your health care provider during your annual well check will depend on your age, overall health, lifestyle risk factors, and family history of disease. Counseling  Your health care provider may ask you questions about your: Alcohol use. Tobacco use. Drug use. Emotional well-being. Home and  relationship well-being. Sexual activity. Eating habits. History of falls. Memory and ability to understand (cognition). Work and work Statistician. Screening  You may have the following tests or measurements: Height, weight, and BMI. Blood pressure. Lipid and cholesterol levels. These may be checked every 5 years, or more frequently if you are over 43 years old. Skin check. Lung cancer screening. You may have this screening every year starting at age 37 if you have a 30-pack-year history of smoking and currently smoke or have quit within the past 15 years. Fecal occult blood test (FOBT) of the stool. You may have this test every year starting at age 67. Flexible sigmoidoscopy or colonoscopy. You may have a sigmoidoscopy every 5 years or a colonoscopy every 10 years starting at age 46. Prostate cancer screening. Recommendations will vary depending on your family history and other risks. Hepatitis C blood test. Hepatitis B blood test. Sexually transmitted disease (STD) testing. Diabetes screening. This is done by checking your blood sugar (glucose) after you have not eaten for a while (fasting). You may have this done every 1-3 years. Abdominal aortic aneurysm (AAA) screening. You may need this if you are a current or former smoker. Osteoporosis. You may be screened starting at age 26 if you are at high risk. Talk with your health care provider about your test results, treatment options, and if necessary, the need for more tests. Vaccines  Your health care provider may recommend certain vaccines, such as: Influenza vaccine. This is recommended every year. Tetanus, diphtheria, and acellular pertussis (Tdap, Td) vaccine. You may need a Td booster every 10 years. Zoster vaccine. You may need this after age 71. Pneumococcal 13-valent conjugate (PCV13) vaccine. One dose is recommended after age 53. Pneumococcal polysaccharide (PPSV23) vaccine. One dose is recommended after age  64. Talk to your  health care provider about which screenings and vaccines you need and how often you need them. This information is not intended to replace advice given to you by your health care provider. Make sure you discuss any questions you have with your health care provider. Document Released: 09/16/2015 Document Revised: 05/09/2016 Document Reviewed: 06/21/2015 Elsevier Interactive Patient Education  2017 [place] Prevention in the Home Falls can cause injuries. They can happen to people of all ages. There are many things you can do to make your home safe and to help prevent falls. What can I do on the outside of my home? Regularly fix the edges of walkways and driveways and fix any cracks. Remove anything that might make you trip as you walk through a door, such as a raised step or threshold. Trim any bushes or trees on the path to your home. Use bright outdoor lighting. Clear any walking paths of anything that might make someone trip, such as rocks or tools. Regularly check to see if handrails are loose or broken. Make sure that both sides of any steps have handrails. Any raised decks and porches should have guardrails on the edges. Have any leaves, snow, or ice cleared regularly. Use sand or salt on walking paths during winter. Clean up any spills in your garage right away. This includes oil or grease spills. What can I do in the bathroom? Use night lights. Install grab bars by the toilet and in the tub and shower. Do not use towel bars as grab bars. Use non-skid mats or decals in the tub or shower. If you need to sit down in the shower, use a plastic, non-slip stool. Keep the floor dry. Clean up any water that spills on the floor as soon as it happens. Remove soap buildup in the tub or shower regularly. Attach bath mats securely with double-sided non-slip rug tape. Do not have throw rugs and other things on the floor that can make you trip. What can I do in the bedroom? Use night  lights. Make sure that you have a light by your bed that is easy to reach. Do not use any sheets or blankets that are too big for your bed. They should not hang down onto the floor. Have a firm chair that has side arms. You can use this for support while you get dressed. Do not have throw rugs and other things on the floor that can make you trip. What can I do in the kitchen? Clean up any spills right away. Avoid walking on wet floors. Keep items that you use a lot in easy-to-reach places. If you need to reach something above you, use a strong step stool that has a grab bar. Keep electrical cords out of the way. Do not use floor polish or wax that makes floors slippery. If you must use wax, use non-skid floor wax. Do not have throw rugs and other things on the floor that can make you trip. What can I do with my stairs? Do not leave any items on the stairs. Make sure that there are handrails on both sides of the stairs and use them. Fix handrails that are broken or loose. Make sure that handrails are as long as the stairways. Check any carpeting to make sure that it is firmly attached to the stairs. Fix any carpet that is loose or worn. Avoid having throw rugs at the top or bottom of the stairs. If you do have  throw rugs, attach them to the floor with carpet tape. Make sure that you have a light switch at the top of the stairs and the bottom of the stairs. If you do not have them, ask someone to add them for you. What else can I do to help prevent falls? Wear shoes that: Do not have high heels. Have rubber bottoms. Are comfortable and fit you well. Are closed at the toe. Do not wear sandals. If you use a stepladder: Make sure that it is fully opened. Do not climb a closed stepladder. Make sure that both sides of the stepladder are locked into place. Ask someone to hold it for you, if possible. Clearly mark and make sure that you can see: Any grab bars or handrails. First and last  steps. Where the edge of each step is. Use tools that help you move around (mobility aids) if they are needed. These include: Canes. Walkers. Scooters. Crutches. Turn on the lights when you go into a dark area. Replace any light bulbs as soon as they burn out. Set up your furniture so you have a clear path. Avoid moving your furniture around. If any of your floors are uneven, fix them. If there are any pets around you, be aware of where they are. Review your medicines with your doctor. Some medicines can make you feel dizzy. This can increase your chance of falling. Ask your doctor what other things that you can do to help prevent falls. This information is not intended to replace advice given to you by your health care provider. Make sure you discuss any questions you have with your health care provider. Document Released: 06/16/2009 Document Revised: 01/26/2016 Document Reviewed: 09/24/2014 Elsevier Interactive Patient Education  2017 [employer].

## 2021-04-17 NOTE — Progress Notes (Signed)
I connected with Ryan Kirby today by telephone and verified that I am speaking with the correct person using two identifiers. Location patient: home Location provider: work Persons participating in the virtual visit: Moir Baney, Glenna Durand LPN.   I discussed the limitations, risks, security and privacy concerns of performing an evaluation and management service by telephone and the availability of in person appointments. I also discussed with the patient that there may be a patient responsible charge related to this service. The patient expressed understanding and verbally consented to this telephonic visit.    Interactive audio and video telecommunications were attempted between this provider and patient, however failed, due to patient having technical difficulties OR patient did not have access to video capability.  We continued and completed visit with audio only.     Vital signs may be patient reported or missing.  Subjective:   Ryan Kirby is a 80 y.o. male who presents for Medicare Annual/Subsequent preventive examination.  Review of Systems     Cardiac Risk Factors include: advanced age (>58mn, >>37women);diabetes mellitus;hypertension;male gender;sedentary lifestyle     Objective:    Today's Vitals   04/17/21 0812  Weight: 180 lb (81.6 kg)  Height: '5\' 10"'$  (1.778 m)   Body mass index is 25.83 kg/m.  Advanced Directives 04/17/2021 05/26/2020 04/15/2020 02/11/2019 11/03/2018 10/29/2018 12/16/2017  Does Patient Have a Medical Advance Directive? No Yes No No No No No  Type of Advance Directive - - - - - - -  Copy of Healthcare Power of Attorney in Chart? - - - - - - -  Would patient like information on creating a medical advance directive? - - - - - - Yes (MAU/Ambulatory/Procedural Areas - Information given)    Current Medications (verified) Outpatient Encounter Medications as of 04/17/2021  Medication Sig   acetaminophen (TYLENOL) 500 MG tablet Take 500 mg by mouth  every 6 (six) hours as needed.   amLODipine (NORVASC) 10 MG tablet TAKE 1 TABLET EVERY DAY   aspirin EC 81 MG tablet Take 81 mg by mouth daily.   benazepril-hydrochlorthiazide (LOTENSIN HCT) 20-12.5 MG tablet TAKE 1 TABLET EVERY DAY   carvedilol (COREG) 25 MG tablet TAKE 2 TABLETS TWICE DAILY   cetirizine (ZYRTEC) 10 MG tablet Take 10 mg by mouth as needed.    cloNIDine (CATAPRES) 0.3 MG tablet TAKE 1 TABLET TWICE DAILY   docusate sodium (COLACE) 100 MG capsule Take 100 mg by mouth every other day.    doxazosin (CARDURA) 2 MG tablet TAKE 1 TABLET EVERY MORNING   empagliflozin (JARDIANCE) 25 MG TABS tablet Take 1 tablet (25 mg total) by mouth daily before breakfast.   furosemide (LASIX) 40 MG tablet TAKE 1 TABLET EVERY DAY   glipiZIDE (GLUCOTROL) 5 MG tablet Take 1 tablet (5 mg total) by mouth daily before breakfast.   metFORMIN (GLUCOPHAGE) 500 MG tablet TAKE 1 TABLET TWICE DAILY   omeprazole (PRILOSEC OTC) 20 MG tablet Take 20 mg by mouth as needed.    potassium chloride SA (KLOR-CON) 20 MEQ tablet TAKE 1 TABLET EVERY DAY   pravastatin (PRAVACHOL) 40 MG tablet TAKE 1 TABLET AT BEDTIME   senna (SENOKOT) 8.6 MG TABS tablet Take 2 tablets (17.2 mg total) by mouth at bedtime as needed for mild constipation.   triamcinolone (KENALOG) 0.1 % Apply 1 application topically 2 (two) times daily.   traMADol (ULTRAM) 50 MG tablet Take 1 tablet (50 mg total) by mouth every 6 (six) hours as needed for moderate pain  or severe pain. (Patient not taking: Reported on 04/17/2021)   No facility-administered encounter medications on file as of 04/17/2021.    Allergies (verified) Oxycontin [oxycodone]   History: Past Medical History:  Diagnosis Date   Allergy    Arthritis    CAD (coronary artery disease)    Chronic kidney disease    pt is not on dialysis   Diabetes mellitus without complication ([place])    GERD (gastroesophageal reflux disease)    Hyperlipidemia    Hypertension    Past Surgical History:   Procedure Laterality Date   BOWEL RESECTION  2004   CORONARY ARTERY BYPASS GRAFT     FINGER AMPUTATION Left 2011   5th   INGUINAL HERNIA REPAIR Right 06/01/2020   Procedure: HERNIA REPAIR INGUINAL ADULT, open;  Surgeon: Fredirick Maudlin, MD;  Location: [facility] ORS;  Service: General;  Laterality: Right;   KNEE ARTHROSCOPY Right 09/12/2015   Procedure: ARTHROSCOPY KNEE, LATERAL MENISECTOMY, CHONDROPLASTY;  Surgeon: Dereck Leep, MD;  Location: [facility] ORS;  Service: Orthopedics;  Laterality: Right;   WISDOM TOOTH EXTRACTION     Family History  Problem Relation Age of Onset   Arthritis Mother    Hypertension Mother    Breast cancer Mother    Arthritis Father    Hypertension Father    Colon cancer Father    Social History   Socioeconomic History   Marital status: Single    Spouse name: Not on file   Number of children: Not on file   Years of education: Not on file   Highest education level: 12th grade  Occupational History   Occupation: retired  Tobacco Use   Smoking status: Never   Smokeless tobacco: Never  Vaping Use   Vaping Use: Never used  Substance and Sexual Activity   Alcohol use: No    Alcohol/week: 0.0 standard drinks   Drug use: No   Sexual activity: Not on file  Other Topics Concern   Not on file  Social History Narrative   Not on file   Social Determinants of Health   Financial Resource Strain: Low Risk    Difficulty of Paying Living Expenses: Not hard at all  Food Insecurity: No Food Insecurity   Worried About Charity fundraiser in the Last Year: Never true   [place] in the Last Year: Never true  Transportation Needs: No Transportation Needs   Lack of Transportation (Medical): No   Lack of Transportation (Non-Medical): No  Physical Activity: Inactive   Days of Exercise per Week: 0 days   Minutes of Exercise per Session: 0 min  Stress: No Stress Concern Present   Feeling of Stress : Not at all  Social Connections: Not on file    Tobacco  Counseling Counseling given: Not Answered   Clinical Intake:  Pre-visit preparation completed: Yes  Pain : No/denies pain     Nutritional Status: BMI 25 -29 Overweight Nutritional Risks: None Diabetes: Yes  How often do you need to have someone help you when you read instructions, pamphlets, or other written materials from your doctor or pharmacy?: 1 - Never What is the last grade level you completed in school?: 12th grade  Diabetic? Yes Nutrition Risk Assessment:  Has the patient had any N/V/D within the last 2 months?  No  Does the patient have any non-healing wounds?  No  Has the patient had any unintentional weight loss or weight gain?  No   Diabetes:  Is the patient diabetic?  Yes  If diabetic, was a CBG obtained today?  No  Did the patient bring in their glucometer from home?  No  How often do you monitor your CBG's? Once weekly.   Financial Strains and Diabetes Management:  Are you having any financial strains with the device, your supplies or your medication? No .  Does the patient want to be seen by Chronic Care Management for management of their diabetes?  No  Would the patient like to be referred to a Nutritionist or for Diabetic Management?  No   Diabetic Exams:  Diabetic Eye Exam: Completed 02/09/2021 Diabetic Foot Exam: Overdue, Pt has been advised about the importance in completing this exam. Pt is scheduled for diabetic foot exam on next appointment.   Interpreter Needed?: No  Information entered by :: NAllen LPN   Activities of Daily Living In your present state of health, do you have any difficulty performing the following activities: 04/17/2021 05/26/2020  Hearing? N N  Vision? N N  Difficulty concentrating or making decisions? N N  Walking or climbing stairs? N N  Dressing or bathing? N N  Doing errands, shopping? N N  Preparing Food and eating ? N -  Using the Toilet? N -  In the past six months, have you accidently leaked urine? Y -  Do  you have problems with loss of bowel control? N -  Managing your Medications? N -  Managing your Finances? N -  Housekeeping or managing your Housekeeping? N -  Some recent data might be hidden    Patient Care Team: Charlynne Cousins, MD as PCP - General Guadalupe Maple, MD as PCP - Family Medicine (Family Medicine) Garrel Ridgel, [place] as Consulting Physician (Podiatry) Vladimir Faster, [facility] as Pharmacist (Pharmacist)  Indicate any recent Medical Services you may have received from other than [facility] providers in the past year (date may be approximate).     Assessment:   This is a routine wellness examination for [place].  Hearing/Vision screen Vision Screening - Comments:: Regular eye exams, [facility]  Dietary issues and exercise activities discussed: Current Exercise Habits: The patient does not participate in regular exercise at present   Goals Addressed             This Visit's Progress    Patient Stated       04/17/2021, stay healthy       Depression Screen PHQ 2/9 Scores 04/17/2021 04/03/2021 01/31/2021 11/30/2020 04/15/2020 02/17/2020 02/11/2019  PHQ - 2 Score 0 0 0 0 0 1 1  PHQ- 9 Score - - - - - 2 3    Fall Risk Fall Risk  04/17/2021 04/03/2021 01/31/2021 12/19/2020 11/30/2020  Falls in the past year? 0 0 0 0 0  Number falls in past yr: - 0 0 - -  Injury with Fall? - 0 0 - -  Risk for fall due to : Medication side effect No Fall Risks No Fall Risks - -  Risk for fall due to: Comment - - - - -  Follow up Falls evaluation completed;Education provided;Falls prevention discussed Falls evaluation completed Falls evaluation completed - -    FALL RISK PREVENTION PERTAINING TO THE HOME:  Any stairs in or around the home? Yes  If so, are there any without handrails? No  Home free of loose throw rugs in walkways, pet beds, electrical cords, etc? Yes  Adequate lighting in your home to reduce risk of falls? Yes   ASSISTIVE DEVICES UTILIZED  TO PREVENT FALLS:  Life alert? No   Use of a cane, walker or w/c? No  Grab bars in the bathroom? Yes  Shower chair or bench in shower? No  Elevated toilet seat or a handicapped toilet? No   TIMED UP AND GO:  Was the test performed? No .      Cognitive Function:     6CIT Screen 04/17/2021 04/15/2020 02/11/2019 12/16/2017  What Year? 0 points 0 points 0 points 0 points  What month? 0 points 0 points 0 points 0 points  What time? 0 points 0 points 0 points 0 points  Count back from 20 0 points 0 points 0 points 0 points  Months in reverse 0 points 0 points 0 points 0 points  Repeat phrase 0 points 0 points 0 points 0 points  Total Score 0 0 0 0    Immunizations Immunization History  Administered Date(s) Administered   Fluad Quad(high Dose 65+) 07/27/2019, 08/18/2020   Influenza, High Dose Seasonal PF 07/17/2016, 06/24/2017, 07/02/2018   Influenza,inj,Quad PF,6+ Mos 06/02/2015   PFIZER(Purple Top)SARS-COV-2 Vaccination 10/13/2019, 11/03/2019, 08/23/2020   Pneumococcal Conjugate-13 12/01/2015   Pneumococcal-Unspecified 07/01/2007   Td 05/25/2008, 07/02/2018    TDAP status: Up to date  Flu Vaccine status: Due, Education has been provided regarding the importance of this vaccine. Advised may receive this vaccine at local pharmacy or Health Dept. Aware to provide a copy of the vaccination record if obtained from local pharmacy or Health Dept. Verbalized acceptance and understanding.  Pneumococcal vaccine status: Up to date  Covid-19 vaccine status: Completed vaccines  Qualifies for Shingles Vaccine? Yes   Zostavax completed No   Shingrix Completed?: No.    Education has been provided regarding the importance of this vaccine. Patient has been advised to call insurance company to determine out of pocket expense if they have not yet received this vaccine. Advised may also receive vaccine at local pharmacy or Health Dept. Verbalized acceptance and understanding.  Screening Tests Health Maintenance  Topic Date Due    Zoster Vaccines- Shingrix (1 of 2) Never done   COVID-19 Vaccine (4 - Booster for Pfizer series) 11/21/2020   FOOT EXAM  02/16/2021   INFLUENZA VACCINE  04/03/2021   COLONOSCOPY (Pts 45-9yr Insurance coverage will need to be confirmed)  04/17/2022 (Originally 08/21/2018)   HEMOGLOBIN A1C  09/29/2021   OPHTHALMOLOGY EXAM  02/09/2022   TETANUS/TDAP  07/02/2028   Hepatitis C Screening  Completed   PNA vac Low Risk Adult  Completed   HPV VACCINES  Aged Out    Health Maintenance  Health Maintenance Due  Topic Date Due   Zoster Vaccines- Shingrix (1 of 2) Never done   COVID-19 Vaccine (4 - Booster for Pfizer series) 11/21/2020   FOOT EXAM  02/16/2021   INFLUENZA VACCINE  04/03/2021    Colorectal cancer screening: decline   Lung Cancer Screening: (Low Dose CT Chest recommended if Age 79-80years, 30 pack-year currently smoking OR have quit w/in 15years.) does not qualify.   Lung Cancer Screening Referral: no  Additional Screening:  Hepatitis C Screening: does qualify; Completed 02/17/2020  Vision Screening: Recommended annual ophthalmology exams for early detection of glaucoma and other disorders of the eye. Is the patient up to date with their annual eye exam?  Yes  Who is the provider or what is the name of the office in which the patient attends annual eye exams[facility] is not [facility] with a provider, would they like to  be referred to a provider to establish care? No .   Dental Screening: Recommended annual dental exams for proper oral hygiene  Community Resource Referral / Chronic Care Management: CRR required this visit?  No   CCM required this visit?  No      Plan:     I have personally reviewed and noted the following in the patient's chart:   Medical and social history Use of alcohol, tobacco or illicit drugs  Current medications and supplements including opioid prescriptions. Patient is not currently taking opioid  prescriptions. Functional ability and status Nutritional status Physical activity Advanced directives List of other physicians Hospitalizations, surgeries, and ER visits in previous 12 months Vitals Screenings to include cognitive, depression, and falls Referrals and appointments  In addition, I have reviewed and discussed with patient certain preventive protocols, quality metrics, and best practice recommendations. A written personalized care plan for preventive services as well as general preventive health recommendations were provided to patient.     Kellie Simmering, LPN   [id number]   Nurse Notes:

## 2021-06-08 ENCOUNTER — Telehealth: Payer: Self-pay

## 2021-06-08 NOTE — Chronic Care Management (AMB) (Unsigned)
Chronic Care Management Pharmacy Assistant   Name: MILES LEYDA  MRN: [id number] DOB: 21-Jul-1941  Ryan Kirby is an 80 y.o. year old male who presents for his follow-up CCM visit with the clinical pharmacist.  Reason for Encounter: Disease State BP   Recent office visits:  04/03/21 Charlynne Cousins MD PCP- pt was seen for HTN. No labs or medication changes. No follow up noted.  01/31/21 Jerrell Mylar NP- pt was seen for Hypokalemia. No labs or Medication changes. Follow up in 2 months.  Recent consult visits:  02/09/21 Loralie [facility]- pt was seen for Age-related nuclear cataract. Unable to view notes.  01/19/21 Chrissie [facility]a [facility]- pt was seen for Atherosclerosis. Unable to view notes.  Hospital visits:  None in previous 6 months  Medications: Outpatient Encounter Medications as of 06/08/2021  Medication Sig   acetaminophen (TYLENOL) 500 MG tablet Take 500 mg by mouth every 6 (six) hours as needed.   amLODipine (NORVASC) 10 MG tablet TAKE 1 TABLET EVERY DAY   aspirin EC 81 MG tablet Take 81 mg by mouth daily.   benazepril-hydrochlorthiazide (LOTENSIN HCT) 20-12.5 MG tablet TAKE 1 TABLET EVERY DAY   carvedilol (COREG) 25 MG tablet TAKE 2 TABLETS TWICE DAILY   cetirizine (ZYRTEC) 10 MG tablet Take 10 mg by mouth as needed.    cloNIDine (CATAPRES) 0.3 MG tablet TAKE 1 TABLET TWICE DAILY   docusate sodium (COLACE) 100 MG capsule Take 100 mg by mouth every other day.    doxazosin (CARDURA) 2 MG tablet TAKE 1 TABLET EVERY MORNING   empagliflozin (JARDIANCE) 25 MG TABS tablet Take 1 tablet (25 mg total) by mouth daily before breakfast.   furosemide (LASIX) 40 MG tablet TAKE 1 TABLET EVERY DAY   glipiZIDE (GLUCOTROL) 5 MG tablet Take 1 tablet (5 mg total) by mouth daily before breakfast.   metFORMIN (GLUCOPHAGE) 500 MG tablet TAKE 1 TABLET TWICE DAILY   omeprazole (PRILOSEC OTC) 20 MG tablet Take 20 mg by mouth as needed.    potassium chloride SA (KLOR-CON) 20 MEQ  tablet TAKE 1 TABLET EVERY DAY   pravastatin (PRAVACHOL) 40 MG tablet TAKE 1 TABLET AT BEDTIME   senna (SENOKOT) 8.6 MG TABS tablet Take 2 tablets (17.2 mg total) by mouth at bedtime as needed for mild constipation.   triamcinolone (KENALOG) 0.1 % Apply 1 application topically 2 (two) times daily.   No facility-administered encounter medications on file as of 06/08/2021.   Reviewed chart prior to disease state call. Spoke with patient regarding BP  Recent Office Vitals: BP Readings from Last 3 Encounters:  04/03/21 135/67  01/31/21 129/71  12/19/20 140/77   Pulse Readings from Last 3 Encounters:  04/03/21 65  01/31/21 79  12/19/20 74    Wt Readings from Last 3 Encounters:  04/17/21 180 lb (81.6 kg)  04/03/21 180 lb (81.6 kg)  01/31/21 179 lb 9.6 oz (81.5 kg)     Kidney Function Lab Results  Component Value Date/Time   CREATININE 1.23 03/29/2021 09:47 AM   CREATININE 1.39 (H) 01/31/2021 01:27 PM   GFRNONAA 54 (L) 08/18/2020 09:19 AM   GFRAA 62 08/18/2020 09:19 AM    BMP Latest Ref Rng & Units 03/29/2021 01/31/2021 01/24/2021  Glucose 65 - 99 mg/dL 132(H) 308(H) 173(H)  BUN 8 - 27 mg/dL 14 15 16   Creatinine 0.76 - 1.27 mg/dL 1.23 1.39(H) 1.31(H)  BUN/Creat Ratio 10 - 24 11 11 12   Sodium 134 - 144 mmol/L 140  140 144  Potassium 3.5 - 5.2 mmol/L 3.7 3.5 3.2(L)  Chloride 96 - 106 mmol/L 101 98 102  CO2 20 - 29 mmol/L 24 24 24   Calcium 8.6 - 10.2 mg/dL 10.2 10.2 10.3(H)    Recent Relevant Labs: Lab Results  Component Value Date/Time   HGBA1C 7.4 (H) 03/29/2021 09:47 AM   HGBA1C 8.7 (H) 01/24/2021 08:02 AM   HGBA1C 8.0 (H) 12/12/2020 08:15 AM   HGBA1C 7.4 (H) 11/30/2020 11:54 AM   MICROALBUR 10 12/12/2020 08:14 AM   MICROALBUR 10 08/18/2020 09:10 AM    Kidney Function Lab Results  Component Value Date/Time   CREATININE 1.23 03/29/2021 09:47 AM   CREATININE 1.39 (H) 01/31/2021 01:27 PM   GFRNONAA 54 (L) 08/18/2020 09:19 AM   GFRAA 62 08/18/2020 09:19 AM    Current  antihyperglycemic regimen:  ***  What recent interventions/DTPs have been made to improve glycemic control:  ***  Have there been any recent hospitalizations or ED visits since last visit with CPP? No   Patient {reports/denies:24182} hypoglycemic symptoms, including {Hypoglycemic Symptoms:3049003}   Patient {reports/denies:24182} hyperglycemic symptoms, including {symptoms; hyperglycemia:17903}   How often are you checking your blood sugar? {BG Testing frequency:23922}  What are your blood sugars ranging?  Fasting: *** Before meals: *** After meals: *** Bedtime: ***   During the week, how often does your blood glucose drop below 70? {LowBGfrequency:24142}  Are you checking your feet daily/regularly?   Adherence Review: Is the patient currently on a STATIN medication? Yes Is the patient currently on ACE/ARB medication? No Does the patient have >5 day gap between last estimated fill dates? No     Star Rating Drugs:  pravastatin (PRAVACHOL) 40 MG tablet last fill 10/04/20 90 DS benazepril-hydrochlorthiazide 20-12.5 MG tablet last fill 10/04/20 90 DS  Lexa Clinical Pharmacist Assistant [phone]

## 2021-06-23 ENCOUNTER — Encounter: Payer: Self-pay | Admitting: General Surgery

## 2021-07-04 ENCOUNTER — Other Ambulatory Visit: Payer: Self-pay

## 2021-07-04 ENCOUNTER — Ambulatory Visit (INDEPENDENT_AMBULATORY_CARE_PROVIDER_SITE_OTHER): Payer: Medicare HMO | Admitting: Internal Medicine

## 2021-07-04 ENCOUNTER — Encounter: Payer: Self-pay | Admitting: Internal Medicine

## 2021-07-04 VITALS — BP 134/68 | HR 71 | Temp 98.9°F | Ht 69.9 in | Wt 180.0 lb

## 2021-07-04 DIAGNOSIS — I2510 Atherosclerotic heart disease of native coronary artery without angina pectoris: Secondary | ICD-10-CM | POA: Diagnosis not present

## 2021-07-04 DIAGNOSIS — I2583 Coronary atherosclerosis due to lipid rich plaque: Secondary | ICD-10-CM

## 2021-07-04 DIAGNOSIS — Z23 Encounter for immunization: Secondary | ICD-10-CM | POA: Insufficient documentation

## 2021-07-04 DIAGNOSIS — K219 Gastro-esophageal reflux disease without esophagitis: Secondary | ICD-10-CM | POA: Diagnosis not present

## 2021-07-04 DIAGNOSIS — E119 Type 2 diabetes mellitus without complications: Secondary | ICD-10-CM

## 2021-07-04 LAB — BAYER DCA HB A1C WAIVED: HB A1C (BAYER DCA - WAIVED): 6.5 % — ABNORMAL HIGH (ref 4.8–5.6)

## 2021-07-04 NOTE — Progress Notes (Signed)
BP 134/68   Pulse 71   Temp 98.9 F (37.2 C) (Oral)   Ht 5' 9.9" (1.775 m)   Wt 180 lb (81.6 kg)   SpO2 98%   BMI 25.90 kg/m    Subjective:    Patient ID: Ryan Kirby, male    DOB: Apr 19, 1941, 80 y.o.   MRN: [id number]  Chief Complaint  Patient presents with  . Hyperlipidemia  . Hypertension    HPI: Ryan Kirby is a 80 y.o. male  [name] was 51 -- now 89  Has knee pain -   Hyperlipidemia This is a chronic problem.  Hypertension This is a chronic problem. The current episode started more than 1 year ago. The problem is unchanged. The problem is controlled. Pertinent negatives include no anxiety, blurred vision, malaise/fatigue, neck pain, peripheral edema or PND.  Knee Pain  The incident occurred more than 1 week ago (has some surgery by ortho). The pain is present in the left knee. The pain is at a severity of 6/10. The pain has been Fluctuating since onset. Pertinent negatives include no inability to bear weight, loss of motion, loss of sensation, muscle weakness, numbness or tingling.   Chief Complaint  Patient presents with  . Hyperlipidemia  . Hypertension    Relevant past medical, surgical, family and social history reviewed and updated as indicated. Interim medical history since our last visit reviewed. Allergies and medications reviewed and updated.  Review of Systems  Constitutional:  Negative for malaise/fatigue.  Eyes:  Negative for blurred vision.  Cardiovascular:  Negative for PND.  Musculoskeletal:  Negative for neck pain.  Neurological:  Negative for tingling and numbness.   Per HPI unless specifically indicated above     Objective:    BP 134/68   Pulse 71   Temp 98.9 F (37.2 C) (Oral)   Ht 5' 9.9" (1.775 m)   Wt 180 lb (81.6 kg)   SpO2 98%   BMI 25.90 kg/m   Wt Readings from Last 3 Encounters:  07/04/21 180 lb (81.6 kg)  04/17/21 180 lb (81.6 kg)  04/03/21 180 lb (81.6 kg)    Physical Exam Vitals and nursing note reviewed.   Constitutional:      General: He is not in acute distress.    Appearance: Normal appearance. He is not ill-appearing or diaphoretic.  HENT:     Head: Normocephalic and atraumatic.     Right Ear: Tympanic membrane and external ear normal. There is no impacted cerumen.     Left Ear: External ear normal.     Nose: No congestion or rhinorrhea.     Mouth/Throat:     Pharynx: No oropharyngeal exudate or posterior oropharyngeal erythema.  Eyes:     Conjunctiva/sclera: Conjunctivae normal.     Pupils: Pupils are equal, round, and reactive to light.  Cardiovascular:     Rate and Rhythm: Normal rate and regular rhythm.     Heart sounds: No murmur heard.   No friction rub. No gallop.  Pulmonary:     Effort: No respiratory distress.     Breath sounds: No stridor. No wheezing or rhonchi.  Chest:     Chest wall: No tenderness.  Abdominal:     General: Abdomen is flat. Bowel sounds are normal.     Palpations: Abdomen is soft. There is no mass.     Tenderness: There is no abdominal tenderness.  Musculoskeletal:     Cervical back: Normal range of motion and neck supple. No  rigidity or tenderness.     Right lower leg: Edema present.     Left lower leg: Edema present.  Skin:    General: Skin is warm and dry.  Neurological:     Mental Status: He is alert.  Psychiatric:        Mood and Affect: Mood normal.        Behavior: Behavior normal.        Thought Content: Thought content normal.        Judgment: Judgment normal.    Results for orders placed or performed in visit on 03/29/21  HgB A1c  Result Value Ref Range   Hgb A1c MFr Bld 7.4 (H) 4.8 - 5.6 %   Est. average glucose Bld gHb Est-mCnc 166 mg/dL  CBC with Differential  Result Value Ref Range   WBC 6.6 3.4 - 10.8 x10E3/uL   RBC 4.71 4.14 - 5.80 x10E6/uL   Hemoglobin 14.7 13.0 - 17.7 g/dL   Hematocrit 43.0 37.5 - 51.0 %   MCV 91 79 - 97 fL   MCH 31.2 26.6 - 33.0 pg   MCHC 34.2 31.5 - 35.7 g/dL   RDW 12.2 11.6 - 15.4 %    Platelets 172 150 - 450 x10E3/uL   Neutrophils 72 Not Estab. %   Lymphs 16 Not Estab. %   Monocytes 5 Not Estab. %   Eos 6 Not Estab. %   Basos 1 Not Estab. %   Neutrophils Absolute 4.8 1.4 - 7.0 x10E3/uL   Lymphocytes Absolute 1.0 0.7 - 3.1 x10E3/uL   Monocytes Absolute 0.4 0.1 - 0.9 x10E3/uL   EOS (ABSOLUTE) 0.4 0.0 - 0.4 x10E3/uL   Basophils Absolute 0.1 0.0 - 0.2 x10E3/uL   Immature Granulocytes 0 Not Estab. %   Immature Grans (Abs) 0.0 0.0 - 0.1 Y65K3/TW  Basic Metabolic Panel (BMET)  Result Value Ref Range   Glucose 132 (H) 65 - 99 mg/dL   BUN 14 8 - 27 mg/dL   Creatinine, Ser 1.23 0.76 - 1.27 mg/dL   eGFR 60 >59 mL/min/1.73   BUN/Creatinine Ratio 11 10 - 24   Sodium 140 134 - 144 mmol/L   Potassium 3.7 3.5 - 5.2 mmol/L   Chloride 101 96 - 106 mmol/L   CO2 24 20 - 29 mmol/L   Calcium 10.2 8.6 - 10.2 mg/dL        Current Outpatient Medications:  .  ACCU-CHEK GUIDE test strip, , Disp: , Rfl:  .  Accu-Chek Softclix Lancets lancets, , Disp: , Rfl:  .  acetaminophen (TYLENOL) 500 MG tablet, Take 500 mg by mouth every 6 (six) hours as needed., Disp: , Rfl:  .  amLODipine (NORVASC) 10 MG tablet, TAKE 1 TABLET EVERY DAY, Disp: 90 tablet, Rfl: 0 .  benazepril-hydrochlorthiazide (LOTENSIN HCT) 20-12.5 MG tablet, TAKE 1 TABLET EVERY DAY, Disp: 90 tablet, Rfl: 0 .  Blood Glucose Monitoring Suppl (ACCU-CHEK GUIDE ME) w/Device KIT, , Disp: , Rfl:  .  carvedilol (COREG) 25 MG tablet, TAKE 2 TABLETS TWICE DAILY, Disp: 360 tablet, Rfl: 0 .  cetirizine (ZYRTEC) 10 MG tablet, Take 10 mg by mouth as needed. , Disp: , Rfl:  .  cloNIDine (CATAPRES) 0.3 MG tablet, TAKE 1 TABLET TWICE DAILY, Disp: 180 tablet, Rfl: 0 .  docusate sodium (COLACE) 100 MG capsule, Take 100 mg by mouth every other day. , Disp: , Rfl:  .  doxazosin (CARDURA) 2 MG tablet, TAKE 1 TABLET EVERY MORNING, Disp: 90 tablet, Rfl: 0 .  empagliflozin (JARDIANCE) 25 MG TABS tablet, Take 1 tablet (25 mg total) by mouth daily  before breakfast., Disp: 90 tablet, Rfl: 1 .  furosemide (LASIX) 40 MG tablet, TAKE 1 TABLET EVERY DAY, Disp: 90 tablet, Rfl: 0 .  glipiZIDE (GLUCOTROL) 5 MG tablet, Take 1 tablet (5 mg total) by mouth daily before breakfast., Disp: 90 tablet, Rfl: 1 .  metFORMIN (GLUCOPHAGE) 500 MG tablet, TAKE 1 TABLET TWICE DAILY, Disp: 180 tablet, Rfl: 0 .  omeprazole (PRILOSEC OTC) 20 MG tablet, Take 20 mg by mouth as needed. , Disp: , Rfl:  .  potassium chloride SA (KLOR-CON) 20 MEQ tablet, TAKE 1 TABLET EVERY DAY, Disp: 90 tablet, Rfl: 0 .  pravastatin (PRAVACHOL) 40 MG tablet, TAKE 1 TABLET AT BEDTIME, Disp: 90 tablet, Rfl: 0 .  senna (SENOKOT) 8.6 MG TABS tablet, Take 2 tablets (17.2 mg total) by mouth at bedtime as needed for mild constipation., Disp: 120 each, Rfl: 0 .  triamcinolone (KENALOG) 0.1 %, Apply 1 application topically 2 (two) times daily., Disp: 30 g, Rfl: 0    Assessment & Plan:  Dm is on jardiance 25 mg, glipizide and metformin for such   Ref. Range 03/29/2021 09:47  Hemoglobin A1C Latest Ref Range: 4.8 - 5.6 % 7.4 (H)   check HbA1c,  urine  microalbumin  diabetic diet plan given to pt  adviced regarding hypoglycemia and instructions given to pt today on how to prevent and treat the same if it were to occur. pt acknowledges the plan and voices understanding of the same.  exercise plan given and encouraged.   advice diabetic yearly podiatry, ophthalmology , nutritionist , dental check q 6 months,   2. Left knee pain will need to fu with ortho seen them in the past.  Fu and mx per them. Dr. Jalene Mullet @ [facility].   3. HLD On pravachol  recheck FLP, check LFT's work on diet, SE of meds explained to pt. low fat and high fiber diet explained to pt. Chloride 101 96 - 106 mmol/L     CO2 24 20 - 29 mmol/L    Calcium 10.2 8.6 - 10.2 mg/dL    4.. Pedal edema bil  Lower extremity edema. Stable, chroinc, multifactorial, seen  Dr. Ubaldo Glassing for such no further wu.  . Is on Lasix, continue per  cards recs.   Problem List Items Addressed This Visit   None Visit Diagnoses     Need for influenza vaccination    -  Primary   Relevant Orders   Flu Vaccine QUAD High Dose(Fluad)   Need for pneumococcal vaccination       Relevant Orders   Pneumococcal polysaccharide vaccine 23-valent greater than or equal to 2yo subcutaneous/IM (Completed)        Orders Placed This Encounter  Procedures  . Flu Vaccine QUAD High Dose(Fluad)  . Pneumococcal polysaccharide vaccine 23-valent greater than or equal to 2yo subcutaneous/IM     No orders of the defined types were placed in this encounter.    Follow up plan: No follow-ups on file.

## 2021-07-05 ENCOUNTER — Telehealth: Payer: Self-pay | Admitting: Internal Medicine

## 2021-07-05 NOTE — Telephone Encounter (Signed)
Pt states he would like to switch providers because he feels they do not care about pt being in the donut hole and getting cheaper medications. I spoke with the provider and was told that they sent information to pharmacist yesterday so that pt can get assistance. I told pt this information. Pt still wants to change providers.

## 2021-07-07 NOTE — Telephone Encounter (Signed)
Danielle calling from [employer] is returning Jacob's call. She states per her protocol she can only speak to [place] when he is working a [employer]. CB- [phone]

## 2021-07-17 ENCOUNTER — Ambulatory Visit: Payer: Medicare HMO | Admitting: Internal Medicine

## 2021-07-17 ENCOUNTER — Ambulatory Visit (INDEPENDENT_AMBULATORY_CARE_PROVIDER_SITE_OTHER): Payer: Medicare HMO | Admitting: Nurse Practitioner

## 2021-07-17 ENCOUNTER — Encounter: Payer: Self-pay | Admitting: Nurse Practitioner

## 2021-07-17 ENCOUNTER — Other Ambulatory Visit: Payer: Self-pay

## 2021-07-17 VITALS — BP 163/69 | HR 87 | Temp 98.3°F | Wt 176.8 lb

## 2021-07-17 DIAGNOSIS — J069 Acute upper respiratory infection, unspecified: Secondary | ICD-10-CM

## 2021-07-17 DIAGNOSIS — R051 Acute cough: Secondary | ICD-10-CM | POA: Diagnosis not present

## 2021-07-17 LAB — VERITOR FLU A/B WAIVED
Influenza A: NEGATIVE
Influenza B: NEGATIVE

## 2021-07-17 MED ORDER — BENZONATATE 100 MG PO CAPS: 100.0000 mg | ORAL_CAPSULE | Freq: Two times a day (BID) | ORAL | 0 refills | Status: DC | PRN

## 2021-07-17 MED ORDER — FLUTICASONE PROPIONATE 50 MCG/ACT NA SUSP: 2.0000 | Freq: Every day | NASAL | 6 refills | Status: DC

## 2021-07-17 NOTE — Patient Instructions (Addendum)
Gargle with warm salt water a few times a day Make sure you are drinking plenty of fluid  Start flonase daily Take tessalon as needed for cough (up to 3 times a day) If you are not feeling better by Friday, call the office

## 2021-07-17 NOTE — Progress Notes (Signed)
Acute Office Visit  Subjective:    Patient ID: Ryan Kirby, male    DOB: 12-29-1940, 80 y.o.   MRN: [id number]  Chief Complaint  Patient presents with   URI    Pt states he has been having a cough, runny nose, congestion, and fatigue since Saturday     HPI Patient is in today for runny nose and cough since Saturday  UPPER RESPIRATORY TRACT INFECTION  Worst symptom: cough Fever: no Cough: yes Shortness of breath: no Wheezing: no Chest pain: no Chest tightness: no Chest congestion: no Nasal congestion: yes Runny nose: yes Post nasal drip: yes Sneezing: yes Sore throat: yes Swollen glands: no Sinus pressure: no Headache: no Face pain: no Toothache: no Ear pain: no  Ear pressure: no  Eyes red/itching:no Eye drainage/crusting: yes  Vomiting: no Rash: no Fatigue: yes Sick contacts: no Strep contacts: no  Context: better Recurrent sinusitis: no Relief with OTC cold/cough medications: yes  Treatments attempted:  coricidin and cough syrup    Past Medical History:  Diagnosis Date   Allergy    Arthritis    CAD (coronary artery disease)    Chronic kidney disease    pt is not on dialysis   Diabetes mellitus without complication ([place])    GERD (gastroesophageal reflux disease)    Hyperlipidemia    Hypertension     Past Surgical History:  Procedure Laterality Date   BOWEL RESECTION  2004   CORONARY ARTERY BYPASS GRAFT     FINGER AMPUTATION Left 2011   5th   INGUINAL HERNIA REPAIR Right 06/01/2020   Procedure: HERNIA REPAIR INGUINAL ADULT, open;  Surgeon: Fredirick Maudlin, MD;  Location: [facility] OR[facility];  Service: General;  Laterality: Right;   KNEE ARTHROSCOPY Right 09/12/2015   Procedure: ARTHROSCOPY KNEE, LATERAL MENISECTOMY, CHONDROPLASTY;  Surgeon: Dereck Leep, MD;  Location: [facility];  Service: Orthopedics;  Laterality: Right;   WISDOM TOOTH EXTRACTION      Family History  Problem Relation Age of Onset   Arthritis Mother    Hypertension Mother     Breast cancer Mother    Arthritis Father    Hypertension Father    Colon cancer Father     Social History   Socioeconomic History   Marital status: Single    Spouse name: Not on file   Number of children: Not on file   Years of education: Not on file   Highest education level: 12th grade  Occupational History   Occupation: retired  Tobacco Use   Smoking status: Never   Smokeless tobacco: Never  Vaping Use   Vaping Use: Never used  Substance and Sexual Activity   Alcohol use: No    Alcohol/week: 0.0 standard drinks   Drug use: No   Sexual activity: Not on file  Other Topics Concern   Not on file  Social History Narrative   Not on file   Social Determinants of Health   Financial Resource Strain: Low Risk    Difficulty of Paying Living Expenses: Not hard at all  Food Insecurity: No Food Insecurity   Worried About Charity fundraiser in the Last Year: Never true   Pettibone in the Last Year: Never true  Transportation Needs: No Transportation Needs   Lack of Transportation (Medical): No   Lack of Transportation (Non-Medical): No  Physical Activity: Inactive   Days of Exercise per Week: 0 days   Minutes of Exercise per Session: 0 min  Stress: No Stress  Concern Present   Feeling of Stress : Not at all  Social Connections: Not on file  Intimate Partner Violence: Not on file    Outpatient Medications Prior to Visit  Medication Sig Dispense Refill   ACCU-CHEK GUIDE test strip      Accu-Chek Softclix Lancets lancets      acetaminophen (TYLENOL) 500 MG tablet Take 500 mg by mouth every 6 (six) hours as needed.     amLODipine (NORVASC) 10 MG tablet TAKE 1 TABLET EVERY DAY 90 tablet 0   benazepril-hydrochlorthiazide (LOTENSIN HCT) 20-12.5 MG tablet TAKE 1 TABLET EVERY DAY 90 tablet 0   Blood Glucose Monitoring Suppl (ACCU-CHEK GUIDE ME) w/Device KIT      carvedilol (COREG) 25 MG tablet TAKE 2 TABLETS TWICE DAILY 360 tablet 0   cetirizine (ZYRTEC) 10 MG tablet Take  10 mg by mouth as needed.      cloNIDine (CATAPRES) 0.3 MG tablet TAKE 1 TABLET TWICE DAILY 180 tablet 0   docusate sodium (COLACE) 100 MG capsule Take 100 mg by mouth every other day.      doxazosin (CARDURA) 2 MG tablet TAKE 1 TABLET EVERY MORNING 90 tablet 0   empagliflozin (JARDIANCE) 25 MG TABS tablet Take 1 tablet (25 mg total) by mouth daily before breakfast. 90 tablet 1   furosemide (LASIX) 40 MG tablet TAKE 1 TABLET EVERY DAY 90 tablet 0   glipiZIDE (GLUCOTROL) 5 MG tablet Take 1 tablet (5 mg total) by mouth daily before breakfast. 90 tablet 1   metFORMIN (GLUCOPHAGE) 500 MG tablet TAKE 1 TABLET TWICE DAILY 180 tablet 0   omeprazole (PRILOSEC OTC) 20 MG tablet Take 20 mg by mouth as needed.      potassium chloride SA (KLOR-CON) 20 MEQ tablet TAKE 1 TABLET EVERY DAY 90 tablet 0   pravastatin (PRAVACHOL) 40 MG tablet TAKE 1 TABLET AT BEDTIME 90 tablet 0   senna (SENOKOT) 8.6 MG TABS tablet Take 2 tablets (17.2 mg total) by mouth at bedtime as needed for mild constipation. 120 each 0   triamcinolone (KENALOG) 0.1 % Apply 1 application topically 2 (two) times daily. 30 g 0   No facility-administered medications prior to visit.    Allergies  Allergen Reactions   Oxycontin [Oxycodone] Nausea And Vomiting    Review of Systems  Constitutional:  Positive for appetite change and fatigue. Negative for fever.  HENT:  Positive for congestion, postnasal drip, rhinorrhea, sneezing and sore throat. Negative for ear pain, sinus pressure and sinus pain.   Eyes:  Positive for discharge. Negative for pain and itching.  Respiratory:  Positive for cough. Negative for shortness of breath.   Cardiovascular: Negative.   Gastrointestinal: Negative.   Genitourinary: Negative.   Musculoskeletal: Negative.   Skin: Negative.   Neurological: Negative.   Psychiatric/Behavioral: Negative.        Objective:    Physical Exam Vitals and nursing note reviewed.  Constitutional:      Appearance: Normal  appearance.  HENT:     Head: Normocephalic.     Right Ear: Tympanic membrane, ear canal and external ear normal.     Left Ear: Tympanic membrane, ear canal and external ear normal.     Mouth/Throat:     Mouth: Mucous membranes are moist.     Pharynx: Posterior oropharyngeal erythema present.  Eyes:     Conjunctiva/sclera: Conjunctivae normal.  Cardiovascular:     Rate and Rhythm: Normal rate and regular rhythm.     Pulses: Normal pulses.  Heart sounds: Normal heart sounds.  Pulmonary:     Effort: Pulmonary effort is normal.     Breath sounds: Normal breath sounds.  Abdominal:     Palpations: Abdomen is soft.     Tenderness: There is no abdominal tenderness.  Musculoskeletal:     Cervical back: Normal range of motion.  Skin:    General: Skin is warm.  Neurological:     General: No focal deficit present.     Mental Status: He is alert and oriented to person, place, and time.  Psychiatric:        Mood and Affect: Mood normal.        Behavior: Behavior normal.        Thought Content: Thought content normal.        Judgment: Judgment normal.    BP (!) 163/69   Pulse 87   Temp 98.3 F (36.8 C) (Oral)   Wt 176 lb 12.8 oz (80.2 kg)   SpO2 98%   BMI 25.44 kg/m  Wt Readings from Last 3 Encounters:  07/17/21 176 lb 12.8 oz (80.2 kg)  07/04/21 180 lb (81.6 kg)  04/17/21 180 lb (81.6 kg)    Health Maintenance Due  Topic Date Due   Zoster Vaccines- Shingrix (1 of 2) Never done   COVID-19 Vaccine (4 - Booster for Pfizer series) 10/18/2020   FOOT EXAM  02/16/2021    There are no preventive care reminders to display for this patient.   Lab Results  Component Value Date   TSH 3.220 12/12/2020   Lab Results  Component Value Date   WBC 6.6 03/29/2021   HGB 14.7 03/29/2021   HCT 43.0 03/29/2021   MCV 91 03/29/2021   PLT 172 03/29/2021   Lab Results  Component Value Date   NA 140 03/29/2021   K 3.7 03/29/2021   CO2 24 03/29/2021   GLUCOSE 132 (H) 03/29/2021    BUN 14 03/29/2021   CREATININE 1.23 03/29/2021   BILITOT 0.5 01/24/2021   ALKPHOS 87 01/24/2021   AST 15 01/24/2021   ALT 12 01/24/2021   PROT 6.5 01/24/2021   ALBUMIN 4.3 01/24/2021   CALCIUM 10.2 03/29/2021   ANIONGAP 10 05/30/2020   EGFR 60 03/29/2021   Lab Results  Component Value Date   CHOL 142 12/12/2020   Lab Results  Component Value Date   HDL 53 12/12/2020   Lab Results  Component Value Date   LDLCALC 64 12/12/2020   Lab Results  Component Value Date   TRIG 145 12/12/2020   Lab Results  Component Value Date   CHOLHDL 2.7 12/12/2020   Lab Results  Component Value Date   HGBA1C 6.5 (H) 07/04/2021       Assessment & Plan:   Problem List Items Addressed This Visit   None Visit Diagnoses     Acute cough    -  Primary   Flu negative. Covid-19 test pending. Will treat for URI   Relevant Orders   Veritor Flu A/B Waived (Completed)   Novel Coronavirus, NAA ([facility])   Upper respiratory tract infection, unspecified type       Most likely viral. Rest, increase fluids. Can use flonase daily and tessalon prn. Warm salt water gargle to help sore throat. F/U if not improving.         Meds ordered this encounter  Medications   fluticasone (FLONASE) 50 MCG/ACT nasal spray    Sig: Place 2 sprays into both nostrils daily.  Dispense:  16 g    Refill:  6   benzonatate (TESSALON) 100 MG capsule    Sig: Take 1 capsule (100 mg total) by mouth 2 (two) times daily as needed for cough.    Dispense:  20 capsule    Refill:  0     Charyl Dancer, NP

## 2021-07-19 ENCOUNTER — Encounter: Payer: Self-pay | Admitting: Internal Medicine

## 2021-07-19 LAB — NOVEL CORONAVIRUS, NAA: SARS-CoV-2, NAA: NOT DETECTED

## 2021-07-19 LAB — SARS-COV-2, NAA 2 DAY TAT

## 2021-08-02 DIAGNOSIS — M1712 Unilateral primary osteoarthritis, left knee: Secondary | ICD-10-CM | POA: Diagnosis not present

## 2021-08-03 ENCOUNTER — Telehealth: Payer: Self-pay

## 2021-08-03 NOTE — Telephone Encounter (Signed)
Spoke with patient regarding request for a new PCP.  Patient  stated that he has been out of his Jardiance medication for a month due to the "donut hole"  and is extremely frustrated because the coupon that the provider gave to him did not help him afford his medication.  Patient was angry and used profanity to describe his displeasure. Advised patient that I would discuss concerns with provider and call back as soon as possible.

## 2021-08-04 ENCOUNTER — Encounter: Payer: Self-pay | Admitting: Internal Medicine

## 2021-08-04 ENCOUNTER — Ambulatory Visit (INDEPENDENT_AMBULATORY_CARE_PROVIDER_SITE_OTHER): Payer: Medicare HMO | Admitting: Internal Medicine

## 2021-08-04 DIAGNOSIS — E119 Type 2 diabetes mellitus without complications: Secondary | ICD-10-CM

## 2021-08-04 MED ORDER — METFORMIN HCL 500 MG PO TABS
1000.0000 mg | ORAL_TABLET | Freq: Two times a day (BID) | ORAL | 5 refills | Status: DC
Start: 2021-08-04 — End: 2022-06-20

## 2021-08-04 MED ORDER — GLIPIZIDE 5 MG PO TABS: 5.0000 mg | ORAL_TABLET | Freq: Two times a day (BID) | ORAL | 0 refills | Status: DC

## 2021-08-04 NOTE — Progress Notes (Addendum)
There were no vitals taken for this visit.   Subjective:    Patient ID: Ryan Kirby, male    DOB: Feb 09, 1941, 80 y.o.   MRN: [id number]  Chief Complaint  Patient presents with   Medication Problem    HPI: Ryan Kirby is a 80 y.o. male   This visit was completed via telephone due to the restrictions of the COVID-19 pandemic. All issues as above were discussed and addressed but no physical exam was performed. If it was felt that the patient should be evaluated in the office, they were directed there. The patient verbally consented to this visit. Patient was unable to complete an audio/visual visit due to Technical difficulties. Due to the catastrophic nature of the COVID-19 pandemic, this visit was done through audio contact only. Location of the patient: home Location of the provider: work Those involved with this call:  Provider: Charlynne Cousins, MD CMA: Frazier Butt, [place] Desk/Registration: [employer]  Time spent on call: 10 minutes on the phone discussing health concerns. 10 minutes total spent in review of patient's record and preparation of their chart.    Diabetes He presents for his follow-up diabetic visit. He has type 2 diabetes mellitus. His disease course has been worsening. Pertinent negatives for diabetes include no blurred vision, no chest pain, no fatigue, no foot paresthesias, no foot ulcerations, no polydipsia, no polyphagia, no polyuria, no visual change, no weakness and no weight loss.   Chief Complaint  Patient presents with   Medication Problem    Relevant past medical, surgical, family and social history reviewed and updated as indicated. Interim medical history since our last visit reviewed. Allergies and medications reviewed and updated.  Review of Systems  Constitutional:  Negative for fatigue and weight loss.  Eyes:  Negative for blurred vision.  Cardiovascular:  Negative for chest pain.  Endocrine: Negative for polydipsia, polyphagia and  polyuria.  Neurological:  Negative for weakness.   Per HPI unless specifically indicated above     Objective:    There were no vitals taken for this visit.  Wt Readings from Last 3 Encounters:  07/17/21 176 lb 12.8 oz (80.2 kg)  07/04/21 180 lb (81.6 kg)  04/17/21 180 lb (81.6 kg)    Physical Exam  Unable to peform sec to virtual visit.   Results for orders placed or performed in visit on 07/17/21  Novel Coronavirus, NAA ([facility])   Specimen: Nasopharyngeal(NP) swabs in vial transport medium  Result Value Ref Range   SARS-CoV-2, NAA Not Detected Not Detected  SARS-COV-2, NAA 2 DAY TAT  Result Value Ref Range   SARS-CoV-2, NAA 2 DAY TAT Performed   Veritor Flu A/B Waived  Result Value Ref Range   Influenza A Negative Negative   Influenza B Negative Negative        Current Outpatient Medications:    ACCU-CHEK GUIDE test strip, , Disp: , Rfl:    Accu-Chek Softclix Lancets lancets, , Disp: , Rfl:    acetaminophen (TYLENOL) 500 MG tablet, Take 500 mg by mouth every 6 (six) hours as needed., Disp: , Rfl:    amLODipine (NORVASC) 10 MG tablet, TAKE 1 TABLET EVERY DAY, Disp: 90 tablet, Rfl: 0   benazepril-hydrochlorthiazide (LOTENSIN HCT) 20-12.5 MG tablet, TAKE 1 TABLET EVERY DAY, Disp: 90 tablet, Rfl: 0   benzonatate (TESSALON) 100 MG capsule, Take 1 capsule (100 mg total) by mouth 2 (two) times daily as needed for cough., Disp: 20 capsule, Rfl: 0   Blood  Glucose Monitoring Suppl (ACCU-CHEK GUIDE ME) w/Device KIT, , Disp: , Rfl:    carvedilol (COREG) 25 MG tablet, TAKE 2 TABLETS TWICE DAILY, Disp: 360 tablet, Rfl: 0   cetirizine (ZYRTEC) 10 MG tablet, Take 10 mg by mouth as needed. , Disp: , Rfl:    cloNIDine (CATAPRES) 0.3 MG tablet, TAKE 1 TABLET TWICE DAILY, Disp: 180 tablet, Rfl: 0   docusate sodium (COLACE) 100 MG capsule, Take 100 mg by mouth every other day. , Disp: , Rfl:    doxazosin (CARDURA) 2 MG tablet, TAKE 1 TABLET EVERY MORNING, Disp: 90 tablet, Rfl: 0    empagliflozin (JARDIANCE) 25 MG TABS tablet, Take 1 tablet (25 mg total) by mouth daily before breakfast., Disp: 90 tablet, Rfl: 1   fluticasone (FLONASE) 50 MCG/ACT nasal spray, Place 2 sprays into both nostrils daily., Disp: 16 g, Rfl: 6   furosemide (LASIX) 40 MG tablet, TAKE 1 TABLET EVERY DAY, Disp: 90 tablet, Rfl: 0   omeprazole (PRILOSEC OTC) 20 MG tablet, Take 20 mg by mouth as needed. , Disp: , Rfl:    potassium chloride SA (KLOR-CON) 20 MEQ tablet, TAKE 1 TABLET EVERY DAY, Disp: 90 tablet, Rfl: 0   pravastatin (PRAVACHOL) 40 MG tablet, TAKE 1 TABLET AT BEDTIME, Disp: 90 tablet, Rfl: 0   senna (SENOKOT) 8.6 MG TABS tablet, Take 2 tablets (17.2 mg total) by mouth at bedtime as needed for mild constipation., Disp: 120 each, Rfl: 0   triamcinolone (KENALOG) 0.1 %, Apply 1 application topically 2 (two) times daily., Disp: 30 g, Rfl: 0   glipiZIDE (GLUCOTROL) 5 MG tablet, Take 1 tablet (5 mg total) by mouth 2 (two) times daily before a meal., Disp: 60 tablet, Rfl: 0   metFORMIN (GLUCOPHAGE) 500 MG tablet, Take 2 tablets (1,000 mg total) by mouth 2 (two) times daily., Disp: 60 tablet, Rfl: 5    Assessment & Plan:  DM : increase metformin to 1000 mg bid increase glipizide to 5 mg bid with meals Has had a problem with being in the donut hole with jardiacne.  check HbA1c,  urine  microalbumin  diabetic diet plan given to pt  adviced regarding hypoglycemia and instructions given to pt today on how to prevent and treat the same if it were to occur. pt acknowledges the plan and voices understanding of the same.  exercise plan given and encouraged.   advice diabetic yearly podiatry, ophthalmology , nutritionist , dental check q 6 months,  Problem List Items Addressed This Visit       Endocrine   Diabetes mellitus without complication ([place])   Relevant Medications   metFORMIN (GLUCOPHAGE) 500 MG tablet   glipiZIDE (GLUCOTROL) 5 MG tablet     No orders of the defined types were placed in this  encounter.    Meds ordered this encounter  Medications   metFORMIN (GLUCOPHAGE) 500 MG tablet    Sig: Take 2 tablets (1,000 mg total) by mouth 2 (two) times daily.    Dispense:  60 tablet    Refill:  5   glipiZIDE (GLUCOTROL) 5 MG tablet    Sig: Take 1 tablet (5 mg total) by mouth 2 (two) times daily before a meal.    Dispense:  60 tablet    Refill:  0     Follow up plan: No follow-ups on file.

## 2021-08-04 NOTE — Telephone Encounter (Signed)
I will try and see if our clinical pharmacist can work with him. Donut hole is probably why he cannot get the meds. Will call pt.

## 2021-08-15 ENCOUNTER — Telehealth: Payer: Self-pay

## 2021-08-15 NOTE — Chronic Care Management (AMB) (Signed)
Chronic Care Management Pharmacy Assistant   Name: Ryan Kirby  MRN: [id number] DOB: [birth year]  Reason for Encounter: Disease State Diabetes Mellitus  Recent office visits:  08/04/21-Avanti Vigg, MD (PCP) Follow up diabetic visit. 07/17/21-Lauren Avelina Laine, NP. Seen for upper respiratory tract infection. Flu test completed and COVID test completed. Start flonase daily Take tessalon as needed for cough (up to 3 times a day). 07/04/21-Avanti Vigg, MD (PCP) General follow up visit. Flu vaccine given. Pneumococcal vaccine given. 04/03/21-Avanti Vigg, MD (PCP) General follow up visit. Follow up in 3 months.  Recent consult visits:  None noted  Hospital visits:  None in previous 6 months  Medications: Outpatient Encounter Medications as of 08/15/2021  Medication Sig   ACCU-CHEK GUIDE test strip    Accu-Chek Softclix Lancets lancets    acetaminophen (TYLENOL) 500 MG tablet Take 500 mg by mouth every 6 (six) hours as needed.   amLODipine (NORVASC) 10 MG tablet TAKE 1 TABLET EVERY DAY   benazepril-hydrochlorthiazide (LOTENSIN HCT) 20-12.5 MG tablet TAKE 1 TABLET EVERY DAY   benzonatate (TESSALON) 100 MG capsule Take 1 capsule (100 mg total) by mouth 2 (two) times daily as needed for cough.   Blood Glucose Monitoring Suppl (ACCU-CHEK GUIDE ME) w/Device KIT    carvedilol (COREG) 25 MG tablet TAKE 2 TABLETS TWICE DAILY   cetirizine (ZYRTEC) 10 MG tablet Take 10 mg by mouth as needed.    cloNIDine (CATAPRES) 0.3 MG tablet TAKE 1 TABLET TWICE DAILY   docusate sodium (COLACE) 100 MG capsule Take 100 mg by mouth every other day.    doxazosin (CARDURA) 2 MG tablet TAKE 1 TABLET EVERY MORNING   empagliflozin (JARDIANCE) 25 MG TABS tablet Take 1 tablet (25 mg total) by mouth daily before breakfast.   fluticasone (FLONASE) 50 MCG/ACT nasal spray Place 2 sprays into both nostrils daily.   furosemide (LASIX) 40 MG tablet TAKE 1 TABLET EVERY DAY   glipiZIDE (GLUCOTROL) 5 MG tablet Take 1  tablet (5 mg total) by mouth 2 (two) times daily before a meal.   metFORMIN (GLUCOPHAGE) 500 MG tablet Take 2 tablets (1,000 mg total) by mouth 2 (two) times daily.   omeprazole (PRILOSEC OTC) 20 MG tablet Take 20 mg by mouth as needed.    potassium chloride SA (KLOR-CON) 20 MEQ tablet TAKE 1 TABLET EVERY DAY   pravastatin (PRAVACHOL) 40 MG tablet TAKE 1 TABLET AT BEDTIME   senna (SENOKOT) 8.6 MG TABS tablet Take 2 tablets (17.2 mg total) by mouth at bedtime as needed for mild constipation.   triamcinolone (KENALOG) 0.1 % Apply 1 application topically 2 (two) times daily.   No facility-administered encounter medications on file as of 08/15/2021.   Current antihyperglycemic regimen:  Glipizide 5 mg take 1 tab daily Metformin 500 mg take 2 tabs twice daily Jardiance 25 mg take 1 tab daily  What recent interventions/DTPs have been made to improve glycemic control:  None noted  Have there been any recent hospitalizations or ED visits since last visit with CPP? No  Patient denies hypoglycemic symptoms, including Pale, Sweaty, Shaky, Hungry, Nervous/irritable, and Vision changes  Patient denies hyperglycemic symptoms, including blurry vision, excessive thirst, fatigue, polyuria, and weakness  How often are you checking your blood sugar? once daily  What are your blood sugars ranging?  Fasting:  Before meals: 170-270 After meals:  Bedtime:   During the week, how often does your blood glucose drop below 70? Never  Are you checking your feet daily/regularly?  Patient states he has check his feet regularly.  Patient states he has been out of his Jardiance 6 weeks due to cost. He had requested the refill by his provider as well. I explained to him we can process the PAP for him and maybe qualify him for that and he stated he can wait until the end of the year since he gets samples from his pharmacy. I recommended if he has trouble getting his Jardiance we can start on the PAP process and  gave him my contact if he wishes to start on it.  Adherence Review: Is the patient currently on a STATIN medication? Yes Is the patient currently on ACE/ARB medication? Yes Does the patient have >5 day gap between last estimated fill dates? Yes   Care Gaps: Zoster Vaccines- Shingrix:Never done COVID-19 Vaccine:Last completed: Aug 23, 2020 FOOT EXAM:Last completed: Feb 17, 2020  Star Rating Drugs: Glipizide 5 mg Last filled:02/01/21 90 DS Metformin 500 mg Last filled:10/08/20 90 DS Jardiance 25 mg Last filled:06/04/21 30 DS Pravastatin 40 mg Last filled:10/08/20 90 DS Benazepril-hydrochlorothiazide 20-12.5 Last filled:10/08/20 90 DS   Myriam Elta [place], [place]

## 2021-09-27 ENCOUNTER — Other Ambulatory Visit: Payer: Medicare HMO

## 2021-09-27 ENCOUNTER — Other Ambulatory Visit: Payer: Self-pay

## 2021-09-27 DIAGNOSIS — E1159 Type 2 diabetes mellitus with other circulatory complications: Secondary | ICD-10-CM | POA: Diagnosis not present

## 2021-09-27 DIAGNOSIS — I152 Hypertension secondary to endocrine disorders: Secondary | ICD-10-CM | POA: Diagnosis not present

## 2021-09-27 DIAGNOSIS — E1169 Type 2 diabetes mellitus with other specified complication: Secondary | ICD-10-CM | POA: Diagnosis not present

## 2021-09-27 DIAGNOSIS — E785 Hyperlipidemia, unspecified: Secondary | ICD-10-CM | POA: Diagnosis not present

## 2021-09-27 DIAGNOSIS — R5383 Other fatigue: Secondary | ICD-10-CM | POA: Diagnosis not present

## 2021-09-27 DIAGNOSIS — E119 Type 2 diabetes mellitus without complications: Secondary | ICD-10-CM | POA: Diagnosis not present

## 2021-09-27 LAB — URINALYSIS, ROUTINE W REFLEX MICROSCOPIC
Bilirubin, UA: NEGATIVE
Glucose, UA: NEGATIVE
Ketones, UA: NEGATIVE
Leukocytes,UA: NEGATIVE
Nitrite, UA: NEGATIVE
Protein,UA: NEGATIVE
RBC, UA: NEGATIVE
Specific Gravity, UA: 1.02 (ref 1.005–1.030)
Urobilinogen, Ur: 0.2 mg/dL (ref 0.2–1.0)
pH, UA: 7 (ref 5.0–7.5)

## 2021-09-27 LAB — MICROALBUMIN, URINE WAIVED
Creatinine, Urine Waived: 50 mg/dL (ref 10–300)
Microalb, Ur Waived: 10 mg/L (ref 0–19)
Microalb/Creat Ratio: <30 mg/g (ref <30)

## 2021-09-27 LAB — BAYER DCA HB A1C WAIVED: HB A1C (BAYER DCA - WAIVED): 7.4 % — ABNORMAL HIGH (ref 4.8–5.6)

## 2021-09-28 LAB — BASIC METABOLIC PANEL
BUN/Creatinine Ratio: 14 (ref 10–24)
BUN: 15 mg/dL (ref 8–27)
CO2: 28 mmol/L (ref 20–29)
Calcium: 10.6 mg/dL — ABNORMAL HIGH (ref 8.6–10.2)
Chloride: 100 mmol/L (ref 96–106)
Creatinine, Ser: 1.1 mg/dL (ref 0.76–1.27)
Glucose: 160 mg/dL — ABNORMAL HIGH (ref 70–99)
Potassium: 3.8 mmol/L (ref 3.5–5.2)
Sodium: 141 mmol/L (ref 134–144)
eGFR: 68 mL/min/{1.73_m2} (ref >59)

## 2021-09-28 LAB — LIPID PANEL
Chol/HDL Ratio: 2.4 ratio (ref 0.0–5.0)
Cholesterol, Total: 140 mg/dL (ref 100–199)
HDL: 58 mg/dL (ref >39)
LDL Chol Calc (NIH): 65 mg/dL (ref 0–99)
Triglycerides: 88 mg/dL (ref 0–149)
VLDL Cholesterol Cal: 17 mg/dL (ref 5–40)

## 2021-09-28 LAB — VITAMIN B12: Vitamin B-12: 263 pg/mL (ref 232–1245)

## 2021-10-04 ENCOUNTER — Ambulatory Visit: Payer: Medicare HMO | Admitting: Internal Medicine

## 2021-10-04 ENCOUNTER — Encounter: Payer: Self-pay | Admitting: Nurse Practitioner

## 2021-10-04 ENCOUNTER — Other Ambulatory Visit: Payer: Self-pay

## 2021-10-04 ENCOUNTER — Ambulatory Visit (INDEPENDENT_AMBULATORY_CARE_PROVIDER_SITE_OTHER): Payer: Medicare HMO | Admitting: Nurse Practitioner

## 2021-10-04 VITALS — BP 129/69 | HR 71 | Temp 98.6°F | Wt 181.2 lb

## 2021-10-04 DIAGNOSIS — I2583 Coronary atherosclerosis due to lipid rich plaque: Secondary | ICD-10-CM

## 2021-10-04 DIAGNOSIS — N183 Chronic kidney disease, stage 3 unspecified: Secondary | ICD-10-CM | POA: Diagnosis not present

## 2021-10-04 DIAGNOSIS — E1169 Type 2 diabetes mellitus with other specified complication: Secondary | ICD-10-CM | POA: Diagnosis not present

## 2021-10-04 DIAGNOSIS — E119 Type 2 diabetes mellitus without complications: Secondary | ICD-10-CM

## 2021-10-04 DIAGNOSIS — I152 Hypertension secondary to endocrine disorders: Secondary | ICD-10-CM

## 2021-10-04 DIAGNOSIS — I2510 Atherosclerotic heart disease of native coronary artery without angina pectoris: Secondary | ICD-10-CM

## 2021-10-04 DIAGNOSIS — E785 Hyperlipidemia, unspecified: Secondary | ICD-10-CM | POA: Diagnosis not present

## 2021-10-04 DIAGNOSIS — E1122 Type 2 diabetes mellitus with diabetic chronic kidney disease: Secondary | ICD-10-CM | POA: Diagnosis not present

## 2021-10-04 DIAGNOSIS — E1159 Type 2 diabetes mellitus with other circulatory complications: Secondary | ICD-10-CM

## 2021-10-04 NOTE — Progress Notes (Signed)
° °BP 129/69    Pulse 71    Temp 98.6 °F (37 °C) (Oral)    Wt 181 lb 3.2 oz (82.2 kg)    SpO2 94%    BMI 26.07 kg/m²   ° °Subjective:  ° ° Patient ID: Ryan Kirby, male    DOB: 05/30/1941, 80 y.o.   MRN: [id number] ° °HPI: °Ryan Kirby is a 80 y.o. male ° °Chief Complaint  °Patient presents with  ° Diabetes  ° Hypertension  ° Hyperlipidemia  ° °HYPERTENSION / HYPERLIPIDEMIA °Satisfied with current treatment? no °Duration of hypertension: years °BP monitoring frequency: not checking °BP range:  °BP medication side effects: no °Past BP meds: amlodipine and benazepril/HCTZ °Duration of hyperlipidemia: years °Cholesterol medication side effects: no °Cholesterol supplements: none °Past cholesterol medications: pravastatin (pravachol) °Medication compliance: excellent compliance °Aspirin: no °Recent stressors: no °Recurrent headaches: no °Visual changes: no °Palpitations: no °Dyspnea: no °Chest pain: no °Lower extremity edema: no °Dizzy/lightheaded: no ° °DIABETES °Hypoglycemic episodes:no °Polydipsia/polyuria: no °Visual disturbance: no °Chest pain: no °Paresthesias: no °Glucose Monitoring: no ° Accucheck frequency: Not Checking ° Fasting glucose: ° Post prandial: ° Evening: ° Before meals: °Taking Insulin?: no ° Long acting insulin: ° Short acting insulin: °Blood Pressure Monitoring: not checking °Retinal Examination: Up to Date °Foot Exam: Up to Date °Diabetic Education: Not Completed °Pneumovax: Up to Date °Influenza: Up to Date °Aspirin: yes ° °Relevant past medical, surgical, family and social history reviewed and updated as indicated. Interim medical history since our last visit reviewed. °Allergies and medications reviewed and updated. ° °Review of Systems  °Eyes:  Negative for visual disturbance.  °Respiratory:  Negative for chest tightness and shortness of breath.   °Cardiovascular:  Negative for chest pain, palpitations and leg swelling.  °Endocrine: Negative for polydipsia and polyuria.  °Neurological:   Negative for dizziness, light-headedness, numbness and headaches.  ° °Per HPI unless specifically indicated above ° °   °Objective:  °  °BP 129/69    Pulse 71    Temp 98.6 °F (37 °C) (Oral)    Wt 181 lb 3.2 oz (82.2 kg)    SpO2 94%    BMI 26.07 kg/m²   °Wt Readings from Last 3 Encounters:  °10/04/21 181 lb 3.2 oz (82.2 kg)  °07/17/21 176 lb 12.8 oz (80.2 kg)  °07/04/21 180 lb (81.6 kg)  °  °Physical Exam °Vitals and nursing note reviewed.  °Constitutional:   °   General: He is not in acute distress. °   Appearance: Normal appearance. He is not ill-appearing, toxic-appearing or diaphoretic.  °HENT:  °   Head: Normocephalic.  °   Right Ear: External ear normal.  °   Left Ear: External ear normal.  °   Nose: Nose normal. No congestion or rhinorrhea.  °   Mouth/Throat:  °   Mouth: Mucous membranes are moist.  °Eyes:  °   General:     °   Right eye: No discharge.     °   Left eye: No discharge.  °   Extraocular Movements: Extraocular movements intact.  °   Conjunctiva/sclera: Conjunctivae normal.  °   Pupils: Pupils are equal, round, and reactive to light.  °Cardiovascular:  °   Rate and Rhythm: Normal rate and regular rhythm.  °   Heart sounds: No murmur heard. °Pulmonary:  °   Effort: Pulmonary effort is normal. No respiratory distress.  °   Breath sounds: Normal breath sounds. No wheezing, rhonchi or rales.  °Abdominal:  °     Abdominal:     General: Abdomen is flat. Bowel sounds are normal.  Musculoskeletal:     Cervical back: Normal range of motion and neck supple.  Skin:    General: Skin is warm and dry.     Capillary Refill: Capillary refill takes less than 2 seconds.  Neurological:     General: No focal deficit present.     Mental Status: He is alert and oriented to person, place, and time.  Psychiatric:        Mood and Affect: Mood normal.        Behavior: Behavior normal.        Thought Content: Thought content normal.        Judgment: Judgment normal.    Results for orders placed or performed in visit on  09/27/21  Bayer DCA Hb A1c Waived  Result Value Ref Range   HB A1C (BAYER DCA - WAIVED) 7.4 (H) 4.8 - 5.6 %  Basic metabolic panel  Result Value Ref Range   Glucose 160 (H) 70 - 99 mg/dL   BUN 15 8 - 27 mg/dL   Creatinine, Ser 1.10 0.76 - 1.27 mg/dL   eGFR 68 >59 mL/min/1.73   BUN/Creatinine Ratio 14 10 - 24   Sodium 141 134 - 144 mmol/L   Potassium 3.8 3.5 - 5.2 mmol/L   Chloride 100 96 - 106 mmol/L   CO2 28 20 - 29 mmol/L   Calcium 10.6 (H) 8.6 - 10.2 mg/dL  Urinalysis, Routine w reflex microscopic  Result Value Ref Range   Specific Gravity, UA 1.020 1.005 - 1.030   pH, UA 7.0 5.0 - 7.5   Color, UA Yellow Yellow   Appearance Ur Clear Clear   Leukocytes,UA Negative Negative   Protein,UA Negative Negative/Trace   Glucose, UA Negative Negative   Ketones, UA Negative Negative   RBC, UA Negative Negative   Bilirubin, UA Negative Negative   Urobilinogen, Ur 0.2 0.2 - 1.0 mg/dL   Nitrite, UA Negative Negative  Vitamin B12  Result Value Ref Range   Vitamin B-12 263 232 - 1,245 pg/mL  Lipid panel  Result Value Ref Range   Cholesterol, Total 140 100 - 199 mg/dL   Triglycerides 88 0 - 149 mg/dL   HDL 58 >39 mg/dL   VLDL Cholesterol Cal 17 5 - 40 mg/dL   LDL Chol Calc (NIH) 65 0 - 99 mg/dL   Chol/HDL Ratio 2.4 0.0 - 5.0 ratio  Microalbumin, Urine Waived  Result Value Ref Range   Microalb, Ur Waived 10 0 - 19 mg/L   Creatinine, Urine Waived 50 10 - 300 mg/dL   Microalb/Creat Ratio <30 <30 mg/g      Assessment & Plan:   Problem List Items Addressed This Visit       Cardiovascular and Mediastinum   Hypertension associated with diabetes ([place]) - Primary    Chronic, Well controlled.  Continue current medication regimen at this time and adjust as needed.  Monitor closely for hypotension due to his advanced age and may need to minimize medications.  Recommend he monitor BP daily and document at home + focus on DASH diet.  Return in 3 months to meet new PCP.  Labs reviewed from  last week with patient during visit.      CAD (coronary artery disease)    Stable, recheck lipids and continue current regimen.        Endocrine   Hyperlipidemia associated with type 2 diabetes mellitus ([place])  Chronic.  Controlled.  Continue with current medication regimen on Pravastatin 66m daily.  Reviewed labs with patient from last week.  Return to clinic in 3 months for reevaluation.  Call sooner if concerns arise.        CKD stage 3 secondary to diabetes ([facility])    Chronic, ongoing with A1c 7.4% last week.  Up from prior. Will continue with out Jardiance due to the donut hole.  If elevated at next visit, will add Jardiance back to regimen. Continue current medication regimen and adjust as needed based on A1c and kidney function.  Recommend he monitor BS at home a few days a week and document.  Focus on diet changes. Return to office in 3 months to repeat labs.      Diabetes mellitus without complication ([facility])    Chronic, ongoing with A1c 7.4% last week.  Up from prior. Will continue with out Jardiance due to the donut hole.  If elevated at next visit, will add Jardiance back to regimen. Continue current medication regimen and adjust as needed based on A1c and kidney function.  Recommend he monitor BS at home a few days a week and document.  Focus on diet changes. Return to office in 3 months to repeat labs.        Follow up plan: Return in about 3 months (around 01/01/2022) for HTN, HLD, DM2 FU.

## 2021-10-04 NOTE — Assessment & Plan Note (Signed)
Chronic, Well controlled.  Continue current medication regimen at this time and adjust as needed.  Monitor closely for hypotension due to his advanced age and may need to minimize medications.  Recommend he monitor BP daily and document at home + focus on DASH diet.  Return in 3 months to meet new PCP.  Labs reviewed from last week with patient during visit.

## 2021-10-04 NOTE — Assessment & Plan Note (Signed)
Chronic, ongoing with A1c 7.4% last week.  Up from prior. Will continue with out Jardiance due to the donut hole.  If elevated at next visit, will add Jardiance back to regimen. Continue current medication regimen and adjust as needed based on A1c and kidney function.  Recommend he monitor BS at home a few days a week and document.  Focus on diet changes. Return to office in 3 months to repeat labs.

## 2021-10-04 NOTE — Assessment & Plan Note (Signed)
Stable, recheck lipids and continue current regimen. 

## 2021-10-04 NOTE — Assessment & Plan Note (Signed)
Chronic.  Controlled.  Continue with current medication regimen on Pravastatin 40mg  daily.  Reviewed labs with patient from last week.  Return to clinic in 3 months for reevaluation.  Call sooner if concerns arise.

## 2022-01-01 ENCOUNTER — Encounter: Payer: Self-pay | Admitting: Nurse Practitioner

## 2022-01-01 ENCOUNTER — Ambulatory Visit (INDEPENDENT_AMBULATORY_CARE_PROVIDER_SITE_OTHER): Payer: Medicare HMO | Admitting: Nurse Practitioner

## 2022-01-01 VITALS — BP 155/74 | HR 69 | Temp 98.3°F | Wt 181.2 lb

## 2022-01-01 DIAGNOSIS — E1122 Type 2 diabetes mellitus with diabetic chronic kidney disease: Secondary | ICD-10-CM | POA: Diagnosis not present

## 2022-01-01 DIAGNOSIS — E785 Hyperlipidemia, unspecified: Secondary | ICD-10-CM

## 2022-01-01 DIAGNOSIS — I2510 Atherosclerotic heart disease of native coronary artery without angina pectoris: Secondary | ICD-10-CM

## 2022-01-01 DIAGNOSIS — I152 Hypertension secondary to endocrine disorders: Secondary | ICD-10-CM

## 2022-01-01 DIAGNOSIS — I2583 Coronary atherosclerosis due to lipid rich plaque: Secondary | ICD-10-CM

## 2022-01-01 DIAGNOSIS — E1159 Type 2 diabetes mellitus with other circulatory complications: Secondary | ICD-10-CM

## 2022-01-01 DIAGNOSIS — E1169 Type 2 diabetes mellitus with other specified complication: Secondary | ICD-10-CM | POA: Diagnosis not present

## 2022-01-01 DIAGNOSIS — J069 Acute upper respiratory infection, unspecified: Secondary | ICD-10-CM

## 2022-01-01 DIAGNOSIS — E119 Type 2 diabetes mellitus without complications: Secondary | ICD-10-CM | POA: Diagnosis not present

## 2022-01-01 DIAGNOSIS — N183 Chronic kidney disease, stage 3 unspecified: Secondary | ICD-10-CM

## 2022-01-01 MED ORDER — AZITHROMYCIN 250 MG PO TABS: ORAL_TABLET | ORAL | 0 refills | Status: AC

## 2022-01-01 NOTE — Assessment & Plan Note (Signed)
Chronic, ongoing with A1c 7.4% last visit.  Will restart Jardiance if A1c is elevated.  Continue current medication regimen of Metformin and Glipizide and adjust as needed based on A1c and kidney function.  Recommend he monitor BS at home a few days a week and document.  Focus on diet changes. Return to office in 3 months to repeat labs. ?

## 2022-01-01 NOTE — Assessment & Plan Note (Signed)
Chronic.  Controlled.  Will make recommendations based on lab results. ?

## 2022-01-01 NOTE — Assessment & Plan Note (Signed)
Chronic. Elevated at visit today. Likely due to taking Mucinex.  Will follow up in 1 month to make sure blood pressure has improved.  If not improved in 1 month will make adjustments to medications. ?

## 2022-01-01 NOTE — Assessment & Plan Note (Signed)
Chronic.  Controlled.  Continue with current medication regimen on Pravastatin 40mg daily.  Labs ordered today.  Return to clinic in 3 months for reevaluation.  Call sooner if concerns arise.  

## 2022-01-01 NOTE — Progress Notes (Signed)
? ?BP (!) 155/74   Pulse 69   Temp 98.3 ?F (36.8 ?C) (Oral)   Wt 181 lb 3.2 oz (82.2 kg)   SpO2 100%   BMI 26.07 kg/m?   ? ?Subjective:  ? ? Patient ID: Ryan Kirby, male    DOB: 1941-05-28, 81 y.o.   MRN: [id number] ? ?HPI: ?Ryan Kirby is a 81 y.o. male ? ?Chief Complaint  ?Patient presents with  ? Hypertension  ? Hyperlipidemia  ? Diabetes  ? ?HYPERTENSION / HYPERLIPIDEMIA ?Satisfied with current treatment? no ?Duration of hypertension: years ?BP monitoring frequency: not checking ?BP range:  ?BP medication side effects: no ?Past BP meds: amlodipine and benazepril/HCTZ ?Duration of hyperlipidemia: years ?Cholesterol medication side effects: no ?Cholesterol supplements: none ?Past cholesterol medications: pravastatin (pravachol) ?Medication compliance: excellent compliance ?Aspirin: no ?Recent stressors: no ?Recurrent headaches: no ?Visual changes: no ?Palpitations: no ?Dyspnea: no ?Chest pain: no ?Lower extremity edema: no ?Dizzy/lightheaded: no ? ?DIABETES ?Hypoglycemic episodes:no ?Polydipsia/polyuria: no ?Visual disturbance: no ?Chest pain: no ?Paresthesias: no ?Glucose Monitoring: no ? Accucheck frequency: Not Checking ? Fasting glucose: ? Post prandial: ? Evening: ? Before meals: ?Taking Insulin?: no ? Long acting insulin: ? Short acting insulin: ?Blood Pressure Monitoring: not checking ?Retinal Examination: Up to Date ?Foot Exam: Up to Date ?Diabetic Education: Not Completed ?Pneumovax: Up to Date ?Influenza: Up to Date ?Aspirin: yes ? ?UPPER RESPIRATORY TRACT INFECTION ?Worst symptom: symptoms started last wednesday ?Fever: no ?Cough: yes ?Shortness of breath: no ?Wheezing: no ?Chest pain: yes, with cough ?Chest tightness: no ?Chest congestion: no ?Nasal congestion: yes ?Runny nose: yes ?Post nasal drip: yes ?Sneezing: yes ?Sore throat: no ?Swollen glands: no ?Sinus pressure: no ?Headache: no ?Face pain: no ?Toothache: no ?Ear pain: no bilateral ?Ear pressure: no bilateral ?Eyes  red/itching:no ?Eye drainage/crusting: yes  ?Vomiting: no ?Rash: no ?Fatigue: yes ?Sick contacts: no ?Strep contacts: no  ?Context: stable ?Recurrent sinusitis: no ?Relief with OTC cold/cough medications: no  ?Treatments attempted: mucinex  ? ? ?Relevant past medical, surgical, family and social history reviewed and updated as indicated. Interim medical history since our last visit reviewed. ?Allergies and medications reviewed and updated. ? ?Review of Systems  ?Constitutional:  Positive for fatigue. Negative for fever.  ?HENT:  Positive for congestion, postnasal drip, rhinorrhea and sneezing. Negative for ear pain, sinus pressure, sinus pain and sore throat.   ?Eyes:  Negative for visual disturbance.  ?Respiratory:  Positive for cough. Negative for chest tightness, shortness of breath and wheezing.   ?Cardiovascular:  Negative for chest pain, palpitations and leg swelling.  ?Gastrointestinal:  Negative for vomiting.  ?Endocrine: Negative for polydipsia and polyuria.  ?Skin:  Negative for rash.  ?Neurological:  Negative for dizziness, light-headedness, numbness and headaches.  ? ?Per HPI unless specifically indicated above ? ?   ?Objective:  ?  ?BP (!) 155/74   Pulse 69   Temp 98.3 ?F (36.8 ?C) (Oral)   Wt 181 lb 3.2 oz (82.2 kg)   SpO2 100%   BMI 26.07 kg/m?   ?Wt Readings from Last 3 Encounters:  ?01/01/22 181 lb 3.2 oz (82.2 kg)  ?10/04/21 181 lb 3.2 oz (82.2 kg)  ?07/17/21 176 lb 12.8 oz (80.2 kg)  ?  ?Physical Exam ?Vitals and nursing note reviewed.  ?Constitutional:   ?   General: He is not in acute distress. ?   Appearance: Normal appearance. He is not ill-appearing, toxic-appearing or diaphoretic.  ?HENT:  ?   Head: Normocephalic.  ?   Right Ear: Tympanic  membrane and external ear normal.  ?   Left Ear: Tympanic membrane and external ear normal.  ?   Nose: Congestion and rhinorrhea present.  ?   Mouth/Throat:  ?   Mouth: Mucous membranes are moist.  ?   Pharynx: Posterior oropharyngeal erythema present.  No oropharyngeal exudate.  ?Eyes:  ?   General:     ?   Right eye: No discharge.     ?   Left eye: No discharge.  ?   Extraocular Movements: Extraocular movements intact.  ?   Conjunctiva/sclera: Conjunctivae normal.  ?   Pupils: Pupils are equal, round, and reactive to light.  ?Cardiovascular:  ?   Rate and Rhythm: Normal rate and regular rhythm.  ?   Heart sounds: No murmur heard. ?Pulmonary:  ?   Effort: Pulmonary effort is normal. No respiratory distress.  ?   Breath sounds: Normal breath sounds. No wheezing, rhonchi or rales.  ?Abdominal:  ?   General: Abdomen is flat. Bowel sounds are normal.  ?Musculoskeletal:  ?   Cervical back: Normal range of motion and neck supple.  ?Skin: ?   General: Skin is warm and dry.  ?   Capillary Refill: Capillary refill takes less than 2 seconds.  ?Neurological:  ?   General: No focal deficit present.  ?   Mental Status: He is alert and oriented to person, place, and time.  ?Psychiatric:     ?   Mood and Affect: Mood normal.     ?   Behavior: Behavior normal.     ?   Thought Content: Thought content normal.     ?   Judgment: Judgment normal.  ? ? ?Results for orders placed or performed in visit on 09/27/21  ?Bayer DCA Hb A1c Waived  ?Result Value Ref Range  ? HB A1C (BAYER DCA - WAIVED) 7.4 (H) 4.8 - 5.6 %  ?Basic metabolic panel  ?Result Value Ref Range  ? Glucose 160 (H) 70 - 99 mg/dL  ? BUN 15 8 - 27 mg/dL  ? Creatinine, Ser 1.10 0.76 - 1.27 mg/dL  ? eGFR 68 >59 mL/min/1.73  ? BUN/Creatinine Ratio 14 10 - 24  ? Sodium 141 134 - 144 mmol/L  ? Potassium 3.8 3.5 - 5.2 mmol/L  ? Chloride 100 96 - 106 mmol/L  ? CO2 28 20 - 29 mmol/L  ? Calcium 10.6 (H) 8.6 - 10.2 mg/dL  ?Urinalysis, Routine w reflex microscopic  ?Result Value Ref Range  ? Specific Gravity, UA 1.020 1.005 - 1.030  ? pH, UA 7.0 5.0 - 7.5  ? Color, UA Yellow Yellow  ? Appearance Ur Clear Clear  ? Leukocytes,UA Negative Negative  ? Protein,UA Negative Negative/Trace  ? Glucose, UA Negative Negative  ? Ketones, UA  Negative Negative  ? RBC, UA Negative Negative  ? Bilirubin, UA Negative Negative  ? Urobilinogen, Ur 0.2 0.2 - 1.0 mg/dL  ? Nitrite, UA Negative Negative  ?Vitamin B12  ?Result Value Ref Range  ? Vitamin B-12 263 232 - 1,245 pg/mL  ?Lipid panel  ?Result Value Ref Range  ? Cholesterol, Total 140 100 - 199 mg/dL  ? Triglycerides 88 0 - 149 mg/dL  ? HDL 58 >39 mg/dL  ? VLDL Cholesterol Cal 17 5 - 40 mg/dL  ? LDL Chol Calc (NIH) 65 0 - 99 mg/dL  ? Chol/HDL Ratio 2.4 0.0 - 5.0 ratio  ?Microalbumin, Urine Waived  ?Result Value Ref Range  ? Microalb, Ur Waived 10 0 -  19 mg/L  ? Creatinine, Urine Waived 50 10 - 300 mg/dL  ? Microalb/Creat Ratio <30 <30 mg/g  ? ?   ?Assessment & Plan:  ? ?Problem List Items Addressed This Visit   ? ?  ? Cardiovascular and Mediastinum  ? Hypertension associated with diabetes ([place]) - Primary  ?  Chronic. Elevated at visit today. Likely due to taking Mucinex.  Will follow up in 1 month to make sure blood pressure has improved.  If not improved in 1 month will make adjustments to medications. ? ?  ?  ? Relevant Orders  ? Comp Met (CMET)  ? CAD (coronary artery disease)  ?  Stable, recheck lipids and continue current regimen. ? ?  ?  ?  ? Endocrine  ? Hyperlipidemia associated with type 2 diabetes mellitus ([place])  ?  Chronic.  Controlled.  Continue with current medication regimen on Pravastatin 54m daily.  Labs ordered today.  Return to clinic in 3 months for reevaluation.  Call sooner if concerns arise.  ? ? ?  ?  ? Relevant Orders  ? Lipid Profile  ? CKD stage 3 secondary to diabetes ([facility]  ?  Chronic.  Controlled.  Will make recommendations based on lab results. ? ?  ?  ? Diabetes mellitus without complication ([place]  ?  Chronic, ongoing with A1c 7.4% last visit.  Will restart Jardiance if A1c is elevated.  Continue current medication regimen of Metformin and Glipizide and adjust as needed based on A1c and kidney function.  Recommend he monitor BS at home a few days a week and document.  Focus  on diet changes. Return to office in 3 months to repeat labs. ?  ?  ? Relevant Orders  ? HgB A1c  ? ?Other Visit Diagnoses   ? ? Upper respiratory tract infection, unspecified type      ? Ongoing x 1 week.  Will treat with azithro

## 2022-01-01 NOTE — Assessment & Plan Note (Signed)
Stable, recheck lipids and continue current regimen. 

## 2022-01-02 ENCOUNTER — Telehealth: Payer: Self-pay

## 2022-01-02 NOTE — Addendum Note (Signed)
Addended by: Jon Billings on: 01/02/2022 09:44 AM ? ? Modules accepted: Orders ? ?

## 2022-01-02 NOTE — Progress Notes (Signed)
Please let patient know that his lab work looks good.  His A1c is well controlled at 7.3.  Patient's calcium has been elevated.  I would like him to come back and recheck the calcium as well as check his parathyroid hormone.  This does not mean that he has a problem with the parathyroid but I want to check it.   ? ?His cholesterol is well controlled.  He should continue with his current medication regimen.

## 2022-01-02 NOTE — Chronic Care Management (AMB) (Signed)
? ? ?Chronic Care Management ?Pharmacy Assistant  ? ?Name: Ryan Kirby  MRN: [id number] DOB: Jul 25, 1941 ? ?Reason for Encounter: Disease State Diabetes Mellitus ? ? ?Recent office visits:  ?01/01/22-Karen Mathis Dad, NP (PCP) Seen for general follow up visit. Labs ordered. Start on azithromycin (ZITHROMAX) 250 MG tablet. Follow up in 1 month. ?10/04/21-Karen Mathis Dad, NP (PCP) Seen for general follow up visit. Follow up in 3 months. ?08/04/21-Avanti Vigg, MD. Seen for a medication problem.  increase metformin to 1000 mg bid increase glipizide to 5 mg bid with meals. ?07/17/21-Lauren Avelina Laine, NP. Seen for upper rspiratory infection. Start Flonase daily and start Tessalon 100 mg cap twice daily as needed.Return if symptoms worsen or fail to improve. ? ?Recent consult visits:  ?08/02/21-Matthew Crawford (Orthopedic Surgery)  ? ?Hospital visits:  ?None in previous 6 months ? ?Medications: ?Outpatient Encounter Medications as of 01/02/2022  ?Medication Sig  ? acetaminophen (TYLENOL) 500 MG tablet Take 500 mg by mouth every 6 (six) hours as needed.  ? amLODipine (NORVASC) 10 MG tablet TAKE 1 TABLET EVERY DAY  ? azithromycin (ZITHROMAX) 250 MG tablet Take 2 tablets on day 1, then 1 tablet daily on days 2 through 5  ? benazepril-hydrochlorthiazide (LOTENSIN HCT) 20-12.5 MG tablet TAKE 1 TABLET EVERY DAY  ? Blood Glucose Monitoring Suppl (ACCU-CHEK GUIDE ME) w/Device KIT   ? carvedilol (COREG) 25 MG tablet TAKE 2 TABLETS TWICE DAILY  ? cloNIDine (CATAPRES) 0.3 MG tablet TAKE 1 TABLET TWICE DAILY  ? docusate sodium (COLACE) 100 MG capsule Take 100 mg by mouth every other day.   ? doxazosin (CARDURA) 2 MG tablet TAKE 1 TABLET EVERY MORNING  ? empagliflozin (JARDIANCE) 25 MG TABS tablet Take 1 tablet (25 mg total) by mouth daily before breakfast. (Patient not taking: Reported on 10/04/2021)  ? fluticasone (FLONASE) 50 MCG/ACT nasal spray Place 2 sprays into both nostrils daily.  ? furosemide (LASIX) 40 MG tablet TAKE 1 TABLET  EVERY DAY  ? glipiZIDE (GLUCOTROL) 5 MG tablet Take 1 tablet (5 mg total) by mouth 2 (two) times daily before a meal.  ? metFORMIN (GLUCOPHAGE) 500 MG tablet Take 2 tablets (1,000 mg total) by mouth 2 (two) times daily.  ? omeprazole (PRILOSEC OTC) 20 MG tablet Take 20 mg by mouth as needed.  (Patient not taking: Reported on 01/01/2022)  ? potassium chloride SA (KLOR-CON) 20 MEQ tablet TAKE 1 TABLET EVERY DAY  ? pravastatin (PRAVACHOL) 40 MG tablet TAKE 1 TABLET AT BEDTIME  ? ?No facility-administered encounter medications on file as of 01/02/2022.  ? ?Current antihyperglycemic regimen:  ?Glipizide 5 mg take 1 tab twice daily ?Metformin 500 mg take 2 tabs twice daily ?Jardiance 25 mg take 1 tab daily ? ? ?What recent interventions/DTPs have been made to improve glycemic control:  ?None noted ? ?Have there been any recent hospitalizations or ED visits since last visit with CPP? No ? ?Patient denies hypoglycemic symptoms, including Pale, Sweaty, Shaky, Hungry, Nervous/irritable, and Vision changes ? ?Patient denies hyperglycemic symptoms, including blurry vision, excessive thirst, fatigue, polyuria, and weakness ? ?How often are you checking your blood sugar?  ?      Patient states he does not check his blood sugar at home. Patient states he just seen his primary care doctor for his a1c level and states it has been doing good. ? ?What are your blood sugars ranging?  ?Fasting: N/A ?Before meals:N/A  ?After meals: N/A  ?Bedtime: N/A  ? ?During the week, how often does your blood  glucose drop below 70? Never ? ?Are you checking your feet daily/regularly?  ?Patient states he does check his feet daily. ? ?Adherence Review: ?Is the patient currently on a STATIN medication? Yes ?Is the patient currently on ACE/ARB medication? Yes ?Does the patient have >5 day gap between last estimated fill dates? Yes ? ? ?Care Gaps: ?Zoster Vaccines:Never done ? ?Star Rating Drugs: ?Glipizide 5 mg Last filled:08/04/21 30 DS ?Metformin 500 mg  Last filled:08/04/21 30 DS ?Jardiance 25 mg Last filled:06/04/21 30 DS ?Pravastatin 40 mg Last filled:10/08/20 90 DS ? ?Corrie Mckusick, RMA ?Health Concierge ? ?

## 2022-01-09 LAB — COMPREHENSIVE METABOLIC PANEL
ALT: 12 IU/L (ref 0–44)
AST: 21 IU/L (ref 0–40)
Albumin/Globulin Ratio: 2.1 (ref 1.2–2.2)
Albumin: 4.6 g/dL (ref 3.7–4.7)
Alkaline Phosphatase: 87 IU/L (ref 44–121)
BUN/Creatinine Ratio: 11 (ref 10–24)
BUN: 12 mg/dL (ref 8–27)
Bilirubin Total: 0.4 mg/dL (ref 0.0–1.2)
CO2: 28 mmol/L (ref 20–29)
Calcium: 11.2 mg/dL — ABNORMAL HIGH (ref 8.6–10.2)
Creatinine, Ser: 1.06 mg/dL (ref 0.76–1.27)
Globulin, Total: 2.2 g/dL (ref 1.5–4.5)
Glucose: 157 mg/dL — ABNORMAL HIGH (ref 70–99)
Total Protein: 6.8 g/dL (ref 6.0–8.5)
eGFR: 71 mL/min/{1.73_m2} (ref >59)

## 2022-01-09 LAB — LIPID PANEL
Chol/HDL Ratio: 2.5 ratio (ref 0.0–5.0)
Cholesterol, Total: 141 mg/dL (ref 100–199)
HDL: 57 mg/dL (ref >39)
LDL Chol Calc (NIH): 66 mg/dL (ref 0–99)
Triglycerides: 99 mg/dL (ref 0–149)
VLDL Cholesterol Cal: 18 mg/dL (ref 5–40)

## 2022-01-09 LAB — HEMOGLOBIN A1C
Est. average glucose Bld gHb Est-mCnc: 163 mg/dL
Hgb A1c MFr Bld: 7.3 % — ABNORMAL HIGH (ref 4.8–5.6)

## 2022-01-10 ENCOUNTER — Other Ambulatory Visit: Payer: Medicare HMO

## 2022-01-11 LAB — PTH, INTACT AND CALCIUM
Calcium: 10.3 mg/dL — ABNORMAL HIGH (ref 8.6–10.2)
PTH: 31 pg/mL (ref 15–65)

## 2022-01-11 NOTE — Progress Notes (Signed)
Please let patient know that his calcium is improved and his parathyroid hormone was normal.  We will recheck these at future visits.

## 2022-02-01 NOTE — Progress Notes (Unsigned)
   There were no vitals taken for this visit.   Subjective:    Patient ID: Ryan Kirby, male    DOB: 03-05-1941, 81 y.o.   MRN: [id number]  HPI: Ryan Kirby is a 81 y.o. male  No chief complaint on file.  HYPERTENSION Hypertension status: {Blank single:19197::"controlled","uncontrolled","better","worse","exacerbated","stable"}  Satisfied with current treatment? {Blank single:19197::"yes","no"} Duration of hypertension: {Blank single:19197::"chronic","months","years"} BP monitoring frequency:  {Blank single:19197::"not checking","rarely","daily","weekly","monthly","a few times a day","a few times a week","a few times a month"} BP range:  BP medication side effects:  {Blank single:19197::"yes","no"} Medication compliance: {Blank single:19197::"excellent compliance","good compliance","fair compliance","poor compliance"} Previous BP meds:{Blank NOBSJGGE:36629::"UTML","YYTKPTWSFK","CLEXNTZGYF/VCBSWHQPRF","FMBWGYKZ","LDJTTSVXBL","TJQZESPQZR/AQTM","AUQJFHLKTG (bystolic)","carvedilol","chlorthalidone","clonidine","diltiazem","exforge HCT","HCTZ","irbesartan (avapro)","labetalol","lisinopril","lisinopril-HCTZ","losartan (cozaar)","methyldopa","nifedipine","olmesartan (benicar)","olmesartan-HCTZ","quinapril","ramipril","spironalactone","tekturna","valsartan","valsartan-HCTZ","verapamil"} Aspirin: {Blank single:19197::"yes","no"} Recurrent headaches: {Blank single:19197::"yes","no"} Visual changes: {Blank single:19197::"yes","no"} Palpitations: {Blank single:19197::"yes","no"} Dyspnea: {Blank single:19197::"yes","no"} Chest pain: {Blank single:19197::"yes","no"} Lower extremity edema: {Blank single:19197::"yes","no"} Dizzy/lightheaded: {Blank single:19197::"yes","no"}  Relevant past medical, surgical, family and social history reviewed and updated as indicated. Interim medical history since our last visit reviewed. Allergies and medications reviewed and updated.  Review of Systems  Per HPI  unless specifically indicated above     Objective:    There were no vitals taken for this visit.  Wt Readings from Last 3 Encounters:  01/01/22 181 lb 3.2 oz (82.2 kg)  10/04/21 181 lb 3.2 oz (82.2 kg)  07/17/21 176 lb 12.8 oz (80.2 kg)    Physical Exam  Results for orders placed or performed in visit on 01/10/22  PTH, Intact and Calcium  Result Value Ref Range   Calcium 10.3 (H) 8.6 - 10.2 mg/dL   PTH 31 15 - 65 pg/mL   PTH Interp Comment       Assessment & Plan:   Problem List Items Addressed This Visit       Cardiovascular and Mediastinum   Hypertension associated with diabetes ([place]) - Primary     Follow up plan: No follow-ups on file.

## 2022-02-02 ENCOUNTER — Encounter: Payer: Self-pay | Admitting: Nurse Practitioner

## 2022-02-02 ENCOUNTER — Ambulatory Visit (INDEPENDENT_AMBULATORY_CARE_PROVIDER_SITE_OTHER): Payer: Medicare HMO | Admitting: Nurse Practitioner

## 2022-02-02 VITALS — BP 120/69 | HR 73 | Temp 98.6°F | Wt 181.4 lb

## 2022-02-02 DIAGNOSIS — E1159 Type 2 diabetes mellitus with other circulatory complications: Secondary | ICD-10-CM | POA: Diagnosis not present

## 2022-02-02 DIAGNOSIS — I152 Hypertension secondary to endocrine disorders: Secondary | ICD-10-CM

## 2022-02-02 NOTE — Assessment & Plan Note (Signed)
Chronic.  Controlled.  Continue with current medication regimen.  Advised patient that he should avoid mucinex when sick.  Recommend Coricidin HBP.   Return to clinic in 2 months for reevaluation.  Call sooner if concerns arise.

## 2022-02-15 DIAGNOSIS — Z0100 Encounter for examination of eyes and vision without abnormal findings: Secondary | ICD-10-CM | POA: Diagnosis not present

## 2022-02-15 DIAGNOSIS — H2513 Age-related nuclear cataract, bilateral: Secondary | ICD-10-CM | POA: Diagnosis not present

## 2022-02-15 DIAGNOSIS — D3131 Benign neoplasm of right choroid: Secondary | ICD-10-CM | POA: Diagnosis not present

## 2022-02-15 DIAGNOSIS — E119 Type 2 diabetes mellitus without complications: Secondary | ICD-10-CM | POA: Diagnosis not present

## 2022-02-15 LAB — HM DIABETES EYE EXAM

## 2022-02-19 ENCOUNTER — Telehealth: Payer: Self-pay

## 2022-02-19 ENCOUNTER — Telehealth: Payer: Medicare HMO

## 2022-02-19 NOTE — Telephone Encounter (Signed)
  Care Management   Follow Up Note   02/19/2022 Name: Ryan Kirby MRN: [id number] DOB: [birth year]   Referred by: Jon Billings, NP Reason for referral : Chronic Care Management   An unsuccessful telephone outreach was attempted today. The patient was referred to the case management team for assistance with care management and care coordination.   Follow Up Plan:  No answer for 02/19/22 tel visit. Left message for CB. Care guide to RS if call back not received.   Madelin Rear, PharmD, [place] Clinical Pharmacist  [facility]  [phone]

## 2022-04-03 NOTE — Progress Notes (Signed)
There were no vitals taken for this visit.   Subjective:    Patient ID: Ryan Kirby, male    DOB: 15-Dec-1940, 81 y.o.   MRN: [id number]  HPI: Ryan Kirby is a 81 y.o. male  No chief complaint on file.  HYPERTENSION / HYPERLIPIDEMIA Satisfied with current treatment? no Duration of hypertension: years BP monitoring frequency: not checking BP range:  BP medication side effects: no Past BP meds: amlodipine and benazepril/HCTZ Duration of hyperlipidemia: years Cholesterol medication side effects: no Cholesterol supplements: none Past cholesterol medications: pravastatin (pravachol) Medication compliance: excellent compliance Aspirin: no Recent stressors: no Recurrent headaches: no Visual changes: no Palpitations: no Dyspnea: no Chest pain: no Lower extremity edema: no Dizzy/lightheaded: no  DIABETES Hypoglycemic episodes:no Polydipsia/polyuria: no Visual disturbance: no Chest pain: no Paresthesias: no Glucose Monitoring: no  Accucheck frequency: Not Checking  Fasting glucose:  Post prandial:  Evening:  Before meals: Taking Insulin?: no  Long acting insulin:  Short acting insulin: Blood Pressure Monitoring: not checking Retinal Examination: Up to Date Foot Exam: Up to Date Diabetic Education: Not Completed Pneumovax: Up to Date Influenza: Up to Date Aspirin: yes  UPPER RESPIRATORY TRACT INFECTION Worst symptom: symptoms started last wednesday Fever: no Cough: yes Shortness of breath: no Wheezing: no Chest pain: yes, with cough Chest tightness: no Chest congestion: no Nasal congestion: yes Runny nose: yes Post nasal drip: yes Sneezing: yes Sore throat: no Swollen glands: no Sinus pressure: no Headache: no Face pain: no Toothache: no Ear pain: no bilateral Ear pressure: no bilateral Eyes red/itching:no Eye drainage/crusting: yes  Vomiting: no Rash: no Fatigue: yes Sick contacts: no Strep contacts: no  Context: stable Recurrent  sinusitis: no Relief with OTC cold/cough medications: no  Treatments attempted: mucinex    Relevant past medical, surgical, family and social history reviewed and updated as indicated. Interim medical history since our last visit reviewed. Allergies and medications reviewed and updated.  Review of Systems  Constitutional:  Positive for fatigue. Negative for fever.  HENT:  Positive for congestion, postnasal drip, rhinorrhea and sneezing. Negative for ear pain, sinus pressure, sinus pain and sore throat.   Eyes:  Negative for visual disturbance.  Respiratory:  Positive for cough. Negative for chest tightness, shortness of breath and wheezing.   Cardiovascular:  Negative for chest pain, palpitations and leg swelling.  Gastrointestinal:  Negative for vomiting.  Endocrine: Negative for polydipsia and polyuria.  Skin:  Negative for rash.  Neurological:  Negative for dizziness, light-headedness, numbness and headaches.   Per HPI unless specifically indicated above     Objective:    There were no vitals taken for this visit.  Wt Readings from Last 3 Encounters:  02/02/22 181 lb 6.4 oz (82.3 kg)  01/01/22 181 lb 3.2 oz (82.2 kg)  10/04/21 181 lb 3.2 oz (82.2 kg)    Physical Exam Vitals and nursing note reviewed.  Constitutional:      General: He is not in acute distress.    Appearance: Normal appearance. He is not ill-appearing, toxic-appearing or diaphoretic.  HENT:     Head: Normocephalic.     Right Ear: Tympanic membrane and external ear normal.     Left Ear: Tympanic membrane and external ear normal.     Nose: Congestion and rhinorrhea present.     Mouth/Throat:     Mouth: Mucous membranes are moist.     Pharynx: Posterior oropharyngeal erythema present. No oropharyngeal exudate.  Eyes:     General:  Right eye: No discharge.        Left eye: No discharge.     Extraocular Movements: Extraocular movements intact.     Conjunctiva/sclera: Conjunctivae normal.     Pupils:  Pupils are equal, round, and reactive to light.  Cardiovascular:     Rate and Rhythm: Normal rate and regular rhythm.     Heart sounds: No murmur heard. Pulmonary:     Effort: Pulmonary effort is normal. No respiratory distress.     Breath sounds: Normal breath sounds. No wheezing, rhonchi or rales.  Abdominal:     General: Abdomen is flat. Bowel sounds are normal.  Musculoskeletal:     Cervical back: Normal range of motion and neck supple.  Skin:    General: Skin is warm and dry.     Capillary Refill: Capillary refill takes less than 2 seconds.  Neurological:     General: No focal deficit present.     Mental Status: He is alert and oriented to person, place, and time.  Psychiatric:        Mood and Affect: Mood normal.        Behavior: Behavior normal.        Thought Content: Thought content normal.        Judgment: Judgment normal.    Results for orders placed or performed in visit on 03/21/22  HM DIABETES EYE EXAM  Result Value Ref Range   HM Diabetic Eye Exam No Retinopathy No Retinopathy      Assessment & Plan:   Problem List Items Addressed This Visit   None    Follow up plan: No follow-ups on file.

## 2022-04-04 ENCOUNTER — Encounter: Payer: Self-pay | Admitting: Nurse Practitioner

## 2022-04-04 ENCOUNTER — Ambulatory Visit (INDEPENDENT_AMBULATORY_CARE_PROVIDER_SITE_OTHER): Payer: Medicare HMO | Admitting: Nurse Practitioner

## 2022-04-04 VITALS — BP 127/70 | HR 75 | Temp 98.4°F | Wt 182.1 lb

## 2022-04-04 DIAGNOSIS — E1159 Type 2 diabetes mellitus with other circulatory complications: Secondary | ICD-10-CM

## 2022-04-04 DIAGNOSIS — I2510 Atherosclerotic heart disease of native coronary artery without angina pectoris: Secondary | ICD-10-CM | POA: Diagnosis not present

## 2022-04-04 DIAGNOSIS — E1122 Type 2 diabetes mellitus with diabetic chronic kidney disease: Secondary | ICD-10-CM | POA: Diagnosis not present

## 2022-04-04 DIAGNOSIS — E1169 Type 2 diabetes mellitus with other specified complication: Secondary | ICD-10-CM

## 2022-04-04 DIAGNOSIS — M25551 Pain in right hip: Secondary | ICD-10-CM

## 2022-04-04 DIAGNOSIS — N183 Chronic kidney disease, stage 3 unspecified: Secondary | ICD-10-CM

## 2022-04-04 DIAGNOSIS — E785 Hyperlipidemia, unspecified: Secondary | ICD-10-CM

## 2022-04-04 DIAGNOSIS — E119 Type 2 diabetes mellitus without complications: Secondary | ICD-10-CM

## 2022-04-04 DIAGNOSIS — I152 Hypertension secondary to endocrine disorders: Secondary | ICD-10-CM

## 2022-04-04 DIAGNOSIS — I739 Peripheral vascular disease, unspecified: Secondary | ICD-10-CM

## 2022-04-04 DIAGNOSIS — I2583 Coronary atherosclerosis due to lipid rich plaque: Secondary | ICD-10-CM

## 2022-04-04 NOTE — Assessment & Plan Note (Signed)
Stable, recheck lipids and continue current regimen.

## 2022-04-04 NOTE — Assessment & Plan Note (Signed)
Chronic.  Controlled.  Last A1c was 7.3.  Continue with current medication regimen.  Labs ordered today.  Return to clinic in 3 months for reevaluation.  Call sooner if concerns arise.

## 2022-04-04 NOTE — Assessment & Plan Note (Signed)
Chronic.  Controlled.  Continue with current medication regimen.  Return to clinic in 3 months for reevaluation.  Call sooner if concerns arise.  

## 2022-04-04 NOTE — Assessment & Plan Note (Signed)
Labs ordered at visit today.  Will make recommendations based on lab results.   

## 2022-04-04 NOTE — Assessment & Plan Note (Signed)
Chronic.  Controlled.  Continue with current medication regimen on Pravastatin 40mg daily.  Labs ordered today.  Return to clinic in 3 months for reevaluation.  Call sooner if concerns arise.  

## 2022-04-05 LAB — COMPREHENSIVE METABOLIC PANEL
ALT: 8 IU/L (ref 0–44)
AST: 18 IU/L (ref 0–40)
Albumin/Globulin Ratio: 1.9 (ref 1.2–2.2)
Albumin: 4.1 g/dL (ref 3.8–4.8)
Alkaline Phosphatase: 73 IU/L (ref 44–121)
BUN/Creatinine Ratio: 8 — ABNORMAL LOW (ref 10–24)
BUN: 8 mg/dL (ref 8–27)
Bilirubin Total: 0.4 mg/dL (ref 0.0–1.2)
CO2: 23 mmol/L (ref 20–29)
Calcium: 9.9 mg/dL (ref 8.6–10.2)
Chloride: 101 mmol/L (ref 96–106)
Creatinine, Ser: 1.02 mg/dL (ref 0.76–1.27)
Globulin, Total: 2.2 g/dL (ref 1.5–4.5)
Glucose: 236 mg/dL — ABNORMAL HIGH (ref 70–99)
Potassium: 3.5 mmol/L (ref 3.5–5.2)
Sodium: 141 mmol/L (ref 134–144)
Total Protein: 6.3 g/dL (ref 6.0–8.5)
eGFR: 74 mL/min/{1.73_m2} (ref >59)

## 2022-04-05 LAB — HEMOGLOBIN A1C
Est. average glucose Bld gHb Est-mCnc: 157 mg/dL
Hgb A1c MFr Bld: 7.1 % — ABNORMAL HIGH (ref 4.8–5.6)

## 2022-04-05 LAB — LIPID PANEL
Chol/HDL Ratio: 3.1 ratio (ref 0.0–5.0)
Cholesterol, Total: 139 mg/dL (ref 100–199)
HDL: 45 mg/dL (ref >39)
LDL Chol Calc (NIH): 65 mg/dL (ref 0–99)
Triglycerides: 169 mg/dL — ABNORMAL HIGH (ref 0–149)
VLDL Cholesterol Cal: 29 mg/dL (ref 5–40)

## 2022-04-05 NOTE — Progress Notes (Signed)
Please let patient know that his lab work looks good.  A1c is well controlled at 7.1.  He should continue with his current medication regimen.  No other concerns at this time.  Follow up as discussed.

## 2022-04-11 DIAGNOSIS — M1711 Unilateral primary osteoarthritis, right knee: Secondary | ICD-10-CM | POA: Diagnosis not present

## 2022-04-11 DIAGNOSIS — M1388 Other specified arthritis, other site: Secondary | ICD-10-CM | POA: Diagnosis not present

## 2022-04-18 ENCOUNTER — Ambulatory Visit (INDEPENDENT_AMBULATORY_CARE_PROVIDER_SITE_OTHER): Payer: Medicare HMO | Admitting: *Deleted

## 2022-04-18 DIAGNOSIS — Z0000 Encounter for general adult medical examination without abnormal findings: Secondary | ICD-10-CM | POA: Diagnosis not present

## 2022-04-18 NOTE — Patient Instructions (Signed)
Ryan Kirby , Thank you for taking time to come for your Medicare Wellness Visit. I appreciate your ongoing commitment to your health goals. Please review the following plan we discussed and let me know if I can assist you in the future.   Screening recommendations/referrals: Colonoscopy: no longer required Recommended yearly ophthalmology/optometry visit for glaucoma screening and checkup Recommended yearly dental visit for hygiene and checkup  Vaccinations: Influenza vaccine: up to date Pneumococcal vaccine: up to date Tdap vaccine: up to date Shingles vaccine: Education provided    Advanced directives: Education provided  Conditions/risks identified:   Next appointment: 07-05-2022 @ 9:00  [facility] 80 Years and Older, Male Preventive care refers to lifestyle choices and visits with your health care provider that can promote health and wellness. What does preventive care include? A yearly physical exam. This is also called an annual well check. Dental exams once or twice a year. Routine eye exams. Ask your health care provider how often you should have your eyes checked. Personal lifestyle choices, including: Daily care of your teeth and gums. Regular physical activity. Eating a healthy diet. Avoiding tobacco and drug use. Limiting alcohol use. Practicing safe sex. Taking low doses of aspirin every day. Taking vitamin and mineral supplements as recommended by your health care provider. What happens during an annual well check? The services and screenings done by your health care provider during your annual well check will depend on your age, overall health, lifestyle risk factors, and family history of disease. Counseling  Your health care provider may ask you questions about your: Alcohol use. Tobacco use. Drug use. Emotional well-being. Home and relationship well-being. Sexual activity. Eating habits. History of falls. Memory and ability to  understand (cognition). Work and work Statistician. Screening  You may have the following tests or measurements: Height, weight, and BMI. Blood pressure. Lipid and cholesterol levels. These may be checked every 5 years, or more frequently if you are over 39 years old. Skin check. Lung cancer screening. You may have this screening every year starting at age 80 if you have a 30-pack-year history of smoking and currently smoke or have quit within the past 15 years. Fecal occult blood test (FOBT) of the stool. You may have this test every year starting at age 80. Flexible sigmoidoscopy or colonoscopy. You may have a sigmoidoscopy every 5 years or a colonoscopy every 10 years starting at age 80. Prostate cancer screening. Recommendations will vary depending on your family history and other risks. Hepatitis C blood test. Hepatitis B blood test. Sexually transmitted disease (STD) testing. Diabetes screening. This is done by checking your blood sugar (glucose) after you have not eaten for a while (fasting). You may have this done every 1-3 years. Abdominal aortic aneurysm (AAA) screening. You may need this if you are a current or former smoker. Osteoporosis. You may be screened starting at age 94 if you are at high risk. Talk with your health care provider about your test results, treatment options, and if necessary, the need for more tests. Vaccines  Your health care provider may recommend certain vaccines, such as: Influenza vaccine. This is recommended every year. Tetanus, diphtheria, and acellular pertussis (Tdap, Td) vaccine. You may need a Td booster every 10 years. Zoster vaccine. You may need this after age 34. Pneumococcal 13-valent conjugate (PCV13) vaccine. One dose is recommended after age 46. Pneumococcal polysaccharide (PPSV23) vaccine. One dose is recommended after age 12. Talk to your health care provider  about which screenings and vaccines you need and how often you need them. This  information is not intended to replace advice given to you by your health care provider. Make sure you discuss any questions you have with your health care provider. Document Released: 09/16/2015 Document Revised: 05/09/2016 Document Reviewed: 06/21/2015 Elsevier Interactive Patient Education  2017 [employer] Prevention in the Home Falls can cause injuries. They can happen to people of all ages. There are many things you can do to make your home safe and to help prevent falls. What can I do on the outside of my home? Regularly fix the edges of walkways and driveways and fix any cracks. Remove anything that might make you trip as you walk through a door, such as a raised step or threshold. Trim any bushes or trees on the path to your home. Use bright outdoor lighting. Clear any walking paths of anything that might make someone trip, such as rocks or tools. Regularly check to see if handrails are loose or broken. Make sure that both sides of any steps have handrails. Any raised decks and porches should have guardrails on the edges. Have any leaves, snow, or ice cleared regularly. Use sand or salt on walking paths during winter. Clean up any spills in your garage right away. This includes oil or grease spills. What can I do in the bathroom? Use night lights. Install grab bars by the toilet and in the tub and shower. Do not use towel bars as grab bars. Use non-skid mats or decals in the tub or shower. If you need to sit down in the shower, use a plastic, non-slip stool. Keep the floor dry. Clean up any water that spills on the floor as soon as it happens. Remove soap buildup in the tub or shower regularly. Attach bath mats securely with double-sided non-slip rug tape. Do not have throw rugs and other things on the floor that can make you trip. What can I do in the bedroom? Use night lights. Make sure that you have a light by your bed that is easy to reach. Do not use any sheets or  blankets that are too big for your bed. They should not hang down onto the floor. Have a firm chair that has side arms. You can use this for support while you get dressed. Do not have throw rugs and other things on the floor that can make you trip. What can I do in the kitchen? Clean up any spills right away. Avoid walking on wet floors. Keep items that you use a lot in easy-to-reach places. If you need to reach something above you, use a strong step stool that has a grab bar. Keep electrical cords out of the way. Do not use floor polish or wax that makes floors slippery. If you must use wax, use non-skid floor wax. Do not have throw rugs and other things on the floor that can make you trip. What can I do with my stairs? Do not leave any items on the stairs. Make sure that there are handrails on both sides of the stairs and use them. Fix handrails that are broken or loose. Make sure that handrails are as long as the stairways. Check any carpeting to make sure that it is firmly attached to the stairs. Fix any carpet that is loose or worn. Avoid having throw rugs at the top or bottom of the stairs. If you do have throw rugs, attach them to the floor  with carpet tape. Make sure that you have a light switch at the top of the stairs and the bottom of the stairs. If you do not have them, ask someone to add them for you. What else can I do to help prevent falls? Wear shoes that: Do not have high heels. Have rubber bottoms. Are comfortable and fit you well. Are closed at the toe. Do not wear sandals. If you use a stepladder: Make sure that it is fully opened. Do not climb a closed stepladder. Make sure that both sides of the stepladder are locked into place. Ask someone to hold it for you, if possible. Clearly mark and make sure that you can see: Any grab bars or handrails. First and last steps. Where the edge of each step is. Use tools that help you move around (mobility aids) if they are  needed. These include: Canes. Walkers. Scooters. Crutches. Turn on the lights when you go into a dark area. Replace any light bulbs as soon as they burn out. Set up your furniture so you have a clear path. Avoid moving your furniture around. If any of your floors are uneven, fix them. If there are any pets around you, be aware of where they are. Review your medicines with your doctor. Some medicines can make you feel dizzy. This can increase your chance of falling. Ask your doctor what other things that you can do to help prevent falls. This information is not intended to replace advice given to you by your health care provider. Make sure you discuss any questions you have with your health care provider. Document Released: 06/16/2009 Document Revised: 01/26/2016 Document Reviewed: 09/24/2014 Elsevier Interactive Patient Education  2017 [employer].

## 2022-04-18 NOTE — Progress Notes (Signed)
Subjective:   Ryan Kirby is a 81 y.o. male who presents for Medicare Annual/Subsequent preventive examination.  I connected with  [name] on 04/18/22 by a telephone enabled telemedicine application and verified that I am speaking with the correct person using two identifiers.   I discussed the limitations of evaluation and management by telemedicine. The patient expressed understanding and agreed to proceed.  Patient location: home  Provider location: Tele-Health-home    Review of Systems     Cardiac Risk Factors include: advanced age (>71mn, >>31women);diabetes mellitus;male gender;sedentary lifestyle;obesity (BMI >30kg/m2);hypertension     Objective:    Today's Vitals   There is no height or weight on file to calculate BMI.     04/18/2022    8:32 AM 04/17/2021    8:18 AM 05/26/2020    4:22 PM 04/15/2020    8:32 AM 02/11/2019   10:11 AM 11/03/2018    9:58 PM 10/29/2018    5:32 PM  Advanced Directives  Does Patient Have a Medical Advance Directive? No No Yes No No No No  Would patient like information on creating a medical advance directive? No - Patient declined          Current Medications (verified) Outpatient Encounter Medications as of 04/18/2022  Medication Sig   acetaminophen (TYLENOL) 500 MG tablet Take 500 mg by mouth every 6 (six) hours as needed.   amLODipine (NORVASC) 10 MG tablet TAKE 1 TABLET EVERY DAY   benazepril-hydrochlorthiazide (LOTENSIN HCT) 20-12.5 MG tablet TAKE 1 TABLET EVERY DAY   carvedilol (COREG) 25 MG tablet TAKE 2 TABLETS TWICE DAILY   cloNIDine (CATAPRES) 0.3 MG tablet TAKE 1 TABLET TWICE DAILY   docusate sodium (COLACE) 100 MG capsule Take 100 mg by mouth every other day.    doxazosin (CARDURA) 2 MG tablet TAKE 1 TABLET EVERY MORNING   fluticasone (FLONASE) 50 MCG/ACT nasal spray Place 2 sprays into both nostrils daily.   furosemide (LASIX) 40 MG tablet TAKE 1 TABLET EVERY DAY   glipiZIDE (GLUCOTROL) 5 MG tablet Take 1 tablet (5  mg total) by mouth 2 (two) times daily before a meal.   metFORMIN (GLUCOPHAGE) 500 MG tablet Take 2 tablets (1,000 mg total) by mouth 2 (two) times daily.   potassium chloride SA (KLOR-CON) 20 MEQ tablet TAKE 1 TABLET EVERY DAY   pravastatin (PRAVACHOL) 40 MG tablet TAKE 1 TABLET AT BEDTIME   Blood Glucose Monitoring Suppl (ACCU-CHEK GUIDE ME) w/Device KIT  (Patient not taking: Reported on 02/02/2022)   omeprazole (PRILOSEC OTC) 20 MG tablet Take 20 mg by mouth as needed.  (Patient not taking: Reported on 01/01/2022)   No facility-administered encounter medications on file as of 04/18/2022.    Allergies (verified) Oxycontin [oxycodone]   History: Past Medical History:  Diagnosis Date   Allergy    Arthritis    CAD (coronary artery disease)    Chronic kidney disease    pt is not on dialysis   Diabetes mellitus without complication ([place]    GERD (gastroesophageal reflux disease)    Hyperlipidemia    Hypertension    Past Surgical History:  Procedure Laterality Date   BOWEL RESECTION  2004   CORONARY ARTERY BYPASS GRAFT     FINGER AMPUTATION Left 2011   5th   INGUINAL HERNIA REPAIR Right 06/01/2020   Procedure: HERNIA REPAIR INGUINAL ADULT, open;  Surgeon: CFredirick Maudlin MD;  Location: [facility] ORS;  Service: General;  Laterality: Right;   KNEE ARTHROSCOPY Right 09/12/2015  Procedure: ARTHROSCOPY KNEE, LATERAL MENISECTOMY, CHONDROPLASTY;  Surgeon: Dereck Leep, MD;  Location: [facility] ORS;  Service: Orthopedics;  Laterality: Right;   WISDOM TOOTH EXTRACTION     Family History  Problem Relation Age of Onset   Arthritis Mother    Hypertension Mother    Breast cancer Mother    Arthritis Father    Hypertension Father    Colon cancer Father    Social History   Socioeconomic History   Marital status: Single    Spouse name: Not on file   Number of children: Not on file   Years of education: Not on file   Highest education level: 12th grade  Occupational History   Occupation: retired   Tobacco Use   Smoking status: Never   Smokeless tobacco: Never  Vaping Use   Vaping Use: Never used  Substance and Sexual Activity   Alcohol use: No    Alcohol/week: 0.0 standard drinks of alcohol   Drug use: No   Sexual activity: Not Currently  Other Topics Concern   Not on file  Social History Narrative   Not on file   Social Determinants of Health   Financial Resource Strain: Low Risk  (04/18/2022)   Overall Financial Resource Strain (CARDIA)    Difficulty of Paying Living Expenses: Not hard at all  Food Insecurity: No Food Insecurity (04/18/2022)   Hunger Vital Sign    Worried About Running Out of Food in the Last Year: Never true    [place] in the Last Year: Never true  Transportation Needs: No Transportation Needs (04/18/2022)   PRAPARE - Hydrologist (Medical): No    Lack of Transportation (Non-Medical): No  Physical Activity: Inactive (04/18/2022)   Exercise Vital Sign    Days of Exercise per Week: 0 days    Minutes of Exercise per Session: 0 min  Stress: No Stress Concern Present (04/18/2022)   [place]    Feeling of Stress : Not at all  Social Connections: Unknown (04/18/2022)   Social Connection and Isolation Panel [NHANES]    Frequency of Communication with Friends and Family: Twice a week    Frequency of Social Gatherings with Friends and Family: Once a week    Attends Religious Services: More than 4 times per year    Active Member of [employer] or Organizations: No    Attends Music therapist: Never    Marital Status: Not on file    Tobacco Counseling Counseling given: Not Answered   Clinical Intake:  Pre-visit preparation completed: Yes  Pain : No/denies pain     Diabetes: Yes CBG done?: No Did pt. bring in CBG monitor from home?: No  How often do you need to have someone help you when you read instructions, pamphlets, or other written  materials from your doctor or pharmacy?: 1 - Never  Diabetic?  Yes  Nutrition Risk Assessment:  Has the patient had any N/V/D within the last 2 months?  No  Does the patient have any non-healing wounds?  No  Has the patient had any unintentional weight loss or weight gain?  No   Diabetes:  Is the patient diabetic?  Yes  If diabetic, was a CBG obtained today?  No  Did the patient bring in their glucometer from home?  No  How often do you monitor your CBG's? Does not check.   Financial Strains and Diabetes Management:  Are  you having any financial strains with the device, your supplies or your medication? No .  Does the patient want to be seen by Chronic Care Management for management of their diabetes?  No  Would the patient like to be referred to a Nutritionist or for Diabetic Management?  No   Diabetic Exams:  Diabetic Eye Examfor diabetic eye exam. Pt has been advised about the importance in completing this exam.  Diabetic Foot Exam:  Pt has been advised about the importance in completing this exam  Interpreter Needed?: No  Information entered by :: Leroy Kennedy LPN   Activities of Daily Living    04/18/2022    8:42 AM  In your present state of health, do you have any difficulty performing the following activities:  Hearing? 0  Vision? 0  Difficulty concentrating or making decisions? 0  Walking or climbing stairs? 1  Dressing or bathing? 0  Doing errands, shopping? 0  Preparing Food and eating ? N  Using the Toilet? N  In the past six months, have you accidently leaked urine? N  Do you have problems with loss of bowel control? N  Managing your Medications? N  Managing your Finances? N  Housekeeping or managing your Housekeeping? N    Patient Care Team: Jon Billings, NP as PCP - General (Nurse Practitioner) Guadalupe Maple, MD as PCP - Family Medicine (Family Medicine) Garrel Ridgel, [place] as Consulting Physician (Podiatry) Vladimir Faster, [facility] (Inactive)  as Pharmacist (Pharmacist)  Indicate any recent Medical Services you may have received from other than [facility] providers in the past year (date may be approximate).     Assessment:   This is a routine wellness examination for [place].  Hearing/Vision screen Hearing Screening - Comments:: No hearing aids Vision Screening - Comments:: [facility] Up to date Dr. Edison Pace  Dietary issues and exercise activities discussed: Current Exercise Habits: The patient does not participate in regular exercise at present, Exercise limited by: Other - see comments (balance issues)   Goals Addressed             This Visit's Progress    Patient Stated       Stay healthy       Depression Screen    04/18/2022    8:39 AM 04/04/2022    8:56 AM 02/02/2022   10:03 AM 01/01/2022    9:56 AM 10/04/2021   10:08 AM 07/04/2021    1:19 PM 04/17/2021    8:19 AM  PHQ 2/9 Scores  PHQ - 2 Score 0 0 0 0 0 0 0  PHQ- 9 Score  '1 3 3 2 2     ' Fall Risk    04/18/2022    8:32 AM 04/04/2022    8:55 AM 02/02/2022   10:03 AM 01/01/2022    9:56 AM 10/04/2021   10:08 AM  Fall Risk   Falls in the past year? '1 1 1 1 1  ' Number falls in past yr: 0 '1 1 1 1  ' Injury with Fall?  0 0 0   Risk for fall due to :  History of fall(s) History of fall(s) History of fall(s) History of fall(s)  Follow up Falls evaluation completed;Education provided;Falls prevention discussed Falls evaluation completed Falls evaluation completed Falls evaluation completed Falls evaluation completed    FALL RISK PREVENTION PERTAINING TO THE HOME:  Any stairs in or around the home? Yes  If so, are there any without handrails? No  Home free of loose throw  rugs in walkways, pet beds, electrical cords, etc? Yes  Adequate lighting in your home to reduce risk of falls? Yes   ASSISTIVE DEVICES UTILIZED TO PREVENT FALLS:  Life alert? No  Use of a cane, walker or w/c? No  Grab bars in the bathroom? No  Shower chair or bench in shower? No  Elevated toilet seat or  a handicapped toilet? No   TIMED UP AND GO:  Was the test performed? No .    Cognitive Function:        04/18/2022    8:34 AM 04/17/2021    8:21 AM 04/15/2020    8:37 AM 02/11/2019   10:12 AM 12/16/2017    8:31 AM  6CIT Screen  What Year? 0 points 0 points 0 points 0 points 0 points  What month? 0 points 0 points 0 points 0 points 0 points  What time? 0 points 0 points 0 points 0 points 0 points  Count back from 20  0 points 0 points 0 points 0 points  Months in reverse 0 points 0 points 0 points 0 points 0 points  Repeat phrase 2 points 0 points 0 points 0 points 0 points  Total Score  0 points 0 points 0 points 0 points    Immunizations Immunization History  Administered Date(s) Administered   Fluad Quad(high Dose 65+) 07/27/2019, 08/18/2020, 07/04/2021   Influenza, High Dose Seasonal PF 07/17/2016, 06/24/2017, 07/02/2018   Influenza,inj,Quad PF,6+ Mos 06/02/2015   PFIZER(Purple Top)SARS-COV-2 Vaccination 10/13/2019, 11/03/2019, 08/23/2020   Pneumococcal Conjugate-13 12/01/2015   Pneumococcal Polysaccharide-23 07/04/2021   Pneumococcal-Unspecified 07/01/2007   Td 05/25/2008, 07/02/2018    TDAP status: Up to date  Flu Vaccine status: Up to date  Pneumococcal vaccine status: Up to date  Covid-19 vaccine status: Information provided on how to obtain vaccines.   Qualifies for Shingles Vaccine? Yes   Zosta vax completed No   Shingrix Completed?: No.    Education has been provided regarding the importance of this vaccine. Patient has been advised to call insurance company to determine out of pocket expense if they have not yet received this vaccine. Advised may also receive vaccine at local pharmacy or Health Dept. Verbalized acceptance and understanding.  Screening Tests Health Maintenance  Topic Date Due   INFLUENZA VACCINE  04/03/2022   COVID-19 Vaccine (4 - Pfizer series) 05/04/2022 (Originally 10/18/2020)   Zoster Vaccines- Shingrix (1 of 2) 07/19/2022 (Originally  10/09/1991)   COLONOSCOPY (Pts 45-63yr Insurance coverage will need to be confirmed)  04/19/2023 (Originally 08/21/2018)   FOOT EXAM  10/04/2022   HEMOGLOBIN A1C  10/05/2022   OPHTHALMOLOGY EXAM  02/16/2023   TETANUS/TDAP  07/02/2028   Pneumonia Vaccine 80 Years old  Completed   HPV VACCINES  Aged Out    Health Maintenance  Health Maintenance Due  Topic Date Due   INFLUENZA VACCINE  04/03/2022    Colorectal cancer screening: No longer required.   Lung Cancer Screening: (Low Dose CT Chest recommended if Age 80-80years, 30 pack-year currently smoking OR have quit w/in 15years.) does not qualify.   Lung Cancer Screening Referral:   Additional Screening:  Hepatitis C Screening: does not qualify; Completed 2021  Vision Screening: Recommended annual ophthalmology exams for early detection of glaucoma and other disorders of the eye. Is the patient up to date with their annual eye exam?  Yes  Who is the provider or what is the name of the office in which the patient attends annual eye exams? Dr. KEdison PaceIf  pt is not established with a provider, would they like to be referred to a provider to establish care? No .   Dental Screening: Recommended annual dental exams for proper oral hygiene  Community Resource Referral / Chronic Care Management: CRR required this visit?  No   CCM required this visit?  No      Plan:     I have personally reviewed and noted the following in the patient's chart:   Medical and social history Use of alcohol, tobacco or illicit drugs  Current medications and supplements including opioid prescriptions. Patient is not currently taking opioid prescriptions. Functional ability and status Nutritional status Physical activity Advanced directives List of other physicians Hospitalizations, surgeries, and ER visits in previous 12 months Vitals Screenings to include cognitive, depression, and falls Referrals and appointments  In addition, I have reviewed  and discussed with patient certain preventive protocols, quality metrics, and best practice recommendations. A written personalized care plan for preventive services as well as general preventive health recommendations were provided to patient.     Leroy Kennedy, LPN   02/15/7091   Nurse Notes:

## 2022-04-20 ENCOUNTER — Ambulatory Visit: Payer: Medicare HMO

## 2022-04-23 ENCOUNTER — Other Ambulatory Visit: Payer: Self-pay | Admitting: Nurse Practitioner

## 2022-04-23 DIAGNOSIS — I10 Essential (primary) hypertension: Secondary | ICD-10-CM

## 2022-04-23 MED ORDER — DOXAZOSIN MESYLATE 2 MG PO TABS: 2.0000 mg | ORAL_TABLET | Freq: Every morning | ORAL | 1 refills | Status: DC

## 2022-04-23 NOTE — Telephone Encounter (Signed)
We don't have [facility] as a pharmacy.  We have [facility] and [facility]. Does he want it sent to [facility]?

## 2022-04-23 NOTE — Telephone Encounter (Signed)
[facility] please

## 2022-04-23 NOTE — Telephone Encounter (Signed)
Filled date 10/04/20 #90 with 0RF. Please advise.

## 2022-04-23 NOTE — Telephone Encounter (Signed)
PT came in and stated that he is out of Doxazosin Mesylate '2MG'$ .  He states that normally he has it auto-refilled through [facility].  Please advise.

## 2022-05-21 ENCOUNTER — Other Ambulatory Visit: Payer: Self-pay | Admitting: Nurse Practitioner

## 2022-05-21 DIAGNOSIS — I2510 Atherosclerotic heart disease of native coronary artery without angina pectoris: Secondary | ICD-10-CM

## 2022-05-21 NOTE — Telephone Encounter (Signed)
[facility] filled 10/04/2021 #90 0 RF.  Last seen on 04/04/2022. Upcoming appt on 07/05/2022. Please advise.

## 2022-05-21 NOTE — Telephone Encounter (Signed)
Attempted to call patient to find out of he is taking this medication, LVMTRC.

## 2022-05-21 NOTE — Telephone Encounter (Signed)
PT is requesting a refill on his Potassium he said that he is completely out of it.

## 2022-05-21 NOTE — Telephone Encounter (Signed)
Please find out if patient is actually taking this.  He hasn't filled it since February.

## 2022-05-23 MED ORDER — POTASSIUM CHLORIDE CRYS ER 20 MEQ PO TBCR: 20.0000 meq | EXTENDED_RELEASE_TABLET | Freq: Every day | ORAL | 1 refills | Status: DC

## 2022-05-23 NOTE — Telephone Encounter (Signed)
Patient states he is taking it, and he is now low on the medication.

## 2022-05-23 NOTE — Telephone Encounter (Signed)
He would like it to be sent to [facility]

## 2022-06-04 ENCOUNTER — Telehealth: Payer: Self-pay

## 2022-06-04 NOTE — Chronic Care Management (AMB) (Signed)
    Chronic Care Management Pharmacy Assistant   Name: Ryan Kirby  MRN: [id number] DOB: 1941-07-30   Reason for Encounter: Disease State-General    Recent office visits:  04/04/22 Ryan Billings, NP (HTN) Orders: Labs- Medication changes: Patient reported not taking Empagliflozin 25 mg  02/02/22 Ryan Billings, NP (HTN) Orders: none- Medication changes: Patient reported not taking Empagliflozin 25 mg  Recent consult visits:  None since last coordination Kirby  Hospital visits:  None in previous 6 months  Medications: Outpatient Encounter Medications as of 06/04/2022  Medication Sig   acetaminophen (TYLENOL) 500 MG tablet Take 500 mg by mouth every 6 (six) hours as needed.   amLODipine (NORVASC) 10 MG tablet TAKE 1 TABLET EVERY DAY   benazepril-hydrochlorthiazide (LOTENSIN HCT) 20-12.5 MG tablet TAKE 1 TABLET EVERY DAY   Blood Glucose Monitoring Suppl (ACCU-CHEK GUIDE ME) w/Device KIT  (Patient not taking: Reported on 02/02/2022)   carvedilol (COREG) 25 MG tablet TAKE 2 TABLETS TWICE DAILY   cloNIDine (CATAPRES) 0.3 MG tablet TAKE 1 TABLET TWICE DAILY   docusate sodium (COLACE) 100 MG capsule Take 100 mg by mouth every other day.    doxazosin (CARDURA) 2 MG tablet Take 1 tablet (2 mg total) by mouth every morning.   fluticasone (FLONASE) 50 MCG/ACT nasal spray Place 2 sprays into both nostrils daily.   furosemide (LASIX) 40 MG tablet TAKE 1 TABLET EVERY DAY   glipiZIDE (GLUCOTROL) 5 MG tablet Take 1 tablet (5 mg total) by mouth 2 (two) times daily before a meal.   metFORMIN (GLUCOPHAGE) 500 MG tablet Take 2 tablets (1,000 mg total) by mouth 2 (two) times daily.   omeprazole (PRILOSEC OTC) 20 MG tablet Take 20 mg by mouth as needed.  (Patient not taking: Reported on 01/01/2022)   potassium chloride SA (KLOR-CON M) 20 MEQ tablet Take 1 tablet (20 mEq total) by mouth daily.   pravastatin (PRAVACHOL) 40 MG tablet TAKE 1 TABLET AT BEDTIME   No facility-administered encounter  medications on file as of 06/04/2022.   Contacted Ryan Kirby for Ryan Kirby   Chart Review:  Have there been any documented new, changed, or discontinued medications since last visit? No (If yes, include name, dose, frequency, date) Has there been any documented recent hospitalizations or ED visits since last visit with Clinical Pharmacist? No Brief Summary (including medication and/or Diagnosis changes):   Adherence Review:  Does the Clinical Pharmacist Assistant have access to adherence rates? Yes Adherence rates for STAR metric medications (List medication(s)/day supply/ last 2 fill dates). Adherence rates for medications indicated for disease state being reviewed (List medication(s)/day supply/ last 2 fill dates). Does the patient have >5 day gap between last estimated fill dates for any of the above medications or other medication gaps? Yes Reason for medication gaps.   Disease State Questions:  Unsuccessful outbound Kirby made today to assist with:  General Assessment . 3rd Attempt, to reach patient.  A HIPAA compliant voice message was left requesting a return Kirby.  Instructed patient to Kirby back at  earliest convenience..    Care Gaps: Colonoscopy-08/21/13 Diabetic Foot Exam-10/04/21 Ophthalmology-02/15/22 Dexa Scan - NA Annual Well Visit - 04/18/22 Micro albumin-09/27/21 Hemoglobin A1c- 04/04/22  Star Rating Drugs: Benazepril-hctz 20-12.5 mg-last fill 10/08/20 90 ds Metformin 500 mg-last fill 08/04/21 30 ds Pravastatin 40 mg-last fill 10/08/20 90 ds  [place] [phone]

## 2022-06-20 ENCOUNTER — Telehealth: Payer: Self-pay | Admitting: Nurse Practitioner

## 2022-06-20 DIAGNOSIS — I10 Essential (primary) hypertension: Secondary | ICD-10-CM

## 2022-06-20 DIAGNOSIS — E119 Type 2 diabetes mellitus without complications: Secondary | ICD-10-CM

## 2022-06-20 MED ORDER — METFORMIN HCL 500 MG PO TABS: 1000.0000 mg | ORAL_TABLET | Freq: Two times a day (BID) | ORAL | 1 refills | Status: DC

## 2022-06-20 MED ORDER — FUROSEMIDE 40 MG PO TABS: 40.0000 mg | ORAL_TABLET | Freq: Every day | ORAL | 1 refills | Status: DC

## 2022-06-20 NOTE — Telephone Encounter (Signed)
Patient walked in requesting refills on Metformin and lasix.  Medications sent to the pharmcy.

## 2022-07-02 ENCOUNTER — Other Ambulatory Visit: Payer: Self-pay

## 2022-07-02 ENCOUNTER — Telehealth: Payer: Self-pay | Admitting: Nurse Practitioner

## 2022-07-02 NOTE — Telephone Encounter (Signed)
Patient requesting refill for Glipizide, came to the office and told front desk he is completely out. Last fill 08/04/2021 #60 no refills. Last seen in office on 04/04/2022, next office visit scheduled for 07/05/2022. Please advise.

## 2022-07-02 NOTE — Telephone Encounter (Signed)
Patient came into the office and stated that he has been out of his glipizide (glucotrol) Pleas advise.

## 2022-07-02 NOTE — Telephone Encounter (Signed)
I don't think patient has been taking this since it hasn't been prescribed to him since last December.  We can talk about it further at his next visit.

## 2022-07-02 NOTE — Telephone Encounter (Signed)
Refill med req sent to PCP

## 2022-07-04 NOTE — Progress Notes (Unsigned)
There were no vitals taken for this visit.   Subjective:    Patient ID: Ryan Kirby, male    DOB: 03/28/1941, 81 y.o.   MRN: [id number]  HPI: Ryan Kirby is a 81 y.o. male presenting on 07/05/2022 for comprehensive medical examination. Current medical complaints include:{Blank single:19197::"none","***"}  He currently lives with: Interim Problems from his last visit: {Blank single:19197::"yes","no"}  HYPERTENSION / HYPERLIPIDEMIA Satisfied with current treatment? no Duration of hypertension: years BP monitoring frequency: not checking BP range:  BP medication side effects: no Past BP meds: amlodipine and benazepril/HCTZ Duration of hyperlipidemia: years Cholesterol medication side effects: no Cholesterol supplements: none Past cholesterol medications: pravastatin (pravachol) Medication compliance: excellent compliance Aspirin: no Recent stressors: no Recurrent headaches: no Visual changes: no Palpitations: no Dyspnea: no Chest pain: no Lower extremity edema: no Dizzy/lightheaded: no  DIABETES Hypoglycemic episodes:no Polydipsia/polyuria: no Visual disturbance: no Chest pain: no Paresthesias: no Glucose Monitoring: no  Accucheck frequency: Not Checking  Fasting glucose:  Post prandial:  Evening:  Before meals: Taking Insulin?: no  Long acting insulin:  Short acting insulin: Blood Pressure Monitoring: not checking Retinal Examination: Up to Date Foot Exam: Up to Date Diabetic Education: Not Completed Pneumovax: Up to Date Influenza: Up to Date Aspirin: yes  HIP PAIN Patient presents to clinic with complain Hip pain that has been going on for awhile.  He has had cortisone injections in the past and feels like he is due for one.    Depression Screen done today and results listed below:     04/18/2022    8:39 AM 04/04/2022    8:56 AM 02/02/2022   10:03 AM 01/01/2022    9:56 AM 10/04/2021   10:08 AM  Depression screen PHQ 2/9  Decreased Interest 0 0 0 0 0   Down, Depressed, Hopeless 0 0 0 0 0  PHQ - 2 Score 0 0 0 0 0  Altered sleeping  0 1 1 0  Tired, decreased energy  _0 Change in appetite  0 0 0 0  Feeling bad or failure about yourself   0 0 0 0  Trouble concentrating  0 0 0 0  Moving slowly or fidgety/restless  0 _1 Suicidal thoughts  0 0 0 0  PHQ-9 Score  _2 Difficult doing work/chores  Somewhat difficult Not difficult at all Not difficult at all Not difficult at all    The patient {has/does not have:19849} a history of falls. I {did/did not:19850} complete a risk assessment for falls. A plan of care for falls {was/was not:19852} documented.   Past Medical History:  Past Medical History:  Diagnosis Date   Allergy    Arthritis    CAD (coronary artery disease)    Chronic kidney disease    pt is not on dialysis   Diabetes mellitus without complication ([place])    GERD (gastroesophageal reflux disease)    Hyperlipidemia    Hypertension     Surgical History:  Past Surgical History:  Procedure Laterality Date   BOWEL RESECTION  2004   CORONARY ARTERY BYPASS GRAFT     FINGER AMPUTATION Left 2011   5th   INGUINAL HERNIA REPAIR Right 06/01/2020   Procedure: HERNIA REPAIR INGUINAL ADULT, open;  Surgeon: Fredirick Maudlin, MD;  Location: [facility] ORS;  Service: General;  Laterality: Right;   KNEE ARTHROSCOPY Right 09/12/2015   Procedure: ARTHROSCOPY KNEE, LATERAL MENISECTOMY, CHONDROPLASTY;  Surgeon: Dereck Leep, MD;  Location: [facility] ORS;  Service: Orthopedics;  Laterality: Right;   WISDOM TOOTH EXTRACTION      Medications:  Current Outpatient Medications on File Prior to Visit  Medication Sig   acetaminophen (TYLENOL) 500 MG tablet Take 500 mg by mouth every 6 (six) hours as needed.   amLODipine (NORVASC) 10 MG tablet TAKE 1 TABLET EVERY DAY   benazepril-hydrochlorthiazide (LOTENSIN HCT) 20-12.5 MG tablet TAKE 1 TABLET EVERY DAY   Blood Glucose Monitoring Suppl (ACCU-CHEK GUIDE ME) w/Device KIT  (Patient not taking:  Reported on 02/02/2022)   carvedilol (COREG) 25 MG tablet TAKE 2 TABLETS TWICE DAILY   cloNIDine (CATAPRES) 0.3 MG tablet TAKE 1 TABLET TWICE DAILY   docusate sodium (COLACE) 100 MG capsule Take 100 mg by mouth every other day.    doxazosin (CARDURA) 2 MG tablet Take 1 tablet (2 mg total) by mouth every morning.   fluticasone (FLONASE) 50 MCG/ACT nasal spray Place 2 sprays into both nostrils daily.   furosemide (LASIX) 40 MG tablet Take 1 tablet (40 mg total) by mouth daily.   glipiZIDE (GLUCOTROL) 5 MG tablet Take 1 tablet (5 mg total) by mouth 2 (two) times daily before a meal.   metFORMIN (GLUCOPHAGE) 500 MG tablet Take 2 tablets (1,000 mg total) by mouth 2 (two) times daily.   omeprazole (PRILOSEC OTC) 20 MG tablet Take 20 mg by mouth as needed.  (Patient not taking: Reported on 01/01/2022)   potassium chloride SA (KLOR-CON M) 20 MEQ tablet Take 1 tablet (20 mEq total) by mouth daily.   pravastatin (PRAVACHOL) 40 MG tablet TAKE 1 TABLET AT BEDTIME   No current facility-administered medications on file prior to visit.    Allergies:  Allergies  Allergen Reactions   Oxycontin [Oxycodone] Nausea And Vomiting    Social History:  Social History   Socioeconomic History   Marital status: Single    Spouse name: Not on file   Number of children: Not on file   Years of education: Not on file   Highest education level: 12th grade  Occupational History   Occupation: retired  Tobacco Use   Smoking status: Never   Smokeless tobacco: Never  Vaping Use   Vaping Use: Never used  Substance and Sexual Activity   Alcohol use: No    Alcohol/week: 0.0 standard drinks of alcohol   Drug use: No   Sexual activity: Not Currently  Other Topics Concern   Not on file  Social History Narrative   Not on file   Social Determinants of Health   Financial Resource Strain: Low Risk  (04/18/2022)   Overall Financial Resource Strain (CARDIA)    Difficulty of Paying Living Expenses: Not hard at all  Food  Insecurity: No Food Insecurity (04/18/2022)   Hunger Vital Sign    Worried About Running Out of Food in the Last Year: Never true    [place] in the Last Year: Never true  Transportation Needs: No Transportation Needs (04/18/2022)   PRAPARE - Hydrologist (Medical): No    Lack of Transportation (Non-Medical): No  Physical Activity: Inactive (04/18/2022)   Exercise Vital Sign    Days of Exercise per Week: 0 days    Minutes of Exercise per Session: 0 min  Stress: No Stress Concern Present (04/18/2022)   [place]    Feeling of Stress : Not at all  Social Connections: Unknown (04/18/2022)   Social Connection and  Isolation Panel [NHANES]    Frequency of Communication with Friends and Family: Twice a week    Frequency of Social Gatherings with Friends and Family: Once a week    Attends Religious Services: More than 4 times per year    Active Member of [employer] or Organizations: No    Attends Archivist Meetings: Never    Marital Status: Not on file  Intimate Partner Violence: Not At Risk (04/18/2022)   Humiliation, Afraid, Rape, and Kick questionnaire    Fear of Current or Ex-Partner: No    Emotionally Abused: No    Physically Abused: No    Sexually Abused: No   Social History   Tobacco Use  Smoking Status Never  Smokeless Tobacco Never   Social History   Substance and Sexual Activity  Alcohol Use No   Alcohol/week: 0.0 standard drinks of alcohol    Family History:  Family History  Problem Relation Age of Onset   Arthritis Mother    Hypertension Mother    Breast cancer Mother    Arthritis Father    Hypertension Father    Colon cancer Father     Past medical history, surgical history, medications, allergies, family history and social history reviewed with patient today and changes made to appropriate areas of the chart.   ROS All other ROS negative except what is  listed above and in the HPI.      Objective:    There were no vitals taken for this visit.  Wt Readings from Last 3 Encounters:  04/04/22 182 lb 1.6 oz (82.6 kg)  02/02/22 181 lb 6.4 oz (82.3 kg)  01/01/22 181 lb 3.2 oz (82.2 kg)    Physical Exam  Results for orders placed or performed in visit on 04/04/22  Comp Met (CMET)  Result Value Ref Range   Glucose 236 (H) 70 - 99 mg/dL   BUN 8 8 - 27 mg/dL   Creatinine, Ser 1.02 0.76 - 1.27 mg/dL   eGFR 74 >59 mL/min/1.73   BUN/Creatinine Ratio 8 (L) 10 - 24   Sodium 141 134 - 144 mmol/L   Potassium 3.5 3.5 - 5.2 mmol/L   Chloride 101 96 - 106 mmol/L   CO2 23 20 - 29 mmol/L   Calcium 9.9 8.6 - 10.2 mg/dL   Total Protein 6.3 6.0 - 8.5 g/dL   Albumin 4.1 3.8 - 4.8 g/dL   Globulin, Total 2.2 1.5 - 4.5 g/dL   Albumin/Globulin Ratio 1.9 1.2 - 2.2   Bilirubin Total 0.4 0.0 - 1.2 mg/dL   Alkaline Phosphatase 73 44 - 121 IU/L   AST 18 0 - 40 IU/L   ALT 8 0 - 44 IU/L  Lipid Profile  Result Value Ref Range   Cholesterol, Total 139 100 - 199 mg/dL   Triglycerides 169 (H) 0 - 149 mg/dL   HDL 45 >39 mg/dL   VLDL Cholesterol Cal 29 5 - 40 mg/dL   LDL Chol Calc (NIH) 65 0 - 99 mg/dL   Chol/HDL Ratio 3.1 0.0 - 5.0 ratio  HgB A1c  Result Value Ref Range   Hgb A1c MFr Bld 7.1 (H) 4.8 - 5.6 %   Est. average glucose Bld gHb Est-mCnc 157 mg/dL      Assessment & Plan:   Problem List Items Addressed This Visit       Cardiovascular and Mediastinum   Hypertension associated with diabetes ([place])   CAD (coronary artery disease)   PVD (peripheral vascular disease) ([place]) -  Primary     Endocrine   Hyperlipidemia associated with type 2 diabetes mellitus ([place])   CKD stage 3 secondary to diabetes ([place])   Diabetes mellitus without complication ([place])     Discussed aspirin prophylaxis for myocardial infarction prevention and decision was {Blank single:19197::"it was not indicated","made to continue ASA","made to start ASA","made to stop ASA","that  we recommended ASA, and patient refused"}  LABORATORY TESTING:  Health maintenance labs ordered today as discussed above.   The natural history of prostate cancer and ongoing controversy regarding screening and potential treatment outcomes of prostate cancer has been discussed with the patient. The meaning of a false positive PSA and a false negative PSA has been discussed. He indicates understanding of the limitations of this screening test and wishes *** to proceed with screening PSA testing.   IMMUNIZATIONS:   - Tdap: Tetanus vaccination status reviewed: {tetanus status:315746}. - Influenza: {Blank single:19197::"Up to date","Administered today","Postponed to flu season","Refused","Given elsewhere"} - Pneumovax: {Blank single:19197::"Up to date","Administered today","Not applicable","Refused","Given elsewhere"} - Prevnar: {Blank single:19197::"Up to date","Administered today","Not applicable","Refused","Given elsewhere"} - COVID: {Blank single:19197::"Up to date","Administered today","Not applicable","Refused","Given elsewhere"} - HPV: {Blank single:19197::"Up to date","Administered today","Not applicable","Refused","Given elsewhere"} - Shingrix vaccine: {Blank single:19197::"Up to date","Administered today","Not applicable","Refused","Given elsewhere"}  SCREENING: - Colonoscopy: {Blank single:19197::"Up to date","Ordered today","Not applicable","Refused","Done elsewhere"}  Discussed with patient purpose of the colonoscopy is to detect colon cancer at curable precancerous or early stages   - AAA Screening: {Blank single:19197::"Up to date","Ordered today","Not applicable","Refused","Done elsewhere"}  -Hearing Test: {Blank single:19197::"Up to date","Ordered today","Not applicable","Refused","Done elsewhere"}  -Spirometry: {Blank single:19197::"Up to date","Ordered today","Not applicable","Refused","Done elsewhere"}   PATIENT COUNSELING:    Sexuality: Discussed sexually transmitted  diseases, partner selection, use of condoms, avoidance of unintended pregnancy  and contraceptive alternatives.   Advised to avoid cigarette smoking.  I discussed with the patient that most people either abstain from alcohol or drink within safe limits (<=14/week and <=4 drinks/occasion for males, <=7/weeks and <= 3 drinks/occasion for females) and that the risk for alcohol disorders and other health effects rises proportionally with the number of drinks per week and how often a drinker exceeds daily limits.  Discussed cessation/primary prevention of drug use and availability of treatment for abuse.   Diet: Encouraged to adjust caloric intake to maintain  or achieve ideal body weight, to reduce intake of dietary saturated fat and total fat, to limit sodium intake by avoiding high sodium foods and not adding table salt, and to maintain adequate dietary potassium and calcium preferably from fresh fruits, vegetables, and low-fat dairy products.    stressed the importance of regular exercise  Injury prevention: Discussed safety belts, safety helmets, smoke detector, smoking near bedding or upholstery.   Dental health: Discussed importance of regular tooth brushing, flossing, and dental visits.   Follow up plan: NEXT PREVENTATIVE PHYSICAL DUE IN 1 YEAR. No follow-ups on file.

## 2022-07-05 ENCOUNTER — Encounter: Payer: Self-pay | Admitting: Nurse Practitioner

## 2022-07-05 ENCOUNTER — Ambulatory Visit (INDEPENDENT_AMBULATORY_CARE_PROVIDER_SITE_OTHER): Payer: Medicare HMO | Admitting: Nurse Practitioner

## 2022-07-05 VITALS — BP 139/69 | HR 75 | Temp 98.5°F | Wt 186.1 lb

## 2022-07-05 DIAGNOSIS — E1122 Type 2 diabetes mellitus with diabetic chronic kidney disease: Secondary | ICD-10-CM | POA: Diagnosis not present

## 2022-07-05 DIAGNOSIS — Z0000 Encounter for general adult medical examination without abnormal findings: Secondary | ICD-10-CM | POA: Diagnosis not present

## 2022-07-05 DIAGNOSIS — E785 Hyperlipidemia, unspecified: Secondary | ICD-10-CM | POA: Diagnosis not present

## 2022-07-05 DIAGNOSIS — E1169 Type 2 diabetes mellitus with other specified complication: Secondary | ICD-10-CM

## 2022-07-05 DIAGNOSIS — Z23 Encounter for immunization: Secondary | ICD-10-CM | POA: Diagnosis not present

## 2022-07-05 DIAGNOSIS — E119 Type 2 diabetes mellitus without complications: Secondary | ICD-10-CM

## 2022-07-05 DIAGNOSIS — I2510 Atherosclerotic heart disease of native coronary artery without angina pectoris: Secondary | ICD-10-CM | POA: Diagnosis not present

## 2022-07-05 DIAGNOSIS — I152 Hypertension secondary to endocrine disorders: Secondary | ICD-10-CM

## 2022-07-05 DIAGNOSIS — E1159 Type 2 diabetes mellitus with other circulatory complications: Secondary | ICD-10-CM | POA: Diagnosis not present

## 2022-07-05 DIAGNOSIS — I739 Peripheral vascular disease, unspecified: Secondary | ICD-10-CM

## 2022-07-05 DIAGNOSIS — I2583 Coronary atherosclerosis due to lipid rich plaque: Secondary | ICD-10-CM

## 2022-07-05 DIAGNOSIS — N400 Enlarged prostate without lower urinary tract symptoms: Secondary | ICD-10-CM | POA: Diagnosis not present

## 2022-07-05 DIAGNOSIS — N183 Chronic kidney disease, stage 3 unspecified: Secondary | ICD-10-CM

## 2022-07-05 LAB — URINALYSIS, ROUTINE W REFLEX MICROSCOPIC
Bilirubin, UA: NEGATIVE
Ketones, UA: NEGATIVE
Leukocytes,UA: NEGATIVE
Nitrite, UA: NEGATIVE
Protein,UA: NEGATIVE
RBC, UA: NEGATIVE
Specific Gravity, UA: 1.015 (ref 1.005–1.030)
Urobilinogen, Ur: 0.2 mg/dL (ref 0.2–1.0)
pH, UA: 6 (ref 5.0–7.5)

## 2022-07-05 NOTE — Assessment & Plan Note (Signed)
Chronic.  Controlled.  Last A1c was 7.3.  Continue with current medication regimen.  Glipizide was prescribed one time in the past.  Discontinued from Glipizide due to well controlled A1c and risk for hypoglycemia. Labs ordered today.  Return to clinic in 3 months for reevaluation.  Call sooner if concerns arise.

## 2022-07-05 NOTE — Assessment & Plan Note (Signed)
Chronic. Stable, recheck lipids and continue current regimen.

## 2022-07-05 NOTE — Assessment & Plan Note (Signed)
Noted on exam with dermatitis.  No longer using the Triaminalone.  Will reassess at next visit.

## 2022-07-05 NOTE — Assessment & Plan Note (Signed)
Chronic.  Controlled.  Continue with current medication regimen.   Return to clinic in 3 months for reevaluation.  Labs ordered today.  Call sooner if concerns arise.

## 2022-07-05 NOTE — Assessment & Plan Note (Signed)
Chronic.  Controlled.  Continue with current medication regimen on Pravastatin '40mg'$  daily.  Labs ordered today.  Return to clinic in 3 months for reevaluation.  Call sooner if concerns arise.

## 2022-07-05 NOTE — Assessment & Plan Note (Signed)
Chronic.  Labs ordered at visit today.  Will make recommendations based on lab results.

## 2022-07-06 LAB — CBC WITH DIFFERENTIAL/PLATELET
Basophils Absolute: 0.1 10*3/uL (ref 0.0–0.2)
Basos: 1 %
EOS (ABSOLUTE): 0.4 10*3/uL (ref 0.0–0.4)
Eos: 6 %
Hematocrit: 40.8 % (ref 37.5–51.0)
Hemoglobin: 13.4 g/dL (ref 13.0–17.7)
Immature Grans (Abs): 0 10*3/uL (ref 0.0–0.1)
Immature Granulocytes: 0 %
Lymphocytes Absolute: 0.9 10*3/uL (ref 0.7–3.1)
Lymphs: 13 %
MCH: 31.2 pg (ref 26.6–33.0)
MCHC: 32.8 g/dL (ref 31.5–35.7)
MCV: 95 fL (ref 79–97)
Monocytes Absolute: 0.4 10*3/uL (ref 0.1–0.9)
Monocytes: 6 %
Neutrophils Absolute: 4.8 10*3/uL (ref 1.4–7.0)
Neutrophils: 74 %
Platelets: 217 10*3/uL (ref 150–450)
RBC: 4.3 x10E6/uL (ref 4.14–5.80)
RDW: 12.2 % (ref 11.6–15.4)
WBC: 6.6 10*3/uL (ref 3.4–10.8)

## 2022-07-06 LAB — LIPID PANEL
Chol/HDL Ratio: 2.8 ratio (ref 0.0–5.0)
Cholesterol, Total: 143 mg/dL (ref 100–199)
HDL: 51 mg/dL (ref >39)
LDL Chol Calc (NIH): 65 mg/dL (ref 0–99)
Triglycerides: 162 mg/dL — ABNORMAL HIGH (ref 0–149)
VLDL Cholesterol Cal: 27 mg/dL (ref 5–40)

## 2022-07-06 LAB — COMPREHENSIVE METABOLIC PANEL
ALT: 10 IU/L (ref 0–44)
AST: 16 IU/L (ref 0–40)
Albumin/Globulin Ratio: 2.2 (ref 1.2–2.2)
Albumin: 4.7 g/dL (ref 3.8–4.8)
Alkaline Phosphatase: 91 IU/L (ref 44–121)
BUN/Creatinine Ratio: 15 (ref 10–24)
BUN: 18 mg/dL (ref 8–27)
Bilirubin Total: 0.5 mg/dL (ref 0.0–1.2)
CO2: 25 mmol/L (ref 20–29)
Calcium: 11.1 mg/dL — ABNORMAL HIGH (ref 8.6–10.2)
Chloride: 95 mmol/L — ABNORMAL LOW (ref 96–106)
Creatinine, Ser: 1.22 mg/dL (ref 0.76–1.27)
Globulin, Total: 2.1 g/dL (ref 1.5–4.5)
Glucose: 256 mg/dL — ABNORMAL HIGH (ref 70–99)
Potassium: 3.8 mmol/L (ref 3.5–5.2)
Sodium: 135 mmol/L (ref 134–144)
Total Protein: 6.8 g/dL (ref 6.0–8.5)
eGFR: 60 mL/min/{1.73_m2} (ref >59)

## 2022-07-06 LAB — PSA: Prostate Specific Ag, Serum: 1.4 ng/mL (ref 0.0–4.0)

## 2022-07-06 LAB — HEMOGLOBIN A1C
Est. average glucose Bld gHb Est-mCnc: 171 mg/dL
Hgb A1c MFr Bld: 7.6 % — ABNORMAL HIGH (ref 4.8–5.6)

## 2022-07-06 LAB — TSH: TSH: 2.8 u[IU]/mL (ref 0.450–4.500)

## 2022-07-06 NOTE — Progress Notes (Signed)
Please let patient know that his lab work shows that his A1c increased from 7.1 to 7.6.  Make sure to avoid sugary drinks like cool aid, sweet tea and sodas.  Otherwise, his lab work looks good.  No other concerns at this time.

## 2022-08-01 ENCOUNTER — Other Ambulatory Visit: Payer: Self-pay

## 2022-08-01 DIAGNOSIS — I10 Essential (primary) hypertension: Secondary | ICD-10-CM

## 2022-08-01 MED ORDER — FUROSEMIDE 40 MG PO TABS: 40.0000 mg | ORAL_TABLET | Freq: Every day | ORAL | 1 refills | Status: DC

## 2022-08-01 NOTE — Telephone Encounter (Signed)
Patient needs RX sent to mail order pharmacy.

## 2022-08-13 ENCOUNTER — Telehealth: Payer: Self-pay

## 2022-08-13 NOTE — Chronic Care Management (AMB) (Signed)
Chronic Care Management Pharmacy Assistant   Name: Ryan Kirby  MRN: [id number] DOB: 1940/12/19   Reason for Encounter: Disease State-General    Recent office visits:  07/05/22 Ryan Billings, NP (Annual Exam) Orders placed: Labs; Medication changes: none   Recent consult visits:  None since last unsuccessful coordination call   Hospital visits:  None in previous 6 months  Medications: Outpatient Encounter Medications as of 08/13/2022  Medication Sig   acetaminophen (TYLENOL) 500 MG tablet Take 500 mg by mouth every 6 (six) hours as needed.   amLODipine (NORVASC) 10 MG tablet TAKE 1 TABLET EVERY DAY   benazepril-hydrochlorthiazide (LOTENSIN HCT) 20-12.5 MG tablet TAKE 1 TABLET EVERY DAY   Blood Glucose Monitoring Suppl (ACCU-CHEK GUIDE ME) w/Device KIT  (Patient not taking: Reported on 02/02/2022)   carvedilol (COREG) 25 MG tablet TAKE 2 TABLETS TWICE DAILY   cloNIDine (CATAPRES) 0.3 MG tablet TAKE 1 TABLET TWICE DAILY   docusate sodium (COLACE) 100 MG capsule Take 100 mg by mouth every other day.    doxazosin (CARDURA) 2 MG tablet Take 1 tablet (2 mg total) by mouth every morning.   fluticasone (FLONASE) 50 MCG/ACT nasal spray Place 2 sprays into both nostrils daily.   furosemide (LASIX) 40 MG tablet Take 1 tablet (40 mg total) by mouth daily.   metFORMIN (GLUCOPHAGE) 500 MG tablet Take 2 tablets (1,000 mg total) by mouth 2 (two) times daily.   omeprazole (PRILOSEC OTC) 20 MG tablet Take 20 mg by mouth as needed.   potassium chloride SA (KLOR-CON M) 20 MEQ tablet Take 1 tablet (20 mEq total) by mouth daily.   pravastatin (PRAVACHOL) 40 MG tablet TAKE 1 TABLET AT BEDTIME   No facility-administered encounter medications on file as of 08/13/2022.   Contacted Ryan Kirby for [employer] Review Call   Chart Review:  Have there been any documented new, changed, or discontinued medications since last visit? No (If yes, include name, dose, frequency, date) Has there been  any documented recent hospitalizations or ED visits since last visit with Clinical Pharmacist? No Brief Summary (including medication and/or Diagnosis changes):   Adherence Review:  Does the Clinical Pharmacist Assistant have access to adherence rates? Yes Adherence rates for STAR metric medications (List medication(s)/day supply/ last 2 fill dates). Adherence rates for medications indicated for disease state being reviewed (List medication(s)/day supply/ last 2 fill dates). Does the patient have >5 day gap between last estimated fill dates for any of the above medications or other medication gaps? Yes Reason for medication gaps. Do you receive your medications through PAP? No  If Yes, what is the status? Last received?   Disease State Questions:  Able to connect with Patient? Yes  Did patient have any problems with their health recently? Yes Note problems and Concerns:Patient stated that he is going to the bathroom more than usual. He is not sure if this is an issue or not.  Have you had any admissions or emergency room visits or worsening of your condition(s) since last visit? No Details of ED visit, hospital visit and/or worsening condition(s):  Have you had any visits with new specialists or providers since your last visit? No Explain:  Have you had any new health care problem(s) since your last visit? No New problem(s) reported:  Have you run out of any of your medications since you last spoke with clinical pharmacist? Yes What caused you to run out of your medications?Patient states that he has not had some of  his medications in over a year. He states that he is out of his furosemide. He states that he gets mediations from [facility] and they kept sending medications he had so much he told them to stop.  Are there any medications you are not taking as prescribed? No What kept you from taking your medications as prescribed?  Are you having any issues or side effects with your  medications? No Note of issues or side effects:  Do you have any other health concerns or questions you want to discuss with your Clinical Pharmacist before your next visit? No Note additional concerns and questions from Patient.  Are there any health concerns that you feel we can do a better job addressing? No Note Patient's response.  Are you having any problems with any of the following since the last visit: (select all that apply)  None  Details:   12. Any falls since last visit? No  Details:  13. Any increased or uncontrolled pain since last visit? No  Details:  14. Next visit Type: telephone       Visit with:clinical pharmacist        Date:12/24/22        Time:2 pm  15. Additional Details? No    Care Gaps: Colonoscopy-08/21/13 Diabetic Foot Exam-10/04/21 Ophthalmology-02/15/22 Dexa Scan - NA Annual Well Visit - 04/18/22 Micro albumin-09/27/21 Hemoglobin A1c- 07/05/22 (7.6), 04/04/22 (7.1)   Star Rating Drugs: Benazepril-hctz 20-12.5 mg-last fill 10/08/20 90 ds Metformin 500 mg-last fill 06/20/22 45 ds, 08/04/21 30 ds Pravastatin 40 mg-last fill 10/08/20 90 ds  [place] [phone]

## 2022-08-16 DIAGNOSIS — H524 Presbyopia: Secondary | ICD-10-CM | POA: Diagnosis not present

## 2022-08-16 DIAGNOSIS — H2513 Age-related nuclear cataract, bilateral: Secondary | ICD-10-CM | POA: Diagnosis not present

## 2022-08-16 LAB — HM DIABETES EYE EXAM

## 2022-08-17 ENCOUNTER — Encounter: Payer: Self-pay | Admitting: Nurse Practitioner

## 2022-09-20 ENCOUNTER — Ambulatory Visit
Admission: RE | Admit: 2022-09-20 | Discharge: 2022-09-20 | Disposition: A | Discharge status: Home / Self Care | Payer: Medicare HMO | Source: Home / Self Care | Attending: Nurse Practitioner | Admitting: Nurse Practitioner

## 2022-09-20 ENCOUNTER — Encounter: Payer: Self-pay | Admitting: Nurse Practitioner

## 2022-09-20 ENCOUNTER — Ambulatory Visit
Admission: RE | Admit: 2022-09-20 | Discharge: 2022-09-20 | Disposition: A | Discharge status: Home / Self Care | Payer: Medicare HMO | Source: Ambulatory Visit | Attending: Nurse Practitioner | Admitting: Nurse Practitioner

## 2022-09-20 ENCOUNTER — Ambulatory Visit (INDEPENDENT_AMBULATORY_CARE_PROVIDER_SITE_OTHER): Payer: Medicare HMO | Admitting: Nurse Practitioner

## 2022-09-20 VITALS — BP 157/100 | HR 109 | Temp 98.5°F | Wt 179.0 lb

## 2022-09-20 DIAGNOSIS — Z79899 Other long term (current) drug therapy: Secondary | ICD-10-CM | POA: Diagnosis not present

## 2022-09-20 DIAGNOSIS — I130 Hypertensive heart and chronic kidney disease with heart failure and stage 1 through stage 4 chronic kidney disease, or unspecified chronic kidney disease: Secondary | ICD-10-CM | POA: Diagnosis not present

## 2022-09-20 DIAGNOSIS — I160 Hypertensive urgency: Secondary | ICD-10-CM | POA: Diagnosis not present

## 2022-09-20 DIAGNOSIS — M25552 Pain in left hip: Secondary | ICD-10-CM | POA: Insufficient documentation

## 2022-09-20 DIAGNOSIS — J984 Other disorders of lung: Secondary | ICD-10-CM | POA: Diagnosis not present

## 2022-09-20 DIAGNOSIS — Z951 Presence of aortocoronary bypass graft: Secondary | ICD-10-CM | POA: Diagnosis not present

## 2022-09-20 DIAGNOSIS — R5383 Other fatigue: Secondary | ICD-10-CM | POA: Diagnosis not present

## 2022-09-20 DIAGNOSIS — I5032 Chronic diastolic (congestive) heart failure: Secondary | ICD-10-CM | POA: Diagnosis not present

## 2022-09-20 DIAGNOSIS — E1122 Type 2 diabetes mellitus with diabetic chronic kidney disease: Secondary | ICD-10-CM | POA: Diagnosis present

## 2022-09-20 DIAGNOSIS — R7989 Other specified abnormal findings of blood chemistry: Secondary | ICD-10-CM | POA: Diagnosis not present

## 2022-09-20 DIAGNOSIS — R55 Syncope and collapse: Secondary | ICD-10-CM | POA: Diagnosis not present

## 2022-09-20 DIAGNOSIS — I2510 Atherosclerotic heart disease of native coronary artery without angina pectoris: Secondary | ICD-10-CM | POA: Diagnosis not present

## 2022-09-20 DIAGNOSIS — M4312 Spondylolisthesis, cervical region: Secondary | ICD-10-CM | POA: Diagnosis not present

## 2022-09-20 DIAGNOSIS — R531 Weakness: Secondary | ICD-10-CM | POA: Diagnosis not present

## 2022-09-20 DIAGNOSIS — R4182 Altered mental status, unspecified: Secondary | ICD-10-CM | POA: Diagnosis not present

## 2022-09-20 DIAGNOSIS — G319 Degenerative disease of nervous system, unspecified: Secondary | ICD-10-CM | POA: Diagnosis not present

## 2022-09-20 DIAGNOSIS — R739 Hyperglycemia, unspecified: Secondary | ICD-10-CM | POA: Diagnosis not present

## 2022-09-20 DIAGNOSIS — R059 Cough, unspecified: Secondary | ICD-10-CM | POA: Diagnosis not present

## 2022-09-20 DIAGNOSIS — E785 Hyperlipidemia, unspecified: Secondary | ICD-10-CM | POA: Diagnosis present

## 2022-09-20 DIAGNOSIS — R000 Tachycardia, unspecified: Secondary | ICD-10-CM | POA: Diagnosis not present

## 2022-09-20 DIAGNOSIS — J9811 Atelectasis: Secondary | ICD-10-CM | POA: Diagnosis present

## 2022-09-20 DIAGNOSIS — R0902 Hypoxemia: Secondary | ICD-10-CM | POA: Diagnosis not present

## 2022-09-20 DIAGNOSIS — E876 Hypokalemia: Secondary | ICD-10-CM | POA: Diagnosis not present

## 2022-09-20 DIAGNOSIS — R001 Bradycardia, unspecified: Secondary | ICD-10-CM | POA: Diagnosis not present

## 2022-09-20 DIAGNOSIS — G9341 Metabolic encephalopathy: Secondary | ICD-10-CM | POA: Diagnosis not present

## 2022-09-20 DIAGNOSIS — E872 Acidosis, unspecified: Secondary | ICD-10-CM | POA: Diagnosis not present

## 2022-09-20 DIAGNOSIS — I6782 Cerebral ischemia: Secondary | ICD-10-CM | POA: Diagnosis not present

## 2022-09-20 DIAGNOSIS — Z66 Do not resuscitate: Secondary | ICD-10-CM | POA: Diagnosis not present

## 2022-09-20 DIAGNOSIS — N1831 Chronic kidney disease, stage 3a: Secondary | ICD-10-CM | POA: Diagnosis present

## 2022-09-20 DIAGNOSIS — K219 Gastro-esophageal reflux disease without esophagitis: Secondary | ICD-10-CM | POA: Diagnosis not present

## 2022-09-20 DIAGNOSIS — T502X5A Adverse effect of carbonic-anhydrase inhibitors, benzothiadiazides and other diuretics, initial encounter: Secondary | ICD-10-CM | POA: Diagnosis present

## 2022-09-20 DIAGNOSIS — Z1152 Encounter for screening for COVID-19: Secondary | ICD-10-CM | POA: Diagnosis not present

## 2022-09-20 DIAGNOSIS — I2489 Other forms of acute ischemic heart disease: Secondary | ICD-10-CM | POA: Diagnosis not present

## 2022-09-20 DIAGNOSIS — N179 Acute kidney failure, unspecified: Secondary | ICD-10-CM | POA: Diagnosis not present

## 2022-09-20 DIAGNOSIS — E860 Dehydration: Secondary | ICD-10-CM | POA: Diagnosis not present

## 2022-09-20 DIAGNOSIS — Z8249 Family history of ischemic heart disease and other diseases of the circulatory system: Secondary | ICD-10-CM | POA: Diagnosis not present

## 2022-09-20 DIAGNOSIS — E1165 Type 2 diabetes mellitus with hyperglycemia: Secondary | ICD-10-CM | POA: Diagnosis not present

## 2022-09-20 NOTE — Progress Notes (Signed)
   BP (!) 157/100 (BP Location: Left Arm, Cuff Size: Normal)   Pulse (!) 109   Temp 98.5 F (36.9 C) (Oral)   Wt 179 lb (81.2 kg)   SpO2 97%   BMI 25.76 kg/m    Subjective:    Patient ID: Ryan Kirby, male    DOB: Jun 02, 1941, 82 y.o.   MRN: [id number]  HPI: Ryan Kirby is a 82 y.o. male  Chief Complaint  Patient presents with   Fall    Pt states he had a fall a couple of weeks ago. States his L hip has been hurting since the fall.    Patient states he fell about a week or so ago.  States his left hip has ben really bothering him.  He landed on his hip.  Patient rates the pain at 6-7/10.  Patient isn't sure if the tylenol he is taking is helping.  He is having to rely on his walking stick a lot.  He feels like he needs the sore.  States it doesn't feel like its going to give out on him but it is sore.     Relevant past medical, surgical, family and social history reviewed and updated as indicated. Interim medical history since our last visit reviewed. Allergies and medications reviewed and updated.  Review of Systems  Musculoskeletal:        Left hip pain    Per HPI unless specifically indicated above     Objective:    BP (!) 157/100 (BP Location: Left Arm, Cuff Size: Normal)   Pulse (!) 109   Temp 98.5 F (36.9 C) (Oral)   Wt 179 lb (81.2 kg)   SpO2 97%   BMI 25.76 kg/m   Wt Readings from Last 3 Encounters:  09/20/22 179 lb (81.2 kg)  07/05/22 186 lb 1.6 oz (84.4 kg)  04/04/22 182 lb 1.6 oz (82.6 kg)    Physical Exam Musculoskeletal:     Left hip: No tenderness or bony tenderness. Normal range of motion. Decreased strength.     Results for orders placed or performed in visit on 08/17/22  HM DIABETES EYE EXAM  Result Value Ref Range   HM Diabetic Eye Exam No Retinopathy No Retinopathy      Assessment & Plan:   Problem List Items Addressed This Visit   None Visit Diagnoses     Left hip pain    -  Primary   Secondary to fall a couple of weeks ago.   Will obtain xray of hip.  Continue with Tylenol.  Will make recommendations based on imaging results.   Relevant Orders   DG Hip Unilat W OR W/O Pelvis 2-3 Views Left        Follow up plan: Return if symptoms worsen or fail to improve.

## 2022-09-23 ENCOUNTER — Emergency Department: Payer: Medicare HMO

## 2022-09-23 ENCOUNTER — Other Ambulatory Visit: Payer: Self-pay

## 2022-09-23 ENCOUNTER — Inpatient Hospital Stay
Admission: EM | Admit: 2022-09-23 | Discharge: 2022-09-28 | DRG: 640 | Disposition: A | Discharge status: Home Health | Payer: Medicare HMO | Attending: Internal Medicine | Admitting: Internal Medicine

## 2022-09-23 ENCOUNTER — Inpatient Hospital Stay: Payer: Medicare HMO

## 2022-09-23 DIAGNOSIS — R0902 Hypoxemia: Secondary | ICD-10-CM | POA: Diagnosis not present

## 2022-09-23 DIAGNOSIS — Z8249 Family history of ischemic heart disease and other diseases of the circulatory system: Secondary | ICD-10-CM | POA: Diagnosis not present

## 2022-09-23 DIAGNOSIS — G319 Degenerative disease of nervous system, unspecified: Secondary | ICD-10-CM | POA: Diagnosis not present

## 2022-09-23 DIAGNOSIS — E860 Dehydration: Secondary | ICD-10-CM | POA: Diagnosis present

## 2022-09-23 DIAGNOSIS — R5383 Other fatigue: Secondary | ICD-10-CM | POA: Diagnosis not present

## 2022-09-23 DIAGNOSIS — R531 Weakness: Principal | ICD-10-CM

## 2022-09-23 DIAGNOSIS — J9811 Atelectasis: Secondary | ICD-10-CM | POA: Diagnosis present

## 2022-09-23 DIAGNOSIS — I2510 Atherosclerotic heart disease of native coronary artery without angina pectoris: Secondary | ICD-10-CM | POA: Diagnosis present

## 2022-09-23 DIAGNOSIS — N1831 Chronic kidney disease, stage 3a: Secondary | ICD-10-CM | POA: Diagnosis present

## 2022-09-23 DIAGNOSIS — M4312 Spondylolisthesis, cervical region: Secondary | ICD-10-CM | POA: Diagnosis not present

## 2022-09-23 DIAGNOSIS — I10 Essential (primary) hypertension: Secondary | ICD-10-CM

## 2022-09-23 DIAGNOSIS — R739 Hyperglycemia, unspecified: Secondary | ICD-10-CM | POA: Diagnosis not present

## 2022-09-23 DIAGNOSIS — E1122 Type 2 diabetes mellitus with diabetic chronic kidney disease: Secondary | ICD-10-CM | POA: Diagnosis present

## 2022-09-23 DIAGNOSIS — I2489 Other forms of acute ischemic heart disease: Secondary | ICD-10-CM | POA: Diagnosis not present

## 2022-09-23 DIAGNOSIS — R7989 Other specified abnormal findings of blood chemistry: Secondary | ICD-10-CM | POA: Diagnosis not present

## 2022-09-23 DIAGNOSIS — E119 Type 2 diabetes mellitus without complications: Secondary | ICD-10-CM | POA: Diagnosis present

## 2022-09-23 DIAGNOSIS — E872 Acidosis, unspecified: Secondary | ICD-10-CM | POA: Diagnosis present

## 2022-09-23 DIAGNOSIS — Z951 Presence of aortocoronary bypass graft: Secondary | ICD-10-CM | POA: Diagnosis not present

## 2022-09-23 DIAGNOSIS — R001 Bradycardia, unspecified: Secondary | ICD-10-CM | POA: Diagnosis not present

## 2022-09-23 DIAGNOSIS — Z66 Do not resuscitate: Secondary | ICD-10-CM | POA: Diagnosis present

## 2022-09-23 DIAGNOSIS — I5032 Chronic diastolic (congestive) heart failure: Secondary | ICD-10-CM | POA: Diagnosis present

## 2022-09-23 DIAGNOSIS — R000 Tachycardia, unspecified: Secondary | ICD-10-CM | POA: Diagnosis not present

## 2022-09-23 DIAGNOSIS — T502X5A Adverse effect of carbonic-anhydrase inhibitors, benzothiadiazides and other diuretics, initial encounter: Secondary | ICD-10-CM | POA: Diagnosis present

## 2022-09-23 DIAGNOSIS — R4182 Altered mental status, unspecified: Secondary | ICD-10-CM | POA: Diagnosis not present

## 2022-09-23 DIAGNOSIS — R55 Syncope and collapse: Secondary | ICD-10-CM | POA: Diagnosis not present

## 2022-09-23 DIAGNOSIS — Z89022 Acquired absence of left finger(s): Secondary | ICD-10-CM

## 2022-09-23 DIAGNOSIS — R350 Frequency of micturition: Secondary | ICD-10-CM | POA: Diagnosis present

## 2022-09-23 DIAGNOSIS — E7800 Pure hypercholesterolemia, unspecified: Secondary | ICD-10-CM

## 2022-09-23 DIAGNOSIS — E1165 Type 2 diabetes mellitus with hyperglycemia: Secondary | ICD-10-CM | POA: Diagnosis present

## 2022-09-23 DIAGNOSIS — I130 Hypertensive heart and chronic kidney disease with heart failure and stage 1 through stage 4 chronic kidney disease, or unspecified chronic kidney disease: Secondary | ICD-10-CM | POA: Diagnosis present

## 2022-09-23 DIAGNOSIS — E876 Hypokalemia: Secondary | ICD-10-CM | POA: Diagnosis present

## 2022-09-23 DIAGNOSIS — G9341 Metabolic encephalopathy: Secondary | ICD-10-CM | POA: Diagnosis present

## 2022-09-23 DIAGNOSIS — N179 Acute kidney failure, unspecified: Secondary | ICD-10-CM | POA: Diagnosis present

## 2022-09-23 DIAGNOSIS — M1612 Unilateral primary osteoarthritis, left hip: Secondary | ICD-10-CM | POA: Diagnosis present

## 2022-09-23 DIAGNOSIS — E785 Hyperlipidemia, unspecified: Secondary | ICD-10-CM | POA: Diagnosis present

## 2022-09-23 DIAGNOSIS — Z1152 Encounter for screening for COVID-19: Secondary | ICD-10-CM

## 2022-09-23 DIAGNOSIS — Z7984 Long term (current) use of oral hypoglycemic drugs: Secondary | ICD-10-CM | POA: Diagnosis present

## 2022-09-23 DIAGNOSIS — K219 Gastro-esophageal reflux disease without esophagitis: Secondary | ICD-10-CM | POA: Diagnosis present

## 2022-09-23 DIAGNOSIS — I6782 Cerebral ischemia: Secondary | ICD-10-CM | POA: Diagnosis not present

## 2022-09-23 DIAGNOSIS — Z79899 Other long term (current) drug therapy: Secondary | ICD-10-CM

## 2022-09-23 DIAGNOSIS — I160 Hypertensive urgency: Secondary | ICD-10-CM | POA: Diagnosis present

## 2022-09-23 DIAGNOSIS — J984 Other disorders of lung: Secondary | ICD-10-CM | POA: Diagnosis not present

## 2022-09-23 HISTORY — DX: Acidosis, unspecified: E87.20

## 2022-09-23 HISTORY — DX: Acute kidney failure, unspecified: N17.9

## 2022-09-23 HISTORY — DX: Hypertensive urgency: I16.0

## 2022-09-23 HISTORY — DX: Hypercalcemia: E83.52

## 2022-09-23 HISTORY — DX: Metabolic encephalopathy: G93.41

## 2022-09-23 LAB — CBC WITH DIFFERENTIAL/PLATELET
Abs Immature Granulocytes: 0.07 10*3/uL (ref 0.00–0.07)
Basophils Absolute: 0 10*3/uL (ref 0.0–0.1)
Basophils Relative: 0 %
Eosinophils Absolute: 0 10*3/uL (ref 0.0–0.5)
Eosinophils Relative: 0 %
HCT: 45.9 % (ref 39.0–52.0)
Hemoglobin: 16.2 g/dL (ref 13.0–17.0)
Immature Granulocytes: 1 %
Lymphocytes Relative: 5 %
Lymphs Abs: 0.7 10*3/uL (ref 0.7–4.0)
MCH: 30.7 pg (ref 26.0–34.0)
MCHC: 35.3 g/dL (ref 30.0–36.0)
MCV: 86.9 fL (ref 80.0–100.0)
Monocytes Absolute: 0.4 10*3/uL (ref 0.1–1.0)
Monocytes Relative: 3 %
Neutro Abs: 12.8 10*3/uL — ABNORMAL HIGH (ref 1.7–7.7)
Neutrophils Relative %: 91 %
Platelets: 357 10*3/uL (ref 150–400)
RBC: 5.28 MIL/uL (ref 4.22–5.81)
RDW: 13.1 % (ref 11.5–15.5)
WBC: 14 10*3/uL — ABNORMAL HIGH (ref 4.0–10.5)
nRBC: 0 % (ref 0.0–0.2)

## 2022-09-23 LAB — CBG MONITORING, ED
Glucose-Capillary: 390 mg/dL — ABNORMAL HIGH (ref 70–99)
Glucose-Capillary: 132 mg/dL — ABNORMAL HIGH (ref 70–99)

## 2022-09-23 LAB — URINALYSIS, ROUTINE W REFLEX MICROSCOPIC
Bacteria, UA: NONE SEEN
Bilirubin Urine: NEGATIVE
Glucose, UA: >=500 mg/dL — AB
Ketones, ur: 20 mg/dL — AB
Leukocytes,Ua: NEGATIVE
Nitrite: NEGATIVE
Protein, ur: 30 mg/dL — AB
Specific Gravity, Urine: 1.022 (ref 1.005–1.030)
Squamous Epithelial / HPF: NONE SEEN /HPF (ref 0–5)
pH: 5 (ref 5.0–8.0)

## 2022-09-23 LAB — COMPREHENSIVE METABOLIC PANEL
ALT: 14 U/L (ref 0–44)
AST: 36 U/L (ref 15–41)
Albumin: 3.7 g/dL (ref 3.5–5.0)
Alkaline Phosphatase: 74 U/L (ref 38–126)
Anion gap: 17 — ABNORMAL HIGH (ref 5–15)
BUN: 41 mg/dL — ABNORMAL HIGH (ref 8–23)
CO2: 26 mmol/L (ref 22–32)
Calcium: 12.4 mg/dL — ABNORMAL HIGH (ref 8.9–10.3)
Chloride: 96 mmol/L — ABNORMAL LOW (ref 98–111)
Creatinine, Ser: 1.68 mg/dL — ABNORMAL HIGH (ref 0.61–1.24)
GFR, Estimated: 41 mL/min — ABNORMAL LOW (ref >60)
Glucose, Bld: 403 mg/dL — ABNORMAL HIGH (ref 70–99)
Potassium: 2.6 mmol/L — CL (ref 3.5–5.1)
Sodium: 139 mmol/L (ref 135–145)
Total Bilirubin: 1.4 mg/dL — ABNORMAL HIGH (ref 0.3–1.2)
Total Protein: 6.9 g/dL (ref 6.5–8.1)

## 2022-09-23 LAB — RESP PANEL BY RT-PCR (RSV, FLU A&B, COVID)  RVPGX2
Influenza A by PCR: NEGATIVE
Influenza B by PCR: NEGATIVE
Resp Syncytial Virus by PCR: NEGATIVE
SARS Coronavirus 2 by RT PCR: NEGATIVE

## 2022-09-23 LAB — TROPONIN I (HIGH SENSITIVITY)
Troponin I (High Sensitivity): 80 ng/L — ABNORMAL HIGH (ref <18)
Troponin I (High Sensitivity): 75 ng/L — ABNORMAL HIGH (ref <18)
Troponin I (High Sensitivity): 79 ng/L — ABNORMAL HIGH (ref <18)
Troponin I (High Sensitivity): 85 ng/L — ABNORMAL HIGH (ref <18)

## 2022-09-23 LAB — CK: Total CK: 65 U/L (ref 49–397)

## 2022-09-23 LAB — MAGNESIUM: Magnesium: 1.9 mg/dL (ref 1.7–2.4)

## 2022-09-23 LAB — LACTIC ACID, PLASMA
Lactic Acid, Venous: 3 mmol/L (ref 0.5–1.9)
Lactic Acid, Venous: 2.9 mmol/L (ref 0.5–1.9)

## 2022-09-23 MED ORDER — POTASSIUM CHLORIDE 10 MEQ/100ML IV SOLN
10.0000 meq | INTRAVENOUS | Status: AC
  Administered 2022-09-23 (×4): 10 meq via INTRAVENOUS
  Filled 2022-09-23 (×2): qty 100

## 2022-09-23 MED ORDER — CARVEDILOL 6.25 MG PO TABS
25.0000 mg | ORAL_TABLET | Freq: Two times a day (BID) | ORAL | Status: DC
  Administered 2022-09-23 – 2022-09-24 (×2): 25 mg via ORAL
  Filled 2022-09-23: qty 1
  Filled 2022-09-23: qty 4

## 2022-09-23 MED ORDER — SODIUM CHLORIDE 0.9 % IV SOLN
2.0000 g | Freq: Once | INTRAVENOUS | Status: AC
  Administered 2022-09-23: 2 g via INTRAVENOUS
  Filled 2022-09-23: qty 12.5

## 2022-09-23 MED ORDER — PANTOPRAZOLE SODIUM 20 MG PO TBEC: 20.0000 mg | DELAYED_RELEASE_TABLET | Freq: Every day | ORAL | Status: DC | PRN

## 2022-09-23 MED ORDER — CARVEDILOL 25 MG PO TABS: 50.0000 mg | ORAL_TABLET | Freq: Two times a day (BID) | ORAL | Status: DC

## 2022-09-23 MED ORDER — METRONIDAZOLE 500 MG/100ML IV SOLN
500.0000 mg | Freq: Once | INTRAVENOUS | Status: AC
  Administered 2022-09-23: 500 mg via INTRAVENOUS
  Filled 2022-09-23: qty 100

## 2022-09-23 MED ORDER — DOXAZOSIN MESYLATE 2 MG PO TABS
2.0000 mg | ORAL_TABLET | Freq: Every morning | ORAL | Status: DC
  Administered 2022-09-24 – 2022-09-28 (×5): 2 mg via ORAL
  Filled 2022-09-23 (×5): qty 1

## 2022-09-23 MED ORDER — VANCOMYCIN HCL IN DEXTROSE 1-5 GM/200ML-% IV SOLN
1000.0000 mg | Freq: Once | INTRAVENOUS | Status: AC
  Administered 2022-09-23: 1000 mg via INTRAVENOUS
  Filled 2022-09-23: qty 200

## 2022-09-23 MED ORDER — ASPIRIN 81 MG PO TBEC
81.0000 mg | DELAYED_RELEASE_TABLET | Freq: Every day | ORAL | Status: DC
  Administered 2022-09-23 – 2022-09-28 (×6): 81 mg via ORAL
  Filled 2022-09-23 (×6): qty 1

## 2022-09-23 MED ORDER — ACETAMINOPHEN 500 MG PO TABS
500.0000 mg | ORAL_TABLET | Freq: Four times a day (QID) | ORAL | Status: DC | PRN
  Administered 2022-09-26: 500 mg via ORAL
  Filled 2022-09-23: qty 1

## 2022-09-23 MED ORDER — PRAVASTATIN SODIUM 40 MG PO TABS
40.0000 mg | ORAL_TABLET | Freq: Every day | ORAL | Status: DC
  Administered 2022-09-23 – 2022-09-27 (×5): 40 mg via ORAL
  Filled 2022-09-23: qty 1
  Filled 2022-09-23 (×2): qty 2
  Filled 2022-09-23: qty 1
  Filled 2022-09-23: qty 2

## 2022-09-23 MED ORDER — DOCUSATE SODIUM 100 MG PO CAPS: 100.0000 mg | ORAL_CAPSULE | Freq: Every day | ORAL | Status: DC | PRN

## 2022-09-23 MED ORDER — INSULIN ASPART 100 UNIT/ML IJ SOLN: 0.0000 [IU] | Freq: Every day | INTRAMUSCULAR | Status: DC

## 2022-09-23 MED ORDER — MAGNESIUM SULFATE 2 GM/50ML IV SOLN
2.0000 g | Freq: Once | INTRAVENOUS | Status: AC
  Administered 2022-09-23: 2 g via INTRAVENOUS
  Filled 2022-09-23: qty 50

## 2022-09-23 MED ORDER — SODIUM CHLORIDE 0.9 % IV SOLN: INTRAVENOUS | Status: DC

## 2022-09-23 MED ORDER — ONDANSETRON HCL 4 MG/2ML IJ SOLN: 4.0000 mg | Freq: Four times a day (QID) | INTRAMUSCULAR | Status: DC | PRN

## 2022-09-23 MED ORDER — LACTATED RINGERS IV BOLUS
1000.0000 mL | Freq: Once | INTRAVENOUS | Status: AC
  Administered 2022-09-23: 1000 mL via INTRAVENOUS

## 2022-09-23 MED ORDER — ENOXAPARIN SODIUM 40 MG/0.4ML IJ SOSY
40.0000 mg | PREFILLED_SYRINGE | INTRAMUSCULAR | Status: DC
  Administered 2022-09-23 – 2022-09-28 (×6): 40 mg via SUBCUTANEOUS
  Filled 2022-09-23 (×6): qty 0.4

## 2022-09-23 MED ORDER — POTASSIUM CHLORIDE CRYS ER 20 MEQ PO TBCR
40.0000 meq | EXTENDED_RELEASE_TABLET | Freq: Once | ORAL | Status: AC
  Administered 2022-09-23: 40 meq via ORAL
  Filled 2022-09-23: qty 2

## 2022-09-23 MED ORDER — CLONIDINE HCL 0.1 MG PO TABS
0.3000 mg | ORAL_TABLET | Freq: Two times a day (BID) | ORAL | Status: DC
  Administered 2022-09-23 – 2022-09-24 (×2): 0.3 mg via ORAL
  Filled 2022-09-23 (×3): qty 3

## 2022-09-23 MED ORDER — AMLODIPINE BESYLATE 5 MG PO TABS
10.0000 mg | ORAL_TABLET | Freq: Every day | ORAL | Status: DC
  Administered 2022-09-23 – 2022-09-24 (×2): 10 mg via ORAL
  Filled 2022-09-23 (×2): qty 2

## 2022-09-23 MED ORDER — INSULIN ASPART 100 UNIT/ML IJ SOLN
0.0000 [IU] | Freq: Three times a day (TID) | INTRAMUSCULAR | Status: DC
  Administered 2022-09-23: 15 [IU] via SUBCUTANEOUS
  Administered 2022-09-24: 5 [IU] via SUBCUTANEOUS
  Administered 2022-09-24 – 2022-09-25 (×3): 3 [IU] via SUBCUTANEOUS
  Administered 2022-09-25: 5 [IU] via SUBCUTANEOUS
  Administered 2022-09-26: 3 [IU] via SUBCUTANEOUS
  Administered 2022-09-26 – 2022-09-27 (×3): 8 [IU] via SUBCUTANEOUS
  Administered 2022-09-28: 11 [IU] via SUBCUTANEOUS
  Administered 2022-09-28: 2 [IU] via SUBCUTANEOUS
  Administered 2022-09-28: 8 [IU] via SUBCUTANEOUS
  Filled 2022-09-23 (×12): qty 1

## 2022-09-23 MED ORDER — ONDANSETRON HCL 4 MG PO TABS: 4.0000 mg | ORAL_TABLET | Freq: Four times a day (QID) | ORAL | Status: DC | PRN

## 2022-09-23 NOTE — H&P (Signed)
History and Physical    Patient: Ryan Kirby FVC:[id number] DOB: [birth year] DOA: 09/23/2022 DOS: the patient was seen and examined on 09/23/2022 PCP: Jon Billings, NP  Patient coming from: Home  Chief Complaint:  Chief Complaint  Patient presents with   Altered Mental Status   History was obtained from patient's daughter [name] over the phone HPI: Ryan Kirby is a 82 y.o. male with medical history significant for diabetes mellitus with complications of stage III chronic kidney disease, coronary artery disease, hypertension, GERD, osteoarthritis who was sent to the ER by his children for evaluation of mental status changes. Per patient's daughter he had complained about increased frequency of urination 1 day prior to his admission and on the day of his admission his son had gone to check on him since he lives alone and was unable to wake him up and so he called EMS.  Per EMS he required sternal rub to arouse and had a blood pressure of 145/106, blood sugar 484 and heart rate 110. Daughter states that he has had difficulty getting around due to left hip pain and usually ambulates with a cane.  He was seen by his primary care provider and had an x-ray done as an outpatient which showed severe osteoarthritis. Patient states that he does not feel right but has no constitutional symptoms besides constipation.  He denies having any shortness of breath, no chest pain, no headache, no fever, no chills, no cough, no abdominal pain, no leg swelling, no focal deficits or blurred vision. Abnormal labs include white count of 14, lactic acid of 3.0, potassium 2.6, serum creatinine 1.68 compared to baseline of 1.2, calcium level of 12.4, glucose of 403 with no evidence of metabolic acidosis Chest x-ray reviewed by me shows minimal linear density left base likely atelectasis.  CT scan of the brain and cervical spine showed no acute intracranial process. Atrophy and chronic microvascular ischemic  changes. No acute cervical spine fracture. Multilevel degenerative changes. Patient received 1 L IV fluid bolus, IV magnesium and potassium supplements as well as a dose of cefepime, vancomycin and metronidazole   Review of Systems: As mentioned in the history of present illness. All other systems reviewed and are negative. Past Medical History:  Diagnosis Date   Allergy    Arthritis    CAD (coronary artery disease)    Chronic kidney disease    pt is not on dialysis   Diabetes mellitus without complication ([place])    GERD (gastroesophageal reflux disease)    Hyperlipidemia    Hypertension    Past Surgical History:  Procedure Laterality Date   BOWEL RESECTION  2004   CORONARY ARTERY BYPASS GRAFT     FINGER AMPUTATION Left 2011   5th   INGUINAL HERNIA REPAIR Right 06/01/2020   Procedure: HERNIA REPAIR INGUINAL ADULT, open;  Surgeon: Fredirick Maudlin, MD;  Location: [facility] ORS;  Service: General;  Laterality: Right;   KNEE ARTHROSCOPY Right 09/12/2015   Procedure: ARTHROSCOPY KNEE, LATERAL MENISECTOMY, CHONDROPLASTY;  Surgeon: Dereck Leep, MD;  Location: [facility] ORS;  Service: Orthopedics;  Laterality: Right;   WISDOM TOOTH EXTRACTION     Social History:  reports that he has never smoked. He has never used smokeless tobacco. He reports that he does not drink alcohol and does not use drugs.  No Known Allergies  Family History  Problem Relation Age of Onset   Arthritis Mother    Hypertension Mother    Breast cancer Mother    Arthritis  Father    Hypertension Father    Colon cancer Father     Prior to Admission medications   Medication Sig Start Date End Date Taking? Authorizing Provider  acetaminophen (TYLENOL) 500 MG tablet Take 500 mg by mouth every 6 (six) hours as needed.    [provider]  amLODipine (NORVASC) 10 MG tablet TAKE 1 TABLET EVERY DAY 10/04/20   Cannady, Henrine Screws T, NP  benazepril-hydrochlorthiazide (LOTENSIN HCT) 20-12.5 MG tablet TAKE 1 TABLET EVERY DAY  10/04/20   Cannady, Henrine Screws T, NP  Blood Glucose Monitoring Suppl (ACCU-CHEK GUIDE ME) w/Device KIT  01/16/21   [provider]  carvedilol (COREG) 25 MG tablet TAKE 2 TABLETS TWICE DAILY 10/04/20   Cannady, Jolene T, NP  cloNIDine (CATAPRES) 0.3 MG tablet TAKE 1 TABLET TWICE DAILY 10/04/20   Cannady, Jolene T, NP  docusate sodium (COLACE) 100 MG capsule Take 100 mg by mouth every other day.  02/10/20   [provider]  doxazosin (CARDURA) 2 MG tablet Take 1 tablet (2 mg total) by mouth every morning. 04/23/22   Jon Billings, NP  fluticasone [facility]) 50 MCG/ACT nasal spray Place 2 sprays into both nostrils daily. 07/17/21   McElwee, Lauren A, NP  furosemide (LASIX) 40 MG tablet Take 1 tablet (40 mg total) by mouth daily. 08/01/22   Jon Billings, NP  metFORMIN (GLUCOPHAGE) 500 MG tablet Take 2 tablets (1,000 mg total) by mouth 2 (two) times daily. 06/20/22   Jon Billings, NP  omeprazole (PRILOSEC OTC) 20 MG tablet Take 20 mg by mouth as needed.    [provider]  potassium chloride SA (KLOR-CON M) 20 MEQ tablet Take 1 tablet (20 mEq total) by mouth daily. 05/23/22   Jon Billings, NP  pravastatin (PRAVACHOL) 40 MG tablet TAKE 1 TABLET AT BEDTIME 10/04/20   Marnee Guarneri T, NP    Physical Exam: Vitals:   09/23/22 1249 09/23/22 1300 09/23/22 1500 09/23/22 1530  BP:  (!) 173/83 (!) 185/105 (!) 207/99  Pulse:  (!) 107 (!) 105 (!) 107  Resp:  14 18 (!) 21  Temp: (!) 97.5 F (36.4 C)     TempSrc: Oral     SpO2:  98% 96% 97%  Weight:      Height:       Physical Exam Vitals and nursing note reviewed.  Constitutional:      Comments: Pleasant elderly male lying in bed in no distress.  Slow to respond but appropriate.  Oriented to person, place and time  HENT:     Head: Normocephalic.     Nose: Nose normal.     Mouth/Throat:     Mouth: Mucous membranes are moist.  Eyes:     Conjunctiva/sclera: Conjunctivae normal.  Cardiovascular:     Rate and Rhythm:  Tachycardia present.  Pulmonary:     Effort: Pulmonary effort is normal.     Breath sounds: Normal breath sounds.  Abdominal:     General: Abdomen is flat. Bowel sounds are normal.     Palpations: Abdomen is soft.  Musculoskeletal:        General: Normal range of motion.     Cervical back: Normal range of motion and neck supple.  Skin:    General: Skin is warm and dry.  Neurological:     Mental Status: He is alert.     Motor: Weakness present.  Psychiatric:        Mood and Affect: Mood normal.  Behavior: Behavior normal.     Data Reviewed: Relevant notes from primary care and specialist visits, past discharge summaries as available in EHR, including Care Everywhere. Prior diagnostic testing as pertinent to current admission diagnoses Updated medications and problem lists for reconciliation ED course, including vitals, labs, imaging, treatment and response to treatment Triage notes, nursing and pharmacy notes and ED provider's notes Notable results as noted in HPI Labs reviewed.  Sodium 139, potassium 2.6, chloride 96, bicarb 26, glucose 413, BUN 41, creatinine 1.68 compared to baseline of 1.2, calcium 12.4, total protein 6.9, albumin 3.7, AST 36, ALT 14, alkaline phosphatase 74, total bilirubin 1.4, white count 14.0, hemoglobin 16.2, hematocrit 45.9, platelet count 357, lactic acid 3.0 Respiratory viral panel is negative Twelve-lead EKG reviewed by me shows sinus tachycardia with PVCs There are no new results to review at this time.  Assessment and Plan: * Acute metabolic encephalopathy Patient presents for evaluation of mental status changes which family describes as increased lethargy and difficulty to arouse patient. Patient noted to have significant electrolyte abnormalities which include hyperglycemia, hypokalemia and hypercalcemia Will obtain MRI of the brain to rule out an acute stroke as etiology for patient's mental status changes Expect improvement in patient's  mental status following improvement in electrolyte abnormalities  AKI (acute kidney injury) ([place]) Most likely secondary to osmotic diuresis from hyperglycemia as well as concomitant diuretic use Hold HCTZ and furosemide as well as benazepril Hydrate patient and repeat renal parameters in a.m.  Diabetes mellitus with hyperglycemia ([place]) Patient with a history of diabetes mellitus with significant hyperglycemia Hold metformin Check serial cardiac enzymes to rule out an acute coronary syndrome Hydrate patient Sliding scale insulin for glycemic control Maintain consistent carbohydrate diet  Hypokalemia Most likely secondary to diuretic use Supplement potassium Check magnesium levels  Lactic acidosis Patient with lactic acidosis, lactate of 3.0 on admission No evidence of an infectious process at this time Most likely related to metformin use Patient received empiric antibiotics with vancomycin, cefepime and Flagyl Will hold off on further antibiotic therapy for now unless culture results are positive  Hypercalcemia Patient with significant hypercalcemia Most likely medication induced since he is on HCTZ Hold HCTZ Hydrate patient and repeat calcium level  CAD (coronary artery disease) Denies having any chest pain or shortness of breath but has elevated troponin levels which could be from demand ischemia related to tachycardia and uncontrolled hypertension Continue carvedilol, pravastatin and aspirin Cycle cardiac enzymes Obtain 2D echocardiogram to assess LVEF and rule out regional wall motion abnormality  Hypertensive urgency Patient on multiple antihypertensives which include amlodipine, clonidine, carvedilol and Cardura which will be resumed during this hospitalization  CKD stage 3 secondary to diabetes ([place]) Patient has stage III chronic kidney disease secondary to diabetes and hypertension Monitor renal function closely  GERD (gastroesophageal reflux disease) Continue  omeprazole      Advance Care Planning:   Code Status: DNR   Consults: None  Family Communication: Greater than 50% of time was spent discussing patient's condition and plan of care with his daughter over the phone.  All questions and concerns have been addressed.  She verbalizes understanding and agrees with the plan.  CODE STATUS was discussed and patient is a DNR.  Severity of Illness: The appropriate patient status for this patient is INPATIENT. Inpatient status is judged to be reasonable and necessary in order to provide the required intensity of service to ensure the patient's safety. The patient's presenting symptoms, physical exam findings, and initial radiographic and  laboratory data in the context of their chronic comorbidities is felt to place them at high risk for further clinical deterioration. Furthermore, it is not anticipated that the patient will be medically stable for discharge from the hospital within 2 midnights of admission.   * I certify that at the point of admission it is my clinical judgment that the patient will require inpatient hospital care spanning beyond 2 midnights from the point of admission due to high intensity of service, high risk for further deterioration and high frequency of surveillance required.*  Author: Collier Bullock, MD 09/23/2022 3:46 PM  For on call review [email or web address].

## 2022-09-23 NOTE — Assessment & Plan Note (Addendum)
Likely secondary to dehydration and not eating very well also hydrochlorothiazide could be contributing.  Send off SPEP, PTH RP and vitamin D.  PTH normal range which rules out primary hyperparathyroidism.  TSH 0.388. Ca has been stable.

## 2022-09-23 NOTE — Sepsis Progress Note (Signed)
Elink following code sepsis

## 2022-09-23 NOTE — ED Provider Notes (Signed)
[facility] Provider Note    Event Date/Time   First MD Initiated Contact with Patient 09/23/22 1243     (approximate)   History   Chief Complaint Altered Mental Status   HPI  Ryan Kirby is a 82 y.o. male with past medical history of hypertension, hyperlipidemia, diabetes, CAD, and CKD who presents to the ED for altered mental status.  Per EMS, [place] but has family members that check on him periodically.  Over the past couple of days, family has found him to be lethargic and slower to respond than usual.  He has also been sleeping much more than usual and family reported difficulty waking him up earlier this morning.  Patient reports that he has been feeling unwell and generally weak, but he denies any fevers, cough, chest pain, or shortness of breath.  He states he has been eating and drinking normally with no nausea, vomiting, diarrhea, or dysuria.      Physical Exam   Triage Vital Signs: ED Triage Vitals  Enc Vitals Group     BP 09/23/22 1230 (!) 187/105     Pulse Rate 09/23/22 1230 (!) 115     Resp 09/23/22 1230 (!) 21     Temp --      Temp src --      SpO2 09/23/22 1230 100 %     Weight 09/23/22 1231 180 lb (81.6 kg)     Height 09/23/22 1231 '5\' 11"'$  (1.803 m)     Head Circumference --      Peak Flow --      Pain Score 09/23/22 1230 0     Pain Loc --      Pain Edu? --      Excl. in [place]? --     Most recent vital signs: Vitals:   09/23/22 1249 09/23/22 1300  BP:  (!) 173/83  Pulse:  (!) 107  Resp:  14  Temp: (!) 97.5 F (36.4 C)   SpO2:  98%    Constitutional: Alert and oriented to person and place, but not time or situation. Eyes: Conjunctivae are normal. Head: Atraumatic. Nose: No congestion/rhinnorhea. Mouth/Throat: Mucous membranes are moist.  Neck: No midline cervical spine tenderness to palpation. Cardiovascular: Tachycardic, regular rhythm. Grossly normal heart sounds.  2+ radial pulses  bilaterally. Respiratory: Normal respiratory effort.  No retractions. Lungs CTAB. Gastrointestinal: Soft and nontender. No distention. Musculoskeletal: Ecchymosis over left knee and right foot with no bony tenderness to palpation, range of motion intact without pain.  No upper extremity bony tenderness to palpation. Neurologic:  Normal speech and language. No gross focal neurologic deficits are appreciated.    ED Results / Procedures / Treatments   Labs (all labs ordered are listed, but only abnormal results are displayed) Labs Reviewed  CBC WITH DIFFERENTIAL/PLATELET - Abnormal; Notable for the following components:      Result Value   WBC 14.0 (*)    Neutro Abs 12.8 (*)    All other components within normal limits  URINALYSIS, ROUTINE W REFLEX MICROSCOPIC - Abnormal; Notable for the following components:   Color, Urine YELLOW (*)    APPearance CLEAR (*)    Glucose, UA >=500 (*)    Hgb urine dipstick SMALL (*)    Ketones, ur 20 (*)    Protein, ur 30 (*)    All other components within normal limits  LACTIC ACID, PLASMA - Abnormal; Notable for the following components:  Lactic Acid, Venous 3.0 (*)    All other components within normal limits  LACTIC ACID, PLASMA - Abnormal; Notable for the following components:   Lactic Acid, Venous 2.9 (*)    All other components within normal limits  COMPREHENSIVE METABOLIC PANEL - Abnormal; Notable for the following components:   Potassium 2.6 (*)    Chloride 96 (*)    Glucose, Bld 403 (*)    BUN 41 (*)    Creatinine, Ser 1.68 (*)    Calcium 12.4 (*)    Total Bilirubin 1.4 (*)    GFR, Estimated 41 (*)    Anion gap 17 (*)    All other components within normal limits  TROPONIN I (HIGH SENSITIVITY) - Abnormal; Notable for the following components:   Troponin I (High Sensitivity) 80 (*)    All other components within normal limits  TROPONIN I (HIGH SENSITIVITY) - Abnormal; Notable for the following components:   Troponin I (High  Sensitivity) 75 (*)    All other components within normal limits  RESP PANEL BY RT-PCR (RSV, FLU A&B, COVID)  RVPGX2  CULTURE, BLOOD (ROUTINE X 2)  CULTURE, BLOOD (ROUTINE X 2)  MAGNESIUM  CK  TROPONIN I (HIGH SENSITIVITY)     EKG  ED ECG REPORT I, Blake Divine, the attending physician, personally viewed and interpreted this ECG.   Date: 09/23/2022  EKG Time: 12:36  Rate: 116  Rhythm: sinus tachycardia  Axis: Normal  Intervals:none  ST&T Change: None  RADIOLOGY CT head reviewed and interpreted by me with no hemorrhage or midline shift.  Chest x-ray reviewed and interpreted by me with no infiltrate, edema, or effusion.  PROCEDURES:  Critical Care performed: Yes, see critical care procedure note(s)  .Critical Care  Performed by: Blake Divine, MD Authorized by: Blake Divine, MD   Critical care provider statement:    Critical care time (minutes):  30   Critical care time was exclusive of:  Separately billable procedures and treating other patients and teaching time   Critical care was necessary to treat or prevent imminent or life-threatening deterioration of the following conditions:  Sepsis   Critical care was time spent personally by me on the following activities:  Development of treatment plan with patient or surrogate, discussions with consultants, evaluation of patient's response to treatment, examination of patient, ordering and review of laboratory studies, ordering and review of radiographic studies, ordering and performing treatments and interventions, pulse oximetry, re-evaluation of patient's condition and review of old charts   I assumed direction of critical care for this patient from another provider in my specialty: no     Care discussed with: admitting provider      MEDICATIONS ORDERED IN ED: Medications  potassium chloride 10 mEq in 100 mL IVPB (10 mEq Intravenous New Bag/Given 09/23/22 1454)  acetaminophen (TYLENOL) tablet 500 mg (has no  administration in time range)  amLODipine (NORVASC) tablet 10 mg (has no administration in time range)  doxazosin (CARDURA) tablet 2 mg (has no administration in time range)  cloNIDine (CATAPRES) tablet 0.3 mg (has no administration in time range)  pravastatin (PRAVACHOL) tablet 40 mg (has no administration in time range)  docusate sodium (COLACE) capsule 100 mg (has no administration in time range)  omeprazole (PRILOSEC OTC) EC tablet 20 mg (has no administration in time range)  insulin aspart (novoLOG) injection 0-15 Units (has no administration in time range)  insulin aspart (novoLOG) injection 0-5 Units (has no administration in time range)  enoxaparin (LOVENOX) injection 40  mg (has no administration in time range)  0.9 %  sodium chloride infusion (has no administration in time range)  ondansetron (ZOFRAN) tablet 4 mg (has no administration in time range)    Or  ondansetron (ZOFRAN) injection 4 mg (has no administration in time range)  carvedilol (COREG) tablet 25 mg (has no administration in time range)  aspirin EC tablet 81 mg (has no administration in time range)  lactated ringers bolus 1,000 mL (0 mLs Intravenous Stopped 09/23/22 1437)  ceFEPIme (MAXIPIME) 2 g in sodium chloride 0.9 % 100 mL IVPB (0 g Intravenous Stopped 09/23/22 1455)  metroNIDAZOLE (FLAGYL) IVPB 500 mg (0 mg Intravenous Stopped 09/23/22 1435)  vancomycin (VANCOCIN) IVPB 1000 mg/200 mL premix (0 mg Intravenous Stopped 09/23/22 1436)  magnesium sulfate IVPB 2 g 50 mL (0 g Intravenous Stopped 09/23/22 1455)  potassium chloride SA (KLOR-CON M) CR tablet 40 mEq (40 mEq Oral Given 09/23/22 1442)     IMPRESSION / MDM / ASSESSMENT AND PLAN / ED COURSE  I reviewed the triage vital signs and the nursing notes.                              82 y.o. male with past medical history of hypertension, hyperlipidemia, diabetes, CAD, and CKD who presents to the ED for altered mental status and generalized weakness developing over the  past 2 to 3 days.  Patient's presentation is most consistent with acute presentation with potential threat to life or bodily function.  Differential diagnosis includes, but is not limited to, sepsis, UTI, pneumonia, dehydration, electrolyte abnormality, AKI, stroke, TIA, DKA, hyperglycemia.  Patient nontoxic-appearing and in no acute distress, vital signs remarkable for tachycardia and elevated blood pressure, along with mild tachypnea.  He is not in any respiratory distress and maintaining oxygen saturations at 97% on room air.  He appears globally weak but with no focal neurologic deficits on exam, does have signs of falls with ecchymosis to bilateral lower extremities but no evidence of bony injury on exam.  EKG shows sinus tachycardia with no ischemic changes.  CT head and cervical spine are negative for acute process, chest x-ray unremarkable.  Patient does meet sepsis criteria based off of leukocytosis, tachycardia, and tachypnea, but no obvious source of infection at this time.  Chest x-ray and urinalysis are both unremarkable.  Patient does have AKI with hypokalemia, will replete potassium and magnesium.  He is also hyperglycemic but no evidence of DKA given normal bicarb, mild elevation in anion gap is likely due to elevated lactic acid.  At this point, no obvious source for patient's sepsis however he remains significantly altered compared to reported baseline.  Case discussed with hospitalist for admission for further management of electrolyte abnormalities and altered mental status.      FINAL CLINICAL IMPRESSION(S) / ED DIAGNOSES   Final diagnoses:  Generalized weakness  Altered mental status, unspecified altered mental status type     Rx / DC Orders   ED Discharge Orders     None        Note:  This document was prepared using Dragon voice recognition software and may include unintentional dictation errors.   Blake Divine, MD 09/23/22 [phone]

## 2022-09-23 NOTE — Assessment & Plan Note (Addendum)
Likely secondary to metformin.  Improved with hydration.

## 2022-09-23 NOTE — Assessment & Plan Note (Addendum)
Continue IV fluid hydration continue to monitor.  Creatinine improved from 1.68 down to 1.09

## 2022-09-23 NOTE — ED Notes (Signed)
Patient transported to CT 

## 2022-09-23 NOTE — Assessment & Plan Note (Addendum)
MRI of the brain negative.  Could be secondary to hypercalcemia and dehydration.  Currently no source of infection.

## 2022-09-23 NOTE — Assessment & Plan Note (Addendum)
Insulin sliding scale.  Resume metformin at discharge.  Covered with insulin since hemoglobin A1c 11%. Appreciate diabetes coordinator assistance and patient & family education.  Will discharge with Lantus 8 units at bedtime and his metformin.   Will avoid short acting insulin for now until patient and family comfortable.  Close PCP follow up.

## 2022-09-23 NOTE — Assessment & Plan Note (Addendum)
Replace potassium orally.  And IV with K-Phos.  Recheck potassium today and replace again if low.

## 2022-09-23 NOTE — Consult Note (Signed)
PHARMACY -  BRIEF ANTIBIOTIC NOTE   Pharmacy has received consult(s) for cefepime and vancomycin from an ED provider.  The patient's profile has been reviewed for ht/wt/allergies/indication/available labs.     One time order(s) placed for cefepime 2 g IV and vancomycin 1000 mg IV  Further antibiotics/pharmacy consults should be ordered by admitting physician if indicated.                       Thank you,  Will M. Ouida Sills, PharmD PGY-1 Pharmacy Resident 09/23/2022 1:41 PM

## 2022-09-23 NOTE — Assessment & Plan Note (Signed)
Continue omeprazole 

## 2022-09-23 NOTE — Assessment & Plan Note (Addendum)
Hold Coreg and clonidine.  Norvasc and Cardura okay for blood pressure.

## 2022-09-23 NOTE — ED Triage Notes (Signed)
Pt coming from Home via [facility]. Pt has been lethargic, incontinent and confused for last couple days per EMS. Upon arrival pt was hard to arouse needing a sternal rub to wake. Pt is sleepy during triage questions, nodding off but easily awoken.   Pt given 500 mL NS enroute.  BP 145/106 HR 110 CBG 484

## 2022-09-23 NOTE — ED Notes (Addendum)
Patient transported to MRI, potassium paused at this time.

## 2022-09-23 NOTE — Assessment & Plan Note (Deleted)
Patient has stage III chronic kidney disease secondary to diabetes and hypertension Monitor renal function closely

## 2022-09-23 NOTE — ED Notes (Signed)
Advised nurse that patient has ready ready

## 2022-09-23 NOTE — ED Notes (Signed)
Pt was able to answer all questions correctly however, he does have some memory impairment, stating the same thing multiple times. Pt scored a 0 on NIH scale and passed swallow screen.

## 2022-09-23 NOTE — Assessment & Plan Note (Addendum)
Coreg had to be stopped due to  bradycardia. Stable, no chest pain.

## 2022-09-23 NOTE — Consult Note (Signed)
CODE SEPSIS - PHARMACY COMMUNICATION  **Broad Spectrum Antibiotics should be administered within 1 hour of Sepsis diagnosis**  Time Code Sepsis Called/Page Received: 1338  Antibiotics Ordered: Cefepime, Vancomycin  Time of 1st antibiotic administration: 1402  Additional action taken by pharmacy: N/A  If necessary, Name of Provider/Nurse Contacted: N/A  Will M. Ouida Sills, PharmD PGY-1 Pharmacy Resident 09/23/2022 1:39 PM

## 2022-09-24 ENCOUNTER — Inpatient Hospital Stay: Payer: Medicare HMO

## 2022-09-24 ENCOUNTER — Inpatient Hospital Stay (HOSPITAL_COMMUNITY)
Admit: 2022-09-24 | Discharge: 2022-09-24 | Disposition: A | Discharge status: Home / Self Care | Payer: Medicare HMO | Attending: Internal Medicine | Admitting: Internal Medicine

## 2022-09-24 DIAGNOSIS — I160 Hypertensive urgency: Secondary | ICD-10-CM | POA: Diagnosis not present

## 2022-09-24 DIAGNOSIS — E1165 Type 2 diabetes mellitus with hyperglycemia: Secondary | ICD-10-CM

## 2022-09-24 DIAGNOSIS — K219 Gastro-esophageal reflux disease without esophagitis: Secondary | ICD-10-CM

## 2022-09-24 DIAGNOSIS — E876 Hypokalemia: Secondary | ICD-10-CM

## 2022-09-24 DIAGNOSIS — R7989 Other specified abnormal findings of blood chemistry: Secondary | ICD-10-CM | POA: Diagnosis not present

## 2022-09-24 DIAGNOSIS — R001 Bradycardia, unspecified: Secondary | ICD-10-CM | POA: Diagnosis not present

## 2022-09-24 DIAGNOSIS — N179 Acute kidney failure, unspecified: Secondary | ICD-10-CM | POA: Diagnosis not present

## 2022-09-24 DIAGNOSIS — E872 Acidosis, unspecified: Secondary | ICD-10-CM

## 2022-09-24 HISTORY — DX: Bradycardia, unspecified: R00.1

## 2022-09-24 LAB — BASIC METABOLIC PANEL
Anion gap: 7 (ref 5–15)
BUN: 36 mg/dL — ABNORMAL HIGH (ref 8–23)
CO2: 29 mmol/L (ref 22–32)
Calcium: 10.8 mg/dL — ABNORMAL HIGH (ref 8.9–10.3)
Chloride: 104 mmol/L (ref 98–111)
Creatinine, Ser: 1.38 mg/dL — ABNORMAL HIGH (ref 0.61–1.24)
GFR, Estimated: 52 mL/min — ABNORMAL LOW (ref >60)
Glucose, Bld: 172 mg/dL — ABNORMAL HIGH (ref 70–99)
Potassium: 3.1 mmol/L — ABNORMAL LOW (ref 3.5–5.1)
Sodium: 140 mmol/L (ref 135–145)

## 2022-09-24 LAB — HEMOGLOBIN A1C
Hgb A1c MFr Bld: 11 % — ABNORMAL HIGH (ref 4.8–5.6)
Mean Plasma Glucose: 269 mg/dL

## 2022-09-24 LAB — ECHOCARDIOGRAM COMPLETE
AR max vel: 2.4 cm2
AV Area VTI: 2.85 cm2
AV Area mean vel: 2.34 cm2
AV Mean grad: 3 mmHg
AV Peak grad: 4.8 mmHg
Ao pk vel: 1.1 m/s
Area-P 1/2: 4.74 cm2
Height: 71 in
S' Lateral: 2.8 cm
Weight: 2880 oz

## 2022-09-24 LAB — CBC
HCT: 36.7 % — ABNORMAL LOW (ref 39.0–52.0)
Hemoglobin: 12.8 g/dL — ABNORMAL LOW (ref 13.0–17.0)
MCH: 30.5 pg (ref 26.0–34.0)
MCHC: 34.9 g/dL (ref 30.0–36.0)
MCV: 87.6 fL (ref 80.0–100.0)
Platelets: 233 10*3/uL (ref 150–400)
RBC: 4.19 MIL/uL — ABNORMAL LOW (ref 4.22–5.81)
RDW: 13.3 % (ref 11.5–15.5)
WBC: 11.2 10*3/uL — ABNORMAL HIGH (ref 4.0–10.5)
nRBC: 0 % (ref 0.0–0.2)

## 2022-09-24 LAB — CBG MONITORING, ED
Glucose-Capillary: 163 mg/dL — ABNORMAL HIGH (ref 70–99)
Glucose-Capillary: 174 mg/dL — ABNORMAL HIGH (ref 70–99)
Glucose-Capillary: 220 mg/dL — ABNORMAL HIGH (ref 70–99)
Glucose-Capillary: 102 mg/dL — ABNORMAL HIGH (ref 70–99)

## 2022-09-24 LAB — TROPONIN I (HIGH SENSITIVITY): Troponin I (High Sensitivity): 68 ng/L — ABNORMAL HIGH (ref <18)

## 2022-09-24 LAB — MAGNESIUM: Magnesium: 1.9 mg/dL (ref 1.7–2.4)

## 2022-09-24 MED ORDER — CALCIUM GLUCONATE-NACL 1-0.675 GM/50ML-% IV SOLN
1.0000 g | Freq: Once | INTRAVENOUS | Status: AC
  Administered 2022-09-24: 1000 mg via INTRAVENOUS
  Filled 2022-09-24: qty 50

## 2022-09-24 MED ORDER — GLUCAGON HCL RDNA (DIAGNOSTIC) 1 MG IJ SOLR
5.0000 mg | Freq: Once | INTRAVENOUS | Status: AC
  Administered 2022-09-24: 5 mg via INTRAVENOUS
  Filled 2022-09-24: qty 5

## 2022-09-24 MED ORDER — AMLODIPINE BESYLATE 5 MG PO TABS
5.0000 mg | ORAL_TABLET | Freq: Every day | ORAL | Status: DC
  Administered 2022-09-25: 5 mg via ORAL
  Filled 2022-09-24: qty 1

## 2022-09-24 MED ORDER — SODIUM CHLORIDE 0.9 % IV SOLN: INTRAVENOUS | Status: DC

## 2022-09-24 MED ORDER — POTASSIUM CHLORIDE CRYS ER 20 MEQ PO TBCR: 40.0000 meq | EXTENDED_RELEASE_TABLET | Freq: Once | ORAL | Status: DC

## 2022-09-24 MED ORDER — POTASSIUM CHLORIDE CRYS ER 20 MEQ PO TBCR
40.0000 meq | EXTENDED_RELEASE_TABLET | Freq: Once | ORAL | Status: AC
  Administered 2022-09-24: 40 meq via ORAL
  Filled 2022-09-24: qty 2

## 2022-09-24 NOTE — Progress Notes (Addendum)
Progress Note   Patient: Ryan Kirby:[id number] DOB: 08-28-1941 DOA: 09/23/2022     1 DOS: the patient was seen and examined on 09/24/2022   Brief hospital course: 82 y.o. male with medical history significant for diabetes mellitus with complications of stage III chronic kidney disease, coronary artery disease, hypertension, GERD, osteoarthritis who was sent to the ER by his children for evaluation of mental status changes. Per patient's daughter he had complained about increased frequency of urination 1 day prior to his admission and on the day of his admission his son had gone to check on him since he lives alone and was unable to wake him up and so he called EMS.  Per EMS he required sternal rub to arouse and had a blood pressure of 145/106, blood sugar 484 and heart rate 110. Daughter states that he has had difficulty getting around due to left hip pain and usually ambulates with a cane.  He was seen by his primary care provider and had an x-ray done as an outpatient which showed severe osteoarthritis. Patient states that he does not feel right but has no constitutional symptoms besides constipation.  He denies having any shortness of breath, no chest pain, no headache, no fever, no chills, no cough, no abdominal pain, no leg swelling, no focal deficits or blurred vision. Abnormal labs include white count of 14, lactic acid of 3.0, potassium 2.6, serum creatinine 1.68 compared to baseline of 1.2, calcium level of 12.4, glucose of 403 with no evidence of metabolic acidosis Chest x-ray reviewed by me shows minimal linear density left base likely atelectasis.  CT scan of the brain and cervical spine showed no acute intracranial process. Atrophy and chronic microvascular ischemic changes. No acute cervical spine fracture. Multilevel degenerative changes. Patient received 1 L IV fluid bolus, IV magnesium and potassium supplements as well as a dose of cefepime, vancomycin and metronidazole  MRI of  the brain was negative.  1/22.  Patient was bradycardic.  Calcium has improved down to 10.8.  Creatinine has improved down to 1.38.  Potassium still low at 3.1.  Will continue IV fluids.  Reverse beta-blocker with calcium and glucagon.  Currently I do not have a source of infection so sepsis ruled out.  Holding off on antibiotics.  Repeat chest x-ray does not show pneumonia.  Will get a viral respiratory panel.  Assessment and Plan: * Sinus bradycardia Hold Coreg and clonidine.  Reverse Coreg with calcium and clonidine.  Hypercalcemia Likely secondary to dehydration and not eating very well also hydrochlorothiazide could be contributing.  Send off SPEP, TSH, PTH, PTH RP and vitamin D.  AKI (acute kidney injury) ([place]) Continue IV fluid hydration continue to monitor.  Diabetes mellitus with hyperglycemia (HCC) Insulin sliding scale.  Hold metformin.  Check hemoglobin Q7Y  Acute metabolic encephalopathy MRI of the brain negative.  Could be secondary to hypercalcemia and dehydration.  Will obtain a repeat chest x-ray after hydration and see if he has pneumonia.  Hypokalemia Replace potassium orally.  Magnesium normal range.  Lactic acidosis Likely secondary to metformin.  Continue IV fluid hydration.  Will repeat a chest x-ray to see if a pneumonia shows up.  CAD (coronary artery disease) Holding Coreg with bradycardia.  Hypertensive urgency Hold Coreg and clonidine.  Norvasc and Cardura okay for blood pressure.  GERD (gastroesophageal reflux disease) Continue omeprazole        Subjective: Patient daughter noticed that he has been coughing a lot today.  Patient was a  little sleepy earlier but answers some questions appropriately.  MRI of the brain was negative for stroke.  Admitted with altered mental status.  Today when is in the room the heart rate was down in the 40s.  Calcium was also found to be high.  Physical Exam: Vitals:   09/23/22 2247 09/24/22 0000 09/24/22 0644  09/24/22 0940  BP: (!) 80/62 (!) 91/58 (!) 147/93 (!) 136/91  Pulse: 79 78 78 79  Resp: '17 14 19 '$ (!) 22  Temp: 97.8 F (36.6 C)  98 F (36.7 C)   TempSrc: Oral  Oral   SpO2: 93% 95% 98% 97%  Weight:      Height:       Physical Exam HENT:     Head: Normocephalic.     Mouth/Throat:     Pharynx: No oropharyngeal exudate.  Eyes:     General: Lids are normal.     Conjunctiva/sclera: Conjunctivae normal.  Cardiovascular:     Rate and Rhythm: Normal rate and regular rhythm.     Heart sounds: Normal heart sounds, S1 normal and S2 normal.  Pulmonary:     Breath sounds: No decreased breath sounds, wheezing, rhonchi or rales.  Abdominal:     Palpations: Abdomen is soft.     Tenderness: There is no abdominal tenderness.  Musculoskeletal:     Right lower leg: No swelling.     Left lower leg: No swelling.  Skin:    General: Skin is warm.     Findings: No rash.  Neurological:     Mental Status: He is alert.     Comments: Able to move his extremities on his own.     Data Reviewed: Creatinine 1.38, potassium 3.1, magnesium 1.9, troponin 68, lactic acid 2.9, white blood cell count 11.2, hemoglobin 12.8, platelets 233  Family Communication: Spoke with patient's daughter on the phone  Disposition: Status is: Inpatient Remains inpatient appropriate because: Working up for hypercalcemia and bradycardia.  Planned Discharge Destination: Home    Time spent: 28 minutes  Author: Loletha Grayer, MD 09/24/2022 2:39 PM  For on call review [email or web address].

## 2022-09-24 NOTE — Progress Notes (Signed)
Please let patient know that his hip xray did not show a fracture or dislocation.  The pain is likely coming from the arthritis that is present.  I recommend he see Orthopedics for further evaluation and management of the pain.

## 2022-09-24 NOTE — Hospital Course (Addendum)
82 y.o. male with medical history significant for diabetes mellitus with complications of stage III chronic kidney disease, coronary artery disease, hypertension, GERD, osteoarthritis who was sent to the ER by his children for evaluation of mental status changes. Per patient's daughter he had complained about increased frequency of urination 1 day prior to his admission and on the day of his admission his son had gone to check on him since he lives alone and was unable to wake him up and so he called EMS.  Per EMS he required sternal rub to arouse and had a blood pressure of 145/106, blood sugar 484 and heart rate 110. Daughter states that he has had difficulty getting around due to left hip pain and usually ambulates with a cane.  He was seen by his primary care provider and had an x-ray done as an outpatient which showed severe osteoarthritis. Patient states that he does not feel right but has no constitutional symptoms besides constipation.  He denies having any shortness of breath, no chest pain, no headache, no fever, no chills, no cough, no abdominal pain, no leg swelling, no focal deficits or blurred vision. Abnormal labs include white count of 14, lactic acid of 3.0, potassium 2.6, serum creatinine 1.68 compared to baseline of 1.2, calcium level of 12.4, glucose of 403 with no evidence of metabolic acidosis Chest x-ray reviewed by me shows minimal linear density left base likely atelectasis.  CT scan of the brain and cervical spine showed no acute intracranial process. Atrophy and chronic microvascular ischemic changes. No acute cervical spine fracture. Multilevel degenerative changes. Patient received 1 L IV fluid bolus, IV magnesium and potassium supplements as well as a dose of cefepime, vancomycin and metronidazole  MRI of the brain was negative.  1/22.  Patient was bradycardic.  Calcium has improved down to 10.8.  Creatinine has improved down to 1.38.  Potassium still low at 3.1.  Will  continue IV fluids.  Reverse beta-blocker with calcium and glucagon.  1/23 >> 1/25: required further replacement of potassium and phosphorus.    1/26: additional phos replacement today and will discharge on PO phos replacement until follow up.   Patient seen by diabetes coordinator to discuss insulin given his A1c is 11%.  He and family are agreeable to insulin use at home with family assisting him.

## 2022-09-24 NOTE — Progress Notes (Signed)
*  PRELIMINARY RESULTS* Echocardiogram 2D Echocardiogram has been performed.  Sherrie Sport 09/24/2022, 9:27 AM

## 2022-09-24 NOTE — Assessment & Plan Note (Addendum)
Hold Coreg.  Reversed Coreg with calcium and glucagon.  Heart rate improved from 40s to the 60s and 70s  Stopping Coreg at d/c

## 2022-09-24 NOTE — Consult Note (Signed)
Ryan Kirby       Patient ID: Ryan Kirby MRN: [id number] DOB/AGE: [birth year] 82 y.o.  Admit date: 09/23/2022 Referring Physician Dr. Leslye Peer Primary Physician Jon Billings, NP  Primary Cardiologist prev Dr. Ubaldo Glassing  Reason for Consultation bradycardia   HPI: [facility] Ryan Kirby is an 81yoM with a PMH of CAD s/p CABG x 2 (LIMA - LAD, rev SVG to D1, Dr. Feliz Beam in 2000), HFpEF (EF 55-60%, G1 DD 09/2022), DM2, CKD 3, GERD, HTN who presented to [facility] ED 09/23/2022 at the request of his children for evaluation of mental status changes.  He reportedly had increased frequency of urination 1 day prior to admission, and was difficult to arouse by his children, so EMS was called.  The patient was hypercalcemic with lactic acidosis felt to be secondary to metformin and dehydration/poor oral intake.  Cardiology is consulted on hospital day 2 for assistance with his bradycardia.  The majority of the history is obtained from the chart as the patient is a limited historian.  He tells me that he feels "better today in every way" compared to yesterday when he "everything seems to be wrong."  He currently denies chest pain or shortness of breath, only states he feels fatigued and nods off mid interview.  The patient had reportedly been difficult to arouse by the patient's daughter, required sternal rub per EMS had a blood sugar of 484, blood pressure of 145/106, and was tachycardic with heart rate of 110.  He has had some difficulty getting around due to hip pain, likely secondary to severe osteoarthritis.  He was not having any shortness of breath, chest pain, headache, fever, only complaint was constipation.  Today, the patient was bradycardic with heart rate dipping into the 40s, in sinus rhythm requiring calcium and glucagon to reverse his carvedilol.  His potassium is being repleted, and serum calcium has improved from 12.4 down to 10.8.  Review of systems complete and found to be  negative unless listed above     Past Medical History:  Diagnosis Date   Allergy    Arthritis    CAD (coronary artery disease)    Chronic kidney disease    pt is not on dialysis   Diabetes mellitus without complication ([place])    GERD (gastroesophageal reflux disease)    Hyperlipidemia    Hypertension     Past Surgical History:  Procedure Laterality Date   BOWEL RESECTION  2004   CORONARY ARTERY BYPASS GRAFT     FINGER AMPUTATION Left 2011   5th   INGUINAL HERNIA REPAIR Right 06/01/2020   Procedure: HERNIA REPAIR INGUINAL ADULT, open;  Surgeon: Fredirick Maudlin, MD;  Location: [facility] ORS;  Service: General;  Laterality: Right;   KNEE ARTHROSCOPY Right 09/12/2015   Procedure: ARTHROSCOPY KNEE, LATERAL MENISECTOMY, CHONDROPLASTY;  Surgeon: Dereck Leep, MD;  Location: [facility] ORS;  Service: Orthopedics;  Laterality: Right;   WISDOM TOOTH EXTRACTION      (Not in a hospital admission)  Social History   Socioeconomic History   Marital status: Single    Spouse name: Not on file   Number of children: Not on file   Years of education: Not on file   Highest education level: 12th grade  Occupational History   Occupation: retired  Tobacco Use   Smoking status: Never   Smokeless tobacco: Never  Vaping Use   Vaping Use: Never used  Substance and Sexual Activity   Alcohol use: No    Alcohol/week: 0.0  standard drinks of alcohol   Drug use: No   Sexual activity: Not Currently  Other Topics Concern   Not on file  Social History Narrative   Not on file   Social Determinants of Health   Financial Resource Strain: Low Risk  (04/18/2022)   Overall Financial Resource Strain (CARDIA)    Difficulty of Paying Living Expenses: Not hard at all  Food Insecurity: No Food Insecurity (04/18/2022)   Hunger Vital Sign    Worried About Running Out of Food in the Last Year: Never true    Ran Out of Food in the Last Year: Never true  Transportation Needs: No Transportation Needs (04/18/2022)    PRAPARE - Hydrologist (Medical): No    Lack of Transportation (Non-Medical): No  Physical Activity: Inactive (04/18/2022)   Exercise Vital Sign    Days of Exercise per Week: 0 days    Minutes of Exercise per Session: 0 min  Stress: No Stress Concern Present (04/18/2022)   Person    Feeling of Stress : Not at all  Social Connections: Unknown (04/18/2022)   Social Connection and Isolation Panel [NHANES]    Frequency of Communication with Friends and Family: Twice a week    Frequency of Social Gatherings with Friends and Family: Once a week    Attends Religious Services: More than 4 times per year    Active Member of [employer] or Organizations: No    Attends Archivist Meetings: Never    Marital Status: Not on file  Intimate Partner Violence: Not At Risk (04/18/2022)   Humiliation, Afraid, Rape, and Kick questionnaire    Fear of Current or Ex-Partner: No    Emotionally Abused: No    Physically Abused: No    Sexually Abused: No    Family History  Problem Relation Age of Onset   Arthritis Mother    Hypertension Mother    Breast cancer Mother    Arthritis Father    Hypertension Father    Colon cancer Father       Intake/Output Summary (Last 24 hours) at 09/24/2022 1150 Last data filed at 09/23/2022 2054 Gross per 24 hour  Intake 100 ml  Output 400 ml  Net -300 ml    Vitals:   09/23/22 2247 09/24/22 0000 09/24/22 0644 09/24/22 0940  BP: (!) 80/62 (!) 91/58 (!) 147/93 (!) 136/91  Pulse: 79 78 78 79  Resp: '17 14 19 '$ (!) 22  Temp: 97.8 F (36.6 C)  98 F (36.7 C)   TempSrc: Oral  Oral   SpO2: 93% 95% 98% 97%  Weight:      Height:        PHYSICAL EXAM General: Elderly and ill-appearing Caucasian male, well nourished, in no acute distress.  Sitting upright in hospital bed in the emergency department, resting with eyes closed, but awakens easily to voice.  HEENT:   Normocephalic and atraumatic. Neck:  No JVD.  Lungs: Normal respiratory effort on room air. Clear bilaterally to auscultation. No wheezes, crackles, rhonchi.  Heart: Bradycardic but regular. Normal S1 and S2 without gallops or murmurs.  Abdomen: Non-distended appearing.  Msk: Normal strength and tone for age. Extremities: Warm and well perfused. No clubbing, cyanosis.  No peripheral edema.  Neuro: Alert and oriented to self, place, year. Psych:  Answers questions appropriately, but is a limited historian.   Labs: Basic Metabolic Panel: Recent Labs    09/23/22 1337 09/23/22  1437 09/24/22 0359  NA 139  --  140  K 2.6*  --  3.1*  CL 96*  --  104  CO2 26  --  29  GLUCOSE 403*  --  172*  BUN 41*  --  36*  CREATININE 1.68*  --  1.38*  CALCIUM 12.4*  --  10.8*  MG  --  1.9  --    Liver Function Tests: Recent Labs    09/23/22 1337  AST 36  ALT 14  ALKPHOS 74  BILITOT 1.4*  PROT 6.9  ALBUMIN 3.7   No results for input(s): "LIPASE", "AMYLASE" in the last 72 hours. CBC: Recent Labs    09/23/22 1237 09/24/22 0359  WBC 14.0* 11.2*  NEUTROABS 12.8*  --   HGB 16.2 12.8*  HCT 45.9 36.7*  MCV 86.9 87.6  PLT 357 233   Cardiac Enzymes: Recent Labs    09/23/22 1553 09/23/22 1752 09/24/22 0359  CKTOTAL 65  --   --   TROPONINIHS 79* 85* 68*   BNP: No results for input(s): "BNP" in the last 72 hours. D-Dimer: No results for input(s): "DDIMER" in the last 72 hours. Hemoglobin A1C: No results for input(s): "HGBA1C" in the last 72 hours. Fasting Lipid Panel: No results for input(s): "CHOL", "HDL", "LDLCALC", "TRIG", "CHOLHDL", "LDLDIRECT" in the last 72 hours. Thyroid Function Tests: No results for input(s): "TSH", "T4TOTAL", "T3FREE", "THYROIDAB" in the last 72 hours.  Invalid input(s): "FREET3" Anemia Panel: No results for input(s): "VITAMINB12", "FOLATE", "FERRITIN", "TIBC", "IRON", "RETICCTPCT" in the last 72 hours.   Radiology: ECHOCARDIOGRAM COMPLETE  Result  Date: 09/24/2022    ECHOCARDIOGRAM REPORT   Patient Name:   Ryan Kirby Date of Exam: 09/24/2022 Medical Rec #:  [id number]      Height:       71.0 in Accession #:    [id number]     Weight:       180.0 lb Date of Birth:  [birth year]       BSA:          2.016 m Patient Age:    61 years       BP:           147/93 mmHg Patient Gender: M              HR:           78 bpm. Exam Location:  [facility] Procedure: 2D Echo, Cardiac Doppler and Color Doppler Indications:     Elevated troponin  History:         Patient has no prior history of Echocardiogram examinations.                  CAD; Risk Factors:Hypertension, Diabetes and Dyslipidemia.  Sonographer:     Sherrie Sport Referring Phys:  Dana Diagnosing Phys: Kathlyn Sacramento MD  Sonographer Comments: Technically challenging study due to limited acoustic windows. Image acquisition challenging due to uncooperative patient. IMPRESSIONS  1. Left ventricular ejection fraction, by estimation, is 55 to 60%. The left ventricle has normal function. The left ventricle has no regional wall motion abnormalities. There is mild left ventricular hypertrophy. Left ventricular diastolic parameters are consistent with Grade I diastolic dysfunction (impaired relaxation).  2. Right ventricular systolic function is normal. The right ventricular size is normal.  3. Left atrial size was mildly dilated.  4. The mitral valve is normal in structure. No evidence of mitral valve regurgitation. No evidence of mitral stenosis.  5. The  aortic valve is normal in structure. Aortic valve regurgitation is mild. Aortic valve sclerosis/calcification is present, without any evidence of aortic stenosis. FINDINGS  Left Ventricle: Left ventricular ejection fraction, by estimation, is 55 to 60%. The left ventricle has normal function. The left ventricle has no regional wall motion abnormalities. The left ventricular internal cavity size was normal in size. There is  mild left ventricular hypertrophy. Left  ventricular diastolic parameters are consistent with Grade I diastolic dysfunction (impaired relaxation). Right Ventricle: The right ventricular size is normal. No increase in right ventricular wall thickness. Right ventricular systolic function is normal. Left Atrium: Left atrial size was mildly dilated. Right Atrium: Right atrial size was normal in size. Pericardium: There is no evidence of pericardial effusion. Mitral Valve: The mitral valve is normal in structure. No evidence of mitral valve regurgitation. No evidence of mitral valve stenosis. Tricuspid Valve: The tricuspid valve is normal in structure. Tricuspid valve regurgitation is trivial. No evidence of tricuspid stenosis. Aortic Valve: The aortic valve is normal in structure. Aortic valve regurgitation is mild. Aortic valve sclerosis/calcification is present, without any evidence of aortic stenosis. Aortic valve mean gradient measures 3.0 mmHg. Aortic valve peak gradient measures 4.8 mmHg. Aortic valve area, by VTI measures 2.85 cm. Pulmonic Valve: The pulmonic valve was normal in structure. Pulmonic valve regurgitation is not visualized. No evidence of pulmonic stenosis. Aorta: The aortic root is normal in size and structure. Venous: The inferior vena cava was not well visualized. IAS/Shunts: No atrial level shunt detected by color flow Doppler.  LEFT VENTRICLE PLAX 2D LVIDd:         3.90 cm   Diastology LVIDs:         2.80 cm   LV e' medial:    3.92 cm/s LV PW:         0.90 cm   LV E/e' medial:  13.0 LV IVS:        1.50 cm   LV e' lateral:   6.31 cm/s LVOT diam:     2.00 cm   LV E/e' lateral: 8.1 LV SV:         57 LV SV Index:   28 LVOT Area:     3.14 cm  RIGHT VENTRICLE RV Basal diam:  3.70 cm RV Mid diam:    3.80 cm RV S prime:     13.20 cm/s LEFT ATRIUM           Index        RIGHT ATRIUM           Index LA diam:      4.30 cm 2.13 cm/m   RA Area:     15.50 cm LA Vol (A2C): 19.0 ml 9.42 ml/m   RA Volume:   35.70 ml  17.71 ml/m LA Vol (A4C): 35.8  ml 17.76 ml/m  AORTIC VALVE AV Area (Vmax):    2.40 cm AV Area (Vmean):   2.34 cm AV Area (VTI):     2.85 cm AV Vmax:           109.50 cm/s AV Vmean:          76.950 cm/s AV VTI:            0.200 m AV Peak Grad:      4.8 mmHg AV Mean Grad:      3.0 mmHg LVOT Vmax:         83.80 cm/s LVOT Vmean:        57.400 cm/s  LVOT VTI:          0.181 m LVOT/AV VTI ratio: 0.91  AORTA Ao Root diam: 2.60 cm MITRAL VALVE                TRICUSPID VALVE MV Area (PHT): 4.74 cm     TR Peak grad:   26.6 mmHg MV Decel Time: 160 msec     TR Vmax:        258.00 cm/s MV E velocity: 50.80 cm/s MV A velocity: 109.00 cm/s  SHUNTS MV E/A ratio:  0.47         Systemic VTI:  0.18 m                             Systemic Diam: 2.00 cm Kathlyn Sacramento MD Electronically signed by Kathlyn Sacramento MD Signature Date/Time: 09/24/2022/11:25:06 AM    Final    MR BRAIN WO CONTRAST  Result Date: 09/23/2022 CLINICAL DATA:  Mental status change, unknown cause EXAM: MRI HEAD WITHOUT CONTRAST TECHNIQUE: Multiplanar, multiecho pulse sequences of the brain and surrounding structures were obtained without intravenous contrast. COMPARISON:  CT head from the same day. FINDINGS: Motion limited study.  Within this limitation: Brain: No acute infarction, hemorrhage, hydrocephalus, extra-axial collection or mass lesion. Cerebral atrophy. Mild for age chronic microvascular ischemic disease. Vascular: Major arterial flow voids are maintained at the skull base. Skull and upper cervical spine: Normal marrow signal. Sinuses/Orbits: Left sphenoid sinus mucosal. Otherwise, clear sinuses. No acute orbital findings. Other: No mastoid effusions. IMPRESSION: Motion limited study without evidence of acute intracranial abnormality. Electronically Signed   By: Margaretha Sheffield M.D.   On: 09/23/2022 17:26   CT Head Wo Contrast  Result Date: 09/23/2022 CLINICAL DATA:  Patient with lethargy. EXAM: CT HEAD WITHOUT CONTRAST CT CERVICAL SPINE WITHOUT CONTRAST TECHNIQUE:  Multidetector CT imaging of the head and cervical spine was performed following the standard protocol without intravenous contrast. Multiplanar CT image reconstructions of the cervical spine were also generated. RADIATION DOSE REDUCTION: This exam was performed according to the departmental dose-optimization program which includes automated exposure control, adjustment of the mA and/or kV according to patient size and/or use of iterative reconstruction technique. COMPARISON:  None Available. FINDINGS: CT HEAD FINDINGS Brain: Ventricles and sulci are prominent compatible with atrophy. Periventricular and subcortical white matter hypodensities compatible with chronic microvascular ischemic changes. No evidence for acute cortically based infarct, intracranial hemorrhage, mass lesion or mass-effect. Vascular: No hyperdense vessel or unexpected calcification. Skull: Normal. Negative for fracture or focal lesion. Sinuses/Orbits: Mucosal thickening involving the sphenoid sinus. Remainder of the paranasal sinuses well aerated. Mastoid air cells unremarkable. Other: None CT CERVICAL SPINE FINDINGS Alignment: Grade 1 anterolisthesis of C4 on C5 from facet degenerative changes at this level. Skull base and vertebrae: No acute fracture. No primary bone lesion or focal pathologic process. Soft tissues and spinal canal: Prevertebral soft tissues unremarkable. Posterior disc osteophyte complex at the C3-4 level impresses upon the ventral thecal sac. Disc levels: Multilevel degenerative disc disease throughout the visualized cervical spine. Multilevel posterior disc osteophyte complexes, most pronounced at the C3-4 level. No acute fracture. Upper chest: Unremarkable Other: Bilateral carotid arterial vascular calcifications. IMPRESSION: 1. No acute intracranial process. Atrophy and chronic microvascular ischemic changes. 2. No acute cervical spine fracture. Multilevel degenerative changes. Electronically Signed   By: Lovey Newcomer  M.D.   On: 09/23/2022 13:40   CT Cervical Spine Wo Contrast  Result Date: 09/23/2022 CLINICAL  DATA:  Patient with lethargy. EXAM: CT HEAD WITHOUT CONTRAST CT CERVICAL SPINE WITHOUT CONTRAST TECHNIQUE: Multidetector CT imaging of the head and cervical spine was performed following the standard protocol without intravenous contrast. Multiplanar CT image reconstructions of the cervical spine were also generated. RADIATION DOSE REDUCTION: This exam was performed according to the departmental dose-optimization program which includes automated exposure control, adjustment of the mA and/or kV according to patient size and/or use of iterative reconstruction technique. COMPARISON:  None Available. FINDINGS: CT HEAD FINDINGS Brain: Ventricles and sulci are prominent compatible with atrophy. Periventricular and subcortical white matter hypodensities compatible with chronic microvascular ischemic changes. No evidence for acute cortically based infarct, intracranial hemorrhage, mass lesion or mass-effect. Vascular: No hyperdense vessel or unexpected calcification. Skull: Normal. Negative for fracture or focal lesion. Sinuses/Orbits: Mucosal thickening involving the sphenoid sinus. Remainder of the paranasal sinuses well aerated. Mastoid air cells unremarkable. Other: None CT CERVICAL SPINE FINDINGS Alignment: Grade 1 anterolisthesis of C4 on C5 from facet degenerative changes at this level. Skull base and vertebrae: No acute fracture. No primary bone lesion or focal pathologic process. Soft tissues and spinal canal: Prevertebral soft tissues unremarkable. Posterior disc osteophyte complex at the C3-4 level impresses upon the ventral thecal sac. Disc levels: Multilevel degenerative disc disease throughout the visualized cervical spine. Multilevel posterior disc osteophyte complexes, most pronounced at the C3-4 level. No acute fracture. Upper chest: Unremarkable Other: Bilateral carotid arterial vascular calcifications.  IMPRESSION: 1. No acute intracranial process. Atrophy and chronic microvascular ischemic changes. 2. No acute cervical spine fracture. Multilevel degenerative changes. Electronically Signed   By: Lovey Newcomer M.D.   On: 09/23/2022 13:40   DG Chest Portable 1 View  Result Date: 09/23/2022 CLINICAL DATA:  Lethargic. EXAM: PORTABLE CHEST 1 VIEW COMPARISON:  None Available. FINDINGS: Sternotomy wires are present. Lungs are adequately inflated with minimal linear density left base likely atelectasis. Cardiomediastinal silhouette is unremarkable. Degenerative change of the spine. IMPRESSION: Minimal linear density left base likely atelectasis. Electronically Signed   By: Marin Olp M.D.   On: 09/23/2022 13:03   DG Hip Unilat W OR W/O Pelvis 2-3 Views Left  Result Date: 09/21/2022 CLINICAL DATA:  Status post fall a couple of weeks ago with subsequent left hip pain. EXAM: DG HIP (WITH OR WITHOUT PELVIS) 2-3V LEFT COMPARISON:  None Available. FINDINGS: There is no evidence of an acute hip fracture or dislocation. Moderate severity degenerative changes are seen in the form of joint space narrowing and acetabular sclerosis. There is moderate severity vascular calcification. IMPRESSION: Moderate severity degenerative changes without evidence of an acute osseous abnormality. Electronically Signed   By: Virgina Norfolk M.D.   On: 09/21/2022 16:54    TELEMETRY reviewed by me (LT) 09/24/2022 : Sinus bradycardia to sinus rhythm rate 48-70s  EKG reviewed by me: Sinus tachycardia rate 116 on 09/23/2022  Data reviewed by me (LT) 09/24/2022: Last cardiology Kirby, hospitalist progress Kirby, CBC BMP troponins vitals telemetry EKGs  Principal Problem:   Acute metabolic encephalopathy Active Problems:   CAD (coronary artery disease)   GERD (gastroesophageal reflux disease)   CKD stage 3 secondary to diabetes ([place])   Hypokalemia   Diabetes mellitus with hyperglycemia (H[facility])   Hypercalcemia   AKI (acute kidney injury)  ([place])   Lactic acidosis   Hypertensive urgency   Elevated troponin    ASSESSMENT AND PLAN:  Ryan Kirby is an 59yoM with a PMH of CAD s/p CABG x 2 (LIMA - LAD, rev SVG to D1,  Dr. Feliz Beam in 2000), HFpEF (EF 55-60%, G1 DD 09/2022), DM2, CKD 3, GERD, HTN who presented to [facility] ED 09/23/2022 at the request of his children for evaluation of mental status changes.  He reportedly had increased frequency of urination 1 day prior to admission, and was difficult to arouse by his children, so EMS was called.  The patient was hypercalcemic with lactic acidosis felt to be secondary to metformin and dehydration/poor oral intake.  Cardiology is consulted on hospital day 2 for assistance with his bradycardia.  # Sinus bradycardia Seen on telemetry today, in the setting of hypokalemia, hypercalcemia which are being corrected, carvedilol 25 mg also reversed with glucagon today, no evidence of sinus pauses, or high-grade AV block on telemetry.  He has been in sinus bradycardia with rates reportedly as low of the 40s, primarily in the 50s-with periods of sinus rhythm up to 60-70, seemingly asymptomatic from this. -Agree with holding carvedilol and clonidine for now -Continue to monitor and correct electrolytes -No indication for temporary transvenous pacemaker, or permanent pacemaker at this time -Continuous monitoring on telemetry while inpatient  # Acute encephalopathy # Hypercalcemia # Lactic acidosis No source of infection identified per primary team, felt to be related to poor p.o. intake, metformin use Mental status improving, agree with continuing IVF  # CAD s/p CABG x 2 (2000) # Demand ischemia No current chest pain, troponins are minimally elevated and trending 80, 75, 79, 85, 68, in the absence of chest pain or ischemic EKG changes this is most consistent with demand/supply mismatch and not ACS. Continue aspirin, pravastatin 40 mg nightly  This patient's plan of care was discussed and created  with Dr. Saralyn Pilar and he is in agreement.  Signed: Tristan Schroeder , PA-C 09/24/2022, 11:50 AM [facility]

## 2022-09-24 NOTE — Inpatient Diabetes Management (Signed)
Inpatient Diabetes Program Recommendations  AACE/ADA: New Consensus Statement on Inpatient Glycemic Control (2015)  Target Ranges:  Prepandial:   less than 140 mg/dL      Peak postprandial:   less than 180 mg/dL (1-2 hours)      Critically ill patients:  140 - 180 mg/dL   Lab Results  Component Value Date   GLUCAP 163 (H) 09/24/2022   HGBA1C 7.6 (H) 07/05/2022    Review of Glycemic Control  Latest Reference Range & Units 09/23/22 13:37  Glucose 70 - 99 mg/dL 403 (H)  (H): Data is abnormally high  Diabetes history: DM2 Outpatient Diabetes medications: Metformin 500 mg BID Current orders for Inpatient glycemic control: Novolog 0-15 units TID and 0-5 units QHS  Inpatient Diabetes Program Recommendations:    Last A1C was 7.6% on 07/05/22.  Please consider obtaining a current A1C given serum glucose was 403 mg/dL on presentation to ED.   Will continue to follow while inpatient.  Thank you, Reche Dixon, MSN, [place] Diabetes Coordinator Inpatient Diabetes Program [phone] (team pager from 8a-5p)

## 2022-09-25 DIAGNOSIS — R531 Weakness: Secondary | ICD-10-CM | POA: Diagnosis not present

## 2022-09-25 DIAGNOSIS — E876 Hypokalemia: Secondary | ICD-10-CM | POA: Diagnosis not present

## 2022-09-25 LAB — BASIC METABOLIC PANEL
Anion gap: 9 (ref 5–15)
BUN: 33 mg/dL — ABNORMAL HIGH (ref 8–23)
CO2: 24 mmol/L (ref 22–32)
Calcium: 9.6 mg/dL (ref 8.9–10.3)
Chloride: 107 mmol/L (ref 98–111)
Creatinine, Ser: 1.09 mg/dL (ref 0.61–1.24)
GFR, Estimated: >60 mL/min (ref >60)
Glucose, Bld: 90 mg/dL (ref 70–99)
Potassium: 2.6 mmol/L — CL (ref 3.5–5.1)
Sodium: 140 mmol/L (ref 135–145)

## 2022-09-25 LAB — RESPIRATORY PANEL BY PCR

## 2022-09-25 LAB — POTASSIUM: Potassium: 3.5 mmol/L (ref 3.5–5.1)

## 2022-09-25 LAB — CBC
HCT: 34.5 % — ABNORMAL LOW (ref 39.0–52.0)
Hemoglobin: 11.4 g/dL — ABNORMAL LOW (ref 13.0–17.0)
MCH: 30.3 pg (ref 26.0–34.0)
MCHC: 33 g/dL (ref 30.0–36.0)
MCV: 91.8 fL (ref 80.0–100.0)
Platelets: 171 10*3/uL (ref 150–400)
RBC: 3.76 MIL/uL — ABNORMAL LOW (ref 4.22–5.81)
RDW: 13.2 % (ref 11.5–15.5)
WBC: 6.8 10*3/uL (ref 4.0–10.5)
nRBC: 0 % (ref 0.0–0.2)

## 2022-09-25 LAB — CBG MONITORING, ED
Glucose-Capillary: 93 mg/dL (ref 70–99)
Glucose-Capillary: 197 mg/dL — ABNORMAL HIGH (ref 70–99)
Glucose-Capillary: 218 mg/dL — ABNORMAL HIGH (ref 70–99)
Glucose-Capillary: 394 mg/dL — ABNORMAL HIGH (ref 70–99)
Glucose-Capillary: 158 mg/dL — ABNORMAL HIGH (ref 70–99)
Glucose-Capillary: 161 mg/dL — ABNORMAL HIGH (ref 70–99)

## 2022-09-25 LAB — PARATHYROID HORMONE, INTACT (NO CA): PTH: 19 pg/mL (ref 15–65)

## 2022-09-25 LAB — TSH: TSH: 0.388 u[IU]/mL (ref 0.350–4.500)

## 2022-09-25 LAB — LACTIC ACID, PLASMA: Lactic Acid, Venous: 0.8 mmol/L (ref 0.5–1.9)

## 2022-09-25 LAB — PHOSPHORUS: Phosphorus: 1.8 mg/dL — ABNORMAL LOW (ref 2.5–4.6)

## 2022-09-25 LAB — MAGNESIUM: Magnesium: 1.9 mg/dL (ref 1.7–2.4)

## 2022-09-25 MED ORDER — INSULIN GLARGINE-YFGN 100 UNIT/ML ~~LOC~~ SOLN
5.0000 [IU] | Freq: Every day | SUBCUTANEOUS | Status: DC
  Administered 2022-09-25 – 2022-09-27 (×3): 5 [IU] via SUBCUTANEOUS
  Filled 2022-09-25 (×4): qty 0.05

## 2022-09-25 MED ORDER — AMLODIPINE BESYLATE 5 MG PO TABS
5.0000 mg | ORAL_TABLET | Freq: Once | ORAL | Status: AC
  Administered 2022-09-25: 5 mg via ORAL
  Filled 2022-09-25: qty 1

## 2022-09-25 MED ORDER — PANTOPRAZOLE SODIUM 20 MG PO TBEC
20.0000 mg | DELAYED_RELEASE_TABLET | Freq: Two times a day (BID) | ORAL | Status: DC
  Administered 2022-09-25 – 2022-09-28 (×6): 20 mg via ORAL
  Filled 2022-09-25 (×8): qty 1

## 2022-09-25 MED ORDER — AMLODIPINE BESYLATE 10 MG PO TABS
10.0000 mg | ORAL_TABLET | Freq: Every day | ORAL | Status: DC
  Administered 2022-09-26 – 2022-09-28 (×3): 10 mg via ORAL
  Filled 2022-09-25 (×3): qty 1

## 2022-09-25 MED ORDER — POTASSIUM CHLORIDE CRYS ER 20 MEQ PO TBCR
40.0000 meq | EXTENDED_RELEASE_TABLET | Freq: Once | ORAL | Status: AC
  Administered 2022-09-25: 40 meq via ORAL
  Filled 2022-09-25: qty 2

## 2022-09-25 MED ORDER — MAGNESIUM SULFATE 2 GM/50ML IV SOLN
2.0000 g | INTRAVENOUS | Status: AC
  Administered 2022-09-25: 2 g via INTRAVENOUS
  Filled 2022-09-25: qty 50

## 2022-09-25 MED ORDER — POTASSIUM PHOSPHATES 15 MMOLE/5ML IV SOLN
30.0000 mmol | Freq: Once | INTRAVENOUS | Status: AC
  Administered 2022-09-25: 30 mmol via INTRAVENOUS
  Filled 2022-09-25: qty 10

## 2022-09-25 NOTE — Progress Notes (Signed)
Progress Note   Patient: Ryan Kirby:[id number] DOB: [birth year] DOA: 09/23/2022     2 DOS: the patient was seen and examined on 09/25/2022   Brief hospital course: 82 y.o. male with medical history significant for diabetes mellitus with complications of stage III chronic kidney disease, coronary artery disease, hypertension, GERD, osteoarthritis who was sent to the ER by his children for evaluation of mental status changes. Per patient's daughter he had complained about increased frequency of urination 1 day prior to his admission and on the day of his admission his son had gone to check on him since he lives alone and was unable to wake him up and so he called EMS.  Per EMS he required sternal rub to arouse and had a blood pressure of 145/106, blood sugar 484 and heart rate 110. Daughter states that he has had difficulty getting around due to left hip pain and usually ambulates with a cane.  He was seen by his primary care provider and had an x-ray done as an outpatient which showed severe osteoarthritis. Patient states that he does not feel right but has no constitutional symptoms besides constipation.  He denies having any shortness of breath, no chest pain, no headache, no fever, no chills, no cough, no abdominal pain, no leg swelling, no focal deficits or blurred vision. Abnormal labs include white count of 14, lactic acid of 3.0, potassium 2.6, serum creatinine 1.68 compared to baseline of 1.2, calcium level of 12.4, glucose of 403 with no evidence of metabolic acidosis Chest x-ray reviewed by me shows minimal linear density left base likely atelectasis.  CT scan of the brain and cervical spine showed no acute intracranial process. Atrophy and chronic microvascular ischemic changes. No acute cervical spine fracture. Multilevel degenerative changes. Patient received 1 L IV fluid bolus, IV magnesium and potassium supplements as well as a dose of cefepime, vancomycin and metronidazole  MRI of  the brain was negative.  1/22.  Patient was bradycardic.  Calcium has improved down to 10.8.  Creatinine has improved down to 1.38.  Potassium still low at 3.1.  Will continue IV fluids.  Reverse beta-blocker with calcium and glucagon.  Assessment and Plan: * Hypokalemia Replace potassium orally.  And IV with K-Phos.  Recheck potassium today and replace again if low.  Hypophosphatemia K-Phos IV.  Hypercalcemia Likely secondary to dehydration and not eating very well also hydrochlorothiazide could be contributing.  Send off SPEP, PTH RP and vitamin D.  PTH normal range which rules out primary hyperparathyroidism.  TSH 0.388.  Sinus bradycardia Hold Coreg and clonidine.  Reversed Coreg with calcium and glucagon.  Heart rate improved from 40s yesterday into the 60s and 70s today.  AKI (acute kidney injury) ([place]) Continue IV fluid hydration continue to monitor.  Creatinine improved from 1.68 down to 1.09  Diabetes mellitus with hyperglycemia (HCC) Insulin sliding scale.  Hold metformin.  Start low-dose Semglee insulin since hemoglobin A1c 11.  Acute metabolic encephalopathy MRI of the brain negative.  Could be secondary to hypercalcemia and dehydration.  Currently no source of infection.  Lactic acidosis Likely secondary to metformin.  Improved with hydration.  CAD (coronary artery disease) Holding Coreg with bradycardia.  Hypertensive urgency Hold Coreg and clonidine.  Norvasc and Cardura okay for blood pressure.  GERD (gastroesophageal reflux disease) Continue omeprazole        Subjective: Patient feels weak.  Has some cough.  Physical Exam: Vitals:   09/24/22 2130 09/25/22 0400 09/25/22 0800 09/25/22 1200  BP: (!) 120/96 121/68 (!) 162/89 (!) 150/80  Pulse: 83 79 78 62  Resp: 10 16 (!) 21 14  Temp: 97.9 F (36.6 C) 98 F (36.7 C) 97.6 F (36.4 C)   TempSrc:  Oral Oral   SpO2: 96% 97% 100% 99%  Weight:      Height:       Physical Exam HENT:     Head:  Normocephalic.     Mouth/Throat:     Pharynx: No oropharyngeal exudate.  Eyes:     General: Lids are normal.     Conjunctiva/sclera: Conjunctivae normal.  Cardiovascular:     Rate and Rhythm: Normal rate and regular rhythm.     Heart sounds: Normal heart sounds, S1 normal and S2 normal.  Pulmonary:     Breath sounds: No decreased breath sounds, wheezing, rhonchi or rales.  Abdominal:     Palpations: Abdomen is soft.     Tenderness: There is no abdominal tenderness.  Musculoskeletal:     Right lower leg: No swelling.     Left lower leg: No swelling.  Skin:    General: Skin is warm.     Findings: No rash.  Neurological:     Mental Status: He is alert.     Comments: Able to straight leg raise.     Data Reviewed: Hemoglobin A1c elevated 11, creatinine 1.09, potassium 2.6, phosphorus 1.8, magnesium 1.9, lactic acid 0.8, hemoglobin 11.4, PTH 19.  Family Communication: Spoke with daughter  Disposition: Status is: Inpatient Remains inpatient appropriate because: Replacing electrolytes today IV and orally.  Will need to see how he does with physical therapy  Planned Discharge Destination: To be determined    Time spent: 28 minutes  Author: Loletha Grayer, MD 09/25/2022 1:05 PM  For on call review [email or web address].

## 2022-09-25 NOTE — Assessment & Plan Note (Addendum)
IV K-phos today for phos 1.8 (improved from 1.4 yesterday) Suspect Refeeding syndrome

## 2022-09-25 NOTE — ED Notes (Signed)
CBG was 158, no other needs from patient at this time

## 2022-09-25 NOTE — Progress Notes (Signed)
Ryan Kirby       Patient ID: Ryan Kirby MRN: [id number] DOB/AGE: 81-21-1942 81 y.o.  Admit date: 09/23/2022 Referring Physician Dr. Leslye Peer Primary Physician Jon Billings, NP  Primary Cardiologist prev Dr. Ubaldo Glassing  Reason for Consultation bradycardia   HPI: [facility] Ryan Kirby is an 82yoM with a PMH of CAD s/p CABG x 2 (LIMA - LAD, rev SVG to D1, Dr. Feliz Beam in 2000), HFpEF (EF 55-60%, G1 DD 09/2022), DM2, CKD 3, GERD, HTN who presented to [facility] ED 09/23/2022 at the request of his children for evaluation of mental status changes.  He reportedly had increased frequency of urination 1 day prior to admission, and was difficult to arouse by his children, so EMS was called.  The patient was hypercalcemic with lactic acidosis felt to be secondary to metformin and dehydration/poor oral intake.  Cardiology is consulted on hospital day 2 for assistance with his bradycardia.  Interval History:  - no acute events - in sinus rhythm on tele, no evidence of pauses, AV block or arrhythmias  - feels ok, primarily fatigued. No chest pain or shortness of breath    Review of systems complete and found to be negative unless listed above     Past Medical History:  Diagnosis Date   Allergy    Arthritis    CAD (coronary artery disease)    Chronic kidney disease    pt is not on dialysis   Diabetes mellitus without complication ([place])    GERD (gastroesophageal reflux disease)    Hyperlipidemia    Hypertension     Past Surgical History:  Procedure Laterality Date   BOWEL RESECTION  2004   CORONARY ARTERY BYPASS GRAFT     FINGER AMPUTATION Left 2011   5th   INGUINAL HERNIA REPAIR Right 06/01/2020   Procedure: HERNIA REPAIR INGUINAL ADULT, open;  Surgeon: Fredirick Maudlin, MD;  Location: [facility] ORS;  Service: General;  Laterality: Right;   KNEE ARTHROSCOPY Right 09/12/2015   Procedure: ARTHROSCOPY KNEE, LATERAL MENISECTOMY, CHONDROPLASTY;  Surgeon: Dereck Leep, MD;  Location:  [facility] ORS;  Service: Orthopedics;  Laterality: Right;   WISDOM TOOTH EXTRACTION      (Not in a hospital admission)  Social History   Socioeconomic History   Marital status: Single    Spouse name: Not on file   Number of children: Not on file   Years of education: Not on file   Highest education level: 12th grade  Occupational History   Occupation: retired  Tobacco Use   Smoking status: Never   Smokeless tobacco: Never  Vaping Use   Vaping Use: Never used  Substance and Sexual Activity   Alcohol use: No    Alcohol/week: 0.0 standard drinks of alcohol   Drug use: No   Sexual activity: Not Currently  Other Topics Concern   Not on file  Social History Narrative   Not on file   Social Determinants of Health   Financial Resource Strain: Low Risk  (04/18/2022)   Overall Financial Resource Strain (CARDIA)    Difficulty of Paying Living Expenses: Not hard at all  Food Insecurity: No Food Insecurity (04/18/2022)   Hunger Vital Sign    Worried About Running Out of Food in the Last Year: Never true    [place] in the Last Year: Never true  Transportation Needs: No Transportation Needs (04/18/2022)   PRAPARE - Hydrologist (Medical): No    Lack of Transportation (  Non-Medical): No  Physical Activity: Inactive (04/18/2022)   Exercise Vital Sign    Days of Exercise per Week: 0 days    Minutes of Exercise per Session: 0 min  Stress: No Stress Concern Present (04/18/2022)   [place]    Feeling of Stress : Not at all  Social Connections: Unknown (04/18/2022)   Social Connection and Isolation Panel [NHANES]    Frequency of Communication with Friends and Family: Twice a week    Frequency of Social Gatherings with Friends and Family: Once a week    Attends Religious Services: More than 4 times per year    Active Member of [employer] or Organizations: No    Attends Archivist  Meetings: Never    Marital Status: Not on file  Intimate Partner Violence: Not At Risk (04/18/2022)   Humiliation, Afraid, Rape, and Kick questionnaire    Fear of Current or Ex-Partner: No    Emotionally Abused: No    Physically Abused: No    Sexually Abused: No    Family History  Problem Relation Age of Onset   Arthritis Mother    Hypertension Mother    Breast cancer Mother    Arthritis Father    Hypertension Father    Colon cancer Father      No intake or output data in the 24 hours ending 09/25/22 0802   Vitals:   09/24/22 1800 09/24/22 2000 09/24/22 2130 09/25/22 0400  BP:  (!) 117/95 (!) 120/96 121/68  Pulse: (!) 54 66 83 79  Resp: (!) '8 10 10 16  '$ Temp: 97.7 F (36.5 C) 97.8 F (36.6 C) 97.9 F (36.6 C) 98 F (36.7 C)  TempSrc: Oral   Oral  SpO2: 97% 98% 96% 97%  Weight:      Height:        PHYSICAL EXAM General: Elderly and ill-appearing Caucasian male, well nourished, in no acute distress.  Sitting upright in hospital bed in the emergency department, resting with eyes closed, but awakens easily to voice.  HEENT:  Normocephalic and atraumatic. Neck:  No JVD.  Lungs: Normal respiratory effort on room air. Clear bilaterally to auscultation. No wheezes, crackles, rhonchi.  Heart: regular rate and rhythm. Normal S1 and S2 without gallops or murmurs.  Abdomen: Non-distended appearing.  Msk: Normal strength and tone for age. Extremities: Warm and well perfused. No clubbing, cyanosis.  No peripheral edema.  Neuro: Alert and oriented to self, place, year. Psych:  Answers questions appropriately, but is a limited historian.   Labs: Basic Metabolic Panel: Recent Labs    09/24/22 0359 09/25/22 0348  NA 140 140  K 3.1* 2.6*  CL 104 107  CO2 29 24  GLUCOSE 172* 90  BUN 36* 33*  CREATININE 1.38* 1.09  CALCIUM 10.8* 9.6  MG 1.9 1.9  PHOS  --  1.8*    Liver Function Tests: Recent Labs    09/23/22 1337  AST 36  ALT 14  ALKPHOS 74  BILITOT 1.4*  PROT 6.9   ALBUMIN 3.7    No results for input(s): "LIPASE", "AMYLASE" in the last 72 hours. CBC: Recent Labs    09/23/22 1237 09/24/22 0359 09/25/22 0348  WBC 14.0* 11.2* 6.8  NEUTROABS 12.8*  --   --   HGB 16.2 12.8* 11.4*  HCT 45.9 36.7* 34.5*  MCV 86.9 87.6 91.8  PLT 357 233 171    Cardiac Enzymes: Recent Labs    09/23/22 1553 09/23/22  1752 09/24/22 0359  CKTOTAL 65  --   --   TROPONINIHS 79* 85* 68*    BNP: No results for input(s): "BNP" in the last 72 hours. D-Dimer: No results for input(s): "DDIMER" in the last 72 hours. Hemoglobin A1C: Recent Labs    09/23/22 1237  HGBA1C 11.0*   Fasting Lipid Panel: No results for input(s): "CHOL", "HDL", "LDLCALC", "TRIG", "CHOLHDL", "LDLDIRECT" in the last 72 hours. Thyroid Function Tests: Recent Labs    09/25/22 0348  TSH 0.388   Anemia Panel: No results for input(s): "VITAMINB12", "FOLATE", "FERRITIN", "TIBC", "IRON", "RETICCTPCT" in the last 72 hours.   Radiology: [facility] Chest Port 1 View  Result Date: 09/24/2022 CLINICAL DATA:  Cough EXAM: PORTABLE CHEST 1 VIEW COMPARISON:  09/23/2022 x-ray FINDINGS: Underinflation with some increasing basilar bandlike changes, favor atelectasis. No separate consolidation, pneumothorax, effusion or edema. Apical pleural thickening. Postop chest with sternal wires. Borderline cardiopericardial silhouette. Calcified aorta. Overlapping cardiac leads. IMPRESSION: Decreasing inflation.  Increasing presumed basilar atelectasis. Postop chest. Electronically Signed   By: Jill Side M.D.   On: 09/24/2022 14:51   ECHOCARDIOGRAM COMPLETE  Result Date: 09/24/2022    ECHOCARDIOGRAM REPORT   Patient Name:   Ryan Kirby [place] Date of Exam: 09/24/2022 Medical Rec #:  [id number]      Height:       71.0 in Accession #:    [id number]     Weight:       180.0 lb Date of Birth:  May 01, 1941       BSA:          2.016 m Patient Age:    29 years       BP:           147/93 mmHg Patient Gender: M              HR:            78 bpm. Exam Location:  [facility] Procedure: 2D Echo, Cardiac Doppler and Color Doppler Indications:     Elevated troponin  History:         Patient has no prior history of Echocardiogram examinations.                  CAD; Risk Factors:Hypertension, Diabetes and Dyslipidemia.  Sonographer:     Sherrie Sport Referring Phys:  [place] Diagnosing Phys: Kathlyn Sacramento MD  Sonographer Comments: Technically challenging study due to limited acoustic windows. Image acquisition challenging due to uncooperative patient. IMPRESSIONS  1. Left ventricular ejection fraction, by estimation, is 55 to 60%. The left ventricle has normal function. The left ventricle has no regional wall motion abnormalities. There is mild left ventricular hypertrophy. Left ventricular diastolic parameters are consistent with Grade I diastolic dysfunction (impaired relaxation).  2. Right ventricular systolic function is normal. The right ventricular size is normal.  3. Left atrial size was mildly dilated.  4. The mitral valve is normal in structure. No evidence of mitral valve regurgitation. No evidence of mitral stenosis.  5. The aortic valve is normal in structure. Aortic valve regurgitation is mild. Aortic valve sclerosis/calcification is present, without any evidence of aortic stenosis. FINDINGS  Left Ventricle: Left ventricular ejection fraction, by estimation, is 55 to 60%. The left ventricle has normal function. The left ventricle has no regional wall motion abnormalities. The left ventricular internal cavity size was normal in size. There is  mild left ventricular hypertrophy. Left ventricular diastolic parameters are consistent with Grade I diastolic dysfunction (impaired  relaxation). Right Ventricle: The right ventricular size is normal. No increase in right ventricular wall thickness. Right ventricular systolic function is normal. Left Atrium: Left atrial size was mildly dilated. Right Atrium: Right atrial size was normal in size.  Pericardium: There is no evidence of pericardial effusion. Mitral Valve: The mitral valve is normal in structure. No evidence of mitral valve regurgitation. No evidence of mitral valve stenosis. Tricuspid Valve: The tricuspid valve is normal in structure. Tricuspid valve regurgitation is trivial. No evidence of tricuspid stenosis. Aortic Valve: The aortic valve is normal in structure. Aortic valve regurgitation is mild. Aortic valve sclerosis/calcification is present, without any evidence of aortic stenosis. Aortic valve mean gradient measures 3.0 mmHg. Aortic valve peak gradient measures 4.8 mmHg. Aortic valve area, by VTI measures 2.85 cm. Pulmonic Valve: The pulmonic valve was normal in structure. Pulmonic valve regurgitation is not visualized. No evidence of pulmonic stenosis. Aorta: The aortic root is normal in size and structure. Venous: The inferior vena cava was not well visualized. IAS/Shunts: No atrial level shunt detected by color flow Doppler.  LEFT VENTRICLE PLAX 2D LVIDd:         3.90 cm   Diastology LVIDs:         2.80 cm   LV e' medial:    3.92 cm/s LV PW:         0.90 cm   LV E/e' medial:  13.0 LV IVS:        1.50 cm   LV e' lateral:   6.31 cm/s LVOT diam:     2.00 cm   LV E/e' lateral: 8.1 LV SV:         57 LV SV Index:   28 LVOT Area:     3.14 cm  RIGHT VENTRICLE RV Basal diam:  3.70 cm RV Mid diam:    3.80 cm RV S prime:     13.20 cm/s LEFT ATRIUM           Index        RIGHT ATRIUM           Index LA diam:      4.30 cm 2.13 cm/m   RA Area:     15.50 cm LA Vol (A2C): 19.0 ml 9.42 ml/m   RA Volume:   35.70 ml  17.71 ml/m LA Vol (A4C): 35.8 ml 17.76 ml/m  AORTIC VALVE AV Area (Vmax):    2.40 cm AV Area (Vmean):   2.34 cm AV Area (VTI):     2.85 cm AV Vmax:           109.50 cm/s AV Vmean:          76.950 cm/s AV VTI:            0.200 m AV Peak Grad:      4.8 mmHg AV Mean Grad:      3.0 mmHg LVOT Vmax:         83.80 cm/s LVOT Vmean:        57.400 cm/s LVOT VTI:          0.181 m LVOT/AV VTI  ratio: 0.91  AORTA Ao Root diam: 2.60 cm MITRAL VALVE                TRICUSPID VALVE MV Area (PHT): 4.74 cm     TR Peak grad:   26.6 mmHg MV Decel Time: 160 msec     TR Vmax:        258.00 cm/s MV  E velocity: 50.80 cm/s MV A velocity: 109.00 cm/s  SHUNTS MV E/A ratio:  0.47         Systemic VTI:  0.18 m                             Systemic Diam: 2.00 cm Kathlyn Sacramento MD Electronically signed by Kathlyn Sacramento MD Signature Date/Time: 09/24/2022/11:25:06 AM    Final    MR BRAIN WO CONTRAST  Result Date: 09/23/2022 CLINICAL DATA:  Mental status change, unknown cause EXAM: MRI HEAD WITHOUT CONTRAST TECHNIQUE: Multiplanar, multiecho pulse sequences of the brain and surrounding structures were obtained without intravenous contrast. COMPARISON:  CT head from the same day. FINDINGS: Motion limited study.  Within this limitation: Brain: No acute infarction, hemorrhage, hydrocephalus, extra-axial collection or mass lesion. Cerebral atrophy. Mild for age chronic microvascular ischemic disease. Vascular: Major arterial flow voids are maintained at the skull base. Skull and upper cervical spine: Normal marrow signal. Sinuses/Orbits: Left sphenoid sinus mucosal. Otherwise, clear sinuses. No acute orbital findings. Other: No mastoid effusions. IMPRESSION: Motion limited study without evidence of acute intracranial abnormality. Electronically Signed   By: Margaretha Sheffield M.D.   On: 09/23/2022 17:26   CT Head Wo Contrast  Result Date: 09/23/2022 CLINICAL DATA:  Patient with lethargy. EXAM: CT HEAD WITHOUT CONTRAST CT CERVICAL SPINE WITHOUT CONTRAST TECHNIQUE: Multidetector CT imaging of the head and cervical spine was performed following the standard protocol without intravenous contrast. Multiplanar CT image reconstructions of the cervical spine were also generated. RADIATION DOSE REDUCTION: This exam was performed according to the departmental dose-optimization program which includes automated exposure control,  adjustment of the mA and/or kV according to patient size and/or use of iterative reconstruction technique. COMPARISON:  None Available. FINDINGS: CT HEAD FINDINGS Brain: Ventricles and sulci are prominent compatible with atrophy. Periventricular and subcortical white matter hypodensities compatible with chronic microvascular ischemic changes. No evidence for acute cortically based infarct, intracranial hemorrhage, mass lesion or mass-effect. Vascular: No hyperdense vessel or unexpected calcification. Skull: Normal. Negative for fracture or focal lesion. Sinuses/Orbits: Mucosal thickening involving the sphenoid sinus. Remainder of the paranasal sinuses well aerated. Mastoid air cells unremarkable. Other: None CT CERVICAL SPINE FINDINGS Alignment: Grade 1 anterolisthesis of C4 on C5 from facet degenerative changes at this level. Skull base and vertebrae: No acute fracture. No primary bone lesion or focal pathologic process. Soft tissues and spinal canal: Prevertebral soft tissues unremarkable. Posterior disc osteophyte complex at the C3-4 level impresses upon the ventral thecal sac. Disc levels: Multilevel degenerative disc disease throughout the visualized cervical spine. Multilevel posterior disc osteophyte complexes, most pronounced at the C3-4 level. No acute fracture. Upper chest: Unremarkable Other: Bilateral carotid arterial vascular calcifications. IMPRESSION: 1. No acute intracranial process. Atrophy and chronic microvascular ischemic changes. 2. No acute cervical spine fracture. Multilevel degenerative changes. Electronically Signed   By: Lovey Newcomer M.D.   On: 09/23/2022 13:40   CT Cervical Spine Wo Contrast  Result Date: 09/23/2022 CLINICAL DATA:  Patient with lethargy. EXAM: CT HEAD WITHOUT CONTRAST CT CERVICAL SPINE WITHOUT CONTRAST TECHNIQUE: Multidetector CT imaging of the head and cervical spine was performed following the standard protocol without intravenous contrast. Multiplanar CT image  reconstructions of the cervical spine were also generated. RADIATION DOSE REDUCTION: This exam was performed according to the departmental dose-optimization program which includes automated exposure control, adjustment of the mA and/or kV according to patient size and/or use of iterative reconstruction technique. COMPARISON:  None Available. FINDINGS: CT HEAD FINDINGS Brain: Ventricles and sulci are prominent compatible with atrophy. Periventricular and subcortical white matter hypodensities compatible with chronic microvascular ischemic changes. No evidence for acute cortically based infarct, intracranial hemorrhage, mass lesion or mass-effect. Vascular: No hyperdense vessel or unexpected calcification. Skull: Normal. Negative for fracture or focal lesion. Sinuses/Orbits: Mucosal thickening involving the sphenoid sinus. Remainder of the paranasal sinuses well aerated. Mastoid air cells unremarkable. Other: None CT CERVICAL SPINE FINDINGS Alignment: Grade 1 anterolisthesis of C4 on C5 from facet degenerative changes at this level. Skull base and vertebrae: No acute fracture. No primary bone lesion or focal pathologic process. Soft tissues and spinal canal: Prevertebral soft tissues unremarkable. Posterior disc osteophyte complex at the C3-4 level impresses upon the ventral thecal sac. Disc levels: Multilevel degenerative disc disease throughout the visualized cervical spine. Multilevel posterior disc osteophyte complexes, most pronounced at the C3-4 level. No acute fracture. Upper chest: Unremarkable Other: Bilateral carotid arterial vascular calcifications. IMPRESSION: 1. No acute intracranial process. Atrophy and chronic microvascular ischemic changes. 2. No acute cervical spine fracture. Multilevel degenerative changes. Electronically Signed   By: Lovey Newcomer M.D.   On: 09/23/2022 13:40   DG Chest Portable 1 View  Result Date: 09/23/2022 CLINICAL DATA:  Lethargic. EXAM: PORTABLE CHEST 1 VIEW COMPARISON:  None  Available. FINDINGS: Sternotomy wires are present. Lungs are adequately inflated with minimal linear density left base likely atelectasis. Cardiomediastinal silhouette is unremarkable. Degenerative change of the spine. IMPRESSION: Minimal linear density left base likely atelectasis. Electronically Signed   By: Marin Olp M.D.   On: 09/23/2022 13:03   DG Hip Unilat W OR W/O Pelvis 2-3 Views Left  Result Date: 09/21/2022 CLINICAL DATA:  Status post fall a couple of weeks ago with subsequent left hip pain. EXAM: DG HIP (WITH OR WITHOUT PELVIS) 2-3V LEFT COMPARISON:  None Available. FINDINGS: There is no evidence of an acute hip fracture or dislocation. Moderate severity degenerative changes are seen in the form of joint space narrowing and acetabular sclerosis. There is moderate severity vascular calcification. IMPRESSION: Moderate severity degenerative changes without evidence of an acute osseous abnormality. Electronically Signed   By: Virgina Norfolk M.D.   On: 09/21/2022 16:54    TELEMETRY reviewed by me (LT) 09/25/2022 : sinus rhythm rate 60s-70s  EKG reviewed by me: Sinus tachycardia rate 116 on 09/23/2022  Data reviewed by me (LT) 09/25/2022:  hospitalist progress Kirby, CBC BMP troponins vitals telemetry EKGs  Principal Problem:   Sinus bradycardia Active Problems:   CAD (coronary artery disease)   GERD (gastroesophageal reflux disease)   Hypokalemia   Diabetes mellitus with hyperglycemia (HCC)   Acute metabolic encephalopathy   Hypercalcemia   AKI (acute kidney injury) ([place])   Lactic acidosis   Hypertensive urgency    ASSESSMENT AND PLAN:  Ryan Kirby is an 14yoM with a PMH of CAD s/p CABG x 2 (LIMA - LAD, rev SVG to D1, Dr. Feliz Beam in 2000), HFpEF (EF 55-60%, G1 DD 09/2022), DM2, CKD 3, GERD, HTN who presented to [facility] ED 09/23/2022 at the request of his children for evaluation of mental status changes.  He reportedly had increased frequency of urination 1 day prior to admission,  and was difficult to arouse by his children, so EMS was called.  The patient was hypercalcemic with lactic acidosis felt to be secondary to metformin and dehydration/poor oral intake.  Cardiology is consulted on hospital day 2 for assistance with his bradycardia.  # Sinus bradycardia Seen on  telemetry 1/22, in the setting of hypokalemia, hypercalcemia which are being corrected, carvedilol 25 mg also reversed with glucagon. On 1/23 he has been in NSR with rate controlled in the 60s-70s without evidence of sinus pauses, or high-grade AV block on telemetry.   -Agree with holding carvedilol and clonidine for now -Continue to monitor and correct electrolytes -No indication for temporary transvenous pacemaker, or permanent pacemaker at this time -Continuous monitoring on telemetry while inpatient  # Acute encephalopathy # Hypercalcemia # Lactic acidosis No source of infection identified per primary team, felt to be related to poor p.o. intake, metformin use Mental status improving, agree with continuing IVF  # CAD s/p CABG x 2 (2000) # Demand ischemia No current chest pain, troponins are minimally elevated and trending 80, 75, 79, 85, 68, in the absence of chest pain or ischemic EKG changes this is most consistent with demand/supply mismatch and not ACS. Continue aspirin, pravastatin 40 mg nightly  This patient's plan of care was discussed and created with Dr. Saralyn Kirby and he is in agreement.  Signed: Tristan Schroeder , PA-C 09/25/2022, 8:02 AM [facility]

## 2022-09-26 ENCOUNTER — Other Ambulatory Visit: Payer: Self-pay

## 2022-09-26 DIAGNOSIS — E876 Hypokalemia: Secondary | ICD-10-CM | POA: Diagnosis not present

## 2022-09-26 DIAGNOSIS — R531 Weakness: Secondary | ICD-10-CM | POA: Diagnosis not present

## 2022-09-26 LAB — BASIC METABOLIC PANEL
Anion gap: 4 — ABNORMAL LOW (ref 5–15)
BUN: 23 mg/dL (ref 8–23)
CO2: 26 mmol/L (ref 22–32)
Calcium: 8.8 mg/dL — ABNORMAL LOW (ref 8.9–10.3)
Chloride: 108 mmol/L (ref 98–111)
Creatinine, Ser: 1.12 mg/dL (ref 0.61–1.24)
GFR, Estimated: >60 mL/min (ref >60)
Glucose, Bld: 139 mg/dL — ABNORMAL HIGH (ref 70–99)
Potassium: 3.2 mmol/L — ABNORMAL LOW (ref 3.5–5.1)
Sodium: 138 mmol/L (ref 135–145)

## 2022-09-26 LAB — GLUCOSE, CAPILLARY
Glucose-Capillary: 152 mg/dL — ABNORMAL HIGH (ref 70–99)
Glucose-Capillary: 155 mg/dL — ABNORMAL HIGH (ref 70–99)
Glucose-Capillary: 173 mg/dL — ABNORMAL HIGH (ref 70–99)
Glucose-Capillary: 282 mg/dL — ABNORMAL HIGH (ref 70–99)

## 2022-09-26 LAB — MISC LABCORP TEST (SEND OUT): Labcorp test code: 81950

## 2022-09-26 LAB — PHOSPHORUS: Phosphorus: 1.4 mg/dL — ABNORMAL LOW (ref 2.5–4.6)

## 2022-09-26 LAB — CBC
HCT: 36.1 % — ABNORMAL LOW (ref 39.0–52.0)
Hemoglobin: 12.7 g/dL — ABNORMAL LOW (ref 13.0–17.0)
MCH: 30.6 pg (ref 26.0–34.0)
MCHC: 35.2 g/dL (ref 30.0–36.0)
MCV: 87 fL (ref 80.0–100.0)
Platelets: 173 10*3/uL (ref 150–400)
RBC: 4.15 MIL/uL — ABNORMAL LOW (ref 4.22–5.81)
RDW: 12.9 % (ref 11.5–15.5)
WBC: 6 10*3/uL (ref 4.0–10.5)
nRBC: 0 % (ref 0.0–0.2)

## 2022-09-26 LAB — MAGNESIUM: Magnesium: 2 mg/dL (ref 1.7–2.4)

## 2022-09-26 MED ORDER — POTASSIUM CHLORIDE CRYS ER 20 MEQ PO TBCR
40.0000 meq | EXTENDED_RELEASE_TABLET | ORAL | Status: AC
  Administered 2022-09-26 (×2): 40 meq via ORAL
  Filled 2022-09-26 (×2): qty 2

## 2022-09-26 MED ORDER — DICLOFENAC SODIUM 1 % EX GEL
4.0000 g | Freq: Four times a day (QID) | CUTANEOUS | Status: DC
  Administered 2022-09-26 – 2022-09-28 (×10): 4 g via TOPICAL
  Filled 2022-09-26: qty 100

## 2022-09-26 MED ORDER — K PHOS MONO-SOD PHOS DI & MONO 155-852-130 MG PO TABS
500.0000 mg | ORAL_TABLET | Freq: Three times a day (TID) | ORAL | Status: DC
  Administered 2022-09-26 (×2): 500 mg via ORAL
  Filled 2022-09-26 (×3): qty 2

## 2022-09-26 NOTE — Evaluation (Signed)
Occupational Therapy Evaluation Patient Details Name: Ryan Kirby MRN: [id number] DOB: 1940/10/15 Today's Date: 09/26/2022   History of Present Illness Ryan Kirby is a 82 y.o. male with medical history significant for diabetes mellitus with complications of stage III chronic kidney disease, coronary artery disease, hypertension, GERD, osteoarthritis who was sent to the ER by his children for evaluation of mental status changes.  Per patient's daughter he had complained about increased frequency of urination 1 day prior to his admission and on the day of his admission his son had gone to check on him since he lives alone and was unable to wake him up and so he called EMS.  Per EMS he required sternal rub to arouse and had a blood pressure of 145/106, blood sugar 484 and heart rate 110.  Daughter states that he has had difficulty getting around due to left hip pain and usually ambulates with a cane.  He was seen by his primary care provider and had an x-ray done as an outpatient which showed severe osteoarthritis.   Clinical Impression   Patient presenting with decreased Ind in self care,balance, functional mobility/transfers, endurance, and safety awareness.  Patient reports living at home alone and being independent in self care and functional mobility with use of SPC at baseline. Patient currently functioning at min guard with use of RW for mobility and self care tasks. OT recommends use of RW at home to decrease fall risk and for energy conservation.  Patient will benefit from acute OT to increase overall independence in the areas of ADLs, functional mobility, and safety awareness in order to safely discharge home.     Recommendations for follow up therapy are one component of a multi-disciplinary discharge planning process, led by the attending physician.  Recommendations may be updated based on patient status, additional functional criteria and insurance authorization.   Follow Up  Recommendations  Home health OT     Assistance Recommended at Discharge Intermittent Supervision/Assistance  Patient can return home with the following A little help with walking and/or transfers;A little help with bathing/dressing/bathroom;Help with stairs or ramp for entrance;Assistance with cooking/housework;Assist for transportation    Functional Status Assessment  Patient has had a recent decline in their functional status and demonstrates the ability to make significant improvements in function in a reasonable and predictable amount of time.  Equipment Recommendations  Other (comment) (RW)       Precautions / Restrictions Precautions Precautions: Fall Restrictions Weight Bearing Restrictions: No      Mobility Bed Mobility               General bed mobility comments: seated in recliner chair    Transfers Overall transfer level: Needs assistance Equipment used: Rolling walker (2 wheels) Transfers: Sit to/from Stand Sit to Stand: Min guard, Min assist                  Balance Overall balance assessment: History of Falls, Needs assistance Sitting-balance support: Feet supported Sitting balance-Leahy Scale: Good     Standing balance support: Bilateral upper extremity supported, During functional activity, Reliant on assistive device for balance Standing balance-Leahy Scale: Fair                             ADL either performed or assessed with clinical judgement   ADL Overall ADL's : Needs assistance/impaired     Grooming: Oral care;Standing;Min guard  Toilet Transfer: Agricultural engineer (2 wheels)                   Vision Patient Visual Report: No change from baseline              Pertinent Vitals/Pain Pain Assessment Pain Assessment: No/denies pain     Hand Dominance Right   Extremity/Trunk Assessment Upper Extremity Assessment Upper Extremity Assessment: Overall WFL for tasks  assessed   Lower Extremity Assessment Lower Extremity Assessment: Overall WFL for tasks assessed   Cervical / Trunk Assessment Cervical / Trunk Assessment: Normal   Communication Communication Communication: No difficulties   Cognition Arousal/Alertness: Awake/alert Behavior During Therapy: WFL for tasks assessed/performed Overall Cognitive Status: Within Functional Limits for tasks assessed                                                  Home Living Family/patient expects to be discharged to:: Private residence Living Arrangements: Alone Available Help at Discharge: Family;Available PRN/intermittently Type of Home: House Home Access: Stairs to enter [employer] of Steps: 2-3 Entrance Stairs-Rails: None Home Layout: Laundry or work area in basement;One level;Able to live on main level with bedroom/bathroom     Bathroom Shower/Tub: Sponge bathes at baseline         Home Equipment: [place] - single point          Prior Functioning/Environment Prior Level of Function : Independent/Modified Independent;Driving;History of Falls (last six months)             Mobility Comments: uses walking stick for mobility. Had a recent fall, was able to get up on his own. Some bruises on knees and had cut his chin. Reports he has been driving ADLs Comments: Independent        OT Problem List: Decreased activity tolerance;Impaired balance (sitting and/or standing);Decreased safety awareness;Decreased knowledge of use of DME or AE      OT Treatment/Interventions: Self-care/ADL training;Therapeutic exercise;Therapeutic activities;DME and/or AE instruction;Patient/family education;Balance training;Energy conservation    OT Goals(Current goals can be found in the care plan section) Acute Rehab OT Goals Patient Stated Goal: to return home OT Goal Formulation: With patient/family Time For Goal Achievement: 10/10/22 Potential to Achieve Goals: Fair ADL  Goals Pt Will Perform Grooming: with modified independence;standing Pt Will Perform Lower Body Dressing: with modified independence;sit to/from stand Pt Will Transfer to Toilet: with modified independence;ambulating Pt Will Perform Toileting - Clothing Manipulation and hygiene: with modified independence;sit to/from stand  OT Frequency: Min 2X/week       AM-PAC OT "6 Clicks" Daily Activity     Outcome Measure Help from another person eating meals?: None Help from another person taking care of personal grooming?: A Little Help from another person toileting, which includes using toliet, bedpan, or urinal?: A Little Help from another person bathing (including washing, rinsing, drying)?: A Little Help from another person to put on and taking off regular upper body clothing?: None Help from another person to put on and taking off regular lower body clothing?: A Little 6 Click Score: 20   End of Session Equipment Utilized During Treatment: Rolling walker (2 wheels) Nurse Communication: Mobility status  Activity Tolerance: Patient tolerated treatment well Patient left: in chair;with call bell/phone within reach;with family/visitor present;with chair alarm set  OT Visit Diagnosis: Unsteadiness on feet (R26.81);Repeated falls (R29.6);Muscle weakness (generalized) (  M62.81)                Time: 1216-2446 OT Time Calculation (min): 16 min Charges:  OT General Charges $OT Visit: 1 Visit OT Evaluation $OT Eval Low Complexity: 1 Low OT Treatments $Self Care/Home Management : 8-22 mins Darleen Crocker, MS, OTR/L , CBIS ascom [phone]  09/26/22, 12:55 PM

## 2022-09-26 NOTE — Evaluation (Signed)
Physical Therapy Evaluation Patient Details Name: Ryan Kirby MRN: [id number] DOB: 28-May-1941 Today's Date: 09/26/2022  History of Present Illness  Ryan Kirby is a 82 y.o. male with medical history significant for diabetes mellitus with complications of stage III chronic kidney disease, coronary artery disease, hypertension, GERD, osteoarthritis who was sent to the ER by his children for evaluation of mental status changes.  Per patient's daughter he had complained about increased frequency of urination 1 day prior to his admission and on the day of his admission his son had gone to check on him since he lives alone and was unable to wake him up and so he called EMS.  Per EMS he required sternal rub to arouse and had a blood pressure of 145/106, blood sugar 484 and heart rate 110.  Daughter states that he has had difficulty getting around due to left hip pain and usually ambulates with a cane.  He was seen by his primary care provider and had an x-ray done as an outpatient which showed severe osteoarthritis.   Clinical Impression  Patient received in bed, he is agreeable to PT assessment. He is mod I with bed mobility and transfers with min guard. Patient ambulated with cga and RW ~100 feet. No lob, cues for safe use of RW. Patient reports mild left hip pain with ambulation. He will continue to benefit from skilled PT to improve functional mobility, safety and independence for return home.        Recommendations for follow up therapy are one component of a multi-disciplinary discharge planning process, led by the attending physician.  Recommendations may be updated based on patient status, additional functional criteria and insurance authorization.  Follow Up Recommendations Home health PT      Assistance Recommended at Discharge Intermittent Supervision/Assistance  Patient can return home with the following  A little help with walking and/or transfers;A little help with  bathing/dressing/bathroom;Assist for transportation;Help with stairs or ramp for entrance;Assistance with cooking/housework    Equipment Recommendations Rolling walker (2 wheels)  Recommendations for Other Services       Functional Status Assessment Patient has had a recent decline in their functional status and demonstrates the ability to make significant improvements in function in a reasonable and predictable amount of time.     Precautions / Restrictions Precautions Precautions: Fall Restrictions Weight Bearing Restrictions: No      Mobility  Bed Mobility Overal bed mobility: Modified Independent             General bed mobility comments: increased time needed, reports he sleeps in recliner a lot.    Transfers Overall transfer level: Needs assistance Equipment used: Rolling walker (2 wheels) Transfers: Sit to/from Stand Sit to Stand: From elevated surface, Min guard                Ambulation/Gait Ambulation/Gait assistance: Min guard Gait Distance (Feet): 100 Feet Assistive device: Rolling walker (2 wheels) Gait Pattern/deviations: Step-through pattern, Decreased step length - right, Decreased step length - left, Decreased stride length Gait velocity: decr     General Gait Details: cues for safe walker use. No lob. Reports mild left hip pain with gait.  Stairs            Wheelchair Mobility    Modified Rankin (Stroke Patients Only)       Balance Overall balance assessment: History of Falls, Needs assistance Sitting-balance support: Feet supported Sitting balance-Leahy Scale: Good     Standing balance support: Bilateral upper extremity  supported, During functional activity, Reliant on assistive device for balance Standing balance-Leahy Scale: Fair                               Pertinent Vitals/Pain Pain Assessment Pain Assessment: Faces Faces Pain Scale: Hurts a little bit Pain Location: L hip with ambulation-said he went  to [facility] recently to have it looked at but has n ot heard back from them Pain Descriptors / Indicators: Discomfort, Sore Pain Intervention(s): Monitored during session, Repositioned    Home Living Family/patient expects to be discharged to:: Private residence Living Arrangements: Alone Available Help at Discharge: Family;Available PRN/intermittently Type of Home: House Home Access: Stairs to enter Entrance Stairs-Rails: None Entrance Stairs-Number of Steps: 2-3   Home Layout: Laundry or work area in basement;One level;Able to live on main level with bedroom/bathroom Home Equipment: Kasandra Knudsen - single point      Prior Function Prior Level of Function : Independent/Modified Independent;Driving;History of Falls (last six months)             Mobility Comments: uses walking stick for mobility. Had a recent fall, was able to get up on his own. Some bruises on knees and had cut his chin. Reports he has been driving ADLs Comments: Independent     Hand Dominance        Extremity/Trunk Assessment   Upper Extremity Assessment Upper Extremity Assessment: Overall WFL for tasks assessed    Lower Extremity Assessment Lower Extremity Assessment: Overall WFL for tasks assessed    Cervical / Trunk Assessment Cervical / Trunk Assessment: Normal  Communication   Communication: No difficulties  Cognition Arousal/Alertness: Awake/alert Behavior During Therapy: WFL for tasks assessed/performed Overall Cognitive Status: Within Functional Limits for tasks assessed                                          General Comments      Exercises     Assessment/Plan    PT Assessment Patient needs continued PT services  PT Problem List Decreased strength;Decreased balance;Decreased mobility;Decreased activity tolerance;Decreased knowledge of use of DME;Decreased safety awareness;Pain       PT Treatment Interventions DME instruction;Therapeutic exercise;Gait training;Balance  training;Stair training;Functional mobility training;Therapeutic activities;Patient/family education    PT Goals (Current goals can be found in the Care Plan section)  Acute Rehab PT Goals Patient Stated Goal: to get better and go home PT Goal Formulation: With patient Time For Goal Achievement: 10/03/22 Potential to Achieve Goals: Good    Frequency Min 2X/week     Co-evaluation               AM-PAC PT "6 Clicks" Mobility  Outcome Measure Help needed turning from your back to your side while in a flat bed without using bedrails?: None Help needed moving from lying on your back to sitting on the side of a flat bed without using bedrails?: None Help needed moving to and from a bed to a chair (including a wheelchair)?: A Little Help needed standing up from a chair using your arms (e.g., wheelchair or bedside chair)?: A Little Help needed to walk in hospital room?: A Little Help needed climbing 3-5 steps with a railing? : A Lot 6 Click Score: 19    End of Session Equipment Utilized During Treatment: Gait belt Activity Tolerance: Patient tolerated treatment well Patient left:  in chair;with call bell/phone within reach;with chair alarm set Nurse Communication: Mobility status PT Visit Diagnosis: Other abnormalities of gait and mobility (R26.89);History of falling (Z91.81);Muscle weakness (generalized) (M62.81);Difficulty in walking, not elsewhere classified (R26.2);Pain Pain - Right/Left: Left Pain - part of body: Hip    Time: 6812-7517 PT Time Calculation (min) (ACUTE ONLY): 18 min   Charges:   PT Evaluation $PT Eval Moderate Complexity: 1 Mod PT Treatments $Gait Training: 8-22 mins        Janei Scheff, PT, GCS 09/26/22,9:46 AM

## 2022-09-26 NOTE — TOC Initial Note (Signed)
Transition of Care [facility]) - Initial/Assessment Note    Patient Details  Name: Ryan Kirby MRN: [id number] Date of Birth: 07/23/1941  [facility]) CM/SW Contact:    Tiburcio Bash, LCSW Phone Number: 09/26/2022, 1:46 PM  Clinical Narrative:                  CSW spoke with patient regarding [place] recommendations, he reports being in agreement with PT and OT. Reports needing a walker at home, will order via [employer] closer to discharge time frame.   CSW has reached out to [place] with [facility] who is able to accept patient for [facility] PT and OT  TOC will continue to follow for needs.   Expected Discharge Plan: [place] Barriers to Discharge: Continued Medical Work up   Patient Goals and CMS Choice Patient states their goals for this hospitalization and ongoing recovery are:: to go home CMS Medicare.gov Compare Post Acute Care list provided to:: Patient Choice offered to / list presented to : Patient      Expected Discharge Plan and Services       Living arrangements for the past 2 months: Single Family Home                 DME Arranged: Walker rolling DME Agency: [employer] Date DME Agency Contacted: 09/26/22     HH Arranged: PT, OT HH Agency: [facility] Date [facility] Contacted: 09/26/22 Time HH Agency Contacted: 51 Representative spoke with at [place]: [place] Arrangements/Services Living arrangements for the past 2 months: [place] with:: Self   Do you feel safe going back to the place where you live?: Yes               Activities of Daily Living Home Assistive Devices/Equipment: Eyeglasses, Environmental consultant (specify type) (front wheel) ADL Screening (condition at time of admission) Patient's cognitive ability adequate to safely complete daily activities?: No Is the patient deaf or have difficulty hearing?: No Does the patient have difficulty seeing, even when wearing glasses/contacts?: No Does the  patient have difficulty concentrating, remembering, or making decisions?: Yes Patient able to express need for assistance with ADLs?: Yes Does the patient have difficulty dressing or bathing?: Yes Independently performs ADLs?: No Communication: Independent Dressing (OT): Needs assistance Is this a change from baseline?: Pre-admission baseline Grooming: Needs assistance Is this a change from baseline?: Pre-admission baseline Feeding: Independent Bathing: Needs assistance Is this a change from baseline?: Pre-admission baseline Toileting: Needs assistance Is this a change from baseline?: Pre-admission baseline In/Out Bed: Needs assistance Is this a change from baseline?: Pre-admission baseline Walks in Home: Needs assistance Is this a change from baseline?: Pre-admission baseline Does the patient have difficulty walking or climbing stairs?: Yes Weakness of Legs: Both Weakness of Arms/Hands: None  Permission Sought/Granted                  Emotional Assessment              Admission diagnosis:  Generalized weakness [R53.1] Altered mental status, unspecified altered mental status type [L38.10] Acute metabolic encephalopathy [F75.10] Patient Active Problem List   Diagnosis Date Noted   Hypophosphatemia 09/25/2022   Generalized weakness 09/25/2022   Sinus bradycardia 25/85/2778   Acute metabolic encephalopathy 24/23/5361   Hypercalcemia 09/23/2022   AKI (acute kidney injury) ([place]) 09/23/2022   Lactic acidosis 09/23/2022   Hypertensive urgency 09/23/2022   Diabetes mellitus with hyperglycemia ([place]) 04/04/2021  Edema leg 01/31/2021   Hypokalemia 08/18/2020   PVD (peripheral vascular disease) ([place]) 08/18/2020   Status post inguinal hernia repair using synthetic patch 06/23/2020   Unilateral recurrent inguinal hernia without obstruction or gangrene 05/12/2020   Hip pain 09/22/2018   Advanced care planning/counseling discussion 12/24/2017   GERD (gastroesophageal  reflux disease) 12/06/2016   BPH (benign prostatic hyperplasia) 12/06/2016   Hyperlipidemia associated with type 2 diabetes mellitus ([place])    Hypertension    CAD (coronary artery disease)    Heart murmur 03/04/2014   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   Intermittent alternating exotropia 12/17/2012   Preglaucoma 12/17/2012   PCP:  Jon Billings, NP Pharmacy:   [facility], [place] [address] Phone: [phone] Fax: [phone]  [facility] [place], [place] [address] Phone: [phone] Fax: [phone]     Social Determinants of Health (SDOH) Social History: SDOH Screenings   Food Insecurity: No Food Insecurity (09/26/2022)  Housing: Low Risk  (09/26/2022)  Transportation Needs: No Transportation Needs (09/26/2022)  Utilities: Not At Risk (09/26/2022)  Alcohol Screen: Low Risk  (04/18/2022)  Depression (PHQ2-9): Medium Risk (09/20/2022)  Financial Resource Strain: Low Risk  (04/18/2022)  Physical Activity: Inactive (04/18/2022)  Social Connections: Unknown (04/18/2022)  Stress: No Stress Concern Present (04/18/2022)  Tobacco Use: Low Risk  (09/23/2022)   SDOH Interventions:     Readmission Risk Interventions     No data to display

## 2022-09-26 NOTE — Assessment & Plan Note (Addendum)
PT/OT evaluations - HH recommended, arranged.  Rolling walker at d/c. Fall precautions

## 2022-09-26 NOTE — Care Management Important Message (Signed)
Important Message  Patient Details  Name: Ryan Kirby MRN: [id number] Date of Birth: [birth year]   Medicare Important Message Given:  Yes     Dannette Barbara 09/26/2022, 2:59 PM

## 2022-09-26 NOTE — Progress Notes (Signed)
Progress Note   Patient: Ryan Kirby EQA:[id number] DOB: [birth year] DOA: 09/23/2022     3 DOS: the patient was seen and examined on 09/26/2022   Brief hospital course: 82 y.o. male with medical history significant for diabetes mellitus with complications of stage III chronic kidney disease, coronary artery disease, hypertension, GERD, osteoarthritis who was sent to the ER by his children for evaluation of mental status changes. Per patient's daughter he had complained about increased frequency of urination 1 day prior to his admission and on the day of his admission his son had gone to check on him since he lives alone and was unable to wake him up and so he called EMS.  Per EMS he required sternal rub to arouse and had a blood pressure of 145/106, blood sugar 484 and heart rate 110. Daughter states that he has had difficulty getting around due to left hip pain and usually ambulates with a cane.  He was seen by his primary care provider and had an x-ray done as an outpatient which showed severe osteoarthritis. Patient states that he does not feel right but has no constitutional symptoms besides constipation.  He denies having any shortness of breath, no chest pain, no headache, no fever, no chills, no cough, no abdominal pain, no leg swelling, no focal deficits or blurred vision. Abnormal labs include white count of 14, lactic acid of 3.0, potassium 2.6, serum creatinine 1.68 compared to baseline of 1.2, calcium level of 12.4, glucose of 403 with no evidence of metabolic acidosis Chest x-ray reviewed by me shows minimal linear density left base likely atelectasis.  CT scan of the brain and cervical spine showed no acute intracranial process. Atrophy and chronic microvascular ischemic changes. No acute cervical spine fracture. Multilevel degenerative changes. Patient received 1 L IV fluid bolus, IV magnesium and potassium supplements as well as a dose of cefepime, vancomycin and metronidazole  MRI of  the brain was negative.  1/22.  Patient was bradycardic.  Calcium has improved down to 10.8.  Creatinine has improved down to 1.38.  Potassium still low at 3.1.  Will continue IV fluids.  Reverse beta-blocker with calcium and glucagon.  1/23: K 2.6, phos 1.8, replacement given 1/24: K 3.2, phos 1.4 - further replacements ordered  Assessment and Plan: * Hypokalemia K 3.2, replacing with PO today Monitor BMP, Mg, phos and replace PRN  Hypophosphatemia Oral K-phos ordered for phos 1.4 today Suspect Refeeding syndrome  Hypercalcemia Likely secondary to dehydration and not eating very well also hydrochlorothiazide could be contributing.  Send off SPEP, PTH RP and vitamin D.  PTH normal range which rules out primary hyperparathyroidism.  TSH 0.388. Ca today 8.8  Sinus bradycardia Hold Coreg and clonidine.  Reversed Coreg with calcium and glucagon.  Heart rate improved from 40s yesterday into the 60s and 70s today.  AKI (acute kidney injury) ([place]) Continue IV fluid hydration continue to monitor.  Creatinine improved from 1.68 down to 1.09  Diabetes mellitus with hyperglycemia (HCC) Insulin sliding scale.  Hold metformin.  Start low-dose Semglee insulin since hemoglobin A1c 11.  Acute metabolic encephalopathy MRI of the brain negative.  Could be secondary to hypercalcemia and dehydration.  Currently no source of infection.  Lactic acidosis Likely secondary to metformin.  Improved with hydration.  CAD (coronary artery disease) Holding Coreg with bradycardia.  Generalized weakness PT/OT evaluations Fall precautions  Hypertensive urgency Hold Coreg and clonidine.  Norvasc and Cardura okay for blood pressure.  GERD (gastroesophageal reflux disease) Continue  omeprazole        Subjective: Patient up in recliner, daughter at bedside this AM.  He reports feeling better. Daughter agrees his mental status is back to his baseline.  He reports good appetite here and daughter reports  he's been eating most of his meals.  At home he had stopped eating typical meals, was eating a lot of candy. He is not able to express why he was doing this, except that it was easier for him.    Physical Exam: Vitals:   09/26/22 0346 09/26/22 0800 09/26/22 0827 09/26/22 1159  BP: (!) 166/88  (!) 156/113 126/77  Pulse: 89  88 88  Resp: '16 12 16 16  '$ Temp: 98.1 F (36.7 C)  98.6 F (37 C) 98.6 F (37 C)  TempSrc:   Oral Oral  SpO2: 99%  100% 99%  Weight:      Height:       General exam: awake, alert, no acute distress HEENT: atraumatic, clear conjunctiva, anicteric sclera, moist mucus membranes, hearing grossly normal  Respiratory system: CTAB, no wheezes, rales or rhonchi, normal respiratory effort. Cardiovascular system: normal S1/S2, RRR, no pedal edema.   Gastrointestinal system: soft, NT, ND, no HSM felt, +bowel sounds. Central nervous system: A&O x3. no gross focal neurologic deficits, normal speech Extremities: moves all, no edema, normal tone Skin: dry, intact, normal temperature Psychiatry: normal mood, congruent affect     Data Reviewed:  Notable labs --potassium 3.2, glucose 139, phosphorus 1.4 hemoglobin 12.7   Family Communication: Spoke with daughter at bedside on rounds  Disposition: Status is: Inpatient Remains inpatient appropriate because: Replacing electrolytes today IV and orally.  PT evaluation today for d/c planning.   Planned Discharge Destination: To be determined    Time spent: 35 minutes  Author: Ezekiel Slocumb, DO 09/26/2022 2:41 PM  For on call review [email or web address].

## 2022-09-27 DIAGNOSIS — E876 Hypokalemia: Secondary | ICD-10-CM | POA: Diagnosis not present

## 2022-09-27 LAB — BASIC METABOLIC PANEL
Anion gap: 5 (ref 5–15)
BUN: 17 mg/dL (ref 8–23)
CO2: 24 mmol/L (ref 22–32)
Calcium: 8.4 mg/dL — ABNORMAL LOW (ref 8.9–10.3)
Chloride: 110 mmol/L (ref 98–111)
Creatinine, Ser: 0.99 mg/dL (ref 0.61–1.24)
GFR, Estimated: >60 mL/min (ref >60)
Glucose, Bld: 116 mg/dL — ABNORMAL HIGH (ref 70–99)
Potassium: 3.3 mmol/L — ABNORMAL LOW (ref 3.5–5.1)
Sodium: 139 mmol/L (ref 135–145)

## 2022-09-27 LAB — PHOSPHORUS: Phosphorus: 1.8 mg/dL — ABNORMAL LOW (ref 2.5–4.6)

## 2022-09-27 LAB — GLUCOSE, CAPILLARY
Glucose-Capillary: 130 mg/dL — ABNORMAL HIGH (ref 70–99)
Glucose-Capillary: 279 mg/dL — ABNORMAL HIGH (ref 70–99)
Glucose-Capillary: 264 mg/dL — ABNORMAL HIGH (ref 70–99)
Glucose-Capillary: 163 mg/dL — ABNORMAL HIGH (ref 70–99)

## 2022-09-27 LAB — MAGNESIUM: Magnesium: 1.8 mg/dL (ref 1.7–2.4)

## 2022-09-27 MED ORDER — POTASSIUM CHLORIDE CRYS ER 20 MEQ PO TBCR
40.0000 meq | EXTENDED_RELEASE_TABLET | ORAL | Status: AC
  Administered 2022-09-27 (×2): 40 meq via ORAL
  Filled 2022-09-27 (×2): qty 2

## 2022-09-27 MED ORDER — POTASSIUM PHOSPHATES 15 MMOLE/5ML IV SOLN
30.0000 mmol | Freq: Once | INTRAVENOUS | Status: AC
  Administered 2022-09-27: 30 mmol via INTRAVENOUS
  Filled 2022-09-27: qty 10

## 2022-09-27 MED ORDER — LIVING WELL WITH DIABETES BOOK
Freq: Once | Status: AC
  Filled 2022-09-27: qty 1

## 2022-09-27 NOTE — Progress Notes (Signed)
Occupational Therapy Treatment Patient Details Name: Ryan Kirby MRN: [id number] DOB: 11-Mar-1941 Today's Date: 09/27/2022   History of present illness Ryan Kirby is a 82 y.o. male with medical history significant for diabetes mellitus with complications of stage III chronic kidney disease, coronary artery disease, hypertension, GERD, osteoarthritis who was sent to the ER by his children for evaluation of mental status changes.  Per patient's daughter he had complained about increased frequency of urination 1 day prior to his admission and on the day of his admission his son had gone to check on him since he lives alone and was unable to wake him up and so he called EMS.  Per EMS he required sternal rub to arouse and had a blood pressure of 145/106, blood sugar 484 and heart rate 110.  Daughter states that he has had difficulty getting around due to left hip pain and usually ambulates with a cane.  He was seen by his primary care provider and had an x-ray done as an outpatient which showed severe osteoarthritis.   OT comments  Upon entering the room, pt supine in bed and agreeable to OT intervention. Pt having just finished breakfast and agreeable to OT intervention. OT noted pt has external catheter but it is off and laying in bed. Pt having urinated on self and needing assistance. Pt using wipes to clean self and dons hospital gown with set up A. Pt stands from bed and ambulates with RW 100' into hallway with supervision and cues for head up and forward gaze. Pt making progress towards goals.   Recommendations for follow up therapy are one component of a multi-disciplinary discharge planning process, led by the attending physician.  Recommendations may be updated based on patient status, additional functional criteria and insurance authorization.    Follow Up Recommendations  Home health OT     Assistance Recommended at Discharge Intermittent Supervision/Assistance  Patient can return home  with the following  A little help with walking and/or transfers;A little help with bathing/dressing/bathroom;Help with stairs or ramp for entrance;Assistance with cooking/housework;Assist for transportation   Equipment Recommendations  Other (comment) (RW)       Precautions / Restrictions Precautions Precautions: Fall       Mobility Bed Mobility Overal bed mobility: Modified Independent                  Transfers Overall transfer level: Needs assistance Equipment used: Rolling walker (2 wheels) Transfers: Sit to/from Stand Sit to Stand: Supervision                 Balance Overall balance assessment: History of Falls, Needs assistance Sitting-balance support: Feet supported Sitting balance-Leahy Scale: Good     Standing balance support: Bilateral upper extremity supported, During functional activity, Reliant on assistive device for balance Standing balance-Leahy Scale: Fair                             ADL either performed or assessed with clinical judgement   ADL                   Upper Body Dressing : Set up;Supervision/safety Upper Body Dressing Details (indicate cue type and reason): to change hospital gown                        Extremity/Trunk Assessment Upper Extremity Assessment Upper Extremity Assessment: Overall WFL for tasks assessed   Lower  Extremity Assessment Lower Extremity Assessment: Overall WFL for tasks assessed        Vision Patient Visual Report: No change from baseline            Cognition Arousal/Alertness: Awake/alert Behavior During Therapy: WFL for tasks assessed/performed Overall Cognitive Status: Within Functional Limits for tasks assessed                                                     Pertinent Vitals/ Pain       Pain Assessment Pain Assessment: No/denies pain         Frequency  Min 2X/week        Progress Toward Goals  OT Goals(current goals can  now be found in the care plan section)  Progress towards OT goals: Progressing toward goals  Acute Rehab OT Goals Patient Stated Goal: to return home OT Goal Formulation: With patient/family Time For Goal Achievement: 10/10/22 Potential to Achieve Goals: [place] Discharge plan remains appropriate;Frequency remains appropriate       AM-PAC OT "6 Clicks" Daily Activity     Outcome Measure   Help from another person eating meals?: None Help from another person taking care of personal grooming?: None Help from another person toileting, which includes using toliet, bedpan, or urinal?: A Little Help from another person bathing (including washing, rinsing, drying)?: A Little Help from another person to put on and taking off regular upper body clothing?: None Help from another person to put on and taking off regular lower body clothing?: A Little 6 Click Score: 21    End of Session Equipment Utilized During Treatment: Rolling walker (2 wheels)  OT Visit Diagnosis: Unsteadiness on feet (R26.81);Repeated falls (R29.6);Muscle weakness (generalized) (M62.81)   Activity Tolerance Patient tolerated treatment well   Patient Left in chair;with call bell/phone within reach;with chair alarm set   Nurse Communication          Time: [phone] OT Time Calculation (min): 16 min  Charges: OT General Charges $OT Visit: 1 Visit OT Treatments $Therapeutic Activity: 8-22 mins  Darleen Crocker, MS, OTR/L , CBIS ascom [phone]  09/27/22, 2:07 PM

## 2022-09-27 NOTE — Progress Notes (Signed)
Progress Note   Patient: Ryan Kirby MPN:[id number] DOB: 15-Aug-1941 DOA: 09/23/2022     4 DOS: the patient was seen and examined on 09/27/2022   Brief hospital course: 82 y.o. male with medical history significant for diabetes mellitus with complications of stage III chronic kidney disease, coronary artery disease, hypertension, GERD, osteoarthritis who was sent to the ER by his children for evaluation of mental status changes. Per patient's daughter he had complained about increased frequency of urination 1 day prior to his admission and on the day of his admission his son had gone to check on him since he lives alone and was unable to wake him up and so he called EMS.  Per EMS he required sternal rub to arouse and had a blood pressure of 145/106, blood sugar 484 and heart rate 110. Daughter states that he has had difficulty getting around due to left hip pain and usually ambulates with a cane.  He was seen by his primary care provider and had an x-ray done as an outpatient which showed severe osteoarthritis. Patient states that he does not feel right but has no constitutional symptoms besides constipation.  He denies having any shortness of breath, no chest pain, no headache, no fever, no chills, no cough, no abdominal pain, no leg swelling, no focal deficits or blurred vision. Abnormal labs include white count of 14, lactic acid of 3.0, potassium 2.6, serum creatinine 1.68 compared to baseline of 1.2, calcium level of 12.4, glucose of 403 with no evidence of metabolic acidosis Chest x-ray reviewed by me shows minimal linear density left base likely atelectasis.  CT scan of the brain and cervical spine showed no acute intracranial process. Atrophy and chronic microvascular ischemic changes. No acute cervical spine fracture. Multilevel degenerative changes. Patient received 1 L IV fluid bolus, IV magnesium and potassium supplements as well as a dose of cefepime, vancomycin and metronidazole  MRI of  the brain was negative.  1/22.  Patient was bradycardic.  Calcium has improved down to 10.8.  Creatinine has improved down to 1.38.  Potassium still low at 3.1.  Will continue IV fluids.  Reverse beta-blocker with calcium and glucagon.  1/23: K 2.6, phos 1.8, replacement given 1/24: K 3.2, phos 1.4 - further replacements ordered  Assessment and Plan: * Hypokalemia K 3.3 from 3.2 despite replacement yesterday again. Further replacing with PO Kcl and IV K-phos today Monitor BMP, Mg, phos and replace PRN  Hypophosphatemia IV K-phos today for phos 1.8 (improved from 1.4 yesterday) Suspect Refeeding syndrome  Hypercalcemia Likely secondary to dehydration and not eating very well also hydrochlorothiazide could be contributing.  Send off SPEP, PTH RP and vitamin D.  PTH normal range which rules out primary hyperparathyroidism.  TSH 0.388. Ca today 8.4  Sinus bradycardia Hold Coreg and clonidine.  Reversed Coreg with calcium and glucagon.  Heart rate improved from 40s yesterday into the 60s and 70s today.  AKI (acute kidney injury) ([place]) Continue IV fluid hydration continue to monitor.  Creatinine improved from 1.68 down to 0.99  Diabetes mellitus with hyperglycemia (HCC) Insulin sliding scale.  Hold metformin.  Start low-dose Semglee insulin since hemoglobin A1c 11.  Acute metabolic encephalopathy MRI of the brain negative.  Could be secondary to hypercalcemia and dehydration.  Currently no source of infection.  Lactic acidosis Likely secondary to metformin.  Improved with hydration.  CAD (coronary artery disease) Holding Coreg with bradycardia.  Generalized weakness PT/OT evaluations Fall precautions  Hypertensive urgency Hold Coreg and clonidine.  Norvasc and Cardura okay for blood pressure.  GERD (gastroesophageal reflux disease) Continue omeprazole        Subjective: Patient up in recliner when seen today. Reports he feels well and getting anxious to go home.  He  denies complaints and reports eating most of his meals.     Physical Exam: Vitals:   09/27/22 0113 09/27/22 0428 09/27/22 0802 09/27/22 1153  BP: (!) 166/89 (!) 155/96 (!) 155/91 137/88  Pulse: 79 89 93 89  Resp: '17 18 16 16  '$ Temp: 98.1 F (36.7 C) 98.4 F (36.9 C) 98.1 F (36.7 C) 97.9 F (36.6 C)  TempSrc:      SpO2: 98% 98% 100% 100%  Weight:      Height:       General exam: awake, alert, no acute distress HEENT: atraumatic, clear conjunctiva, anicteric sclera, moist mucus membranes, hearing grossly normal  Respiratory system: CTAB, no wheezes, rales or rhonchi, normal respiratory effort. Cardiovascular system: normal S1/S2, RRR, no pedal edema.   Gastrointestinal system: soft, NT, ND, no HSM felt, +bowel sounds. Central nervous system: A&O x3. no gross focal neurologic deficits, normal speech Extremities: moves all, no edema, normal tone Skin: dry, intact, normal temperature Psychiatry: normal mood, congruent affect     Data Reviewed:  Notable labs --potassium 3.3 from 3.2 despite replacement yesterday.  Phos 1.8 from 1.4 needs further replacement. Glucose 116. Mg normalized 1.8.   Family Communication: Spoke with daughter at bedside on rounds 1/24.  None present today, will attempt to call  Disposition: Status is: Inpatient Remains inpatient appropriate because: Replacing electrolytes today both IV and orally.  Requires further close monitoring for stability to prevent re-admission.  Likely has refeeding syndrome.     Planned Discharge Destination: To be determined    Time spent: 35 minutes  Author: Ezekiel Slocumb, DO 09/27/2022 4:30 PM  For on call review [email or web address].

## 2022-09-27 NOTE — Inpatient Diabetes Management (Signed)
Inpatient Diabetes Program Recommendations  AACE/ADA: New Consensus Statement on Inpatient Glycemic Control (2015)  Target Ranges:  Prepandial:   less than 140 mg/dL      Peak postprandial:   less than 180 mg/dL (1-2 hours)      Critically ill patients:  140 - 180 mg/dL   Lab Results  Component Value Date   GLUCAP 279 (H) 09/27/2022   HGBA1C 11.0 (H) 09/23/2022    Review of Glycemic Control  Diabetes history: DM2 Outpatient Diabetes medications: Metformin 500 mg BID Current orders for Inpatient glycemic control: Novolog 0-15 units TID and 0-5 units QHS  Inpatient Diabetes Program Recommendations:   A1c increased from 7.6 in November to currently 11.0. Spoke with pt. Regarding increase in A1c and patient states he has been eating a lot of grapes and drinking a lot of fruit juices. Reviewed with patient juices and grapes high in natural sugars and patient willing to make changes in diet to minimize amount of juice and grapes and switch to unsweet drinks. If postprandial CBGs remain elevated, please consider: -Novolog 3 units tid meal coverage if eats 50% meal  Thank you, Bethena Roys E. Otto Caraway, RN, MSN, CDE  Diabetes Coordinator Inpatient Glycemic Control Team Team Pager [phone] (8am-5pm) 09/27/2022 1:37 PM

## 2022-09-27 NOTE — Plan of Care (Signed)

## 2022-09-28 ENCOUNTER — Encounter: Payer: Self-pay | Admitting: Internal Medicine

## 2022-09-28 DIAGNOSIS — E876 Hypokalemia: Secondary | ICD-10-CM | POA: Diagnosis not present

## 2022-09-28 LAB — BASIC METABOLIC PANEL
Anion gap: 5 (ref 5–15)
BUN: 17 mg/dL (ref 8–23)
CO2: 23 mmol/L (ref 22–32)
Calcium: 8.4 mg/dL — ABNORMAL LOW (ref 8.9–10.3)
Chloride: 110 mmol/L (ref 98–111)
Creatinine, Ser: 0.97 mg/dL (ref 0.61–1.24)
GFR, Estimated: >60 mL/min (ref >60)
Glucose, Bld: 137 mg/dL — ABNORMAL HIGH (ref 70–99)
Potassium: 3.6 mmol/L (ref 3.5–5.1)
Sodium: 138 mmol/L (ref 135–145)

## 2022-09-28 LAB — PROTEIN ELECTROPHORESIS, SERUM
A/G Ratio: 1.5 (ref 0.7–1.7)
Albumin ELP: 3.1 g/dL (ref 2.9–4.4)
Alpha-1-Globulin: 0.2 g/dL (ref 0.0–0.4)
Alpha-2-Globulin: 0.6 g/dL (ref 0.4–1.0)
Beta Globulin: 0.8 g/dL (ref 0.7–1.3)
Gamma Globulin: 0.5 g/dL (ref 0.4–1.8)
Globulin, Total: 2.1 g/dL — ABNORMAL LOW (ref 2.2–3.9)
Total Protein ELP: 5.2 g/dL — ABNORMAL LOW (ref 6.0–8.5)

## 2022-09-28 LAB — CULTURE, BLOOD (ROUTINE X 2)
Culture: NO GROWTH
Culture: NO GROWTH
Special Requests: ADEQUATE
Special Requests: ADEQUATE

## 2022-09-28 LAB — GLUCOSE, CAPILLARY
Glucose-Capillary: 146 mg/dL — ABNORMAL HIGH (ref 70–99)
Glucose-Capillary: 272 mg/dL — ABNORMAL HIGH (ref 70–99)
Glucose-Capillary: 315 mg/dL — ABNORMAL HIGH (ref 70–99)

## 2022-09-28 LAB — MAGNESIUM: Magnesium: 1.8 mg/dL (ref 1.7–2.4)

## 2022-09-28 LAB — PHOSPHORUS: Phosphorus: 1.9 mg/dL — ABNORMAL LOW (ref 2.5–4.6)

## 2022-09-28 MED ORDER — INSULIN STARTER KIT- PEN NEEDLES (ENGLISH)
1.0000 | Freq: Once | Status: AC
  Administered 2022-09-28: 1
  Filled 2022-09-28: qty 1

## 2022-09-28 MED ORDER — POTASSIUM & SODIUM PHOSPHATES 280-160-250 MG PO PACK
1.0000 | PACK | Freq: Three times a day (TID) | ORAL | Status: DC
  Administered 2022-09-28 (×2): 1 via ORAL
  Filled 2022-09-28 (×3): qty 1

## 2022-09-28 MED ORDER — FUROSEMIDE 40 MG PO TABS
40.0000 mg | ORAL_TABLET | Freq: Every day | ORAL | Status: DC
  Administered 2022-09-28: 40 mg via ORAL
  Filled 2022-09-28: qty 1

## 2022-09-28 MED ORDER — AMLODIPINE BESYLATE 10 MG PO TABS: 10.0000 mg | ORAL_TABLET | Freq: Every day | ORAL | 0 refills | Status: DC

## 2022-09-28 MED ORDER — BLOOD GLUCOSE TEST VI STRP: 1.0000 | ORAL_STRIP | Freq: Three times a day (TID) | 0 refills | Status: AC

## 2022-09-28 MED ORDER — ASPIRIN 81 MG PO TBEC: 81.0000 mg | DELAYED_RELEASE_TABLET | Freq: Every day | ORAL | 12 refills | Status: AC

## 2022-09-28 MED ORDER — PRAVASTATIN SODIUM 40 MG PO TABS: 40.0000 mg | ORAL_TABLET | Freq: Every day | ORAL | 0 refills | Status: DC

## 2022-09-28 MED ORDER — BLOOD GLUCOSE MONITORING SUPPL DEVI: 1.0000 | Freq: Three times a day (TID) | 0 refills | Status: AC

## 2022-09-28 MED ORDER — INSULIN GLARGINE 100 UNIT/ML SOLOSTAR PEN: 8.0000 [IU] | PEN_INJECTOR | Freq: Every day | SUBCUTANEOUS | 1 refills | Status: DC

## 2022-09-28 MED ORDER — PEN NEEDLES 32G X 4 MM MISC: 1.0000 | Freq: Three times a day (TID) | 1 refills | Status: DC

## 2022-09-28 MED ORDER — LANCETS MISC. MISC: 1.0000 | Freq: Three times a day (TID) | 0 refills | Status: AC

## 2022-09-28 MED ORDER — EMPAGLIFLOZIN 10 MG PO TABS: 10.0000 mg | ORAL_TABLET | Freq: Every day | ORAL | 1 refills | Status: DC

## 2022-09-28 MED ORDER — POTASSIUM PHOSPHATES 15 MMOLE/5ML IV SOLN
30.0000 mmol | Freq: Once | INTRAVENOUS | Status: DC
  Filled 2022-09-28: qty 10

## 2022-09-28 MED ORDER — LANCET DEVICE MISC: 1.0000 | Freq: Three times a day (TID) | 0 refills | Status: AC

## 2022-09-28 MED ORDER — POTASSIUM & SODIUM PHOSPHATES 280-160-250 MG PO PACK: 1.0000 | PACK | Freq: Three times a day (TID) | ORAL | 0 refills | Status: AC

## 2022-09-28 MED ORDER — POTASSIUM PHOSPHATES 15 MMOLE/5ML IV SOLN
30.0000 mmol | Freq: Once | INTRAVENOUS | Status: AC
  Administered 2022-09-28: 30 mmol via INTRAVENOUS
  Filled 2022-09-28: qty 10

## 2022-09-28 NOTE — TOC Progression Note (Signed)
Transition of Care [facility]) - Progression Note    Patient Details  Name: Ryan Kirby MRN: [id number] Date of Birth: 01-07-1941  Transition of Care [facility]) CM/SW [place], [place] Phone Number: 09/28/2022, 9:24 AM  Clinical Narrative:     RW ordered via [employer] to be delivered at bedside.   Expected Discharge Plan: [place] Barriers to Discharge: Continued Medical Work up  Expected Discharge Plan and [place] arrangements for the past 2 months: Single Family Home                 DME Arranged: Walker rolling DME Agency: [employer] Date DME Agency Contacted: 09/26/22     HH Arranged: PT, OT HH Agency: [place] Date [place] Agency Contacted: 09/26/22 Time [place]: 1975 Representative spoke with at [place]: [place] Determinants of Health ([place]) Interventions SDOH Screenings   Food Insecurity: No Food Insecurity (09/26/2022)  Housing: Low Risk  (09/26/2022)  Transportation Needs: No Transportation Needs (09/26/2022)  Utilities: Not At Risk (09/26/2022)  Alcohol Screen: Low Risk  (04/18/2022)  Depression (PHQ2-9): Medium Risk (09/20/2022)  Financial Resource Strain: Low Risk  (04/18/2022)  Physical Activity: Inactive (04/18/2022)  Social Connections: Unknown (04/18/2022)  Stress: No Stress Concern Present (04/18/2022)  Tobacco Use: Low Risk  (09/23/2022)    Readmission Risk Interventions     No data to display

## 2022-09-28 NOTE — TOC Transition Note (Signed)
Transition of Care [facility]) - CM/SW Discharge Note   Patient Details  Name: Ryan Kirby MRN: [id number] Date of Birth: 1940/11/09  Transition of Care [facility] CM/SW Contact:  Tiburcio Bash, LCSW Phone Number: 09/28/2022, 1:09 PM   Clinical Narrative:     Patient to dc home today with [facility] PT and OT, orders in and [facility] with [facility] informed. Patient has RW delivered to bedside via [employer].   No further dc needs.   Final next level of care: Home w Home Health Services Barriers to Discharge: No Barriers Identified   Patient Goals and CMS Choice CMS Medicare.gov Compare Post Acute Care list provided to:: Patient Choice offered to / list presented to : Patient  Discharge Placement                         Discharge Plan and Services Additional resources added to the After Visit Summary for                  DME Arranged: Walker rolling DME Agency: [employer] Date DME Agency Contacted: 09/26/22     HH Arranged: PT, OT HH Agency: [place] Date [facility] Agency Contacted: 09/26/22 Time [place]: [id number] Representative spoke with at [place]: [place] Determinants of Health ([place]) Interventions SDOH Screenings   Food Insecurity: No Food Insecurity (09/26/2022)  Housing: Low Risk  (09/26/2022)  Transportation Needs: No Transportation Needs (09/26/2022)  Utilities: Not At Risk (09/26/2022)  Alcohol Screen: Low Risk  (04/18/2022)  Depression (PHQ2-9): Medium Risk (09/20/2022)  Financial Resource Strain: Low Risk  (04/18/2022)  Physical Activity: Inactive (04/18/2022)  Social Connections: Unknown (04/18/2022)  Stress: No Stress Concern Present (04/18/2022)  Tobacco Use: Low Risk  (09/23/2022)     Readmission Risk Interventions     No data to display

## 2022-09-28 NOTE — Inpatient Diabetes Management (Addendum)
Inpatient Diabetes Program Recommendations  AACE/ADA: New Consensus Statement on Inpatient Glycemic Control (2015)  Target Ranges:  Prepandial:   less than 140 mg/dL      Peak postprandial:   less than 180 mg/dL (1-2 hours)      Critically ill patients:  140 - 180 mg/dL   Lab Results  Component Value Date   GLUCAP 272 (H) 09/28/2022   HGBA1C 11.0 (H) 09/23/2022    Review of Glycemic Control  Diabetes history: DM2 Outpatient Diabetes medications: Metformin 500 mg BID Current orders for Inpatient glycemic control: Semglee 5 units,  Novolog 0-15 units TID and 0-5 units QHS  Inpatient Diabetes Program Recommendations:   Met with patient @ bedside and reviewed insulin pen teaching. After multiple attempts, patient had difficulty with insulin administration. Spoke with son in law and discussed insulin injection options with him. Sent insulin pen video to daughter Juliann Pulse who lives next door to patient and to his granddaughter Erline Levine. 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 Reviewed hypoglycemia protocol with patient and also with son in law. Updated Dr. Arbutus Ped.  Thank you, Nani Gasser. Yamilex Borgwardt, RN, MSN, CDE  Diabetes Coordinator Inpatient Glycemic Control Team Team Pager [phone] (8am-5pm) 09/28/2022 2:09 PM

## 2022-09-28 NOTE — Care Management Important Message (Signed)
Important Message  Patient Details  Name: Ryan Kirby MRN: [id number] Date of Birth: Dec 16, 1940   Medicare Important Message Given:  Yes     Dannette Barbara 09/28/2022, 12:13 PM

## 2022-09-28 NOTE — Progress Notes (Signed)
Physical Therapy Treatment Patient Details Name: TRASEAN DELIMA MRN: [id number] DOB: 06-03-1941 Today's Date: 09/28/2022   History of Present Illness DENVIL CANNING is a 82 y.o. male with medical history significant for diabetes mellitus with complications of stage III chronic kidney disease, coronary artery disease, hypertension, GERD, osteoarthritis who was sent to the ER by his children for evaluation of mental status changes.  Per patient's daughter he had complained about increased frequency of urination 1 day prior to his admission and on the day of his admission his son had gone to check on him since he lives alone and was unable to wake him up and so he called EMS.  Per EMS he required sternal rub to arouse and had a blood pressure of 145/106, blood sugar 484 and heart rate 110.  Daughter states that he has had difficulty getting around due to left hip pain and usually ambulates with a cane.  He was seen by his primary care provider and had an x-ray done as an outpatient which showed severe osteoarthritis.    PT Comments    Patient received in bed, he is agreeable to PT. Pleasant, no significant complaints. He is mod I with bed mobility, transfers with supervision. Ambulated with cga/supervision 300 feet with RW. Improved safety with AD this session. No lob or fatigue, reports mild L hip pain with ambulation. He will continue to benefit from skilled PT to improve strength and safety with mobility.      Recommendations for follow up therapy are one component of a multi-disciplinary discharge planning process, led by the attending physician.  Recommendations may be updated based on patient status, additional functional criteria and insurance authorization.  Follow Up Recommendations  Home health PT     Assistance Recommended at Discharge Intermittent Supervision/Assistance  Patient can return home with the following A little help with walking and/or transfers;A little help with  bathing/dressing/bathroom;Assist for transportation;Help with stairs or ramp for entrance;Assistance with cooking/housework   Equipment Recommendations  Rolling walker (2 wheels)    Recommendations for Other Services       Precautions / Restrictions Precautions Precautions: Fall Restrictions Weight Bearing Restrictions: No     Mobility  Bed Mobility Overal bed mobility: Independent                  Transfers Overall transfer level: Needs assistance Equipment used: Rolling walker (2 wheels) Transfers: Sit to/from Stand Sit to Stand: Supervision                Ambulation/Gait Ambulation/Gait assistance: Supervision Gait Distance (Feet): 300 Feet Assistive device: Rolling walker (2 wheels) Gait Pattern/deviations: Step-through pattern, Decreased stride length Gait velocity: decr     General Gait Details: cues for safe walker use. No lob. Reports mild left hip pain with gait.   Stairs             Wheelchair Mobility    Modified Rankin (Stroke Patients Only)       Balance Overall balance assessment: History of Falls, Needs assistance Sitting-balance support: Feet supported Sitting balance-Leahy Scale: Normal     Standing balance support: Bilateral upper extremity supported, During functional activity, Reliant on assistive device for balance Standing balance-Leahy Scale: Good                              Cognition Arousal/Alertness: Awake/alert Behavior During Therapy: WFL for tasks assessed/performed Overall Cognitive Status: Within Functional Limits for tasks assessed  Exercises      General Comments        Pertinent Vitals/Pain Pain Assessment Pain Assessment: Faces Faces Pain Scale: Hurts a little bit Pain Location: L hip with ambulation-said he went to [facility] recently to have it looked at, OA Pain Descriptors / Indicators: Discomfort Pain Intervention(s):  Monitored during session    Home Living                          Prior Function            PT Goals (current goals can now be found in the care plan section) Acute Rehab PT Goals Patient Stated Goal: to get better and go home PT Goal Formulation: With patient Time For Goal Achievement: 10/03/22 Potential to Achieve Goals: Good Progress towards PT goals: Progressing toward goals    Frequency    Min 2X/week      PT Plan Current plan remains appropriate    Co-evaluation              AM-PAC PT "6 Clicks" Mobility   Outcome Measure  Help needed turning from your back to your side while in a flat bed without using bedrails?: None Help needed moving from lying on your back to sitting on the side of a flat bed without using bedrails?: None Help needed moving to and from a bed to a chair (including a wheelchair)?: A Little Help needed standing up from a chair using your arms (e.g., wheelchair or bedside chair)?: None Help needed to walk in hospital room?: A Little Help needed climbing 3-5 steps with a railing? : A Little 6 Click Score: 21    End of Session Equipment Utilized During Treatment: Gait belt Activity Tolerance: Patient tolerated treatment well Patient left: in chair;with call bell/phone within reach;with bed alarm set Nurse Communication: Mobility status PT Visit Diagnosis: Other abnormalities of gait and mobility (R26.89);History of falling (Z91.81);Muscle weakness (generalized) (M62.81);Difficulty in walking, not elsewhere classified (R26.2);Pain Pain - Right/Left: Left Pain - part of body: Hip     Time: 1308-6578 PT Time Calculation (min) (ACUTE ONLY): 20 min  Charges:  $Gait Training: 8-22 mins                     Kimm Ungaro, PT, GCS 09/28/22,10:35 AM

## 2022-09-28 NOTE — Discharge Summary (Addendum)
Physician Discharge Summary   Patient: Ryan Kirby MRN: [id number] DOB: 1941-05-03  Admit date:     09/23/2022  Discharge date: 09/28/22  Discharge Physician: Ezekiel Slocumb   PCP: Jon Billings, NP   Recommendations at discharge:    Follow up with Primary Care within 1-2 weeks Repeat BMP, Mg, Phos in 1-2 weeks Follow up on diabetes control.  A1c is 11%.  Pt declined to start insulin at discharge, so Jardiance was added in addition to continuing metformin Follow up on BP control  Discharge Diagnoses: Active Problems:   Hypophosphatemia   Diabetes mellitus with hyperglycemia (HCC)   CAD (coronary artery disease)   GERD (gastroesophageal reflux disease)   Generalized weakness  Principal Problem (Resolved):   Hypokalemia Resolved Problems:   Hypercalcemia   Sinus bradycardia   AKI (acute kidney injury) ([place])   Acute metabolic encephalopathy   Lactic acidosis   Hypertensive urgency   CKD stage IIIa  Hospital Course: 82 y.o. male with medical history significant for diabetes mellitus with complications of stage III chronic kidney disease, coronary artery disease, hypertension, GERD, osteoarthritis who was sent to the ER by his children for evaluation of mental status changes. Per patient's daughter he had complained about increased frequency of urination 1 day prior to his admission and on the day of his admission his son had gone to check on him since he lives alone and was unable to wake him up and so he called EMS.  Per EMS he required sternal rub to arouse and had a blood pressure of 145/106, blood sugar 484 and heart rate 110. Daughter states that he has had difficulty getting around due to left hip pain and usually ambulates with a cane.  He was seen by his primary care provider and had an x-ray done as an outpatient which showed severe osteoarthritis. Patient states that he does not feel right but has no constitutional symptoms besides constipation.  He denies having  any shortness of breath, no chest pain, no headache, no fever, no chills, no cough, no abdominal pain, no leg swelling, no focal deficits or blurred vision. Abnormal labs include white count of 14, lactic acid of 3.0, potassium 2.6, serum creatinine 1.68 compared to baseline of 1.2, calcium level of 12.4, glucose of 403 with no evidence of metabolic acidosis Chest x-ray reviewed by me shows minimal linear density left base likely atelectasis.  CT scan of the brain and cervical spine showed no acute intracranial process. Atrophy and chronic microvascular ischemic changes. No acute cervical spine fracture. Multilevel degenerative changes. Patient received 1 L IV fluid bolus, IV magnesium and potassium supplements as well as a dose of cefepime, vancomycin and metronidazole  MRI of the brain was negative.  1/22.  Patient was bradycardic.  Calcium has improved down to 10.8.  Creatinine has improved down to 1.38.  Potassium still low at 3.1.  Will continue IV fluids.  Reverse beta-blocker with calcium and glucagon.  1/23 >> 1/25: required further replacement of potassium and phosphorus.    1/26: additional phos replacement today and will discharge on PO phos replacement until follow up.   Patient seen by diabetes coordinator to discuss insulin given his A1c is 11%.  He and family are agreeable to insulin use at home with family assisting him.    Assessment and Plan: * Hypokalemia-resolved as of 09/28/2022 K 3.3 from 3.2 despite replacement yesterday again. Further replacing with PO Kcl and IV K-phos today Monitor BMP, Mg, phos and replace PRN  Hypophosphatemia IV K-phos today for phos 1.8 (improved from 1.4 yesterday) Suspect Refeeding syndrome  Hypercalcemia-resolved as of 09/28/2022 Likely secondary to dehydration and not eating very well also hydrochlorothiazide could be contributing.  Send off SPEP, PTH RP and vitamin D.  PTH normal range which rules out primary hyperparathyroidism.  TSH  0.388. Ca has been stable.  Sinus bradycardia-resolved as of 09/28/2022 Hold Coreg.  Reversed Coreg with calcium and glucagon.  Heart rate improved from 40s to the 60s and 70s  Stopping Coreg at d/c  Diabetes mellitus with hyperglycemia (HCC) Insulin sliding scale.  Resume metformin at discharge.  Covered with insulin since hemoglobin A1c 11%. Appreciate diabetes coordinator assistance and patient & family education.  Will discharge with Lantus 8 units at bedtime and his metformin.   Will avoid short acting insulin for now until patient and family comfortable.  Close PCP follow up.    AKI (acute kidney injury) (HCC)-resolved as of 09/28/2022 Continue IV fluid hydration continue to monitor.  Creatinine improved from 1.68 down to 5.73  Acute metabolic encephalopathy-resolved as of 09/28/2022 MRI of the brain negative.  Could be secondary to hypercalcemia and dehydration.  Currently no source of infection.  Lactic acidosis-resolved as of 09/28/2022 Likely secondary to metformin.  Improved with hydration.  CAD (coronary artery disease) Coreg had to be stopped due to  bradycardia. Stable, no chest pain.  Generalized weakness PT/OT evaluations - HH recommended, arranged.  Rolling walker at d/c. Fall precautions  GERD (gastroesophageal reflux disease) Continue omeprazole  Hypertensive urgency-resolved as of 09/28/2022 Hold Coreg and clonidine.  Norvasc and Cardura okay for blood pressure.  CKD stage IIIa - normal renal function at d/c, better than baseline. Monitor at follow up.       Consultants: None Procedures performed: None  Disposition: Home health Diet recommendation:  Cardiac and Carb modified diet DISCHARGE MEDICATION: Allergies as of 09/28/2022   No Known Allergies      Medication List     TAKE these medications    acetaminophen 500 MG tablet Commonly known as: TYLENOL Take 500-1,000 mg by mouth every 6 (six) hours as needed for mild pain or moderate pain.    amLODipine 10 MG tablet Commonly known as: NORVASC Take 1 tablet (10 mg total) by mouth daily.   aspirin EC 81 MG tablet Take 1 tablet (81 mg total) by mouth daily. Swallow whole. Start taking on: September 29, 2022   Blood Glucose Monitoring Suppl Devi 1 each by Does not apply route in the morning, at noon, and at bedtime. May substitute to any manufacturer covered by patient's insurance.   BLOOD GLUCOSE TEST STRIPS Strp 1 each by In Vitro route in the morning, at noon, and at bedtime. May substitute to any manufacturer covered by patient's insurance.   docusate sodium 100 MG capsule Commonly known as: COLACE Take 100 mg by mouth daily as needed for mild constipation.   doxazosin 2 MG tablet Commonly known as: CARDURA Take 1 tablet (2 mg total) by mouth every morning.   furosemide 40 MG tablet Commonly known as: LASIX Take 1 tablet (40 mg total) by mouth daily.   insulin glargine 100 UNIT/ML Solostar Pen Commonly known as: LANTUS Inject 8 Units into the skin daily.   Lancet Device Misc 1 each by Does not apply route in the morning, at noon, and at bedtime. May substitute to any manufacturer covered by patient's insurance.   Lancets Misc. Misc 1 each by Does not apply route in the morning, at  noon, and at bedtime. May substitute to any manufacturer covered by patient's insurance.   metFORMIN 500 MG tablet Commonly known as: GLUCOPHAGE Take 2 tablets (1,000 mg total) by mouth 2 (two) times daily.   omeprazole 20 MG tablet Commonly known as: PRILOSEC OTC Take 20 mg by mouth daily as needed (GERD symptoms).   Pen Needles 32G X 4 MM Misc Inject 1 each into the skin 4 (four) times daily -  before meals and at bedtime.   potassium & sodium phosphates 280-160-250 MG Pack Commonly known as: PHOS-NAK Take 1 packet by mouth 3 (three) times daily with meals for 14 days.   potassium chloride SA 20 MEQ tablet Commonly known as: KLOR-CON M Take 1 tablet (20 mEq total) by mouth  daily.   pravastatin 40 MG tablet Commonly known as: PRAVACHOL Take 1 tablet (40 mg total) by mouth at bedtime.               Durable Medical Equipment  (From admission, onward)           Start     Ordered   09/28/22 1036  For home use only DME Walker rolling  Once       Question Answer Comment  Walker: With [place] Wheels   Patient needs a walker to treat with the following condition Generalized weakness      09/28/22 1036   09/28/22 0923  For home use only DME Walker rolling  Once       Question Answer Comment  Walker: With [place] Wheels   Patient needs a walker to treat with the following condition Generalized weakness      09/28/22 2774            Follow-up Information     Paraschos, Alexander, MD. Go in 1 week(s).   Specialty: Cardiology Contact information: [facility] [place] [address] [phone]         Jon Billings, NP. Schedule an appointment as soon as possible for a visit in 1 week(s).   Specialty: Nurse Practitioner Why: Hospital follow up for repeat labs within 1 week Contact information: [place] [address] [phone]                Discharge Exam: Danley Danker Weights   09/23/22 1231 09/26/22 0002  Weight: 81.6 kg 78.3 kg   General exam: awake, alert, no acute distress HEENT: atraumatic, clear conjunctiva, anicteric sclera, moist mucus membranes, hearing grossly normal  Respiratory system: CTAB, no wheezes, rales or rhonchi, normal respiratory effort. Cardiovascular system: normal S1/S2,  RRR, no JVD, murmurs, rubs, gallops, no pedal edema.   Gastrointestinal system: soft, NT, ND, no HSM felt, +bowel sounds. Central nervous system: A&O x3. no gross focal neurologic deficits, normal speech Extremities: moves all, no edema, normal tone Skin: dry, intact, normal temperature, normal color, No rashes, lesions or ulcers Psychiatry: normal mood, congruent affect,  judgement and insight appear normal   Condition at discharge: stable  The results of significant diagnostics from this hospitalization (including imaging, microbiology, ancillary and laboratory) are listed below for reference.   Imaging Studies: DG Chest Port 1 View  Result Date: 09/24/2022 CLINICAL DATA:  Cough EXAM: PORTABLE CHEST 1 VIEW COMPARISON:  09/23/2022 x-ray FINDINGS: Underinflation with some increasing basilar bandlike changes, favor atelectasis. No separate consolidation, pneumothorax, effusion or edema. Apical pleural thickening. Postop chest with sternal wires. Borderline cardiopericardial silhouette. Calcified aorta. Overlapping cardiac leads. IMPRESSION: Decreasing inflation.  Increasing presumed basilar atelectasis. Postop chest. Electronically Signed   By: Jill Side M.D.   On: 09/24/2022 14:51   ECHOCARDIOGRAM COMPLETE  Result Date: 09/24/2022    ECHOCARDIOGRAM REPORT   Patient Name:   SOULEYMANE SAIKI Constantin Date of Exam: 09/24/2022 Medical Rec #:  [id number]      Height:       71.0 in Accession #:    [id number]     Weight:       180.0 lb Date of Birth:  01-11-1941       BSA:          2.016 m Patient Age:    60 years       BP:           147/93 mmHg Patient Gender: M              HR:           78 bpm. Exam Location:  [facility] Procedure: 2D Echo, Cardiac Doppler and Color Doppler Indications:     Elevated troponin  History:         Patient has no prior history of Echocardiogram examinations.                  CAD; Risk Factors:Hypertension, Diabetes and Dyslipidemia.  Sonographer:     Sherrie Sport Referring Phys:  [place] Diagnosing Phys: Kathlyn Sacramento MD  Sonographer Comments: Technically challenging study due to limited acoustic windows. Image acquisition challenging due to uncooperative patient. IMPRESSIONS  1. Left ventricular ejection fraction, by estimation, is 55 to 60%. The left ventricle has normal function. The left ventricle has no regional wall motion abnormalities. There  is mild left ventricular hypertrophy. Left ventricular diastolic parameters are consistent with Grade I diastolic dysfunction (impaired relaxation).  2. Right ventricular systolic function is normal. The right ventricular size is normal.  3. Left atrial size was mildly dilated.  4. The mitral valve is normal in structure. No evidence of mitral valve regurgitation. No evidence of mitral stenosis.  5. The aortic valve is normal in structure. Aortic valve regurgitation is mild. Aortic valve sclerosis/calcification is present, without any evidence of aortic stenosis. FINDINGS  Left Ventricle: Left ventricular ejection fraction, by estimation, is 55 to 60%. The left ventricle has normal function. The left ventricle has no regional wall motion abnormalities. The left ventricular internal cavity size was normal in size. There is  mild left ventricular hypertrophy. Left ventricular diastolic parameters are consistent with Grade I diastolic dysfunction (impaired relaxation). Right Ventricle: The right ventricular size is normal. No increase in right ventricular wall thickness. Right ventricular systolic function is normal. Left Atrium: Left atrial size was mildly dilated. Right Atrium: Right atrial size was normal in size. Pericardium: There is no evidence of pericardial effusion. Mitral Valve: The mitral valve is normal in structure. No evidence of mitral valve regurgitation. No evidence of mitral valve stenosis. Tricuspid Valve: The tricuspid valve is normal in structure. Tricuspid valve regurgitation is trivial. No evidence of tricuspid stenosis. Aortic Valve: The aortic valve is normal in structure. Aortic valve regurgitation is mild. Aortic valve sclerosis/calcification is present, without any evidence of aortic stenosis. Aortic valve mean gradient measures 3.0 mmHg. Aortic valve peak gradient measures 4.8 mmHg. Aortic valve area, by VTI measures 2.85 cm. Pulmonic Valve: The pulmonic valve was normal in structure.  Pulmonic valve regurgitation is not visualized. No evidence of pulmonic stenosis. Aorta: The aortic root is normal in size and structure. Venous: The inferior  vena cava was not well visualized. IAS/Shunts: No atrial level shunt detected by color flow Doppler.  LEFT VENTRICLE PLAX 2D LVIDd:         3.90 cm   Diastology LVIDs:         2.80 cm   LV e' medial:    3.92 cm/s LV PW:         0.90 cm   LV E/e' medial:  13.0 LV IVS:        1.50 cm   LV e' lateral:   6.31 cm/s LVOT diam:     2.00 cm   LV E/e' lateral: 8.1 LV SV:         57 LV SV Index:   28 LVOT Area:     3.14 cm  RIGHT VENTRICLE RV Basal diam:  3.70 cm RV Mid diam:    3.80 cm RV S prime:     13.20 cm/s LEFT ATRIUM           Index        RIGHT ATRIUM           Index LA diam:      4.30 cm 2.13 cm/m   RA Area:     15.50 cm LA Vol (A2C): 19.0 ml 9.42 ml/m   RA Volume:   35.70 ml  17.71 ml/m LA Vol (A4C): 35.8 ml 17.76 ml/m  AORTIC VALVE AV Area (Vmax):    2.40 cm AV Area (Vmean):   2.34 cm AV Area (VTI):     2.85 cm AV Vmax:           109.50 cm/s AV Vmean:          76.950 cm/s AV VTI:            0.200 m AV Peak Grad:      4.8 mmHg AV Mean Grad:      3.0 mmHg LVOT Vmax:         83.80 cm/s LVOT Vmean:        57.400 cm/s LVOT VTI:          0.181 m LVOT/AV VTI ratio: 0.91  AORTA Ao Root diam: 2.60 cm MITRAL VALVE                TRICUSPID VALVE MV Area (PHT): 4.74 cm     TR Peak grad:   26.6 mmHg MV Decel Time: 160 msec     TR Vmax:        258.00 cm/s MV E velocity: 50.80 cm/s MV A velocity: 109.00 cm/s  SHUNTS MV E/A ratio:  0.47         Systemic VTI:  0.18 m                             Systemic Diam: 2.00 cm Kathlyn Sacramento MD Electronically signed by Kathlyn Sacramento MD Signature Date/Time: 09/24/2022/11:25:06 AM    Final    MR BRAIN WO CONTRAST  Result Date: 09/23/2022 CLINICAL DATA:  Mental status change, unknown cause EXAM: MRI HEAD WITHOUT CONTRAST TECHNIQUE: Multiplanar, multiecho pulse sequences of the brain and surrounding structures were obtained  without intravenous contrast. COMPARISON:  CT head from the same day. FINDINGS: Motion limited study.  Within this limitation: Brain: No acute infarction, hemorrhage, hydrocephalus, extra-axial collection or mass lesion. Cerebral atrophy. Mild for age chronic microvascular ischemic disease. Vascular: Major arterial flow voids are maintained at the skull base. Skull and upper  cervical spine: Normal marrow signal. Sinuses/Orbits: Left sphenoid sinus mucosal. Otherwise, clear sinuses. No acute orbital findings. Other: No mastoid effusions. IMPRESSION: Motion limited study without evidence of acute intracranial abnormality. Electronically Signed   By: Margaretha Sheffield M.D.   On: 09/23/2022 17:26   CT Head Wo Contrast  Result Date: 09/23/2022 CLINICAL DATA:  Patient with lethargy. EXAM: CT HEAD WITHOUT CONTRAST CT CERVICAL SPINE WITHOUT CONTRAST TECHNIQUE: Multidetector CT imaging of the head and cervical spine was performed following the standard protocol without intravenous contrast. Multiplanar CT image reconstructions of the cervical spine were also generated. RADIATION DOSE REDUCTION: This exam was performed according to the departmental dose-optimization program which includes automated exposure control, adjustment of the mA and/or kV according to patient size and/or use of iterative reconstruction technique. COMPARISON:  None Available. FINDINGS: CT HEAD FINDINGS Brain: Ventricles and sulci are prominent compatible with atrophy. Periventricular and subcortical white matter hypodensities compatible with chronic microvascular ischemic changes. No evidence for acute cortically based infarct, intracranial hemorrhage, mass lesion or mass-effect. Vascular: No hyperdense vessel or unexpected calcification. Skull: Normal. Negative for fracture or focal lesion. Sinuses/Orbits: Mucosal thickening involving the sphenoid sinus. Remainder of the paranasal sinuses well aerated. Mastoid air cells unremarkable. Other: None  CT CERVICAL SPINE FINDINGS Alignment: Grade 1 anterolisthesis of C4 on C5 from facet degenerative changes at this level. Skull base and vertebrae: No acute fracture. No primary bone lesion or focal pathologic process. Soft tissues and spinal canal: Prevertebral soft tissues unremarkable. Posterior disc osteophyte complex at the C3-4 level impresses upon the ventral thecal sac. Disc levels: Multilevel degenerative disc disease throughout the visualized cervical spine. Multilevel posterior disc osteophyte complexes, most pronounced at the C3-4 level. No acute fracture. Upper chest: Unremarkable Other: Bilateral carotid arterial vascular calcifications. IMPRESSION: 1. No acute intracranial process. Atrophy and chronic microvascular ischemic changes. 2. No acute cervical spine fracture. Multilevel degenerative changes. Electronically Signed   By: Lovey Newcomer M.D.   On: 09/23/2022 13:40   CT Cervical Spine Wo Contrast  Result Date: 09/23/2022 CLINICAL DATA:  Patient with lethargy. EXAM: CT HEAD WITHOUT CONTRAST CT CERVICAL SPINE WITHOUT CONTRAST TECHNIQUE: Multidetector CT imaging of the head and cervical spine was performed following the standard protocol without intravenous contrast. Multiplanar CT image reconstructions of the cervical spine were also generated. RADIATION DOSE REDUCTION: This exam was performed according to the departmental dose-optimization program which includes automated exposure control, adjustment of the mA and/or kV according to patient size and/or use of iterative reconstruction technique. COMPARISON:  None Available. FINDINGS: CT HEAD FINDINGS Brain: Ventricles and sulci are prominent compatible with atrophy. Periventricular and subcortical white matter hypodensities compatible with chronic microvascular ischemic changes. No evidence for acute cortically based infarct, intracranial hemorrhage, mass lesion or mass-effect. Vascular: No hyperdense vessel or unexpected calcification. Skull:  Normal. Negative for fracture or focal lesion. Sinuses/Orbits: Mucosal thickening involving the sphenoid sinus. Remainder of the paranasal sinuses well aerated. Mastoid air cells unremarkable. Other: None CT CERVICAL SPINE FINDINGS Alignment: Grade 1 anterolisthesis of C4 on C5 from facet degenerative changes at this level. Skull base and vertebrae: No acute fracture. No primary bone lesion or focal pathologic process. Soft tissues and spinal canal: Prevertebral soft tissues unremarkable. Posterior disc osteophyte complex at the C3-4 level impresses upon the ventral thecal sac. Disc levels: Multilevel degenerative disc disease throughout the visualized cervical spine. Multilevel posterior disc osteophyte complexes, most pronounced at the C3-4 level. No acute fracture. Upper chest: Unremarkable Other: Bilateral carotid arterial vascular calcifications. IMPRESSION:  1. No acute intracranial process. Atrophy and chronic microvascular ischemic changes. 2. No acute cervical spine fracture. Multilevel degenerative changes. Electronically Signed   By: Lovey Newcomer M.D.   On: 09/23/2022 13:40   DG Chest Portable 1 View  Result Date: 09/23/2022 CLINICAL DATA:  Lethargic. EXAM: PORTABLE CHEST 1 VIEW COMPARISON:  None Available. FINDINGS: Sternotomy wires are present. Lungs are adequately inflated with minimal linear density left base likely atelectasis. Cardiomediastinal silhouette is unremarkable. Degenerative change of the spine. IMPRESSION: Minimal linear density left base likely atelectasis. Electronically Signed   By: Marin Olp M.D.   On: 09/23/2022 13:03   DG Hip Unilat W OR W/O Pelvis 2-3 Views Left  Result Date: 09/21/2022 CLINICAL DATA:  Status post fall a couple of weeks ago with subsequent left hip pain. EXAM: DG HIP (WITH OR WITHOUT PELVIS) 2-3V LEFT COMPARISON:  None Available. FINDINGS: There is no evidence of an acute hip fracture or dislocation. Moderate severity degenerative changes are seen in the  form of joint space narrowing and acetabular sclerosis. There is moderate severity vascular calcification. IMPRESSION: Moderate severity degenerative changes without evidence of an acute osseous abnormality. Electronically Signed   By: Virgina Norfolk M.D.   On: 09/21/2022 16:54    Microbiology: Results for orders placed or performed during the hospital encounter of 09/23/22  Culture, blood (routine x 2)     Status: None   Collection Time: 09/23/22  1:01 PM   Specimen: BLOOD  Result Value Ref Range Status   Specimen Description BLOOD BLOOD LEFT ARM  Final   Special Requests   Final    BOTTLES DRAWN AEROBIC AND ANAEROBIC Blood Culture adequate volume   Culture   Final    NO GROWTH 5 DAYS Performed at [facility], [address] [place], [place] [address]    Report Status 09/28/2022 FINAL  Final  Culture, blood (routine x 2)     Status: None   Collection Time: 09/23/22  1:01 PM   Specimen: BLOOD  Result Value Ref Range Status   Specimen Description BLOOD BLOOD RIGHT ARM  Final   Special Requests   Final    BOTTLES DRAWN AEROBIC AND ANAEROBIC Blood Culture adequate volume   Culture   Final    NO GROWTH 5 DAYS Performed at [facility], [address] [place], [place] [address]    Report Status 09/28/2022 FINAL  Final  Resp panel by RT-PCR (RSV, Flu A&B, Covid) Urine, Clean Catch     Status: None   Collection Time: 09/23/22  1:01 PM   Specimen: Urine, Clean Catch; Nasal Swab  Result Value Ref Range Status   SARS Coronavirus 2 by RT PCR NEGATIVE NEGATIVE Final    Comment: (NOTE) SARS-CoV-2 target nucleic acids are NOT DETECTED.  The SARS-CoV-2 RNA is generally detectable in upper respiratory specimens during the acute phase of infection. The lowest concentration of SARS-CoV-2 viral copies this assay can detect is 138 copies/mL. A negative result does not preclude SARS-Cov-2 infection and should not be used as the sole basis for treatment or other patient  management decisions. A negative result may occur with  improper specimen collection/handling, submission of specimen other than nasopharyngeal swab, presence of viral mutation(s) within the areas targeted by this assay, and inadequate number of viral copies(<138 copies/mL). A negative result must be combined with clinical observations, patient history, and epidemiological information. The expected result is Negative.  Fact Sheet for Patients:  EntrepreneurPulse.com.au  Fact Sheet for Healthcare Providers:  IncredibleEmployment.be  This test is no t yet approved or cleared by the [place] and  has been authorized for detection and/or diagnosis of SARS-CoV-2 by FDA under an Emergency Use Authorization (EUA). This EUA will remain  in effect (meaning this test can be used) for the duration of the COVID-19 declaration under Section 564(b)(1) of the Act, 21 U.S.C.section 360bbb-3(b)(1), unless the authorization is terminated  or revoked sooner.       Influenza A by PCR NEGATIVE NEGATIVE Final   Influenza B by PCR NEGATIVE NEGATIVE Final    Comment: (NOTE) The Xpert Xpress SARS-CoV-2/FLU/RSV plus assay is intended as an aid in the diagnosis of influenza from Nasopharyngeal swab specimens and should not be used as a sole basis for treatment. Nasal washings and aspirates are unacceptable for Xpert Xpress SARS-CoV-2/FLU/RSV testing.  Fact Sheet for Patients: EntrepreneurPulse.com.au  Fact Sheet for Healthcare Providers: IncredibleEmployment.be  This test is not yet approved or cleared by the [place] FDA and has been authorized for detection and/or diagnosis of SARS-CoV-2 by FDA under an Emergency Use Authorization (EUA). This EUA will remain in effect (meaning this test can be used) for the duration of the COVID-19 declaration under Section 564(b)(1) of the Act, 21 U.S.C. section 360bbb-3(b)(1),  unless the authorization is terminated or revoked.     Resp Syncytial Virus by PCR NEGATIVE NEGATIVE Final    Comment: (NOTE) Fact Sheet for Patients: EntrepreneurPulse.com.au  Fact Sheet for Healthcare Providers: IncredibleEmployment.be  This test is not yet approved or cleared by the [place] FDA and has been authorized for detection and/or diagnosis of SARS-CoV-2 by FDA under an Emergency Use Authorization (EUA). This EUA will remain in effect (meaning this test can be used) for the duration of the COVID-19 declaration under Section 564(b)(1) of the Act, 21 U.S.C. section 360bbb-3(b)(1), unless the authorization is terminated or revoked.  Performed at [facility], [place], [place] [address]   Respiratory (~20 pathogens) panel by PCR     Status: None   Collection Time: 09/24/22  1:01 PM   Specimen: Nasopharyngeal Swab; Respiratory  Result Value Ref Range Status   Adenovirus NOT DETECTED NOT DETECTED Final   Coronavirus 229E NOT DETECTED NOT DETECTED Final    Comment: (NOTE) The Coronavirus on the Respiratory Panel, DOES NOT test for the novel  Coronavirus (2019 nCoV)    Coronavirus HKU1 NOT DETECTED NOT DETECTED Final   Coronavirus NL63 NOT DETECTED NOT DETECTED Final   Coronavirus OC43 NOT DETECTED NOT DETECTED Final   Metapneumovirus NOT DETECTED NOT DETECTED Final   Rhinovirus / Enterovirus NOT DETECTED NOT DETECTED Final   Influenza A NOT DETECTED NOT DETECTED Final   Influenza B NOT DETECTED NOT DETECTED Final   Parainfluenza Virus 1 NOT DETECTED NOT DETECTED Final   Parainfluenza Virus 2 NOT DETECTED NOT DETECTED Final   Parainfluenza Virus 3 NOT DETECTED NOT DETECTED Final   Parainfluenza Virus 4 NOT DETECTED NOT DETECTED Final   Respiratory Syncytial Virus NOT DETECTED NOT DETECTED Final   Bordetella pertussis NOT DETECTED NOT DETECTED Final   Bordetella Parapertussis NOT DETECTED NOT DETECTED  Final   Chlamydophila pneumoniae NOT DETECTED NOT DETECTED Final   Mycoplasma pneumoniae NOT DETECTED NOT DETECTED Final    Comment: Performed at [facility], [place]. [address] [place], [place] [address]    Labs: CBC: Recent Labs  Lab 09/23/22 1237 09/24/22 0359 09/25/22 0348 09/26/22 0358  WBC 14.0* 11.2* 6.8 6.0  NEUTROABS 12.8*  --   --   --  HGB 16.2 12.8* 11.4* 12.7*  HCT 45.9 36.7* 34.5* 36.1*  MCV 86.9 87.6 91.8 87.0  PLT 357 233 171 315   Basic Metabolic Panel: Recent Labs  Lab 09/24/22 0359 09/25/22 0348 09/25/22 1654 09/26/22 0356 09/26/22 0358 09/27/22 0541 09/28/22 0546  NA 140 140  --   --  138 139 138  K 3.1* 2.6* 3.5  --  3.2* 3.3* 3.6  CL 104 107  --   --  108 110 110  CO2 29 24  --   --  '26 24 23  '$ GLUCOSE 172* 90  --   --  139* 116* 137*  BUN 36* 33*  --   --  '23 17 17  '$ CREATININE 1.38* 1.09  --   --  1.12 0.99 0.97  CALCIUM 10.8* 9.6  --   --  8.8* 8.4* 8.4*  MG 1.9 1.9  --  2.0  --  1.8 1.8  PHOS  --  1.8*  --  1.4*  --  1.8* 1.9*   Liver Function Tests: Recent Labs  Lab 09/23/22 1337  AST 36  ALT 14  ALKPHOS 74  BILITOT 1.4*  PROT 6.9  ALBUMIN 3.7   CBG: Recent Labs  Lab 09/27/22 1633 09/27/22 2116 09/28/22 0828 09/28/22 1212 09/28/22 1633  GLUCAP 264* 163* 146* 272* 315*    Discharge time spent: greater than 30 minutes.  Signed: Ezekiel Slocumb, DO [facility] 09/28/2022

## 2022-09-29 LAB — PTH-RELATED PEPTIDE: PTH-related peptide: <2 pmol/L

## 2022-09-30 DIAGNOSIS — S81802D Unspecified open wound, left lower leg, subsequent encounter: Secondary | ICD-10-CM | POA: Diagnosis not present

## 2022-09-30 DIAGNOSIS — N183 Chronic kidney disease, stage 3 unspecified: Secondary | ICD-10-CM | POA: Diagnosis not present

## 2022-09-30 DIAGNOSIS — I2510 Atherosclerotic heart disease of native coronary artery without angina pectoris: Secondary | ICD-10-CM | POA: Diagnosis not present

## 2022-09-30 DIAGNOSIS — I129 Hypertensive chronic kidney disease with stage 1 through stage 4 chronic kidney disease, or unspecified chronic kidney disease: Secondary | ICD-10-CM | POA: Diagnosis not present

## 2022-09-30 DIAGNOSIS — E785 Hyperlipidemia, unspecified: Secondary | ICD-10-CM | POA: Diagnosis not present

## 2022-09-30 DIAGNOSIS — E1122 Type 2 diabetes mellitus with diabetic chronic kidney disease: Secondary | ICD-10-CM | POA: Diagnosis not present

## 2022-09-30 DIAGNOSIS — G319 Degenerative disease of nervous system, unspecified: Secondary | ICD-10-CM | POA: Diagnosis not present

## 2022-09-30 DIAGNOSIS — M1612 Unilateral primary osteoarthritis, left hip: Secondary | ICD-10-CM | POA: Diagnosis not present

## 2022-09-30 DIAGNOSIS — M47812 Spondylosis without myelopathy or radiculopathy, cervical region: Secondary | ICD-10-CM | POA: Diagnosis not present

## 2022-10-01 ENCOUNTER — Other Ambulatory Visit: Payer: Self-pay | Admitting: Nurse Practitioner

## 2022-10-01 ENCOUNTER — Telehealth: Payer: Self-pay | Admitting: *Deleted

## 2022-10-01 DIAGNOSIS — I129 Hypertensive chronic kidney disease with stage 1 through stage 4 chronic kidney disease, or unspecified chronic kidney disease: Secondary | ICD-10-CM | POA: Diagnosis not present

## 2022-10-01 DIAGNOSIS — G319 Degenerative disease of nervous system, unspecified: Secondary | ICD-10-CM | POA: Diagnosis not present

## 2022-10-01 DIAGNOSIS — E1122 Type 2 diabetes mellitus with diabetic chronic kidney disease: Secondary | ICD-10-CM | POA: Diagnosis not present

## 2022-10-01 DIAGNOSIS — S81802D Unspecified open wound, left lower leg, subsequent encounter: Secondary | ICD-10-CM | POA: Diagnosis not present

## 2022-10-01 DIAGNOSIS — E785 Hyperlipidemia, unspecified: Secondary | ICD-10-CM | POA: Diagnosis not present

## 2022-10-01 DIAGNOSIS — M1612 Unilateral primary osteoarthritis, left hip: Secondary | ICD-10-CM | POA: Diagnosis not present

## 2022-10-01 DIAGNOSIS — N183 Chronic kidney disease, stage 3 unspecified: Secondary | ICD-10-CM | POA: Diagnosis not present

## 2022-10-01 DIAGNOSIS — M47812 Spondylosis without myelopathy or radiculopathy, cervical region: Secondary | ICD-10-CM | POA: Diagnosis not present

## 2022-10-01 DIAGNOSIS — I2510 Atherosclerotic heart disease of native coronary artery without angina pectoris: Secondary | ICD-10-CM | POA: Diagnosis not present

## 2022-10-01 MED ORDER — INSULIN PEN NEEDLE 32G X 4 MM MISC: 8.0000 [IU] | Freq: Every day | 2 refills | Status: DC

## 2022-10-01 NOTE — Patient Outreach (Signed)
  Care Coordination [facility] Note Transition Care Management Follow-up Telephone Call Date of discharge and from where: [facility] [phone] Generalized weakness  How have you been since you were released from the hospital? I am doing.  Any questions or concerns? No  Items Reviewed: Did the pt receive and understand the discharge instructions provided?  Patient wants discussed with daughter.   Medications obtained and verified?  My medications will be coming into the pharmacy today for me to pick up.  Other? No  Any new allergies since your discharge? No  Dietary orders reviewed? Yes avoid eating candy, drinking fruit juices and sodas with sugar to help control your sugars Do you have support at home? Yes   Home Care and Equipment/Supplies: Were home health services ordered? Yes PT/OT If so, what is the name of the agency? [facility]  Has the agency set up a time to come to the patient's home? no Were any new equipment or medical supplies ordered?  Yes: Rolling walker What is the name of the medical supply agency? [employer] Were you able to get the supplies/equipment? yes Do you have any questions related to the use of the equipment or supplies? No  Functional Questionnaire: (I = Independent and D = Dependent) ADLs: I  Bathing/Dressing- I  Meal Prep- I  Eating- I  Maintaining continence- I  Transferring/Ambulation- I uses a rolling walker  Managing Meds- D  Follow up appointments reviewed:  PCP Hospital f/u appt confirmed? Yes  Scheduled to see Jon Billings [phone] 9:00 . [place] [facility] f/u appt confirmed? No . Are transportation arrangements needed? No  If their condition worsens, is the pt aware to call PCP or go to the Emergency Dept.? Yes Was the patient provided with contact information for the PCP's office or ED? Yes Was to pt encouraged to call back with questions or concerns? Yes  SDOH assessments and interventions completed:   Yes SDOH Interventions Today    Flowsheet  Row Most Recent Value  SDOH Interventions   Food Insecurity Interventions Intervention Not Indicated  Housing Interventions Intervention Not Indicated  Transportation Interventions Intervention Not Indicated       Care Coordination Interventions:  Referred for Care Coordination Services:  RN Care Coordinator  [place] [phone] 3:00  Encounter Outcome:  Pt. Visit Completed    [place] Management [phone]

## 2022-10-02 DIAGNOSIS — M1612 Unilateral primary osteoarthritis, left hip: Secondary | ICD-10-CM | POA: Diagnosis not present

## 2022-10-02 DIAGNOSIS — S81802D Unspecified open wound, left lower leg, subsequent encounter: Secondary | ICD-10-CM | POA: Diagnosis not present

## 2022-10-02 DIAGNOSIS — I2510 Atherosclerotic heart disease of native coronary artery without angina pectoris: Secondary | ICD-10-CM | POA: Diagnosis not present

## 2022-10-02 DIAGNOSIS — G319 Degenerative disease of nervous system, unspecified: Secondary | ICD-10-CM | POA: Diagnosis not present

## 2022-10-02 DIAGNOSIS — I129 Hypertensive chronic kidney disease with stage 1 through stage 4 chronic kidney disease, or unspecified chronic kidney disease: Secondary | ICD-10-CM | POA: Diagnosis not present

## 2022-10-02 DIAGNOSIS — M47812 Spondylosis without myelopathy or radiculopathy, cervical region: Secondary | ICD-10-CM | POA: Diagnosis not present

## 2022-10-02 DIAGNOSIS — N183 Chronic kidney disease, stage 3 unspecified: Secondary | ICD-10-CM | POA: Diagnosis not present

## 2022-10-02 DIAGNOSIS — E785 Hyperlipidemia, unspecified: Secondary | ICD-10-CM | POA: Diagnosis not present

## 2022-10-02 DIAGNOSIS — E1122 Type 2 diabetes mellitus with diabetic chronic kidney disease: Secondary | ICD-10-CM | POA: Diagnosis not present

## 2022-10-02 NOTE — Patient Outreach (Signed)
  Care Coordination   Follow Up Visit Note   10/02/2022 Name: Ryan Kirby MRN: [id number] DOB: 06/04/1941  Ryan Kirby is a 82 y.o. year old male who sees Ryan Billings, NP for primary care. I spoke with  Ryan Kirby by phone [phone]  RN went over the appointment with daughter.  RN discussed making sure the patient has gotten  Blood glucose Monitor and medications.      Care Coordination Interventions:  No, not indicated   Follow up plan: No further intervention required.   Encounter Outcome:  Pt. Visit Completed   [place] Management [phone]

## 2022-10-04 ENCOUNTER — Telehealth: Payer: Self-pay | Admitting: Nurse Practitioner

## 2022-10-04 DIAGNOSIS — M25552 Pain in left hip: Secondary | ICD-10-CM

## 2022-10-04 MED ORDER — METHYLPREDNISOLONE 4 MG PO TBPK: ORAL_TABLET | ORAL | 0 refills | Status: DC

## 2022-10-04 NOTE — Telephone Encounter (Signed)
Patient requested something for his hip pain.

## 2022-10-04 NOTE — Telephone Encounter (Signed)
Copied from [place] [id number]. Topic: Quick Communication - Home Health Verbal Orders >> Oct 04, 2022  9:00 AM Cyndi Bender wrote: Caller/Agency: Costella Hatcher with [facility] Number: [phone] Requesting OT/PT/Skilled Nursing/Social Work/Speech Therapy: PT Frequency: 1 x 1 week, 2 x 1 week, and 1 x  2 weeks

## 2022-10-04 NOTE — Telephone Encounter (Signed)
Called and gave verbal orders per provider.

## 2022-10-04 NOTE — Telephone Encounter (Signed)
Okay for verbal orders. 

## 2022-10-05 ENCOUNTER — Telehealth: Payer: Self-pay | Admitting: Nurse Practitioner

## 2022-10-05 ENCOUNTER — Ambulatory Visit: Payer: Medicare HMO | Admitting: Nurse Practitioner

## 2022-10-05 ENCOUNTER — Other Ambulatory Visit: Payer: Self-pay | Admitting: Nurse Practitioner

## 2022-10-05 DIAGNOSIS — I129 Hypertensive chronic kidney disease with stage 1 through stage 4 chronic kidney disease, or unspecified chronic kidney disease: Secondary | ICD-10-CM | POA: Diagnosis not present

## 2022-10-05 DIAGNOSIS — I2510 Atherosclerotic heart disease of native coronary artery without angina pectoris: Secondary | ICD-10-CM | POA: Diagnosis not present

## 2022-10-05 DIAGNOSIS — I10 Essential (primary) hypertension: Secondary | ICD-10-CM

## 2022-10-05 DIAGNOSIS — E785 Hyperlipidemia, unspecified: Secondary | ICD-10-CM | POA: Diagnosis not present

## 2022-10-05 DIAGNOSIS — S81802D Unspecified open wound, left lower leg, subsequent encounter: Secondary | ICD-10-CM | POA: Diagnosis not present

## 2022-10-05 DIAGNOSIS — E1122 Type 2 diabetes mellitus with diabetic chronic kidney disease: Secondary | ICD-10-CM | POA: Diagnosis not present

## 2022-10-05 DIAGNOSIS — M47812 Spondylosis without myelopathy or radiculopathy, cervical region: Secondary | ICD-10-CM | POA: Diagnosis not present

## 2022-10-05 DIAGNOSIS — N183 Chronic kidney disease, stage 3 unspecified: Secondary | ICD-10-CM | POA: Diagnosis not present

## 2022-10-05 DIAGNOSIS — G319 Degenerative disease of nervous system, unspecified: Secondary | ICD-10-CM | POA: Diagnosis not present

## 2022-10-05 DIAGNOSIS — M1612 Unilateral primary osteoarthritis, left hip: Secondary | ICD-10-CM | POA: Diagnosis not present

## 2022-10-05 NOTE — Telephone Encounter (Signed)
Unable to refill per protocol, Rx request is too soon. Last refill 08/01/22 for 90 and 1 refill.  Requested Prescriptions  Pending Prescriptions Disp Refills   furosemide (LASIX) 40 MG tablet [Pharmacy Med Name: FUROSEMIDE 40 MG TAB] 90 tablet 1    Sig: TAKE 1 TABLET BY MOUTH ONCE DAILY     Cardiovascular:  Diuretics - Loop Failed - 10/05/2022  2:16 PM      Failed - Ca in normal range and within 180 days    Calcium  Date Value Ref Range Status  09/28/2022 8.4 (L) 8.9 - 10.3 mg/dL Final   Calcium, Ion  Date Value Ref Range Status  06/01/2020 1.31 1.15 - 1.40 mmol/L Final         Failed - Last BP in normal range    BP Readings from Last 1 Encounters:  09/28/22 (!) 140/84         Passed - K in normal range and within 180 days    Potassium  Date Value Ref Range Status  09/28/2022 3.6 3.5 - 5.1 mmol/L Final         Passed - Na in normal range and within 180 days    Sodium  Date Value Ref Range Status  09/28/2022 138 135 - 145 mmol/L Final  07/05/2022 135 134 - 144 mmol/L Final         Passed - Cr in normal range and within 180 days    Creatinine, Ser  Date Value Ref Range Status  09/28/2022 0.97 0.61 - 1.24 mg/dL Final         Passed - Cl in normal range and within 180 days    Chloride  Date Value Ref Range Status  09/28/2022 110 98 - 111 mmol/L Final         Passed - Mg Level in normal range and within 180 days    Magnesium  Date Value Ref Range Status  09/28/2022 1.8 1.7 - 2.4 mg/dL Final    Comment:    Performed at [facility], [address] [place], [place] [address]         Passed - Valid encounter within last 6 months    Recent Outpatient Visits           2 weeks ago Left hip pain   [place], Karen, NP   3 months ago Annual physical exam   [place], Karen, NP   6 months ago Hypertension associated with diabetes [facility])   [place], Karen, NP   8 months ago Hypertension associated with diabetes [facility])   [place], Karen, NP   9 months ago Hypertension associated with diabetes [facility])   [place] Jon Billings, NP       Future Appointments             In 5 days Jon Billings, NP [place]

## 2022-10-05 NOTE — Telephone Encounter (Signed)
I called and spoke to the pharmacist at [facility]  Patient picked up the Lantus prescribed by the hospital.  He should not be out of the medication.  Additionally, he has a refill on the prescription.  I tried to call patient's daughter but she didn't answer. Can you try to call her again and let her know.

## 2022-10-05 NOTE — Telephone Encounter (Signed)
Called and spoke to patients daughter. She states that she spoke with the pharmacist this morning after she called and was instructed on the patient's insulin. Patient's daughter asked why the patient's appointment was cancelled today and I explained that there was emergency maintenance that needed to be done on the building.

## 2022-10-05 NOTE — Telephone Encounter (Signed)
Copied from [place] [phone]. Topic: Appointment Scheduling - Scheduling Inquiry for Clinic >> Oct 04, 2022  5:06 PM Oley Balm E wrote: Reason for CRM: Pt's daughter called in regarding his insulin and the need to reschedule his appt for tomorrow morning because he will not have insulin to last him until next week. His appt was cancelled in error and he actually does need to be seen tomorrow for a possible work in.   Best contact: [phone]

## 2022-10-06 DIAGNOSIS — I129 Hypertensive chronic kidney disease with stage 1 through stage 4 chronic kidney disease, or unspecified chronic kidney disease: Secondary | ICD-10-CM | POA: Diagnosis not present

## 2022-10-06 DIAGNOSIS — E1122 Type 2 diabetes mellitus with diabetic chronic kidney disease: Secondary | ICD-10-CM | POA: Diagnosis not present

## 2022-10-06 DIAGNOSIS — N183 Chronic kidney disease, stage 3 unspecified: Secondary | ICD-10-CM | POA: Diagnosis not present

## 2022-10-06 DIAGNOSIS — M1612 Unilateral primary osteoarthritis, left hip: Secondary | ICD-10-CM | POA: Diagnosis not present

## 2022-10-06 DIAGNOSIS — M47812 Spondylosis without myelopathy or radiculopathy, cervical region: Secondary | ICD-10-CM | POA: Diagnosis not present

## 2022-10-06 DIAGNOSIS — I2510 Atherosclerotic heart disease of native coronary artery without angina pectoris: Secondary | ICD-10-CM | POA: Diagnosis not present

## 2022-10-06 DIAGNOSIS — G319 Degenerative disease of nervous system, unspecified: Secondary | ICD-10-CM | POA: Diagnosis not present

## 2022-10-06 DIAGNOSIS — E785 Hyperlipidemia, unspecified: Secondary | ICD-10-CM | POA: Diagnosis not present

## 2022-10-06 DIAGNOSIS — S81802D Unspecified open wound, left lower leg, subsequent encounter: Secondary | ICD-10-CM | POA: Diagnosis not present

## 2022-10-08 ENCOUNTER — Ambulatory Visit: Payer: Self-pay | Admitting: *Deleted

## 2022-10-08 NOTE — Patient Outreach (Signed)
  Care Coordination   Follow Up Visit Note   10/08/2022 Name: Ryan Kirby MRN: [id number] DOB: Sep 06, 1940  Ryan Kirby is a 82 y.o. year old male who sees Ryan Billings, NP for primary care. I spoke with daughter of Ryan Kirby by phone today.  What matters to the patients health and wellness today?  Admitted to hospital 1/21-1/26 for weakness, A1C is elevated, daughter assisting with health management.    Goals Addressed             This Visit's Progress    Develop Plan of care for Management of Diabetes. A1C 11.0   On track    Care Coordination Interventions: Provided education to patient about basic DM disease process Reviewed medications with patient and discussed importance of medication adherence Counseled on importance of regular laboratory monitoring as prescribed Discussed plans with patient for ongoing care management follow up and provided patient with direct contact information for care management team Reviewed scheduled/upcoming provider appointments including: PCP on 2/7 Advised patient, providing education and rationale, to check cbg twice a day and record, calling PCP for findings outside established parameters Screening for signs and symptoms of depression related to chronic disease state  Confirmed patient has home health for PT/OT.  Uses walker/cane for decreasing risk of falls.   Reviewed blood sugars, range 130-393.  Confirms he has restarted taking insulin.  Daughter and granddaughter are preparing pill box weekly Reviewed current A1C of 11 and a goal of less than 7         SDOH assessments and interventions completed:  No     Care Coordination Interventions:  Yes, provided   Follow up plan: Follow up call scheduled for 3/6    Encounter Outcome:  Pt. Visit Completed   Ryan David, RN, MSN, [place] Care Management Care Management Coordinator [phone]

## 2022-10-09 DIAGNOSIS — I129 Hypertensive chronic kidney disease with stage 1 through stage 4 chronic kidney disease, or unspecified chronic kidney disease: Secondary | ICD-10-CM | POA: Diagnosis not present

## 2022-10-09 DIAGNOSIS — M1612 Unilateral primary osteoarthritis, left hip: Secondary | ICD-10-CM | POA: Diagnosis not present

## 2022-10-09 DIAGNOSIS — S81802D Unspecified open wound, left lower leg, subsequent encounter: Secondary | ICD-10-CM | POA: Diagnosis not present

## 2022-10-09 DIAGNOSIS — I2510 Atherosclerotic heart disease of native coronary artery without angina pectoris: Secondary | ICD-10-CM | POA: Diagnosis not present

## 2022-10-09 DIAGNOSIS — E1122 Type 2 diabetes mellitus with diabetic chronic kidney disease: Secondary | ICD-10-CM | POA: Diagnosis not present

## 2022-10-09 DIAGNOSIS — G319 Degenerative disease of nervous system, unspecified: Secondary | ICD-10-CM | POA: Diagnosis not present

## 2022-10-09 DIAGNOSIS — N183 Chronic kidney disease, stage 3 unspecified: Secondary | ICD-10-CM | POA: Diagnosis not present

## 2022-10-09 DIAGNOSIS — E785 Hyperlipidemia, unspecified: Secondary | ICD-10-CM | POA: Diagnosis not present

## 2022-10-09 DIAGNOSIS — M47812 Spondylosis without myelopathy or radiculopathy, cervical region: Secondary | ICD-10-CM | POA: Diagnosis not present

## 2022-10-09 NOTE — Patient Instructions (Signed)
Visit Information  Thank you for taking time to visit with me today. Please don't hesitate to contact me if I can be of assistance to you before our next scheduled telephone appointment.  Following are the goals we discussed today:  Monitor blood sugars daily. Follow diabetic diet.  Our next appointment is by telephone on 3/6  Please call the care guide team at [phone] if you need to cancel or reschedule your appointment.   Please call the Suicide and Crisis Lifeline: 988 call the [place] National Suicide Prevention Lifeline: 504-735-2464 or TTY: (860) 110-3290 TTY 364-085-0338) to talk to a trained counselor call 1-800-273-TALK (toll free, 24 hour hotline) call 911 if you are experiencing a Mental Health or [place] or need someone to talk to.  Patient verbalizes understanding of instructions and care plan provided today and agrees to view in [place]. Active MyChart status and patient understanding of how to access instructions and care plan via MyChart confirmed with patient.     The patient has been provided with contact information for the care management team and has been advised to call with any health related questions or concerns.   Valente David, RN, MSN, [place] Care Management Care Management Coordinator [phone]

## 2022-10-10 ENCOUNTER — Encounter: Payer: Self-pay | Admitting: Nurse Practitioner

## 2022-10-10 ENCOUNTER — Ambulatory Visit (INDEPENDENT_AMBULATORY_CARE_PROVIDER_SITE_OTHER): Payer: Medicare HMO | Admitting: Nurse Practitioner

## 2022-10-10 VITALS — BP 123/67 | HR 120 | Temp 97.6°F | Wt 161.5 lb

## 2022-10-10 DIAGNOSIS — E1165 Type 2 diabetes mellitus with hyperglycemia: Secondary | ICD-10-CM

## 2022-10-10 DIAGNOSIS — M1612 Unilateral primary osteoarthritis, left hip: Secondary | ICD-10-CM | POA: Diagnosis not present

## 2022-10-10 DIAGNOSIS — Z09 Encounter for follow-up examination after completed treatment for conditions other than malignant neoplasm: Secondary | ICD-10-CM

## 2022-10-10 DIAGNOSIS — E1159 Type 2 diabetes mellitus with other circulatory complications: Secondary | ICD-10-CM | POA: Diagnosis not present

## 2022-10-10 DIAGNOSIS — G319 Degenerative disease of nervous system, unspecified: Secondary | ICD-10-CM | POA: Diagnosis not present

## 2022-10-10 DIAGNOSIS — I152 Hypertension secondary to endocrine disorders: Secondary | ICD-10-CM

## 2022-10-10 DIAGNOSIS — E1169 Type 2 diabetes mellitus with other specified complication: Secondary | ICD-10-CM | POA: Diagnosis not present

## 2022-10-10 DIAGNOSIS — S81802D Unspecified open wound, left lower leg, subsequent encounter: Secondary | ICD-10-CM | POA: Diagnosis not present

## 2022-10-10 DIAGNOSIS — E785 Hyperlipidemia, unspecified: Secondary | ICD-10-CM

## 2022-10-10 DIAGNOSIS — I2510 Atherosclerotic heart disease of native coronary artery without angina pectoris: Secondary | ICD-10-CM

## 2022-10-10 DIAGNOSIS — I2583 Coronary atherosclerosis due to lipid rich plaque: Secondary | ICD-10-CM

## 2022-10-10 DIAGNOSIS — I739 Peripheral vascular disease, unspecified: Secondary | ICD-10-CM

## 2022-10-10 DIAGNOSIS — M47812 Spondylosis without myelopathy or radiculopathy, cervical region: Secondary | ICD-10-CM | POA: Diagnosis not present

## 2022-10-10 DIAGNOSIS — E1122 Type 2 diabetes mellitus with diabetic chronic kidney disease: Secondary | ICD-10-CM | POA: Diagnosis not present

## 2022-10-10 DIAGNOSIS — N183 Chronic kidney disease, stage 3 unspecified: Secondary | ICD-10-CM | POA: Diagnosis not present

## 2022-10-10 DIAGNOSIS — I129 Hypertensive chronic kidney disease with stage 1 through stage 4 chronic kidney disease, or unspecified chronic kidney disease: Secondary | ICD-10-CM | POA: Diagnosis not present

## 2022-10-10 NOTE — Progress Notes (Unsigned)
BP 123/67   Pulse (!) 120   Temp 97.6 F (36.4 C) (Oral)   Wt 161 lb 8 oz (73.3 kg)   SpO2 98%   BMI 23.17 kg/m    Subjective:    Patient ID: Ryan Kirby, male    DOB: 1941-08-09, 82 y.o.   MRN: [id number]  HPI: Ryan Kirby is a 82 y.o. male  Chief Complaint  Patient presents with   Hospitalization Follow-up   Diabetes   Transition of [facility] Follow up.   Hospital/Facility: [facility] D/C Physician: Dr. Arbutus Ped D/C Date: 09/28/2022  Records Requested: NA Records Received: yes  Records Reviewed: Yes  Diagnoses on Discharge:   * Acute metabolic encephalopathy Patient's symptoms have resolved.    AKI (acute kidney injury) ([place]) Patient not currently on Benazepril or HCTZ.  Will recheck CMP at visit today.  May need to add back in if blood pressure increases.   Diabetes mellitus with hyperglycemia ([place]) Patient is receiving 8u of insulin nightly.  Family is checking blood sugars at home and they are running 170- 200 not fasting.  Patient needs help checking sugars and family is only able to help in the evenings.   Hypokalemia CMP rechecked at visit.    Lactic acidosis Resolved prior to discharge.   Hypercalcemia CMP repeated at visit today.       Date of interactive Contact within 48 hours of discharge:  Contact was through: phone  Date of 7 day or 14 day face-to-face visit:    within 7 days  Outpatient Encounter Medications as of 10/10/2022  Medication Sig   acetaminophen (TYLENOL) 500 MG tablet Take 500-1,000 mg by mouth every 6 (six) hours as needed for mild pain or moderate pain.   amLODipine (NORVASC) 10 MG tablet Take 1 tablet (10 mg total) by mouth daily.   aspirin EC 81 MG tablet Take 1 tablet (81 mg total) by mouth daily. Swallow whole.   Blood Glucose Monitoring Suppl DEVI 1 each by Does not apply route in the morning, at noon, and at bedtime. May substitute to any manufacturer covered by patient's insurance.   docusate sodium (COLACE) 100 MG  capsule Take 100 mg by mouth daily as needed for mild constipation.   doxazosin (CARDURA) 2 MG tablet Take 1 tablet (2 mg total) by mouth every morning.   furosemide (LASIX) 40 MG tablet Take 1 tablet (40 mg total) by mouth daily.   Glucose Blood (BLOOD GLUCOSE TEST STRIPS) STRP 1 each by In Vitro route in the morning, at noon, and at bedtime. May substitute to any manufacturer covered by patient's insurance.   insulin glargine (LANTUS) 100 UNIT/ML Solostar Pen Inject 8 Units into the skin daily.   Insulin Pen Needle 32G X 4 MM MISC 8 Units by Does not apply route daily.   Lancet Device MISC 1 each by Does not apply route in the morning, at noon, and at bedtime. May substitute to any manufacturer covered by patient's insurance.   Lancets Misc. MISC 1 each by Does not apply route in the morning, at noon, and at bedtime. May substitute to any manufacturer covered by patient's insurance.   metFORMIN (GLUCOPHAGE) 500 MG tablet Take 2 tablets (1,000 mg total) by mouth 2 (two) times daily.   methylPREDNISolone (MEDROL DOSEPAK) 4 MG TBPK tablet Take as directed   omeprazole (PRILOSEC OTC) 20 MG tablet Take 20 mg by mouth daily as needed (GERD symptoms).   potassium & sodium phosphates (PHOS-NAK) 280-160-250 MG PACK  Take 1 packet by mouth 3 (three) times daily with meals for 14 days.   potassium chloride SA (KLOR-CON M) 20 MEQ tablet Take 1 tablet (20 mEq total) by mouth daily.   pravastatin (PRAVACHOL) 40 MG tablet Take 1 tablet (40 mg total) by mouth at bedtime.   No facility-administered encounter medications on file as of 10/10/2022.    Diagnostic Tests Reviewed/Disposition: Reviewed  Consults: None  Discharge Instructions: Reviewed during visit  Disease/illness Education: Provided during visit  Home Health/Community Services Discussions/Referrals: Discussed during visit  Establishment or re-establishment of referral orders for community resources: Receiving home health  Discussion with other  health care providers: None  Assessment and Support of treatment regimen adherence: Provided during visit  Appointments Coordinated with: Patient's granddaughter who accompanied patient to appointment today.   Education for self-management, independent living, and ADLs: Discussed with patient and granddaughter during visit.  Relevant past medical, surgical, family and social history reviewed and updated as indicated. Interim medical history since our last visit reviewed. Allergies and medications reviewed and updated.  Review of Systems  Eyes:  Negative for visual disturbance.  Respiratory:  Negative for chest tightness and shortness of breath.   Cardiovascular:  Negative for chest pain, palpitations and leg swelling.  Endocrine: Negative for polydipsia and polyuria.  Neurological:  Negative for dizziness, light-headedness, numbness and headaches.    Per HPI unless specifically indicated above     Objective:    BP 123/67   Pulse (!) 120   Temp 97.6 F (36.4 C) (Oral)   Wt 161 lb 8 oz (73.3 kg)   SpO2 98%   BMI 23.17 kg/m   Wt Readings from Last 3 Encounters:  10/10/22 161 lb 8 oz (73.3 kg)  09/26/22 172 lb 9.9 oz (78.3 kg)  09/20/22 179 lb (81.2 kg)    Physical Exam Vitals and nursing note reviewed.  Constitutional:      General: He is not in acute distress.    Appearance: Normal appearance. He is not ill-appearing, toxic-appearing or diaphoretic.  HENT:     Head: Normocephalic.     Right Ear: External ear normal.     Left Ear: External ear normal.     Nose: Nose normal. No congestion or rhinorrhea.     Mouth/Throat:     Mouth: Mucous membranes are moist.  Eyes:     General:        Right eye: No discharge.        Left eye: No discharge.     Extraocular Movements: Extraocular movements intact.     Conjunctiva/sclera: Conjunctivae normal.     Pupils: Pupils are equal, round, and reactive to light.  Cardiovascular:     Rate and Rhythm: Normal rate and regular  rhythm.     Heart sounds: No murmur heard. Pulmonary:     Effort: Pulmonary effort is normal. No respiratory distress.     Breath sounds: Normal breath sounds. No wheezing, rhonchi or rales.  Abdominal:     General: Abdomen is flat. Bowel sounds are normal.  Musculoskeletal:     Cervical back: Normal range of motion and neck supple.  Skin:    General: Skin is warm and dry.     Capillary Refill: Capillary refill takes less than 2 seconds.  Neurological:     General: No focal deficit present.     Mental Status: He is alert and oriented to person, place, and time.  Psychiatric:        Mood and Affect:  Mood normal.        Behavior: Behavior normal.        Thought Content: Thought content normal.        Judgment: Judgment normal.     Results for orders placed or performed in visit on 10/10/22  CBC w/Diff  Result Value Ref Range   WBC 5.2 3.4 - 10.8 x10E3/uL   RBC 4.12 (L) 4.14 - 5.80 x10E6/uL   Hemoglobin 12.3 (L) 13.0 - 17.7 g/dL   Hematocrit 37.6 37.5 - 51.0 %   MCV 91 79 - 97 fL   MCH 29.9 26.6 - 33.0 pg   MCHC 32.7 31.5 - 35.7 g/dL   RDW 13.1 11.6 - 15.4 %   Platelets 188 150 - 450 x10E3/uL   Neutrophils 93 Not Estab. %   Lymphs 5 Not Estab. %   Monocytes 2 Not Estab. %   Eos 0 Not Estab. %   Basos 0 Not Estab. %   Neutrophils Absolute 4.7 1.4 - 7.0 x10E3/uL   Lymphocytes Absolute 0.3 (L) 0.7 - 3.1 x10E3/uL   Monocytes Absolute 0.1 0.1 - 0.9 x10E3/uL   EOS (ABSOLUTE) 0.0 0.0 - 0.4 x10E3/uL   Basophils Absolute 0.0 0.0 - 0.2 x10E3/uL   Immature Granulocytes 0 Not Estab. %   Immature Grans (Abs) 0.0 0.0 - 0.1 x10E3/uL  Comp Met (CMET)  Result Value Ref Range   Glucose 160 (H) 70 - 99 mg/dL   BUN 25 8 - 27 mg/dL   Creatinine, Ser 1.30 (H) 0.76 - 1.27 mg/dL   eGFR 55 (L) >59 mL/min/1.73   BUN/Creatinine Ratio 19 10 - 24   Sodium 138 134 - 144 mmol/L   Potassium 4.6 3.5 - 5.2 mmol/L   Chloride 99 96 - 106 mmol/L   CO2 22 20 - 29 mmol/L   Calcium 9.4 8.6 - 10.2  mg/dL   Total Protein 6.2 6.0 - 8.5 g/dL   Albumin 4.1 3.7 - 4.7 g/dL   Globulin, Total 2.1 1.5 - 4.5 g/dL   Albumin/Globulin Ratio 2.0 1.2 - 2.2   Bilirubin Total 0.4 0.0 - 1.2 mg/dL   Alkaline Phosphatase 63 44 - 121 IU/L   AST 17 0 - 40 IU/L   ALT 10 0 - 44 IU/L  Magnesium  Result Value Ref Range   Magnesium 1.4 (L) 1.6 - 2.3 mg/dL  Phosphorus  Result Value Ref Range   Phosphorus 3.3 2.8 - 4.1 mg/dL      Assessment & Plan:   Problem List Items Addressed This Visit       Cardiovascular and Mediastinum   Hypertension associated with diabetes (HCC)    Chronic.  Controlled.  Continue with current medication regimen of Amlodipine and Furosemide.  Benazepril and HCTZ stopped during hospitalization due to AKI.  Can resume Benazepril if blood pressure increases.  Patient on Furosemide '40mg'$  without diagnosis of CHF and euvolemic, would like to cut down to '20mg'$  daily.  Return to clinic in 3 months for reevaluation.  Labs ordered today.  Call sooner if concerns arise.       Relevant Orders   Ambulatory referral to Cardiology   CAD (coronary artery disease)    Chronic. Stable.  No chest pain at visit today.  Coreg was stopped during hospitalization.  Referral placed for patient to see Cardiology.      Relevant Orders   Ambulatory referral to Cardiology     Endocrine   Hyperlipidemia associated with type 2 diabetes mellitus ([place])  Chronic.  Controlled.  Continue with current medication regimen on Pravastatin '40mg'$  daily.        Diabetes mellitus with hyperglycemia (HCC)    Chronic.  Recent hospitalization for hyperglycemia.  A1c increased to 11%.  Using Lantus 8u daily.  Sugars are running 170-200 in the evening.  Continue with Lantus 8u.   Patient is also taking Metformin '1000mg'$  BID.  CMP drawn today.  Would like to discontinue Lantus and start GLP1 or SGLT2.  Return to clinic in 1 months for reevaluation.  Call sooner if concerns arise.       Other Visit Diagnoses      Hospital discharge follow-up    -  Primary   CMP, CBC, MG and Phos checked at visit today. Reviewed medications during visit. Family is involved heavily now.   Relevant Orders   CBC w/Diff (Completed)   Comp Met (CMET) (Completed)   Magnesium (Completed)   Phosphorus (Completed)        Follow up plan: Return in about 1 month (around 11/08/2022) for HTN, HLD, DM2 FU.

## 2022-10-11 ENCOUNTER — Encounter: Payer: Self-pay | Admitting: Nurse Practitioner

## 2022-10-11 DIAGNOSIS — N183 Chronic kidney disease, stage 3 unspecified: Secondary | ICD-10-CM | POA: Diagnosis not present

## 2022-10-11 DIAGNOSIS — I129 Hypertensive chronic kidney disease with stage 1 through stage 4 chronic kidney disease, or unspecified chronic kidney disease: Secondary | ICD-10-CM | POA: Diagnosis not present

## 2022-10-11 DIAGNOSIS — E1122 Type 2 diabetes mellitus with diabetic chronic kidney disease: Secondary | ICD-10-CM | POA: Diagnosis not present

## 2022-10-11 DIAGNOSIS — I2510 Atherosclerotic heart disease of native coronary artery without angina pectoris: Secondary | ICD-10-CM | POA: Diagnosis not present

## 2022-10-11 DIAGNOSIS — M47812 Spondylosis without myelopathy or radiculopathy, cervical region: Secondary | ICD-10-CM | POA: Diagnosis not present

## 2022-10-11 DIAGNOSIS — E785 Hyperlipidemia, unspecified: Secondary | ICD-10-CM | POA: Diagnosis not present

## 2022-10-11 DIAGNOSIS — M1612 Unilateral primary osteoarthritis, left hip: Secondary | ICD-10-CM | POA: Diagnosis not present

## 2022-10-11 DIAGNOSIS — S81802D Unspecified open wound, left lower leg, subsequent encounter: Secondary | ICD-10-CM | POA: Diagnosis not present

## 2022-10-11 DIAGNOSIS — G319 Degenerative disease of nervous system, unspecified: Secondary | ICD-10-CM | POA: Diagnosis not present

## 2022-10-11 LAB — COMPREHENSIVE METABOLIC PANEL
ALT: 10 IU/L (ref 0–44)
AST: 17 IU/L (ref 0–40)
Albumin/Globulin Ratio: 2 (ref 1.2–2.2)
Albumin: 4.1 g/dL (ref 3.7–4.7)
Alkaline Phosphatase: 63 IU/L (ref 44–121)
BUN/Creatinine Ratio: 19 (ref 10–24)
BUN: 25 mg/dL (ref 8–27)
Bilirubin Total: 0.4 mg/dL (ref 0.0–1.2)
CO2: 22 mmol/L (ref 20–29)
Calcium: 9.4 mg/dL (ref 8.6–10.2)
Chloride: 99 mmol/L (ref 96–106)
Creatinine, Ser: 1.3 mg/dL — ABNORMAL HIGH (ref 0.76–1.27)
Globulin, Total: 2.1 g/dL (ref 1.5–4.5)
Glucose: 160 mg/dL — ABNORMAL HIGH (ref 70–99)
Potassium: 4.6 mmol/L (ref 3.5–5.2)
Sodium: 138 mmol/L (ref 134–144)
Total Protein: 6.2 g/dL (ref 6.0–8.5)
eGFR: 55 mL/min/{1.73_m2} — ABNORMAL LOW (ref >59)

## 2022-10-11 LAB — MAGNESIUM: Magnesium: 1.4 mg/dL — ABNORMAL LOW (ref 1.6–2.3)

## 2022-10-11 LAB — CBC WITH DIFFERENTIAL/PLATELET
Basophils Absolute: 0 10*3/uL (ref 0.0–0.2)
Basos: 0 %
EOS (ABSOLUTE): 0 10*3/uL (ref 0.0–0.4)
Eos: 0 %
Hematocrit: 37.6 % (ref 37.5–51.0)
Hemoglobin: 12.3 g/dL — ABNORMAL LOW (ref 13.0–17.7)
Immature Grans (Abs): 0 10*3/uL (ref 0.0–0.1)
Immature Granulocytes: 0 %
Lymphocytes Absolute: 0.3 10*3/uL — ABNORMAL LOW (ref 0.7–3.1)
Lymphs: 5 %
MCH: 29.9 pg (ref 26.6–33.0)
MCHC: 32.7 g/dL (ref 31.5–35.7)
MCV: 91 fL (ref 79–97)
Monocytes Absolute: 0.1 10*3/uL (ref 0.1–0.9)
Monocytes: 2 %
Neutrophils Absolute: 4.7 10*3/uL (ref 1.4–7.0)
Neutrophils: 93 %
Platelets: 188 10*3/uL (ref 150–450)
RBC: 4.12 x10E6/uL — ABNORMAL LOW (ref 4.14–5.80)
RDW: 13.1 % (ref 11.6–15.4)
WBC: 5.2 10*3/uL (ref 3.4–10.8)

## 2022-10-11 LAB — PHOSPHORUS: Phosphorus: 3.3 mg/dL (ref 2.8–4.1)

## 2022-10-11 MED ORDER — MAG-OXIDE 200 MG PO TABS: 200.0000 mg | ORAL_TABLET | Freq: Every day | ORAL | 1 refills | Status: DC

## 2022-10-11 NOTE — Progress Notes (Signed)
Please let patient's granddaughter know that overall Ryan Kirby labs look good.  Anemia is relatively the same as prior which is good news.    Glucose was 160 which is improved from prior.   Kidney function did increase from prior.  I want him to stop the potassium supplement and decrease the Furosemide to '20mg'$ .  I can send in a new prescription instead of having to cut it in half to make it easier.    Continue with the magnesium supplement.  Phosphorus is back in normal range.    Please let me know if she has any quesitons.

## 2022-10-11 NOTE — Assessment & Plan Note (Signed)
Chronic.  Controlled.  Continue with current medication regimen on Pravastatin '40mg'$  daily.

## 2022-10-11 NOTE — Assessment & Plan Note (Signed)
Chronic.  Recent hospitalization for hyperglycemia.  A1c increased to 11%.  Using Lantus 8u daily.  Sugars are running 170-200 in the evening.  Continue with Lantus 8u.   Patient is also taking Metformin '1000mg'$  BID.  CMP drawn today.  Would like to discontinue Lantus and start GLP1 or SGLT2.  Return to clinic in 1 months for reevaluation.  Call sooner if concerns arise.

## 2022-10-11 NOTE — Assessment & Plan Note (Signed)
Chronic. Stable.  No chest pain at visit today.  Coreg was stopped during hospitalization.  Referral placed for patient to see Cardiology.

## 2022-10-11 NOTE — Assessment & Plan Note (Signed)
Chronic.  Controlled.  Continue with current medication regimen of Amlodipine and Furosemide.  Benazepril and HCTZ stopped during hospitalization due to AKI.  Can resume Benazepril if blood pressure increases.  Patient on Furosemide '40mg'$  without diagnosis of CHF and euvolemic, would like to cut down to '20mg'$  daily.  Return to clinic in 3 months for reevaluation.  Labs ordered today.  Call sooner if concerns arise.

## 2022-10-13 DIAGNOSIS — E785 Hyperlipidemia, unspecified: Secondary | ICD-10-CM | POA: Diagnosis not present

## 2022-10-13 DIAGNOSIS — N183 Chronic kidney disease, stage 3 unspecified: Secondary | ICD-10-CM | POA: Diagnosis not present

## 2022-10-13 DIAGNOSIS — E1122 Type 2 diabetes mellitus with diabetic chronic kidney disease: Secondary | ICD-10-CM | POA: Diagnosis not present

## 2022-10-13 DIAGNOSIS — I129 Hypertensive chronic kidney disease with stage 1 through stage 4 chronic kidney disease, or unspecified chronic kidney disease: Secondary | ICD-10-CM | POA: Diagnosis not present

## 2022-10-13 DIAGNOSIS — M47812 Spondylosis without myelopathy or radiculopathy, cervical region: Secondary | ICD-10-CM | POA: Diagnosis not present

## 2022-10-13 DIAGNOSIS — M1612 Unilateral primary osteoarthritis, left hip: Secondary | ICD-10-CM | POA: Diagnosis not present

## 2022-10-13 DIAGNOSIS — I2510 Atherosclerotic heart disease of native coronary artery without angina pectoris: Secondary | ICD-10-CM | POA: Diagnosis not present

## 2022-10-13 DIAGNOSIS — G319 Degenerative disease of nervous system, unspecified: Secondary | ICD-10-CM | POA: Diagnosis not present

## 2022-10-13 DIAGNOSIS — S81802D Unspecified open wound, left lower leg, subsequent encounter: Secondary | ICD-10-CM | POA: Diagnosis not present

## 2022-10-16 DIAGNOSIS — N183 Chronic kidney disease, stage 3 unspecified: Secondary | ICD-10-CM | POA: Diagnosis not present

## 2022-10-16 DIAGNOSIS — S81802D Unspecified open wound, left lower leg, subsequent encounter: Secondary | ICD-10-CM | POA: Diagnosis not present

## 2022-10-16 DIAGNOSIS — G319 Degenerative disease of nervous system, unspecified: Secondary | ICD-10-CM | POA: Diagnosis not present

## 2022-10-16 DIAGNOSIS — M1612 Unilateral primary osteoarthritis, left hip: Secondary | ICD-10-CM | POA: Diagnosis not present

## 2022-10-16 DIAGNOSIS — M47812 Spondylosis without myelopathy or radiculopathy, cervical region: Secondary | ICD-10-CM | POA: Diagnosis not present

## 2022-10-16 DIAGNOSIS — I129 Hypertensive chronic kidney disease with stage 1 through stage 4 chronic kidney disease, or unspecified chronic kidney disease: Secondary | ICD-10-CM | POA: Diagnosis not present

## 2022-10-16 DIAGNOSIS — E785 Hyperlipidemia, unspecified: Secondary | ICD-10-CM | POA: Diagnosis not present

## 2022-10-16 DIAGNOSIS — I2510 Atherosclerotic heart disease of native coronary artery without angina pectoris: Secondary | ICD-10-CM | POA: Diagnosis not present

## 2022-10-16 DIAGNOSIS — E1122 Type 2 diabetes mellitus with diabetic chronic kidney disease: Secondary | ICD-10-CM | POA: Diagnosis not present

## 2022-10-19 DIAGNOSIS — E1122 Type 2 diabetes mellitus with diabetic chronic kidney disease: Secondary | ICD-10-CM | POA: Diagnosis not present

## 2022-10-19 DIAGNOSIS — S81802D Unspecified open wound, left lower leg, subsequent encounter: Secondary | ICD-10-CM | POA: Diagnosis not present

## 2022-10-19 DIAGNOSIS — E785 Hyperlipidemia, unspecified: Secondary | ICD-10-CM | POA: Diagnosis not present

## 2022-10-19 DIAGNOSIS — N183 Chronic kidney disease, stage 3 unspecified: Secondary | ICD-10-CM | POA: Diagnosis not present

## 2022-10-19 DIAGNOSIS — I2510 Atherosclerotic heart disease of native coronary artery without angina pectoris: Secondary | ICD-10-CM | POA: Diagnosis not present

## 2022-10-19 DIAGNOSIS — I129 Hypertensive chronic kidney disease with stage 1 through stage 4 chronic kidney disease, or unspecified chronic kidney disease: Secondary | ICD-10-CM | POA: Diagnosis not present

## 2022-10-19 DIAGNOSIS — M47812 Spondylosis without myelopathy or radiculopathy, cervical region: Secondary | ICD-10-CM | POA: Diagnosis not present

## 2022-10-19 DIAGNOSIS — M1612 Unilateral primary osteoarthritis, left hip: Secondary | ICD-10-CM | POA: Diagnosis not present

## 2022-10-19 DIAGNOSIS — G319 Degenerative disease of nervous system, unspecified: Secondary | ICD-10-CM | POA: Diagnosis not present

## 2022-10-20 DIAGNOSIS — I2510 Atherosclerotic heart disease of native coronary artery without angina pectoris: Secondary | ICD-10-CM | POA: Diagnosis not present

## 2022-10-20 DIAGNOSIS — E1122 Type 2 diabetes mellitus with diabetic chronic kidney disease: Secondary | ICD-10-CM | POA: Diagnosis not present

## 2022-10-20 DIAGNOSIS — M1612 Unilateral primary osteoarthritis, left hip: Secondary | ICD-10-CM | POA: Diagnosis not present

## 2022-10-20 DIAGNOSIS — I129 Hypertensive chronic kidney disease with stage 1 through stage 4 chronic kidney disease, or unspecified chronic kidney disease: Secondary | ICD-10-CM | POA: Diagnosis not present

## 2022-10-20 DIAGNOSIS — E785 Hyperlipidemia, unspecified: Secondary | ICD-10-CM | POA: Diagnosis not present

## 2022-10-20 DIAGNOSIS — M47812 Spondylosis without myelopathy or radiculopathy, cervical region: Secondary | ICD-10-CM | POA: Diagnosis not present

## 2022-10-20 DIAGNOSIS — G319 Degenerative disease of nervous system, unspecified: Secondary | ICD-10-CM | POA: Diagnosis not present

## 2022-10-20 DIAGNOSIS — S81802D Unspecified open wound, left lower leg, subsequent encounter: Secondary | ICD-10-CM | POA: Diagnosis not present

## 2022-10-20 DIAGNOSIS — N183 Chronic kidney disease, stage 3 unspecified: Secondary | ICD-10-CM | POA: Diagnosis not present

## 2022-10-22 DIAGNOSIS — M1612 Unilateral primary osteoarthritis, left hip: Secondary | ICD-10-CM | POA: Diagnosis not present

## 2022-10-22 DIAGNOSIS — E1122 Type 2 diabetes mellitus with diabetic chronic kidney disease: Secondary | ICD-10-CM | POA: Diagnosis not present

## 2022-10-22 DIAGNOSIS — I2510 Atherosclerotic heart disease of native coronary artery without angina pectoris: Secondary | ICD-10-CM | POA: Diagnosis not present

## 2022-10-22 DIAGNOSIS — S81802D Unspecified open wound, left lower leg, subsequent encounter: Secondary | ICD-10-CM | POA: Diagnosis not present

## 2022-10-22 DIAGNOSIS — I129 Hypertensive chronic kidney disease with stage 1 through stage 4 chronic kidney disease, or unspecified chronic kidney disease: Secondary | ICD-10-CM | POA: Diagnosis not present

## 2022-10-22 DIAGNOSIS — G319 Degenerative disease of nervous system, unspecified: Secondary | ICD-10-CM | POA: Diagnosis not present

## 2022-10-22 DIAGNOSIS — N183 Chronic kidney disease, stage 3 unspecified: Secondary | ICD-10-CM | POA: Diagnosis not present

## 2022-10-22 DIAGNOSIS — E785 Hyperlipidemia, unspecified: Secondary | ICD-10-CM | POA: Diagnosis not present

## 2022-10-22 DIAGNOSIS — M47812 Spondylosis without myelopathy or radiculopathy, cervical region: Secondary | ICD-10-CM | POA: Diagnosis not present

## 2022-10-25 DIAGNOSIS — E785 Hyperlipidemia, unspecified: Secondary | ICD-10-CM | POA: Diagnosis not present

## 2022-10-25 DIAGNOSIS — N183 Chronic kidney disease, stage 3 unspecified: Secondary | ICD-10-CM | POA: Diagnosis not present

## 2022-10-25 DIAGNOSIS — M1612 Unilateral primary osteoarthritis, left hip: Secondary | ICD-10-CM | POA: Diagnosis not present

## 2022-10-25 DIAGNOSIS — I2510 Atherosclerotic heart disease of native coronary artery without angina pectoris: Secondary | ICD-10-CM | POA: Diagnosis not present

## 2022-10-25 DIAGNOSIS — G319 Degenerative disease of nervous system, unspecified: Secondary | ICD-10-CM | POA: Diagnosis not present

## 2022-10-25 DIAGNOSIS — I129 Hypertensive chronic kidney disease with stage 1 through stage 4 chronic kidney disease, or unspecified chronic kidney disease: Secondary | ICD-10-CM | POA: Diagnosis not present

## 2022-10-25 DIAGNOSIS — E1122 Type 2 diabetes mellitus with diabetic chronic kidney disease: Secondary | ICD-10-CM | POA: Diagnosis not present

## 2022-10-25 DIAGNOSIS — S81802D Unspecified open wound, left lower leg, subsequent encounter: Secondary | ICD-10-CM | POA: Diagnosis not present

## 2022-10-25 DIAGNOSIS — M47812 Spondylosis without myelopathy or radiculopathy, cervical region: Secondary | ICD-10-CM | POA: Diagnosis not present

## 2022-10-31 NOTE — Progress Notes (Signed)
There were no vitals taken for this visit.   Subjective:    Patient ID: Ryan Kirby, male    DOB: June 13, 1941, 82 y.o.   MRN: [id number]  HPI: Ryan Kirby is a 82 y.o. male  No chief complaint on file.  HIP PAIN Duration: {Blank single:19197::"chronic","days","weeks","months"} Involved hip: {Blank single:19197::"left","right","bilateral"}  Mechanism of injury: {Blank single:19197::"trauma","unknown"} Location: {Blank single:19197::"anterior","posterior","lateral","medial","diffuse"} Onset: {Blank single:19197::"sudden","gradual"}  Severity: {Blank single:19197::"mild","moderate","severe","1/10","2/10","3/10","4/10","5/10","6/10","7/10","8/10","9/10","10/10"}  Quality: {Blank multiple:19196::"sharp","dull","aching","burning","cramping","ill-defined","itchy","pressure-like","pulling","shooting","sore","stabbing","tender","tearing","throbbing"} Frequency: {Blank single:19197::"constant","intermittent","occasional","rare","every few minutes","a few times a hour","a few times a day","a few times a week","a few times a month","a few times a year"} Radiation: {Blank single:19197::"yes","no"} Aggravating factors: {Blank multiple:19196::"weight bearing","walking","running","stairs","bending","movement","prolonged sitting"}   Alleviating factors: {Blank multiple:19196::"nothing","ice","physical therapy","HEP","APAP","NSAIDs","crutches","rest"}  Status: {Blank multiple:19196::"better","worse","stable","fluctuating"} Treatments attempted: {Blank multiple:19196::"none","rest","ice","heat","APAP","ibuprofen","aleve","physical therapy","HEP","chiropractor"}   Relief with NSAIDs?: {Blank single:19197::"No NSAIDs Taken","no","mild","moderate","significant"} Weakness with weight bearing: {Blank single:19197::"yes","no"} Weakness with walking: {Blank single:19197::"yes","no"} Paresthesias / decreased sensation: {Blank single:19197::"yes","no"} Swelling: {Blank  single:19197::"yes","no"} Redness:{Blank single:19197::"yes","no"} Fevers: {Blank single:19197::"yes","no"}  Relevant past medical, surgical, family and social history reviewed and updated as indicated. Interim medical history since our last visit reviewed. Allergies and medications reviewed and updated.  Review of Systems  Per HPI unless specifically indicated above     Objective:    There were no vitals taken for this visit.  Wt Readings from Last 3 Encounters:  10/10/22 161 lb 8 oz (73.3 kg)  09/26/22 172 lb 9.9 oz (78.3 kg)  09/20/22 179 lb (81.2 kg)    Physical Exam  Results for orders placed or performed in visit on 10/10/22  CBC w/Diff  Result Value Ref Range   WBC 5.2 3.4 - 10.8 x10E3/uL   RBC 4.12 (L) 4.14 - 5.80 x10E6/uL   Hemoglobin 12.3 (L) 13.0 - 17.7 g/dL   Hematocrit 37.6 37.5 - 51.0 %   MCV 91 79 - 97 fL   MCH 29.9 26.6 - 33.0 pg   MCHC 32.7 31.5 - 35.7 g/dL   RDW 13.1 11.6 - 15.4 %   Platelets 188 150 - 450 x10E3/uL   Neutrophils 93 Not Estab. %   Lymphs 5 Not Estab. %   Monocytes 2 Not Estab. %   Eos 0 Not Estab. %   Basos 0 Not Estab. %   Neutrophils Absolute 4.7 1.4 - 7.0 x10E3/uL   Lymphocytes Absolute 0.3 (L) 0.7 - 3.1 x10E3/uL   Monocytes Absolute 0.1 0.1 - 0.9 x10E3/uL   EOS (ABSOLUTE) 0.0 0.0 - 0.4 x10E3/uL   Basophils Absolute 0.0 0.0 - 0.2 x10E3/uL   Immature Granulocytes 0 Not Estab. %   Immature Grans (Abs) 0.0 0.0 - 0.1 x10E3/uL  Comp Met (CMET)  Result Value Ref Range   Glucose 160 (H) 70 - 99 mg/dL   BUN 25 8 - 27 mg/dL   Creatinine, Ser 1.30 (H) 0.76 - 1.27 mg/dL   eGFR 55 (L) >59 mL/min/1.73   BUN/Creatinine Ratio 19 10 - 24   Sodium 138 134 - 144 mmol/L   Potassium 4.6 3.5 - 5.2 mmol/L   Chloride 99 96 - 106 mmol/L   CO2 22 20 - 29 mmol/L   Calcium 9.4 8.6 - 10.2 mg/dL   Total Protein 6.2 6.0 - 8.5 g/dL   Albumin 4.1 3.7 - 4.7 g/dL   Globulin, Total 2.1 1.5 - 4.5 g/dL   Albumin/Globulin Ratio 2.0 1.2 - 2.2   Bilirubin  Total 0.4 0.0 - 1.2 mg/dL   Alkaline Phosphatase 63 44 - 121 IU/L   AST 17 0 - 40 IU/L   ALT 10  0 - 44 IU/L  Magnesium  Result Value Ref Range   Magnesium 1.4 (L) 1.6 - 2.3 mg/dL  Phosphorus  Result Value Ref Range   Phosphorus 3.3 2.8 - 4.1 mg/dL      Assessment & Plan:   Problem List Items Addressed This Visit   None    Follow up plan: No follow-ups on file.

## 2022-11-01 ENCOUNTER — Ambulatory Visit (INDEPENDENT_AMBULATORY_CARE_PROVIDER_SITE_OTHER): Payer: Medicare HMO | Admitting: Nurse Practitioner

## 2022-11-01 ENCOUNTER — Encounter: Payer: Self-pay | Admitting: Nurse Practitioner

## 2022-11-01 VITALS — BP 160/89 | HR 118 | Wt 170.2 lb

## 2022-11-01 DIAGNOSIS — E1165 Type 2 diabetes mellitus with hyperglycemia: Secondary | ICD-10-CM | POA: Diagnosis not present

## 2022-11-01 DIAGNOSIS — M25552 Pain in left hip: Secondary | ICD-10-CM | POA: Diagnosis not present

## 2022-11-01 MED ORDER — DOXAZOSIN MESYLATE 2 MG PO TABS: 2.0000 mg | ORAL_TABLET | Freq: Every morning | ORAL | 1 refills | Status: DC

## 2022-11-01 MED ORDER — FUROSEMIDE 40 MG PO TABS: 40.0000 mg | ORAL_TABLET | Freq: Every day | ORAL | 1 refills | Status: DC

## 2022-11-01 MED ORDER — OMEPRAZOLE MAGNESIUM 20 MG PO TBEC: 20.0000 mg | DELAYED_RELEASE_TABLET | Freq: Every day | ORAL | 1 refills | Status: DC | PRN

## 2022-11-01 MED ORDER — METFORMIN HCL 500 MG PO TABS: 1000.0000 mg | ORAL_TABLET | Freq: Two times a day (BID) | ORAL | 1 refills | Status: DC

## 2022-11-01 MED ORDER — AMLODIPINE BESYLATE 10 MG PO TABS: 10.0000 mg | ORAL_TABLET | Freq: Every day | ORAL | 1 refills | Status: DC

## 2022-11-01 MED ORDER — PRAVASTATIN SODIUM 40 MG PO TABS: 40.0000 mg | ORAL_TABLET | Freq: Every day | ORAL | 1 refills | Status: DC

## 2022-11-01 MED ORDER — MAG-OXIDE 200 MG PO TABS: 200.0000 mg | ORAL_TABLET | Freq: Every day | ORAL | 1 refills | Status: AC

## 2022-11-01 MED ORDER — POTASSIUM CHLORIDE CRYS ER 20 MEQ PO TBCR: 20.0000 meq | EXTENDED_RELEASE_TABLET | Freq: Every day | ORAL | 1 refills | Status: DC

## 2022-11-01 NOTE — Assessment & Plan Note (Signed)
Chronic. Improved.  Sugars have been running 110-130s without insulin.  Will stop insulin.  If A1c remains elevated at next visit will add SGLT2.  Follow up in 1 month.  Call sooner if concerns arise.

## 2022-11-01 NOTE — Assessment & Plan Note (Signed)
Ongoing.  Not improved.  Called Ortho and made patient an appointment for tomorrow for evaluation and management of hip arthritis.

## 2022-11-02 DIAGNOSIS — E1122 Type 2 diabetes mellitus with diabetic chronic kidney disease: Secondary | ICD-10-CM | POA: Diagnosis not present

## 2022-11-02 DIAGNOSIS — I2510 Atherosclerotic heart disease of native coronary artery without angina pectoris: Secondary | ICD-10-CM | POA: Diagnosis not present

## 2022-11-02 DIAGNOSIS — G319 Degenerative disease of nervous system, unspecified: Secondary | ICD-10-CM | POA: Diagnosis not present

## 2022-11-02 DIAGNOSIS — I129 Hypertensive chronic kidney disease with stage 1 through stage 4 chronic kidney disease, or unspecified chronic kidney disease: Secondary | ICD-10-CM | POA: Diagnosis not present

## 2022-11-02 DIAGNOSIS — M47812 Spondylosis without myelopathy or radiculopathy, cervical region: Secondary | ICD-10-CM | POA: Diagnosis not present

## 2022-11-02 DIAGNOSIS — M1388 Other specified arthritis, other site: Secondary | ICD-10-CM | POA: Diagnosis not present

## 2022-11-02 DIAGNOSIS — E785 Hyperlipidemia, unspecified: Secondary | ICD-10-CM | POA: Diagnosis not present

## 2022-11-02 DIAGNOSIS — S81802D Unspecified open wound, left lower leg, subsequent encounter: Secondary | ICD-10-CM | POA: Diagnosis not present

## 2022-11-02 DIAGNOSIS — M7062 Trochanteric bursitis, left hip: Secondary | ICD-10-CM | POA: Diagnosis not present

## 2022-11-02 DIAGNOSIS — M1612 Unilateral primary osteoarthritis, left hip: Secondary | ICD-10-CM | POA: Diagnosis not present

## 2022-11-02 DIAGNOSIS — N183 Chronic kidney disease, stage 3 unspecified: Secondary | ICD-10-CM | POA: Diagnosis not present

## 2022-11-07 ENCOUNTER — Ambulatory Visit: Payer: Self-pay | Admitting: *Deleted

## 2022-11-07 DIAGNOSIS — G319 Degenerative disease of nervous system, unspecified: Secondary | ICD-10-CM | POA: Diagnosis not present

## 2022-11-07 DIAGNOSIS — N183 Chronic kidney disease, stage 3 unspecified: Secondary | ICD-10-CM | POA: Diagnosis not present

## 2022-11-07 DIAGNOSIS — I129 Hypertensive chronic kidney disease with stage 1 through stage 4 chronic kidney disease, or unspecified chronic kidney disease: Secondary | ICD-10-CM | POA: Diagnosis not present

## 2022-11-07 DIAGNOSIS — E785 Hyperlipidemia, unspecified: Secondary | ICD-10-CM | POA: Diagnosis not present

## 2022-11-07 DIAGNOSIS — M47812 Spondylosis without myelopathy or radiculopathy, cervical region: Secondary | ICD-10-CM | POA: Diagnosis not present

## 2022-11-07 DIAGNOSIS — M1612 Unilateral primary osteoarthritis, left hip: Secondary | ICD-10-CM | POA: Diagnosis not present

## 2022-11-07 DIAGNOSIS — I2510 Atherosclerotic heart disease of native coronary artery without angina pectoris: Secondary | ICD-10-CM | POA: Diagnosis not present

## 2022-11-07 DIAGNOSIS — S81802D Unspecified open wound, left lower leg, subsequent encounter: Secondary | ICD-10-CM | POA: Diagnosis not present

## 2022-11-07 DIAGNOSIS — E1122 Type 2 diabetes mellitus with diabetic chronic kidney disease: Secondary | ICD-10-CM | POA: Diagnosis not present

## 2022-11-07 NOTE — Patient Outreach (Signed)
  Care Coordination   11/07/2022 Name: Ryan Kirby MRN: [id number] DOB: Dec 09, 1940   Care Coordination Outreach Attempts:  An unsuccessful telephone outreach was attempted for a scheduled appointment today.  Follow Up Plan:  Additional outreach attempts will be made to offer the patient care coordination information and services.   Encounter Outcome:  No Answer   Care Coordination Interventions:  No, not indicated    Valente David, RN, MSN, [facility] Care Management Care Management Coordinator [phone]

## 2022-11-08 ENCOUNTER — Other Ambulatory Visit: Payer: Self-pay | Admitting: Nurse Practitioner

## 2022-11-08 DIAGNOSIS — E1165 Type 2 diabetes mellitus with hyperglycemia: Secondary | ICD-10-CM

## 2022-11-08 NOTE — Telephone Encounter (Signed)
Medication Refill - Medication: amLODipine (NORVASC) 10 MG tablet [id number] doxazosin (CARDURA) 2 MG tablet [id number] pravastatin (PRAVACHOL) 40 MG tablet [id number]   Has the patient contacted their pharmacy? Yes.    (Agent: If yes, when and what did the pharmacy advise?) Contact Provider   Preferred Pharmacy (with phone number or street name): [place], [place]   Has the patient been seen for an appointment in the last year OR does the patient have an upcoming appointment? Yes.    Agent: Please be advised that RX refills may take up to 3 business days. We ask that you follow-up with your pharmacy.

## 2022-11-09 DIAGNOSIS — G319 Degenerative disease of nervous system, unspecified: Secondary | ICD-10-CM | POA: Diagnosis not present

## 2022-11-09 DIAGNOSIS — M47812 Spondylosis without myelopathy or radiculopathy, cervical region: Secondary | ICD-10-CM | POA: Diagnosis not present

## 2022-11-09 DIAGNOSIS — I129 Hypertensive chronic kidney disease with stage 1 through stage 4 chronic kidney disease, or unspecified chronic kidney disease: Secondary | ICD-10-CM | POA: Diagnosis not present

## 2022-11-09 DIAGNOSIS — I2510 Atherosclerotic heart disease of native coronary artery without angina pectoris: Secondary | ICD-10-CM | POA: Diagnosis not present

## 2022-11-09 DIAGNOSIS — N183 Chronic kidney disease, stage 3 unspecified: Secondary | ICD-10-CM | POA: Diagnosis not present

## 2022-11-09 DIAGNOSIS — E1122 Type 2 diabetes mellitus with diabetic chronic kidney disease: Secondary | ICD-10-CM | POA: Diagnosis not present

## 2022-11-09 DIAGNOSIS — M1612 Unilateral primary osteoarthritis, left hip: Secondary | ICD-10-CM | POA: Diagnosis not present

## 2022-11-09 DIAGNOSIS — E785 Hyperlipidemia, unspecified: Secondary | ICD-10-CM | POA: Diagnosis not present

## 2022-11-09 DIAGNOSIS — S81802D Unspecified open wound, left lower leg, subsequent encounter: Secondary | ICD-10-CM | POA: Diagnosis not present

## 2022-11-09 NOTE — Telephone Encounter (Signed)
Medications refilled 10/2922.

## 2022-11-13 ENCOUNTER — Telehealth: Payer: Self-pay | Admitting: *Deleted

## 2022-11-13 NOTE — Progress Notes (Signed)
  Care Coordination Note  11/13/2022 Name: SHYHIEM BEENEY MRN: [id number] DOB: May 28, 1941  ISAIAH CIANCI is a 82 y.o. year old male who is a primary care patient of Jon Billings, NP and is actively engaged with the care management team. I reached out to [name] by phone today to assist with re-scheduling a follow up visit with the RN Case Manager  Follow up plan: Unsuccessful telephone outreach attempt made. A HIPAA compliant phone message was left for the patient providing contact information and requesting a return call.   Julian Hy, [place] Direct Dial: [phone]

## 2022-11-14 ENCOUNTER — Ambulatory Visit: Payer: Medicare HMO | Admitting: Nurse Practitioner

## 2022-11-15 DIAGNOSIS — G319 Degenerative disease of nervous system, unspecified: Secondary | ICD-10-CM | POA: Diagnosis not present

## 2022-11-15 DIAGNOSIS — N183 Chronic kidney disease, stage 3 unspecified: Secondary | ICD-10-CM | POA: Diagnosis not present

## 2022-11-15 DIAGNOSIS — I129 Hypertensive chronic kidney disease with stage 1 through stage 4 chronic kidney disease, or unspecified chronic kidney disease: Secondary | ICD-10-CM | POA: Diagnosis not present

## 2022-11-15 DIAGNOSIS — S81802D Unspecified open wound, left lower leg, subsequent encounter: Secondary | ICD-10-CM | POA: Diagnosis not present

## 2022-11-15 DIAGNOSIS — I2510 Atherosclerotic heart disease of native coronary artery without angina pectoris: Secondary | ICD-10-CM | POA: Diagnosis not present

## 2022-11-15 DIAGNOSIS — M47812 Spondylosis without myelopathy or radiculopathy, cervical region: Secondary | ICD-10-CM | POA: Diagnosis not present

## 2022-11-15 DIAGNOSIS — M1612 Unilateral primary osteoarthritis, left hip: Secondary | ICD-10-CM | POA: Diagnosis not present

## 2022-11-15 DIAGNOSIS — E1122 Type 2 diabetes mellitus with diabetic chronic kidney disease: Secondary | ICD-10-CM | POA: Diagnosis not present

## 2022-11-15 DIAGNOSIS — E785 Hyperlipidemia, unspecified: Secondary | ICD-10-CM | POA: Diagnosis not present

## 2022-11-24 DIAGNOSIS — M1612 Unilateral primary osteoarthritis, left hip: Secondary | ICD-10-CM | POA: Diagnosis not present

## 2022-11-24 DIAGNOSIS — E1122 Type 2 diabetes mellitus with diabetic chronic kidney disease: Secondary | ICD-10-CM | POA: Diagnosis not present

## 2022-11-24 DIAGNOSIS — G319 Degenerative disease of nervous system, unspecified: Secondary | ICD-10-CM | POA: Diagnosis not present

## 2022-11-24 DIAGNOSIS — N183 Chronic kidney disease, stage 3 unspecified: Secondary | ICD-10-CM | POA: Diagnosis not present

## 2022-11-24 DIAGNOSIS — M47812 Spondylosis without myelopathy or radiculopathy, cervical region: Secondary | ICD-10-CM | POA: Diagnosis not present

## 2022-11-24 DIAGNOSIS — S81802D Unspecified open wound, left lower leg, subsequent encounter: Secondary | ICD-10-CM | POA: Diagnosis not present

## 2022-11-24 DIAGNOSIS — I129 Hypertensive chronic kidney disease with stage 1 through stage 4 chronic kidney disease, or unspecified chronic kidney disease: Secondary | ICD-10-CM | POA: Diagnosis not present

## 2022-11-24 DIAGNOSIS — I2510 Atherosclerotic heart disease of native coronary artery without angina pectoris: Secondary | ICD-10-CM | POA: Diagnosis not present

## 2022-11-24 DIAGNOSIS — E785 Hyperlipidemia, unspecified: Secondary | ICD-10-CM | POA: Diagnosis not present

## 2022-11-28 DIAGNOSIS — H40003 Preglaucoma, unspecified, bilateral: Secondary | ICD-10-CM | POA: Diagnosis not present

## 2022-11-28 DIAGNOSIS — H2512 Age-related nuclear cataract, left eye: Secondary | ICD-10-CM | POA: Diagnosis not present

## 2022-11-28 DIAGNOSIS — H2513 Age-related nuclear cataract, bilateral: Secondary | ICD-10-CM | POA: Diagnosis not present

## 2022-11-28 DIAGNOSIS — E119 Type 2 diabetes mellitus without complications: Secondary | ICD-10-CM | POA: Diagnosis not present

## 2022-11-29 NOTE — Progress Notes (Signed)
  Care Coordination Note  11/29/2022 Name: Ryan Kirby MRN: [id number] DOB: Jan 04, 1941  Ryan Kirby is a 82 y.o. year old male who is a primary care patient of Jon Billings, NP and is actively engaged with the care management team. I reached out to [name] by phone today to assist with re-scheduling a follow up visit with the RN Case Manager  Follow up plan: We have been unable to make contact with the patient for follow up.   Julian Hy, [place] Direct Dial: [phone]

## 2022-12-04 NOTE — Progress Notes (Unsigned)
There were no vitals taken for this visit.   Subjective:    Patient ID: Ryan Kirby, male    DOB: 18-Dec-1940, 82 y.o.   MRN: [id number]  HPI: Ryan Kirby is a 82 y.o. male  No chief complaint on file.  HYPERTENSION / HYPERLIPIDEMIA Satisfied with current treatment? no Duration of hypertension: years BP monitoring frequency: not checking BP range:  BP medication side effects: no Past BP meds: amlodipine and benazepril/HCTZ Duration of hyperlipidemia: years Cholesterol medication side effects: no Cholesterol supplements: none Past cholesterol medications: pravastatin (pravachol) Medication compliance: excellent compliance Aspirin: no Recent stressors: no Recurrent headaches: no Visual changes: no Palpitations: no Dyspnea: no Chest pain: no Lower extremity edema: no Dizzy/lightheaded: no  DIABETES Hypoglycemic episodes:no Polydipsia/polyuria: no Visual disturbance: no Chest pain: no Paresthesias: no Glucose Monitoring: no  Accucheck frequency: Not Checking  Fasting glucose:  Post prandial:  Evening:  Before meals: Taking Insulin?: no  Long acting insulin:  Short acting insulin: Blood Pressure Monitoring: not checking Retinal Examination: Up to Date Foot Exam: Up to Date Diabetic Education: Not Completed Pneumovax: Up to Date Influenza: Up to Date Aspirin: yes  HIP PAIN Patient presents to clinic with complain Hip pain that has been going on for awhile.  He has had cortisone injections in the past and feels like he is due for one.    Relevant past medical, surgical, family and social history reviewed and updated as indicated. Interim medical history since our last visit reviewed. Allergies and medications reviewed and updated.  Review of Systems  Eyes:  Negative for visual disturbance.  Respiratory:  Negative for chest tightness and shortness of breath.   Cardiovascular:  Negative for chest pain, palpitations and leg swelling.  Endocrine: Negative  for polydipsia and polyuria.  Musculoskeletal:        Hip pain  Neurological:  Negative for dizziness, light-headedness, numbness and headaches.    Per HPI unless specifically indicated above     Objective:    There were no vitals taken for this visit.  Wt Readings from Last 3 Encounters:  11/01/22 170 lb 3.2 oz (77.2 kg)  10/10/22 161 lb 8 oz (73.3 kg)  09/26/22 172 lb 9.9 oz (78.3 kg)    Physical Exam Vitals and nursing note reviewed.  Constitutional:      General: He is not in acute distress.    Appearance: Normal appearance. He is not ill-appearing, toxic-appearing or diaphoretic.  HENT:     Head: Normocephalic.     Right Ear: External ear normal.     Left Ear: External ear normal.     Nose: Nose normal. No congestion or rhinorrhea.     Mouth/Throat:     Mouth: Mucous membranes are moist.  Eyes:     General:        Right eye: No discharge.        Left eye: No discharge.     Extraocular Movements: Extraocular movements intact.     Conjunctiva/sclera: Conjunctivae normal.     Pupils: Pupils are equal, round, and reactive to light.  Cardiovascular:     Rate and Rhythm: Normal rate and regular rhythm.     Heart sounds: No murmur heard. Pulmonary:     Effort: Pulmonary effort is normal. No respiratory distress.     Breath sounds: Normal breath sounds. No wheezing, rhonchi or rales.  Abdominal:     General: Abdomen is flat. Bowel sounds are normal.  Musculoskeletal:     Cervical back:  Normal range of motion and neck supple.  Skin:    General: Skin is warm and dry.     Capillary Refill: Capillary refill takes less than 2 seconds.  Neurological:     General: No focal deficit present.     Mental Status: He is alert and oriented to person, place, and time.  Psychiatric:        Mood and Affect: Mood normal.        Behavior: Behavior normal.        Thought Content: Thought content normal.        Judgment: Judgment normal.    Results for orders placed or performed in  visit on 10/10/22  CBC w/Diff  Result Value Ref Range   WBC 5.2 3.4 - 10.8 x10E3/uL   RBC 4.12 (L) 4.14 - 5.80 x10E6/uL   Hemoglobin 12.3 (L) 13.0 - 17.7 g/dL   Hematocrit 37.6 37.5 - 51.0 %   MCV 91 79 - 97 fL   MCH 29.9 26.6 - 33.0 pg   MCHC 32.7 31.5 - 35.7 g/dL   RDW 13.1 11.6 - 15.4 %   Platelets 188 150 - 450 x10E3/uL   Neutrophils 93 Not Estab. %   Lymphs 5 Not Estab. %   Monocytes 2 Not Estab. %   Eos 0 Not Estab. %   Basos 0 Not Estab. %   Neutrophils Absolute 4.7 1.4 - 7.0 x10E3/uL   Lymphocytes Absolute 0.3 (L) 0.7 - 3.1 x10E3/uL   Monocytes Absolute 0.1 0.1 - 0.9 x10E3/uL   EOS (ABSOLUTE) 0.0 0.0 - 0.4 x10E3/uL   Basophils Absolute 0.0 0.0 - 0.2 x10E3/uL   Immature Granulocytes 0 Not Estab. %   Immature Grans (Abs) 0.0 0.0 - 0.1 x10E3/uL  Comp Met (CMET)  Result Value Ref Range   Glucose 160 (H) 70 - 99 mg/dL   BUN 25 8 - 27 mg/dL   Creatinine, Ser 1.30 (H) 0.76 - 1.27 mg/dL   eGFR 55 (L) >59 mL/min/1.73   BUN/Creatinine Ratio 19 10 - 24   Sodium 138 134 - 144 mmol/L   Potassium 4.6 3.5 - 5.2 mmol/L   Chloride 99 96 - 106 mmol/L   CO2 22 20 - 29 mmol/L   Calcium 9.4 8.6 - 10.2 mg/dL   Total Protein 6.2 6.0 - 8.5 g/dL   Albumin 4.1 3.7 - 4.7 g/dL   Globulin, Total 2.1 1.5 - 4.5 g/dL   Albumin/Globulin Ratio 2.0 1.2 - 2.2   Bilirubin Total 0.4 0.0 - 1.2 mg/dL   Alkaline Phosphatase 63 44 - 121 IU/L   AST 17 0 - 40 IU/L   ALT 10 0 - 44 IU/L  Magnesium  Result Value Ref Range   Magnesium 1.4 (L) 1.6 - 2.3 mg/dL  Phosphorus  Result Value Ref Range   Phosphorus 3.3 2.8 - 4.1 mg/dL      Assessment & Plan:   Problem List Items Addressed This Visit   None    Follow up plan: No follow-ups on file.

## 2022-12-05 ENCOUNTER — Encounter: Payer: Self-pay | Admitting: Nurse Practitioner

## 2022-12-05 ENCOUNTER — Ambulatory Visit (INDEPENDENT_AMBULATORY_CARE_PROVIDER_SITE_OTHER): Payer: Medicare HMO | Admitting: Nurse Practitioner

## 2022-12-05 VITALS — BP 138/68 | HR 103 | Temp 98.1°F | Wt 166.3 lb

## 2022-12-05 DIAGNOSIS — E1159 Type 2 diabetes mellitus with other circulatory complications: Secondary | ICD-10-CM

## 2022-12-05 DIAGNOSIS — E785 Hyperlipidemia, unspecified: Secondary | ICD-10-CM

## 2022-12-05 DIAGNOSIS — E1165 Type 2 diabetes mellitus with hyperglycemia: Secondary | ICD-10-CM

## 2022-12-05 DIAGNOSIS — E1169 Type 2 diabetes mellitus with other specified complication: Secondary | ICD-10-CM | POA: Diagnosis not present

## 2022-12-05 DIAGNOSIS — I152 Hypertension secondary to endocrine disorders: Secondary | ICD-10-CM | POA: Diagnosis not present

## 2022-12-05 LAB — MICROALBUMIN, URINE WAIVED
Creatinine, Urine Waived: 50 mg/dL (ref 10–300)
Microalb, Ur Waived: 150 mg/L — ABNORMAL HIGH (ref 0–19)
Microalb/Creat Ratio: >300 mg/g — ABNORMAL HIGH (ref <30)

## 2022-12-05 NOTE — Assessment & Plan Note (Signed)
Chronic.  Controlled.  Continue with current medication regimen on Pravastatin 40mg  daily.  Follow up in 3 months. Call sooner if concerns arise.

## 2022-12-05 NOTE — Assessment & Plan Note (Signed)
Chronic.  Controlled.  Continue with current medication regimen of Amlodipine and Furosemide.  Can resume Benazepril if blood pressure increases.  Patient on Furosemide 40mg  without diagnosis of CHF and euvolemic.  Return to clinic in 3 months for reevaluation.  Labs ordered today.  Call sooner if concerns arise.

## 2022-12-05 NOTE — Assessment & Plan Note (Signed)
Chronic. Improved.  Sugars have been running 110-160s in the evening without insulin.  Patient has not been on insulin since last visit. If A1c remains elevated at next visit will add SGLT2.  Follow up in 3 month.  Call sooner if concerns arise.

## 2022-12-06 LAB — COMPREHENSIVE METABOLIC PANEL
ALT: 9 IU/L (ref 0–44)
AST: 18 IU/L (ref 0–40)
Albumin/Globulin Ratio: 2.3 — ABNORMAL HIGH (ref 1.2–2.2)
Albumin: 4.3 g/dL (ref 3.7–4.7)
Alkaline Phosphatase: 81 IU/L (ref 44–121)
BUN/Creatinine Ratio: 18 (ref 10–24)
BUN: 20 mg/dL (ref 8–27)
Bilirubin Total: 0.4 mg/dL (ref 0.0–1.2)
CO2: 21 mmol/L (ref 20–29)
Calcium: 10.2 mg/dL (ref 8.6–10.2)
Chloride: 103 mmol/L (ref 96–106)
Creatinine, Ser: 1.11 mg/dL (ref 0.76–1.27)
Globulin, Total: 1.9 g/dL (ref 1.5–4.5)
Glucose: 111 mg/dL — ABNORMAL HIGH (ref 70–99)
Potassium: 3.4 mmol/L — ABNORMAL LOW (ref 3.5–5.2)
Sodium: 141 mmol/L (ref 134–144)
Total Protein: 6.2 g/dL (ref 6.0–8.5)
eGFR: 67 mL/min/{1.73_m2} (ref >59)

## 2022-12-06 LAB — HEMOGLOBIN A1C
Est. average glucose Bld gHb Est-mCnc: 137 mg/dL
Hgb A1c MFr Bld: 6.4 % — ABNORMAL HIGH (ref 4.8–5.6)

## 2022-12-06 NOTE — Progress Notes (Signed)
Good Morning.  Ryan Kirby lab work looks great.  His kidney function has improved back to normal.  His A1c is 6.4% which is better than it was before he had his hospitalization.  The diet changes have made a big difference. Continue with current medication regimen.  Follow up as discussed.  Keep up the good work.

## 2022-12-12 ENCOUNTER — Ambulatory Visit: Payer: Medicare HMO | Admitting: Cardiovascular Disease

## 2022-12-12 DIAGNOSIS — H2511 Age-related nuclear cataract, right eye: Secondary | ICD-10-CM | POA: Diagnosis not present

## 2022-12-12 DIAGNOSIS — H2512 Age-related nuclear cataract, left eye: Secondary | ICD-10-CM | POA: Diagnosis not present

## 2022-12-18 ENCOUNTER — Encounter: Payer: Self-pay | Admitting: Ophthalmology

## 2022-12-19 NOTE — Discharge Instructions (Signed)

## 2022-12-19 NOTE — Anesthesia Preprocedure Evaluation (Addendum)
Anesthesia Evaluation  Patient identified by MRN, date of birth, ID band Patient awake    Reviewed: Allergy & Precautions, H&P , NPO status , Patient's Chart, lab work & pertinent test results  Airway Mallampati: III  TM Distance: <3 FB Neck ROM: Full    Dental no notable dental hx.    Pulmonary neg pulmonary ROS   Pulmonary exam normal breath sounds clear to auscultation       Cardiovascular hypertension, + CAD and + Peripheral Vascular Disease  Normal cardiovascular exam+ Valvular Problems/Murmurs  Rhythm:Regular Rate:Normal  Hx CABG    Neuro/Psych negative neurological ROS  negative psych ROS   GI/Hepatic Neg liver ROS,GERD  ,,  Endo/Other  diabetes, Type 2, Oral Hypoglycemic Agents    Renal/GU Renal diseasenegative Renal ROS  negative genitourinary   Musculoskeletal negative musculoskeletal ROS (+) Arthritis ,    Abdominal   Peds negative pediatric ROS (+)  Hematology negative hematology ROS (+)   Anesthesia Other Findings CAD (coronary artery disease) Hyperlipidemia Diabetes mellitus without complication Chronic kidney disease  GERD (gastroesophageal reflux disease) Hypertension  Arthritis Acute metabolic encephalopathy  AKI (acute kidney injury) Hypercalcemia H ypertensive urgency Hypokalemia  Lactic acidosis Sinus bradycardia     Reproductive/Obstetrics negative OB ROS                              Anesthesia Physical Anesthesia Plan  ASA: 3  Anesthesia Plan: MAC   Post-op Pain Management:    Induction: Intravenous  PONV Risk Score and Plan:   Airway Management Planned: Natural Airway and Nasal Cannula  Additional Equipment:   Intra-op Plan:   Post-operative Plan:   Informed Consent: I have reviewed the patients History and Physical, chart, labs and discussed the procedure including the risks, benefits and alternatives for the proposed anesthesia with the  patient or authorized representative who has indicated his/her understanding and acceptance.     Dental Advisory Given  Plan Discussed with: Anesthesiologist, CRNA and Surgeon  Anesthesia Plan Comments: (Patient consented for risks of anesthesia including but not limited to:  - adverse reactions to medications - damage to eyes, teeth, lips or other oral mucosa - nerve damage due to positioning  - sore throat or hoarseness - Damage to heart, brain, nerves, lungs, other parts of body or loss of life  Patient voiced understanding.)         Anesthesia Quick Evaluation

## 2022-12-24 ENCOUNTER — Encounter
Admission: RE | Disposition: A | Discharge status: Home / Self Care | Payer: Self-pay | Source: Home / Self Care | Attending: Ophthalmology

## 2022-12-24 ENCOUNTER — Ambulatory Visit
Admission: RE | Admit: 2022-12-24 | Discharge: 2022-12-24 | Disposition: A | Discharge status: Home / Self Care | Payer: Medicare HMO | Attending: Ophthalmology | Admitting: Ophthalmology

## 2022-12-24 ENCOUNTER — Encounter: Payer: Self-pay | Admitting: Ophthalmology

## 2022-12-24 ENCOUNTER — Ambulatory Visit: Payer: Medicare HMO | Admitting: Anesthesiology

## 2022-12-24 ENCOUNTER — Ambulatory Visit: Payer: Medicare HMO

## 2022-12-24 ENCOUNTER — Telehealth: Payer: Medicare HMO

## 2022-12-24 ENCOUNTER — Other Ambulatory Visit: Payer: Self-pay

## 2022-12-24 DIAGNOSIS — K219 Gastro-esophageal reflux disease without esophagitis: Secondary | ICD-10-CM | POA: Insufficient documentation

## 2022-12-24 DIAGNOSIS — Z8249 Family history of ischemic heart disease and other diseases of the circulatory system: Secondary | ICD-10-CM | POA: Diagnosis not present

## 2022-12-24 DIAGNOSIS — M1990 Unspecified osteoarthritis, unspecified site: Secondary | ICD-10-CM | POA: Diagnosis not present

## 2022-12-24 DIAGNOSIS — I2510 Atherosclerotic heart disease of native coronary artery without angina pectoris: Secondary | ICD-10-CM | POA: Diagnosis not present

## 2022-12-24 DIAGNOSIS — H2511 Age-related nuclear cataract, right eye: Secondary | ICD-10-CM | POA: Insufficient documentation

## 2022-12-24 DIAGNOSIS — N189 Chronic kidney disease, unspecified: Secondary | ICD-10-CM | POA: Diagnosis not present

## 2022-12-24 DIAGNOSIS — E1122 Type 2 diabetes mellitus with diabetic chronic kidney disease: Secondary | ICD-10-CM | POA: Diagnosis not present

## 2022-12-24 DIAGNOSIS — Z951 Presence of aortocoronary bypass graft: Secondary | ICD-10-CM | POA: Insufficient documentation

## 2022-12-24 DIAGNOSIS — E1136 Type 2 diabetes mellitus with diabetic cataract: Secondary | ICD-10-CM | POA: Diagnosis not present

## 2022-12-24 DIAGNOSIS — Z7984 Long term (current) use of oral hypoglycemic drugs: Secondary | ICD-10-CM | POA: Diagnosis not present

## 2022-12-24 DIAGNOSIS — Z8261 Family history of arthritis: Secondary | ICD-10-CM | POA: Diagnosis not present

## 2022-12-24 DIAGNOSIS — I129 Hypertensive chronic kidney disease with stage 1 through stage 4 chronic kidney disease, or unspecified chronic kidney disease: Secondary | ICD-10-CM | POA: Insufficient documentation

## 2022-12-24 DIAGNOSIS — E1151 Type 2 diabetes mellitus with diabetic peripheral angiopathy without gangrene: Secondary | ICD-10-CM | POA: Diagnosis not present

## 2022-12-24 DIAGNOSIS — H2512 Age-related nuclear cataract, left eye: Secondary | ICD-10-CM | POA: Diagnosis not present

## 2022-12-24 DIAGNOSIS — E785 Hyperlipidemia, unspecified: Secondary | ICD-10-CM | POA: Diagnosis not present

## 2022-12-24 DIAGNOSIS — E1165 Type 2 diabetes mellitus with hyperglycemia: Secondary | ICD-10-CM | POA: Diagnosis not present

## 2022-12-24 HISTORY — PX: CATARACT EXTRACTION W/PHACO: SHX586

## 2022-12-24 LAB — GLUCOSE, CAPILLARY: Glucose-Capillary: 137 mg/dL — ABNORMAL HIGH (ref 70–99)

## 2022-12-24 SURGERY — PHACOEMULSIFICATION, CATARACT, WITH IOL INSERTION
Anesthesia: Monitor Anesthesia Care | Site: Eye | Laterality: Right

## 2022-12-24 MED ORDER — LACTATED RINGERS IV SOLN: INTRAVENOUS | Status: DC

## 2022-12-24 MED ORDER — MOXIFLOXACIN HCL 0.5 % OP SOLN
OPHTHALMIC | Status: DC | PRN
  Administered 2022-12-24: .2 mL via OPHTHALMIC

## 2022-12-24 MED ORDER — ARMC OPHTHALMIC DILATING DROPS
1.0000 | OPHTHALMIC | Status: DC | PRN
  Administered 2022-12-24 (×3): 1 via OPHTHALMIC

## 2022-12-24 MED ORDER — TETRACAINE HCL 0.5 % OP SOLN
1.0000 [drp] | OPHTHALMIC | Status: DC | PRN
  Administered 2022-12-24 (×3): 1 [drp] via OPHTHALMIC

## 2022-12-24 MED ORDER — SIGHTPATH DOSE#1 BSS IO SOLN
INTRAOCULAR | Status: DC | PRN
  Administered 2022-12-24: 77 mL via OPHTHALMIC

## 2022-12-24 MED ORDER — FENTANYL CITRATE (PF) 100 MCG/2ML IJ SOLN
INTRAMUSCULAR | Status: DC | PRN
  Administered 2022-12-24 (×2): 50 ug via INTRAVENOUS

## 2022-12-24 MED ORDER — SIGHTPATH DOSE#1 BSS IO SOLN
INTRAOCULAR | Status: DC | PRN
  Administered 2022-12-24: 15 mL

## 2022-12-24 MED ORDER — LIDOCAINE HCL (PF) 2 % IJ SOLN
INTRAOCULAR | Status: DC | PRN
  Administered 2022-12-24: 1 mL via INTRAOCULAR

## 2022-12-24 MED ORDER — MIDAZOLAM HCL 2 MG/2ML IJ SOLN
INTRAMUSCULAR | Status: DC | PRN
  Administered 2022-12-24 (×2): 1 mg via INTRAVENOUS

## 2022-12-24 MED ORDER — SIGHTPATH DOSE#1 NA HYALUR & NA CHOND-NA HYALUR IO KIT
PACK | INTRAOCULAR | Status: DC | PRN
  Administered 2022-12-24: 1 via OPHTHALMIC

## 2022-12-24 SURGICAL SUPPLY — 18 items
CANNULA ANT/CHMB 27G (MISCELLANEOUS) IMPLANT
CANNULA ANT/CHMB 27GA (MISCELLANEOUS) IMPLANT
CATARACT SUITE SIGHTPATH (MISCELLANEOUS) ×1 IMPLANT
DISSECTOR HYDRO NUCLEUS 50X22 (MISCELLANEOUS) ×1 IMPLANT
FEE CATARACT SUITE SIGHTPATH (MISCELLANEOUS) ×1 IMPLANT
GLOVE SURG GAMMEX PI TX LF 7.5 (GLOVE) ×1 IMPLANT
GLOVE SURG SYN 8.5  E (GLOVE) ×1
GLOVE SURG SYN 8.5 E (GLOVE) ×1 IMPLANT
GLOVE SURG SYN 8.5 PF PI (GLOVE) ×1 IMPLANT
LENS IOL TECNIS EYHANCE 21.5 (Intraocular Lens) IMPLANT
NDL FILTER BLUNT 18X1 1/2 (NEEDLE) ×1 IMPLANT
NEEDLE FILTER BLUNT 18X1 1/2 (NEEDLE) ×1 IMPLANT
PACK VIT ANT 23G (MISCELLANEOUS) IMPLANT
RING MALYGIN (MISCELLANEOUS) IMPLANT
SUT ETHILON 10-0 CS-B-6CS-B-6 (SUTURE)
SUTURE EHLN 10-0 CS-B-6CS-B-6 (SUTURE) IMPLANT
SYR 3ML LL SCALE MARK (SYRINGE) ×1 IMPLANT
SYR 5ML LL (SYRINGE) ×1 IMPLANT

## 2022-12-24 NOTE — Anesthesia Postprocedure Evaluation (Signed)
Anesthesia Post Note  Patient: Ryan Kirby  Procedure(s) Performed: CATARACT EXTRACTION PHACO AND INTRAOCULAR LENS PLACEMENT (IOC) RIGHT (Right: Eye)  Patient location during evaluation: PACU Anesthesia Type: MAC Level of consciousness: awake and alert Pain management: pain level controlled Vital Signs Assessment: post-procedure vital signs reviewed and stable Respiratory status: spontaneous breathing, nonlabored ventilation, respiratory function stable and patient connected to nasal cannula oxygen Cardiovascular status: stable and blood pressure returned to baseline Postop Assessment: no apparent nausea or vomiting Anesthetic complications: no   No notable events documented.   Last Vitals:  Vitals:   12/24/22 1035 12/24/22 1040  BP: (!) 156/88 (!) 149/81  Pulse: (!) 106 (!) 101  Resp: 14 15  Temp: 36.5 C 36.5 C  SpO2: 98% 97%    Last Pain:  Vitals:   12/24/22 1040  TempSrc:   PainSc: 0-No pain                 Payten Hobin C Bristyn Kulesza

## 2022-12-24 NOTE — Transfer of Care (Signed)
Immediate Anesthesia Transfer of Care Note  Patient: Ryan Kirby  Procedure(s) Performed: CATARACT EXTRACTION PHACO AND INTRAOCULAR LENS PLACEMENT (IOC) RIGHT (Right: Eye)  Patient Location: PACU  Anesthesia Type: MAC  Level of Consciousness: awake, alert  and patient cooperative  Airway and Oxygen Therapy: Patient Spontanous Breathing and Patient connected to supplemental oxygen  Post-op Assessment: Post-op Vital signs reviewed, Patient's Cardiovascular Status Stable, Respiratory Function Stable, Patent Airway and No signs of Nausea or vomiting  Post-op Vital Signs: Reviewed and stable  Complications: No notable events documented.

## 2022-12-24 NOTE — Patient Outreach (Signed)
  Care Management   Follow Up Note   12/24/2022 Name: Ryan Kirby MRN: [id number] DOB: 01-May-1941   Referred by: Larae Grooms, NP Reason for referral : Care Coordination   A second unsuccessful telephone outreach was attempted today. The patient was referred to the case management team for assistance with care management and care coordination.   Follow Up Plan: The care management team will reach out to the patient again over the next 14 days.   Artelia Laroche, Pharm.D. - [phone]

## 2022-12-24 NOTE — Op Note (Signed)
OPERATIVE NOTE  Ryan Kirby [id number] 12/24/2022   PREOPERATIVE DIAGNOSIS:  Nuclear sclerotic cataract right eye.  H25.11   POSTOPERATIVE DIAGNOSIS:    Nuclear sclerotic cataract right eye.     PROCEDURE:  Phacoemusification with posterior chamber intraocular lens placement of the right eye   LENS:   Implant Name Type Inv. Item Serial No. Manufacturer Lot No. LRB No. Used Action  LENS IOL TECNIS EYHANCE 21.5 - [id number] Intraocular Lens LENS IOL TECNIS EYHANCE 21.5 [id number] [employer]  Right 1 Implanted       Procedure(s) with comments: CATARACT EXTRACTION PHACO AND INTRAOCULAR LENS PLACEMENT (IOC) RIGHT (Right) - 6.06 0:37.7  DIB00 +21.5   ULTRASOUND TIME: 0 minutes 37 seconds.  CDE 6.06   SURGEON:  Willey Blade, MD, MPH  ANESTHESIOLOGIST: Anesthesiologist: Marisue Humble, MD CRNA: Barbette Hair, CRNA   ANESTHESIA:  Topical with tetracaine drops augmented with 1% preservative-free intracameral lidocaine.  ESTIMATED BLOOD LOSS: less than 1 mL.   COMPLICATIONS:  None.   DESCRIPTION OF PROCEDURE:  The patient was identified in the holding room and transported to the operating room and placed in the supine position under the operating microscope.  The right eye was identified as the operative eye and it was prepped and draped in the usual sterile ophthalmic fashion.   A 1.0 millimeter clear-corneal paracentesis was made at the 10:30 position. 0.5 ml of preservative-free 1% lidocaine with epinephrine was injected into the anterior chamber.  The anterior chamber was filled with viscoelastic.  A 2.4 millimeter keratome was used to make a near-clear corneal incision at the 8:00 position.  A curvilinear capsulorrhexis was made with a cystotome and capsulorrhexis forceps.  Balanced salt solution was used to hydrodissect and hydrodelineate the nucleus.   Phacoemulsification was then used in stop and chop fashion to remove the lens nucleus and epinucleus.  The remaining cortex was  then removed using the irrigation and aspiration handpiece. Viscoelastic was then placed into the capsular bag to distend it for lens placement.  A lens was then injected into the capsular bag.  The remaining viscoelastic was aspirated.   Wounds were hydrated with balanced salt solution.  The anterior chamber was inflated to a physiologic pressure with balanced salt solution.   Intracameral vigamox 0.1 mL undiluted was injected into the eye and a drop placed onto the ocular surface.  No wound leaks were noted.  The patient was taken to the recovery room in stable condition without complications of anesthesia or surgery  Willey Blade 12/24/2022, 10:34 AM

## 2022-12-24 NOTE — H&P (Signed)
[facility]   Primary Care Physician:  Larae Grooms, NP Ophthalmologist: Dr. Willey Blade  Pre-Procedure History & Physical: HPI:  Ryan Kirby is a 82 y.o. male here for cataract surgery.   Past Medical History:  Diagnosis Date   Acute metabolic encephalopathy 09/23/2022   AKI (acute kidney injury) 09/23/2022   Allergy    Arthritis    CAD (coronary artery disease)    Chronic kidney disease    pt is not on dialysis   Diabetes mellitus without complication    GERD (gastroesophageal reflux disease)    Hypercalcemia 09/23/2022   Hyperlipidemia    Hypertension    Hypertensive urgency 09/23/2022   Hypokalemia 08/18/2020   Lactic acidosis 09/23/2022   Sinus bradycardia 09/24/2022    Past Surgical History:  Procedure Laterality Date   BOWEL RESECTION  2004   CORONARY ARTERY BYPASS GRAFT     FINGER AMPUTATION Left 2011   5th   INGUINAL HERNIA REPAIR Right 06/01/2020   Procedure: HERNIA REPAIR INGUINAL ADULT, open;  Surgeon: Duanne Guess, MD;  Location: [facility] ORS;  Service: General;  Laterality: Right;   KNEE ARTHROSCOPY Right 09/12/2015   Procedure: ARTHROSCOPY KNEE, LATERAL MENISECTOMY, CHONDROPLASTY;  Surgeon: Donato Heinz, MD;  Location: [facility] ORS;  Service: Orthopedics;  Laterality: Right;   WISDOM TOOTH EXTRACTION      Prior to Admission medications   Medication Sig Start Date End Date Taking? Authorizing Provider  acetaminophen (TYLENOL) 500 MG tablet Take 500-1,000 mg by mouth every 6 (six) hours as needed for mild pain or moderate pain.   Yes [provider]  amLODipine (NORVASC) 10 MG tablet Take 1 tablet (10 mg total) by mouth daily. 11/01/22  Yes Larae Grooms, NP  aspirin EC 81 MG tablet Take 1 tablet (81 mg total) by mouth daily. Swallow whole. 09/29/22  Yes Esaw Grandchild A, DO  Blood Glucose Monitoring Suppl DEVI 1 each by Does not apply route in the morning, at noon, and at bedtime. May substitute to any manufacturer covered by  patient's insurance. 09/28/22  Yes Esaw Grandchild A, DO  docusate sodium (COLACE) 100 MG capsule Take 100 mg by mouth daily as needed for mild constipation.   Yes [provider]  doxazosin (CARDURA) 2 MG tablet Take 1 tablet (2 mg total) by mouth every morning. 11/01/22  Yes Larae Grooms, NP  furosemide (LASIX) 40 MG tablet Take 1 tablet (40 mg total) by mouth daily. 11/01/22  Yes Larae Grooms, NP  Magnesium Oxide -Mg Supplement (MAG-OXIDE) 200 MG TABS Take 1 tablet (200 mg total) by mouth daily. 11/01/22  Yes Larae Grooms, NP  metFORMIN (GLUCOPHAGE) 500 MG tablet Take 2 tablets (1,000 mg total) by mouth 2 (two) times daily. 11/01/22  Yes Larae Grooms, NP  omeprazole (PRILOSEC OTC) 20 MG tablet Take 1 tablet (20 mg total) by mouth daily as needed (GERD symptoms). 11/01/22  Yes Larae Grooms, NP  potassium chloride SA (KLOR-CON M) 20 MEQ tablet Take 1 tablet (20 mEq total) by mouth daily. 11/01/22  Yes Larae Grooms, NP  pravastatin (PRAVACHOL) 40 MG tablet Take 1 tablet (40 mg total) by mouth at bedtime. 11/01/22  Yes Larae Grooms, NP    Allergies as of 12/03/2022   (No Known Allergies)    Family History  Problem Relation Age of Onset   Arthritis Mother    Hypertension Mother    Breast cancer Mother    Arthritis Father    Hypertension Father    Colon cancer Father  Social History   Socioeconomic History   Marital status: Single    Spouse name: Not on file   Number of children: Not on file   Years of education: Not on file   Highest education level: 12th grade  Occupational History   Occupation: retired  Tobacco Use   Smoking status: Never   Smokeless tobacco: Never  Vaping Use   Vaping Use: Never used  Substance and Sexual Activity   Alcohol use: No    Alcohol/week: 0.0 standard drinks of alcohol   Drug use: No   Sexual activity: Not Currently  Other Topics Concern   Not on file  Social History Narrative   Not on file   Social  Determinants of Health   Financial Resource Strain: Low Risk  (04/18/2022)   Overall Financial Resource Strain (CARDIA)    Difficulty of Paying Living Expenses: Not hard at all  Food Insecurity: No Food Insecurity (10/01/2022)   Hunger Vital Sign    Worried About Running Out of Food in the Last Year: Never true    Ran Out of Food in the Last Year: Never true  Transportation Needs: No Transportation Needs (10/01/2022)   PRAPARE - Administrator, Civil Service (Medical): No    Lack of Transportation (Non-Medical): No  Physical Activity: Inactive (04/18/2022)   Exercise Vital Sign    Days of Exercise per Week: 0 days    Minutes of Exercise per Session: 0 min  Stress: No Stress Concern Present (04/18/2022)   [employer] of Occupational Health - Occupational Stress Questionnaire    Feeling of Stress : Not at all  Social Connections: Unknown (04/18/2022)   Social Connection and Isolation Panel [NHANES]    Frequency of Communication with Friends and Family: Twice a week    Frequency of Social Gatherings with Friends and Family: Once a week    Attends Religious Services: More than 4 times per year    Active Member of [employer] or Organizations: No    Attends Banker Meetings: Never    Marital Status: Not on file  Intimate Partner Violence: Not At Risk (09/26/2022)   Humiliation, Afraid, Rape, and Kick questionnaire    Fear of Current or Ex-Partner: No    Emotionally Abused: No    Physically Abused: No    Sexually Abused: No    Review of Systems: See HPI, otherwise negative ROS  Physical Exam: BP (!) 166/89   Pulse (!) 115   Temp 98 F (36.7 C) (Temporal)   Resp 11   Ht  (1.778 m)   Wt 74.4 kg   SpO2 97%   BMI 23.53 kg/m  General:   Alert, cooperative in NAD Head:  Normocephalic and atraumatic. Respiratory:  Normal work of breathing. Cardiovascular:  RRR  Impression/Plan: Ryan Kirby is here for cataract surgery.  Risks, benefits,  limitations, and alternatives regarding cataract surgery have been reviewed with the patient.  Questions have been answered.  All parties agreeable.   Willey Blade, MD  12/24/2022, 10:06 AM

## 2022-12-25 DIAGNOSIS — H2512 Age-related nuclear cataract, left eye: Secondary | ICD-10-CM | POA: Diagnosis not present

## 2022-12-25 DIAGNOSIS — H40003 Preglaucoma, unspecified, bilateral: Secondary | ICD-10-CM | POA: Diagnosis not present

## 2023-01-02 NOTE — Discharge Instructions (Signed)

## 2023-01-07 ENCOUNTER — Encounter
Admission: RE | Disposition: A | Discharge status: Home / Self Care | Payer: Self-pay | Source: Home / Self Care | Attending: Ophthalmology

## 2023-01-07 ENCOUNTER — Other Ambulatory Visit: Payer: Self-pay

## 2023-01-07 ENCOUNTER — Ambulatory Visit: Payer: Medicare HMO | Admitting: Anesthesiology

## 2023-01-07 ENCOUNTER — Ambulatory Visit
Admission: RE | Admit: 2023-01-07 | Discharge: 2023-01-07 | Disposition: A | Discharge status: Home / Self Care | Payer: Medicare HMO | Attending: Ophthalmology | Admitting: Ophthalmology

## 2023-01-07 DIAGNOSIS — I129 Hypertensive chronic kidney disease with stage 1 through stage 4 chronic kidney disease, or unspecified chronic kidney disease: Secondary | ICD-10-CM | POA: Diagnosis not present

## 2023-01-07 DIAGNOSIS — N189 Chronic kidney disease, unspecified: Secondary | ICD-10-CM | POA: Insufficient documentation

## 2023-01-07 DIAGNOSIS — I2510 Atherosclerotic heart disease of native coronary artery without angina pectoris: Secondary | ICD-10-CM | POA: Insufficient documentation

## 2023-01-07 DIAGNOSIS — E1122 Type 2 diabetes mellitus with diabetic chronic kidney disease: Secondary | ICD-10-CM | POA: Diagnosis not present

## 2023-01-07 DIAGNOSIS — H2512 Age-related nuclear cataract, left eye: Secondary | ICD-10-CM | POA: Insufficient documentation

## 2023-01-07 DIAGNOSIS — E1136 Type 2 diabetes mellitus with diabetic cataract: Secondary | ICD-10-CM | POA: Diagnosis not present

## 2023-01-07 DIAGNOSIS — H2511 Age-related nuclear cataract, right eye: Secondary | ICD-10-CM | POA: Diagnosis not present

## 2023-01-07 HISTORY — PX: CATARACT EXTRACTION W/PHACO: SHX586

## 2023-01-07 LAB — GLUCOSE, CAPILLARY: Glucose-Capillary: 114 mg/dL — ABNORMAL HIGH (ref 70–99)

## 2023-01-07 SURGERY — PHACOEMULSIFICATION, CATARACT, WITH IOL INSERTION
Anesthesia: Monitor Anesthesia Care | Site: Eye | Laterality: Left

## 2023-01-07 MED ORDER — SIGHTPATH DOSE#1 BSS IO SOLN
INTRAOCULAR | Status: DC | PRN
  Administered 2023-01-07: 15 mL

## 2023-01-07 MED ORDER — MOXIFLOXACIN HCL 0.5 % OP SOLN
OPHTHALMIC | Status: DC | PRN
  Administered 2023-01-07: .2 mL via OPHTHALMIC

## 2023-01-07 MED ORDER — SIGHTPATH DOSE#1 NA HYALUR & NA CHOND-NA HYALUR IO KIT
PACK | INTRAOCULAR | Status: DC | PRN
  Administered 2023-01-07: 1 via OPHTHALMIC

## 2023-01-07 MED ORDER — TETRACAINE HCL 0.5 % OP SOLN
1.0000 [drp] | OPHTHALMIC | Status: DC | PRN
  Administered 2023-01-07 (×3): 1 [drp] via OPHTHALMIC

## 2023-01-07 MED ORDER — SIGHTPATH DOSE#1 BSS IO SOLN
INTRAOCULAR | Status: DC | PRN
  Administered 2023-01-07: 71 mL via OPHTHALMIC

## 2023-01-07 MED ORDER — LIDOCAINE HCL (PF) 2 % IJ SOLN
INTRAOCULAR | Status: DC | PRN
  Administered 2023-01-07: 1 mL via INTRAOCULAR

## 2023-01-07 MED ORDER — FENTANYL CITRATE (PF) 100 MCG/2ML IJ SOLN
INTRAMUSCULAR | Status: DC | PRN
  Administered 2023-01-07: 25 ug via INTRAVENOUS

## 2023-01-07 MED ORDER — ARMC OPHTHALMIC DILATING DROPS
1.0000 | OPHTHALMIC | Status: DC | PRN
  Administered 2023-01-07 (×3): 1 via OPHTHALMIC

## 2023-01-07 MED ORDER — MIDAZOLAM HCL 2 MG/2ML IJ SOLN
INTRAMUSCULAR | Status: DC | PRN
  Administered 2023-01-07: 1 mg via INTRAVENOUS

## 2023-01-07 MED ORDER — LACTATED RINGERS IV SOLN: INTRAVENOUS | Status: DC

## 2023-01-07 SURGICAL SUPPLY — 18 items
CANNULA ANT/CHMB 27G (MISCELLANEOUS) IMPLANT
CANNULA ANT/CHMB 27GA (MISCELLANEOUS) IMPLANT
CATARACT SUITE SIGHTPATH (MISCELLANEOUS) ×1 IMPLANT
DISSECTOR HYDRO NUCLEUS 50X22 (MISCELLANEOUS) ×1 IMPLANT
FEE CATARACT SUITE SIGHTPATH (MISCELLANEOUS) ×1 IMPLANT
GLOVE SURG GAMMEX PI TX LF 7.5 (GLOVE) ×1 IMPLANT
GLOVE SURG SYN 8.5  E (GLOVE) ×1
GLOVE SURG SYN 8.5 E (GLOVE) ×1 IMPLANT
GLOVE SURG SYN 8.5 PF PI (GLOVE) ×1 IMPLANT
LENS IOL TECNIS EYHANCE 22.0 (Intraocular Lens) IMPLANT
NDL FILTER BLUNT 18X1 1/2 (NEEDLE) ×1 IMPLANT
NEEDLE FILTER BLUNT 18X1 1/2 (NEEDLE) ×1 IMPLANT
PACK VIT ANT 23G (MISCELLANEOUS) IMPLANT
RING MALYGIN (MISCELLANEOUS) IMPLANT
SUT ETHILON 10-0 CS-B-6CS-B-6 (SUTURE)
SUTURE EHLN 10-0 CS-B-6CS-B-6 (SUTURE) IMPLANT
SYR 3ML LL SCALE MARK (SYRINGE) ×1 IMPLANT
SYR 5ML LL (SYRINGE) ×1 IMPLANT

## 2023-01-07 NOTE — Transfer of Care (Signed)
Immediate Anesthesia Transfer of Care Note  Patient: Ryan Kirby  Procedure(s) Performed: CATARACT EXTRACTION PHACO AND INTRAOCULAR LENS PLACEMENT (IOC) LEFT (Left: Eye)  Patient Location: PACU  Anesthesia Type: MAC  Level of Consciousness: awake, alert  and patient cooperative  Airway and Oxygen Therapy: Patient Spontanous Breathing and Patient connected to supplemental oxygen  Post-op Assessment: Post-op Vital signs reviewed, Patient's Cardiovascular Status Stable, Respiratory Function Stable, Patent Airway and No signs of Nausea or vomiting  Post-op Vital Signs: Reviewed and stable  Complications: No notable events documented.

## 2023-01-07 NOTE — Anesthesia Preprocedure Evaluation (Addendum)
Anesthesia Evaluation  Patient identified by MRN, date of birth, ID band Patient awake    Reviewed: Allergy & Precautions, H&P , NPO status , Patient's Chart, lab work & pertinent test results  Airway Mallampati: III  TM Distance: >3 FB Neck ROM: Full    Dental no notable dental hx.    Pulmonary neg pulmonary ROS   Pulmonary exam normal breath sounds clear to auscultation       Cardiovascular hypertension, + CAD, + CABG and + Peripheral Vascular Disease  Normal cardiovascular exam+ Valvular Problems/Murmurs  Rhythm:Regular Rate:Normal  09-24-22 EF 55-60%, mild LVH, mild LA dilation, Grade I diastolic dysfunction   Neuro/Psych negative neurological ROS  negative psych ROS   GI/Hepatic Neg liver ROS,GERD  ,,  Endo/Other  diabetes    Renal/GU Renal disease  negative genitourinary   Musculoskeletal  (+) Arthritis ,    Abdominal   Peds negative pediatric ROS (+)  Hematology negative hematology ROS (+)   Anesthesia Other Findings Allergy  CAD (coronary artery disease) Hyperlipidemia  Diabetes mellitus without complication (HCC) Chronic kidney disease  GERD (gastroesophageal reflux disease) Hypertension  Arthritis Acute metabolic encephalopathy  AKI (acute kidney injury) (HCC) Hypercalcemia  Hypertensive urgency Hypokalemia  Lactic acidosis Sinus bradycardia     Reproductive/Obstetrics negative OB ROS                             Anesthesia Physical Anesthesia Plan  ASA: 3  Anesthesia Plan: MAC   Post-op Pain Management:    Induction: Intravenous  PONV Risk Score and Plan:   Airway Management Planned: Natural Airway and Nasal Cannula  Additional Equipment:   Intra-op Plan:   Post-operative Plan:   Informed Consent: I have reviewed the patients History and Physical, chart, labs and discussed the procedure including the risks, benefits and alternatives for the proposed  anesthesia with the patient or authorized representative who has indicated his/her understanding and acceptance.     Dental Advisory Given  Plan Discussed with: Anesthesiologist, CRNA and Surgeon  Anesthesia Plan Comments: (Patient consented for risks of anesthesia including but not limited to:  - adverse reactions to medications - damage to eyes, teeth, lips or other oral mucosa - nerve damage due to positioning  - sore throat or hoarseness - Damage to heart, brain, nerves, lungs, other parts of body or loss of life  Patient voiced understanding.)       Anesthesia Quick Evaluation

## 2023-01-07 NOTE — Op Note (Signed)
OPERATIVE NOTE  DETRIC SHAHEEN [id number] 01/07/2023   PREOPERATIVE DIAGNOSIS:  Nuclear sclerotic cataract left eye.  H25.12   POSTOPERATIVE DIAGNOSIS:    Nuclear sclerotic cataract left eye.     PROCEDURE:  Phacoemusification with posterior chamber intraocular lens placement of the left eye   LENS:   Implant Name Type Inv. Item Serial No. Manufacturer Lot No. LRB No. Used Action  LENS IOL TECNIS EYHANCE 22.0 - [id number] Intraocular Lens LENS IOL TECNIS EYHANCE 22.0 [id number] [employer]  Left 1 Implanted      Procedure(s) with comments: CATARACT EXTRACTION PHACO AND INTRAOCULAR LENS PLACEMENT (IOC) LEFT (Left) - 5.63 0:36.7  DIB00 +22.0   ULTRASOUND TIME: 0 minutes 36 seconds.  CDE 5.63   SURGEON:  Willey Blade, MD, MPH   ANESTHESIA:  Topical with tetracaine drops augmented with 1% preservative-free intracameral lidocaine.  ESTIMATED BLOOD LOSS: <1 mL   COMPLICATIONS:  None.   DESCRIPTION OF PROCEDURE:  The patient was identified in the holding room and transported to the operating room and placed in the supine position under the operating microscope.  The left eye was identified as the operative eye and it was prepped and draped in the usual sterile ophthalmic fashion.   A 1.0 millimeter clear-corneal paracentesis was made at the 5:00 position. 0.5 ml of preservative-free 1% lidocaine with epinephrine was injected into the anterior chamber.  The anterior chamber was filled with viscoelastic.  A 2.4 millimeter keratome was used to make a near-clear corneal incision at the 2:00 position.  A curvilinear capsulorrhexis was made with a cystotome and capsulorrhexis forceps.  Balanced salt solution was used to hydrodissect and hydrodelineate the nucleus.   Phacoemulsification was then used in stop and chop fashion to remove the lens nucleus and epinucleus.  The remaining cortex was then removed using the irrigation and aspiration handpiece. Viscoelastic was then placed into the  capsular bag to distend it for lens placement.  A lens was then injected into the capsular bag.  The remaining viscoelastic was aspirated.   Wounds were hydrated with balanced salt solution.  The anterior chamber was inflated to a physiologic pressure with balanced salt solution.  Intracameral vigamox 0.1 mL undiltued was injected into the eye and a drop placed onto the ocular surface.  No wound leaks were noted.  The patient was taken to the recovery room in stable condition without complications of anesthesia or surgery  Willey Blade 01/07/2023, 9:42 AM

## 2023-01-07 NOTE — H&P (Signed)
[facility]   Primary Care Physician:  Larae Grooms, NP Ophthalmologist: Dr. Willey Blade  Pre-Procedure History & Physical: HPI:  Ryan Kirby is a 82 y.o. male here for cataract surgery.   Past Medical History:  Diagnosis Date   Acute metabolic encephalopathy 09/23/2022   AKI (acute kidney injury) (HCC) 09/23/2022   Allergy    Arthritis    CAD (coronary artery disease)    Chronic kidney disease    pt is not on dialysis   Diabetes mellitus without complication (HCC)    GERD (gastroesophageal reflux disease)    Hypercalcemia 09/23/2022   Hyperlipidemia    Hypertension    Hypertensive urgency 09/23/2022   Hypokalemia 08/18/2020   Lactic acidosis 09/23/2022   Sinus bradycardia 09/24/2022    Past Surgical History:  Procedure Laterality Date   BOWEL RESECTION  2004   CATARACT EXTRACTION W/PHACO Right 12/24/2022   Procedure: CATARACT EXTRACTION PHACO AND INTRAOCULAR LENS PLACEMENT (IOC) RIGHT;  Surgeon: Nevada Crane, MD;  Location: [facility];  Service: Ophthalmology;  Laterality: Right;  6.06 0:37.7   CORONARY ARTERY BYPASS GRAFT     FINGER AMPUTATION Left 2011   5th   INGUINAL HERNIA REPAIR Right 06/01/2020   Procedure: HERNIA REPAIR INGUINAL ADULT, open;  Surgeon: Duanne Guess, MD;  Location: [facility];  Service: General;  Laterality: Right;   KNEE ARTHROSCOPY Right 09/12/2015   Procedure: ARTHROSCOPY KNEE, LATERAL MENISECTOMY, CHONDROPLASTY;  Surgeon: Donato Heinz, MD;  Location: [facility];  Service: Orthopedics;  Laterality: Right;   WISDOM TOOTH EXTRACTION      Prior to Admission medications   Medication Sig Start Date End Date Taking? Authorizing Provider  acetaminophen (TYLENOL) 500 MG tablet Take 500-1,000 mg by mouth every 6 (six) hours as needed for mild pain or moderate pain.   Yes [provider]  amLODipine (NORVASC) 10 MG tablet Take 1 tablet (10 mg total) by mouth daily. 11/01/22  Yes Larae Grooms, NP  aspirin EC 81  MG tablet Take 1 tablet (81 mg total) by mouth daily. Swallow whole. 09/29/22  Yes Esaw Grandchild A, DO  docusate sodium (COLACE) 100 MG capsule Take 100 mg by mouth daily as needed for mild constipation.   Yes [provider]  doxazosin (CARDURA) 2 MG tablet Take 1 tablet (2 mg total) by mouth every morning. 11/01/22  Yes Larae Grooms, NP  furosemide (LASIX) 40 MG tablet Take 1 tablet (40 mg total) by mouth daily. 11/01/22  Yes Larae Grooms, NP  Magnesium Oxide -Mg Supplement (MAG-OXIDE) 200 MG TABS Take 1 tablet (200 mg total) by mouth daily. 11/01/22  Yes Larae Grooms, NP  metFORMIN (GLUCOPHAGE) 500 MG tablet Take 2 tablets (1,000 mg total) by mouth 2 (two) times daily. 11/01/22  Yes Larae Grooms, NP  omeprazole (PRILOSEC OTC) 20 MG tablet Take 1 tablet (20 mg total) by mouth daily as needed (GERD symptoms). 11/01/22  Yes Larae Grooms, NP  potassium chloride SA (KLOR-CON M) 20 MEQ tablet Take 1 tablet (20 mEq total) by mouth daily. 11/01/22  Yes Larae Grooms, NP  pravastatin (PRAVACHOL) 40 MG tablet Take 1 tablet (40 mg total) by mouth at bedtime. 11/01/22  Yes Larae Grooms, NP  Blood Glucose Monitoring Suppl DEVI 1 each by Does not apply route in the morning, at noon, and at bedtime. May substitute to any manufacturer covered by patient's insurance. 09/28/22   Pennie Banter, DO    Allergies as of 12/03/2022   (No Known Allergies)  Family History  Problem Relation Age of Onset   Arthritis Mother    Hypertension Mother    Breast cancer Mother    Arthritis Father    Hypertension Father    Colon cancer Father     Social History   Socioeconomic History   Marital status: Single    Spouse name: Not on file   Number of children: Not on file   Years of education: Not on file   Highest education level: 12th grade  Occupational History   Occupation: retired  Tobacco Use   Smoking status: Never   Smokeless tobacco: Never  Vaping Use   Vaping  Use: Never used  Substance and Sexual Activity   Alcohol use: No    Alcohol/week: 0.0 standard drinks of alcohol   Drug use: No   Sexual activity: Not Currently  Other Topics Concern   Not on file  Social History Narrative   Not on file   Social Determinants of Health   Financial Resource Strain: Low Risk  (04/18/2022)   Overall Financial Resource Strain (CARDIA)    Difficulty of Paying Living Expenses: Not hard at all  Food Insecurity: No Food Insecurity (10/01/2022)   Hunger Vital Sign    Worried About Running Out of Food in the Last Year: Never true    Ran Out of Food in the Last Year: Never true  Transportation Needs: No Transportation Needs (10/01/2022)   PRAPARE - Administrator, Civil Service (Medical): No    Lack of Transportation (Non-Medical): No  Physical Activity: Inactive (04/18/2022)   Exercise Vital Sign    Days of Exercise per Week: 0 days    Minutes of Exercise per Session: 0 min  Stress: No Stress Concern Present (04/18/2022)   [employer] of Occupational Health - Occupational Stress Questionnaire    Feeling of Stress : Not at all  Social Connections: Unknown (04/18/2022)   Social Connection and Isolation Panel [NHANES]    Frequency of Communication with Friends and Family: Twice a week    Frequency of Social Gatherings with Friends and Family: Once a week    Attends Religious Services: More than 4 times per year    Active Member of [employer] or Organizations: No    Attends Banker Meetings: Never    Marital Status: Not on file  Intimate Partner Violence: Not At Risk (09/26/2022)   Humiliation, Afraid, Rape, and Kick questionnaire    Fear of Current or Ex-Partner: No    Emotionally Abused: No    Physically Abused: No    Sexually Abused: No    Review of Systems: See HPI, otherwise negative ROS  Physical Exam: BP (!) 149/84   Pulse (!) 106   Temp 98.7 F (37.1 C) (Temporal)   Resp 12   Ht 5\' 10"  (1.778 m)   Wt 73.4 kg   SpO2  98%   BMI 23.22 kg/m  General:   Alert, cooperative in NAD Head:  Normocephalic and atraumatic. Respiratory:  Normal work of breathing. Cardiovascular:  RRR  Impression/Plan: Ryan Kirby is here for cataract surgery.  Risks, benefits, limitations, and alternatives regarding cataract surgery have been reviewed with the patient.  Questions have been answered.  All parties agreeable.   Willey Blade, MD  01/07/2023, 9:10 AM

## 2023-01-07 NOTE — Anesthesia Postprocedure Evaluation (Signed)
Anesthesia Post Note  Patient: Ryan Kirby  Procedure(s) Performed: CATARACT EXTRACTION PHACO AND INTRAOCULAR LENS PLACEMENT (IOC) LEFT (Left: Eye)  Patient location during evaluation: PACU Anesthesia Type: MAC Level of consciousness: awake and alert Pain management: pain level controlled Vital Signs Assessment: post-procedure vital signs reviewed and stable Respiratory status: spontaneous breathing, nonlabored ventilation, respiratory function stable and patient connected to nasal cannula oxygen Cardiovascular status: stable and blood pressure returned to baseline Postop Assessment: no apparent nausea or vomiting Anesthetic complications: no   No notable events documented.   Last Vitals:  Vitals:   01/07/23 0945 01/07/23 0949  BP: (!) 152/87 (!) 157/90  Pulse: (!) 102 99  Resp: 20 13  Temp: 36.6 C   SpO2: 98% 98%    Last Pain:  Vitals:   01/07/23 0949  TempSrc:   PainSc: 0-No pain                 Marisue Humble

## 2023-01-08 ENCOUNTER — Encounter: Payer: Self-pay | Admitting: Ophthalmology

## 2023-01-18 ENCOUNTER — Telehealth: Payer: Self-pay

## 2023-01-18 NOTE — Progress Notes (Cosign Needed)
I have attempted to reach out to the patient to reschedule his appointment that was missed on 12/24/22 with the clinical pharmacist Artelia Laroche. I have left a voicemail to return call when available.  Rance Muir, RMA

## 2023-01-21 ENCOUNTER — Other Ambulatory Visit: Payer: Self-pay | Admitting: Nurse Practitioner

## 2023-01-21 DIAGNOSIS — E1165 Type 2 diabetes mellitus with hyperglycemia: Secondary | ICD-10-CM

## 2023-01-22 NOTE — Telephone Encounter (Signed)
Requested Prescriptions  Pending Prescriptions Disp Refills   metFORMIN (GLUCOPHAGE) 500 MG tablet [Pharmacy Med Name: METFORMIN HYDROCHLORIDE 500 MG Tablet] 360 tablet 1    Sig: TAKE 2 TABLETS TWICE DAILY     Endocrinology:  Diabetes - Biguanides Passed - 01/21/2023  8:51 AM      Passed - Cr in normal range and within 360 days    Creatinine, Ser  Date Value Ref Range Status  12/05/2022 1.11 0.76 - 1.27 mg/dL Final         Passed - HBA1C is between 0 and 7.9 and within 180 days    HB A1C (BAYER DCA - WAIVED)  Date Value Ref Range Status  09/27/2021 7.4 (H) 4.8 - 5.6 % Final    Comment:             Prediabetes: 5.7 - 6.4          Diabetes: >6.4          Glycemic control for adults with diabetes: <7.0    Hgb A1c MFr Bld  Date Value Ref Range Status  12/05/2022 6.4 (H) 4.8 - 5.6 % Final    Comment:             Prediabetes: 5.7 - 6.4          Diabetes: >6.4          Glycemic control for adults with diabetes: <7.0          Passed - eGFR in normal range and within 360 days    GFR calc Af Amer  Date Value Ref Range Status  08/18/2020 62 >59 mL/min/1.73 Final    Comment:    **In accordance with recommendations from the NKF-ASN Task force,**   [facility] is in the process of updating its eGFR calculation to the   2021 CKD-EPI creatinine equation that estimates kidney function   without a race variable.    GFR, Estimated  Date Value Ref Range Status  09/28/2022 >60 >60 mL/min Final    Comment:    (NOTE) Calculated using the CKD-EPI Creatinine Equation (2021)    eGFR  Date Value Ref Range Status  12/05/2022 67 >59 mL/min/1.73 Final         Passed - B12 Level in normal range and within 720 days    Vitamin B-12  Date Value Ref Range Status  09/27/2021 263 232 - 1,245 pg/mL Final         Passed - Valid encounter within last 6 months    Recent Outpatient Visits           1 month ago Hypertension associated with diabetes [facility])   [place] [facility]  Larae Grooms, NP   2 months ago Type 2 diabetes mellitus with hyperglycemia, without long-term current use of insulin (HCC)   [facility] Larae Grooms, NP   3 months ago Hospital discharge follow-up   [place] [facility] Larae Grooms, NP   4 months ago Left hip pain   [place] [facility] Larae Grooms, NP   6 months ago Annual physical exam   [place] [facility] Larae Grooms, NP       Future Appointments             In 3 weeks Larae Grooms, NP  [facility]            Passed - CBC within normal limits and completed in the last  12 months    WBC  Date Value Ref Range Status  10/10/2022 5.2 3.4 - 10.8 x10E3/uL Final  09/26/2022 6.0 4.0 - 10.5 K/uL Final   RBC  Date Value Ref Range Status  10/10/2022 4.12 (L) 4.14 - 5.80 x10E6/uL Final  09/26/2022 4.15 (L) 4.22 - 5.81 MIL/uL Final   Hemoglobin  Date Value Ref Range Status  10/10/2022 12.3 (L) 13.0 - 17.7 g/dL Final   Hematocrit  Date Value Ref Range Status  10/10/2022 37.6 37.5 - 51.0 % Final   MCHC  Date Value Ref Range Status  10/10/2022 32.7 31.5 - 35.7 g/dL Final  16/06/9603 54.0 30.0 - 36.0 g/dL Final   [facility]  Date Value Ref Range Status  10/10/2022 29.9 26.6 - 33.0 pg Final  09/26/2022 30.6 26.0 - 34.0 pg Final   MCV  Date Value Ref Range Status  10/10/2022 91 79 - 97 fL Final   No results found for: "PLTCOUNTKUC", "LABPLAT", "POCPLA" RDW  Date Value Ref Range Status  10/10/2022 13.1 11.6 - 15.4 % Final

## 2023-02-13 ENCOUNTER — Encounter: Payer: Self-pay | Admitting: Nurse Practitioner

## 2023-02-13 ENCOUNTER — Ambulatory Visit (INDEPENDENT_AMBULATORY_CARE_PROVIDER_SITE_OTHER): Payer: Medicare HMO | Admitting: Nurse Practitioner

## 2023-02-13 VITALS — BP 138/68 | HR 94 | Temp 98.1°F | Wt 166.6 lb

## 2023-02-13 DIAGNOSIS — I2510 Atherosclerotic heart disease of native coronary artery without angina pectoris: Secondary | ICD-10-CM

## 2023-02-13 DIAGNOSIS — E1159 Type 2 diabetes mellitus with other circulatory complications: Secondary | ICD-10-CM | POA: Diagnosis not present

## 2023-02-13 DIAGNOSIS — D229 Melanocytic nevi, unspecified: Secondary | ICD-10-CM

## 2023-02-13 DIAGNOSIS — I152 Hypertension secondary to endocrine disorders: Secondary | ICD-10-CM

## 2023-02-13 DIAGNOSIS — E1169 Type 2 diabetes mellitus with other specified complication: Secondary | ICD-10-CM | POA: Diagnosis not present

## 2023-02-13 DIAGNOSIS — I2583 Coronary atherosclerosis due to lipid rich plaque: Secondary | ICD-10-CM

## 2023-02-13 DIAGNOSIS — E785 Hyperlipidemia, unspecified: Secondary | ICD-10-CM

## 2023-02-13 DIAGNOSIS — E1165 Type 2 diabetes mellitus with hyperglycemia: Secondary | ICD-10-CM | POA: Diagnosis not present

## 2023-02-13 DIAGNOSIS — Z7984 Long term (current) use of oral hypoglycemic drugs: Secondary | ICD-10-CM

## 2023-02-13 DIAGNOSIS — I739 Peripheral vascular disease, unspecified: Secondary | ICD-10-CM

## 2023-02-13 NOTE — Assessment & Plan Note (Signed)
Chronic.  Controlled.  Continue with current medication regimen of Amlodipine, benzepril and Furosemide.  Patient on Furosemide 40mg  without diagnosis of CHF and euvolemic.  Return to clinic in 3 months for reevaluation.  Labs ordered today.  Call sooner if concerns arise.

## 2023-02-13 NOTE — Assessment & Plan Note (Signed)
Chronic.  Controlled.  Continue with current medication regimen on Pravastatin 40mg daily.  Follow up in 3 months. Call sooner if concerns arise.  

## 2023-02-13 NOTE — Assessment & Plan Note (Signed)
Chronic. Stable.  No chest pain at visit today.  Continue to follow up with Cardiology.

## 2023-02-13 NOTE — Assessment & Plan Note (Signed)
Chronic.  Controlled.  Continue with current medication regimen.  Labs ordered today.  Return to clinic in 6 months for reevaluation.  Call sooner if concerns arise.  ? ?

## 2023-02-13 NOTE — Progress Notes (Signed)
BP 138/68   Pulse 94   Temp 98.1 F (36.7 C) (Oral)   Wt 166 lb 9.6 oz (75.6 kg)   SpO2 98%   BMI 23.90 kg/m    Subjective:    Patient ID: Ryan Kirby, male    DOB: 11-Oct-1940, 82 y.o.   MRN: [id number]  HPI: Ryan Kirby is a 82 y.o. male  Chief Complaint  Patient presents with   Hyperlipidemia   Hypertension   Depression   HYPERTENSION / HYPERLIPIDEMIA Satisfied with current treatment? no Duration of hypertension: years BP monitoring frequency: weekly BP range: 130/80 BP medication side effects: no Past BP meds: amlodipine and benazepril/HCTZ Duration of hyperlipidemia: years Cholesterol medication side effects: no Cholesterol supplements: none Past cholesterol medications: pravastatin (pravachol) Medication compliance: excellent compliance Aspirin: no Recent stressors: no Recurrent headaches: no Visual changes: no Palpitations: no Dyspnea: no Chest pain: no Lower extremity edema: no Dizzy/lightheaded: no  DIABETES Hypoglycemic episodes:no Polydipsia/polyuria: no Visual disturbance: no Chest pain: no Paresthesias: no Glucose Monitoring: no  Accucheck frequency: Not Checking  Fasting glucose:  Post prandial: 140-160  Evening:  Before meals: Taking Insulin?: no  Long acting insulin:  Short acting insulin: Blood Pressure Monitoring: not checking Retinal Examination: Up to Date- has his eye exam scheduled for the end of the month Foot Exam: Up to Date Diabetic Education: Not Completed Pneumovax: Up to Date Influenza: Up to Date Aspirin: yes     Relevant past medical, surgical, family and social history reviewed and updated as indicated. Interim medical history since our last visit reviewed. Allergies and medications reviewed and updated.  Review of Systems  Eyes:  Negative for visual disturbance.  Respiratory:  Negative for chest tightness and shortness of breath.   Cardiovascular:  Negative for chest pain, palpitations and leg swelling.   Endocrine: Negative for polydipsia and polyuria.  Skin:        Sore on face  Neurological:  Negative for dizziness, light-headedness, numbness and headaches.    Per HPI unless specifically indicated above     Objective:    BP 138/68   Pulse 94   Temp 98.1 F (36.7 C) (Oral)   Wt 166 lb 9.6 oz (75.6 kg)   SpO2 98%   BMI 23.90 kg/m   Wt Readings from Last 3 Encounters:  02/13/23 166 lb 9.6 oz (75.6 kg)  01/07/23 161 lb 12.8 oz (73.4 kg)  12/24/22 164 lb (74.4 kg)    Physical Exam Vitals and nursing note reviewed.  Constitutional:      General: He is not in acute distress.    Appearance: Normal appearance. He is not ill-appearing, toxic-appearing or diaphoretic.  HENT:     Head: Normocephalic.     Right Ear: External ear normal.     Left Ear: External ear normal.     Nose: Nose normal. No congestion or rhinorrhea.     Mouth/Throat:     Mouth: Mucous membranes are moist.  Eyes:     General:        Right eye: No discharge.        Left eye: No discharge.     Extraocular Movements: Extraocular movements intact.     Conjunctiva/sclera: Conjunctivae normal.     Pupils: Pupils are equal, round, and reactive to light.  Cardiovascular:     Rate and Rhythm: Normal rate and regular rhythm.     Heart sounds: No murmur heard. Pulmonary:     Effort: Pulmonary effort is normal. No respiratory  distress.     Breath sounds: Normal breath sounds. No wheezing, rhonchi or rales.  Abdominal:     General: Abdomen is flat. Bowel sounds are normal.  Musculoskeletal:     Cervical back: Normal range of motion and neck supple.  Skin:    General: Skin is warm and dry.     Capillary Refill: Capillary refill takes less than 2 seconds.  Neurological:     General: No focal deficit present.     Mental Status: He is alert and oriented to person, place, and time.  Psychiatric:        Mood and Affect: Mood normal.        Behavior: Behavior normal.        Thought Content: Thought content  normal.        Judgment: Judgment normal.     Results for orders placed or performed during the hospital encounter of 01/07/23  Glucose, capillary  Result Value Ref Range   Glucose-Capillary 114 (H) 70 - 99 mg/dL      Assessment & Plan:   Problem List Items Addressed This Visit       Cardiovascular and Mediastinum   Hypertension associated with diabetes (HCC) - Primary    Chronic.  Controlled.  Continue with current medication regimen of Amlodipine, benzepril and Furosemide.  Patient on Furosemide 40mg  without diagnosis of CHF and euvolemic.  Return to clinic in 3 months for reevaluation.  Labs ordered today.  Call sooner if concerns arise.       Relevant Orders   Comp Met (CMET)   CAD (coronary artery disease)    Chronic. Stable.  No chest pain at visit today.  Continue to follow up with Cardiology.      PVD (peripheral vascular disease) (HCC)    Chronic.  Controlled.  Continue with current medication regimen.  Labs ordered today.  Return to clinic in 6 months for reevaluation.  Call sooner if concerns arise.          Endocrine   Hyperlipidemia associated with type 2 diabetes mellitus (HCC)    Chronic.  Controlled.  Continue with current medication regimen on Pravastatin 40mg  daily.  Follow up in 3 months. Call sooner if concerns arise.       Relevant Orders   Lipid Profile   Diabetes mellitus with hyperglycemia (HCC)    Chronic. Improved.  Sugars have been running 140-160s in the evening without insulin.  No longer on insulin. If A1c remains elevated at next visit will add SGLT2.  Follow up in 3 month.  Call sooner if concerns arise.       Relevant Orders   HgB A1c   Other Visit Diagnoses     Atypical mole       Relevant Orders   Ambulatory referral to Dermatology        Follow up plan: Return in about 4 months (around 06/15/2023) for HTN, HLD, DM2 FU.

## 2023-02-13 NOTE — Assessment & Plan Note (Signed)
Chronic. Improved.  Sugars have been running 140-160s in the evening without insulin.  No longer on insulin. If A1c remains elevated at next visit will add SGLT2.  Follow up in 3 month.  Call sooner if concerns arise.

## 2023-02-14 LAB — COMPREHENSIVE METABOLIC PANEL
ALT: 8 IU/L (ref 0–44)
AST: 19 IU/L (ref 0–40)
Albumin/Globulin Ratio: 1.9
Albumin: 4.1 g/dL (ref 3.7–4.7)
Alkaline Phosphatase: 106 IU/L (ref 44–121)
BUN/Creatinine Ratio: 15 (ref 10–24)
BUN: 23 mg/dL (ref 8–27)
Bilirubin Total: 0.3 mg/dL (ref 0.0–1.2)
CO2: 24 mmol/L (ref 20–29)
Calcium: 9.8 mg/dL (ref 8.6–10.2)
Chloride: 101 mmol/L (ref 96–106)
Creatinine, Ser: 1.55 mg/dL — ABNORMAL HIGH (ref 0.76–1.27)
Globulin, Total: 2.2 g/dL (ref 1.5–4.5)
Glucose: 194 mg/dL — ABNORMAL HIGH (ref 70–99)
Potassium: 3.9 mmol/L (ref 3.5–5.2)
Sodium: 140 mmol/L (ref 134–144)
Total Protein: 6.3 g/dL (ref 6.0–8.5)
eGFR: 45 mL/min/{1.73_m2} — ABNORMAL LOW (ref >59)

## 2023-02-14 LAB — LIPID PANEL
Chol/HDL Ratio: 2.3 ratio (ref 0.0–5.0)
Cholesterol, Total: 120 mg/dL (ref 100–199)
HDL: 52 mg/dL (ref >39)
LDL Chol Calc (NIH): 49 mg/dL (ref 0–99)
Triglycerides: 104 mg/dL (ref 0–149)
VLDL Cholesterol Cal: 19 mg/dL (ref 5–40)

## 2023-02-14 LAB — HEMOGLOBIN A1C
Est. average glucose Bld gHb Est-mCnc: 128 mg/dL
Hgb A1c MFr Bld: 6.1 % — ABNORMAL HIGH (ref 4.8–5.6)

## 2023-02-14 NOTE — Progress Notes (Signed)
Please call patient and let him know that his A1c is well controlled at 6.1%.  He does not need any insulin still.  I would like him to increase his eater intake and return in 1 month to recheck his kidney function as a lab visit.  Otherwise, no concerns at this time.  Continue with current medication regimen.  Follow up as discussed.

## 2023-02-14 NOTE — Progress Notes (Signed)
Okay to continue it at this time.

## 2023-02-14 NOTE — Addendum Note (Signed)
Addended by: Larae Grooms on: 02/14/2023 09:16 AM   Modules accepted: Orders

## 2023-03-05 DIAGNOSIS — M25551 Pain in right hip: Secondary | ICD-10-CM | POA: Diagnosis not present

## 2023-03-05 DIAGNOSIS — M7061 Trochanteric bursitis, right hip: Secondary | ICD-10-CM | POA: Diagnosis not present

## 2023-03-19 ENCOUNTER — Other Ambulatory Visit: Payer: Medicare HMO

## 2023-03-19 DIAGNOSIS — E1159 Type 2 diabetes mellitus with other circulatory complications: Secondary | ICD-10-CM | POA: Diagnosis not present

## 2023-03-19 DIAGNOSIS — I152 Hypertension secondary to endocrine disorders: Secondary | ICD-10-CM | POA: Diagnosis not present

## 2023-03-20 ENCOUNTER — Other Ambulatory Visit: Payer: Self-pay | Admitting: Family Medicine

## 2023-03-20 DIAGNOSIS — N289 Disorder of kidney and ureter, unspecified: Secondary | ICD-10-CM

## 2023-03-20 LAB — COMPREHENSIVE METABOLIC PANEL
ALT: 8 IU/L (ref 0–44)
AST: 18 IU/L (ref 0–40)
Albumin: 4.2 g/dL (ref 3.7–4.7)
Alkaline Phosphatase: 102 IU/L (ref 44–121)
BUN/Creatinine Ratio: 11 (ref 10–24)
BUN: 16 mg/dL (ref 8–27)
Bilirubin Total: 0.3 mg/dL (ref 0.0–1.2)
CO2: 21 mmol/L (ref 20–29)
Calcium: 10.1 mg/dL (ref 8.6–10.2)
Chloride: 100 mmol/L (ref 96–106)
Creatinine, Ser: 1.49 mg/dL — ABNORMAL HIGH (ref 0.76–1.27)
Globulin, Total: 2.3 g/dL (ref 1.5–4.5)
Glucose: 155 mg/dL — ABNORMAL HIGH (ref 70–99)
Potassium: 3.9 mmol/L (ref 3.5–5.2)
Sodium: 142 mmol/L (ref 134–144)
Total Protein: 6.5 g/dL (ref 6.0–8.5)
eGFR: 47 mL/min/{1.73_m2} — ABNORMAL LOW (ref >59)

## 2023-04-08 DIAGNOSIS — D225 Melanocytic nevi of trunk: Secondary | ICD-10-CM | POA: Diagnosis not present

## 2023-04-08 DIAGNOSIS — L821 Other seborrheic keratosis: Secondary | ICD-10-CM | POA: Diagnosis not present

## 2023-04-08 DIAGNOSIS — I872 Venous insufficiency (chronic) (peripheral): Secondary | ICD-10-CM | POA: Diagnosis not present

## 2023-04-08 DIAGNOSIS — C44219 Basal cell carcinoma of skin of left ear and external auricular canal: Secondary | ICD-10-CM | POA: Diagnosis not present

## 2023-04-08 DIAGNOSIS — L738 Other specified follicular disorders: Secondary | ICD-10-CM | POA: Diagnosis not present

## 2023-04-08 DIAGNOSIS — C44311 Basal cell carcinoma of skin of nose: Secondary | ICD-10-CM | POA: Diagnosis not present

## 2023-04-08 DIAGNOSIS — D485 Neoplasm of uncertain behavior of skin: Secondary | ICD-10-CM | POA: Diagnosis not present

## 2023-04-08 DIAGNOSIS — L570 Actinic keratosis: Secondary | ICD-10-CM | POA: Diagnosis not present

## 2023-04-08 DIAGNOSIS — C44319 Basal cell carcinoma of skin of other parts of face: Secondary | ICD-10-CM | POA: Diagnosis not present

## 2023-04-08 DIAGNOSIS — C44612 Basal cell carcinoma of skin of right upper limb, including shoulder: Secondary | ICD-10-CM | POA: Diagnosis not present

## 2023-04-15 DIAGNOSIS — C44612 Basal cell carcinoma of skin of right upper limb, including shoulder: Secondary | ICD-10-CM | POA: Diagnosis not present

## 2023-04-16 ENCOUNTER — Other Ambulatory Visit: Payer: Medicare HMO

## 2023-04-16 DIAGNOSIS — N289 Disorder of kidney and ureter, unspecified: Secondary | ICD-10-CM | POA: Diagnosis not present

## 2023-04-19 ENCOUNTER — Other Ambulatory Visit: Payer: Self-pay | Admitting: Family Medicine

## 2023-04-19 DIAGNOSIS — N1831 Chronic kidney disease, stage 3a: Secondary | ICD-10-CM

## 2023-04-23 ENCOUNTER — Other Ambulatory Visit: Payer: Self-pay | Admitting: Nurse Practitioner

## 2023-04-23 ENCOUNTER — Ambulatory Visit (INDEPENDENT_AMBULATORY_CARE_PROVIDER_SITE_OTHER): Payer: Medicare HMO | Admitting: Emergency Medicine

## 2023-04-23 VITALS — Ht 70.0 in | Wt 165.0 lb

## 2023-04-23 DIAGNOSIS — E1165 Type 2 diabetes mellitus with hyperglycemia: Secondary | ICD-10-CM

## 2023-04-23 DIAGNOSIS — Z0000 Encounter for general adult medical examination without abnormal findings: Secondary | ICD-10-CM | POA: Diagnosis not present

## 2023-04-23 NOTE — Progress Notes (Signed)
Subjective:   Ryan Kirby is a 82 y.o. male who presents for Medicare Annual/Subsequent preventive examination.  Visit Complete: Virtual  I connected with  Ryan Kirby on 04/23/23 by a audio enabled telemedicine application and verified that I am speaking with the correct person using two identifiers.  Patient Location: Home  Provider Location: Home Office  I discussed the limitations of evaluation and management by telemedicine. The patient expressed understanding and agreed to proceed.  Vital Signs: Because this visit was a virtual/telehealth visit, some criteria may be missing or patient reported. Any vitals not documented were not able to be obtained and vitals that have been documented are patient reported.     Review of Systems     Cardiac Risk Factors include: advanced age (>34men, >52 women);male gender;diabetes mellitus;hypertension;dyslipidemia;Other (see comment), Risk factor comments: CAD     Objective:    Today's Vitals   04/23/23 0814  Weight: 165 lb (74.8 kg)  Height: 5\' 10"  (1.778 m)   Body mass index is 23.68 kg/m.     04/23/2023    8:30 AM 01/07/2023    8:12 AM 12/24/2022    9:15 AM 09/26/2022   12:48 AM 09/23/2022   12:33 PM 04/18/2022    8:32 AM 04/17/2021    8:18 AM  Advanced Directives  Does Patient Have a Medical Advance Directive? Yes Yes No  No No No  Type of Estate agent of [place];Living will Living will       Does patient want to make changes to medical advance directive? No - Patient declined        Copy of Healthcare Power of Attorney in Chart? No - copy requested        Would patient like information on creating a medical advance directive?   No - Patient declined No - Patient declined  No - Patient declined     Current Medications (verified) Outpatient Encounter Medications as of 04/23/2023  Medication Sig   acetaminophen (TYLENOL) 500 MG tablet Take 500-1,000 mg by mouth every 6 (six) hours as needed for mild  pain or moderate pain.   amLODipine (NORVASC) 10 MG tablet Take 1 tablet (10 mg total) by mouth daily.   aspirin EC 81 MG tablet Take 1 tablet (81 mg total) by mouth daily. Swallow whole.   Blood Glucose Monitoring Suppl DEVI 1 each by Does not apply route in the morning, at noon, and at bedtime. May substitute to any manufacturer covered by patient's insurance.   docusate sodium (COLACE) 100 MG capsule Take 100 mg by mouth daily as needed for mild constipation.   doxazosin (CARDURA) 2 MG tablet Take 1 tablet (2 mg total) by mouth every morning.   furosemide (LASIX) 40 MG tablet Take 1 tablet (40 mg total) by mouth daily.   Magnesium Oxide -Mg Supplement (MAG-OXIDE) 200 MG TABS Take 1 tablet (200 mg total) by mouth daily.   metFORMIN (GLUCOPHAGE) 500 MG tablet TAKE 2 TABLETS TWICE DAILY   omeprazole (PRILOSEC OTC) 20 MG tablet Take 1 tablet (20 mg total) by mouth daily as needed (GERD symptoms).   potassium chloride SA (KLOR-CON M) 20 MEQ tablet Take 1 tablet (20 mEq total) by mouth daily.   pravastatin (PRAVACHOL) 40 MG tablet Take 1 tablet (40 mg total) by mouth at bedtime.   No facility-administered encounter medications on file as of 04/23/2023.    Allergies (verified) Patient has no known allergies.   History: Past Medical History:  Diagnosis Date  Acute metabolic encephalopathy 09/23/2022   AKI (acute kidney injury) (HCC) 09/23/2022   Allergy    Arthritis    CAD (coronary artery disease)    Chronic kidney disease    pt is not on dialysis   Diabetes mellitus without complication (HCC)    GERD (gastroesophageal reflux disease)    Hypercalcemia 09/23/2022   Hyperlipidemia    Hypertension    Hypertensive urgency 09/23/2022   Hypokalemia 08/18/2020   Lactic acidosis 09/23/2022   Sinus bradycardia 09/24/2022   Past Surgical History:  Procedure Laterality Date   BOWEL RESECTION  2004   CATARACT EXTRACTION W/PHACO Right 12/24/2022   Procedure: CATARACT EXTRACTION PHACO AND  INTRAOCULAR LENS PLACEMENT (IOC) RIGHT;  Surgeon: Nevada Crane, MD;  Location: [facility];  Service: Ophthalmology;  Laterality: Right;  6.06 0:37.7   CATARACT EXTRACTION W/PHACO Left 01/07/2023   Procedure: CATARACT EXTRACTION PHACO AND INTRAOCULAR LENS PLACEMENT (IOC) LEFT;  Surgeon: Nevada Crane, MD;  Location: [facility];  Service: Ophthalmology;  Laterality: Left;  5.63 0:36.7   CORONARY ARTERY BYPASS GRAFT     FINGER AMPUTATION Left 2011   5th   INGUINAL HERNIA REPAIR Right 06/01/2020   Procedure: HERNIA REPAIR INGUINAL ADULT, open;  Surgeon: Duanne Guess, MD;  Location: [facility];  Service: General;  Laterality: Right;   KNEE ARTHROSCOPY Right 09/12/2015   Procedure: ARTHROSCOPY KNEE, LATERAL MENISECTOMY, CHONDROPLASTY;  Surgeon: Donato Heinz, MD;  Location: [facility];  Service: Orthopedics;  Laterality: Right;   WISDOM TOOTH EXTRACTION     Family History  Problem Relation Age of Onset   Arthritis Mother    Hypertension Mother    Breast cancer Mother    Arthritis Father    Hypertension Father    Colon cancer Father    Social History   Socioeconomic History   Marital status: Widowed    Spouse name: Not on file   Number of children: 2   Years of education: Not on file   Highest education level: 12th grade  Occupational History   Occupation: retired  Tobacco Use   Smoking status: Never   Smokeless tobacco: Never  Vaping Use   Vaping status: Never Used  Substance and Sexual Activity   Alcohol use: No    Alcohol/week: 0.0 standard drinks of alcohol   Drug use: No   Sexual activity: Not Currently  Other Topics Concern   Not on file  Social History Narrative   Widowed x 2, 1 daughter and 1 son   Social Determinants of Health   Financial Resource Strain: Low Risk  (04/23/2023)   Overall Financial Resource Strain (CARDIA)    Difficulty of Paying Living Expenses: Not hard at all  Food Insecurity: No Food Insecurity (04/23/2023)   Hunger  Vital Sign    Worried About Running Out of Food in the Last Year: Never true    Ran Out of Food in the Last Year: Never true  Transportation Needs: No Transportation Needs (04/23/2023)   PRAPARE - Administrator, Civil Service (Medical): No    Lack of Transportation (Non-Medical): No  Physical Activity: Insufficiently Active (04/23/2023)   Exercise Vital Sign    Days of Exercise per Week: 5 days    Minutes of Exercise per Session: 10 min  Stress: No Stress Concern Present (04/23/2023)   [employer] of Occupational Health - Occupational Stress Questionnaire    Feeling of Stress : Not at all  Social Connections: Moderately Isolated (04/23/2023)  Social Connection and Isolation Panel [NHANES]    Frequency of Communication with Friends and Family: Twice a week    Frequency of Social Gatherings with Friends and Family: Three times a week    Attends Religious Services: More than 4 times per year    Active Member of Clubs or Organizations: No    Attends Banker Meetings: Never    Marital Status: Widowed    Tobacco Counseling Counseling given: Not Answered   Clinical Intake:  Pre-visit preparation completed: Yes  Pain : No/denies pain     BMI - recorded: 23.68 Nutritional Status: BMI of 19-24  Normal Nutritional Risks: None Diabetes: Yes CBG done?: No Did pt. bring in CBG monitor from home?: No  How often do you need to have someone help you when you read instructions, pamphlets, or other written materials from your doctor or pharmacy?: 1 - Never  Interpreter Needed?: No  Information entered by :: Tora Kindred, CMA   Activities of Daily Living    04/23/2023    8:18 AM 01/07/2023    8:23 AM  In your present state of health, do you have any difficulty performing the following activities:  Hearing? 0 1  Vision? 0 1  Difficulty concentrating or making decisions? 0 0  Walking or climbing stairs? 0 1  Comment goes slow   Dressing or bathing? 0 0   Doing errands, shopping? 0   Preparing Food and eating ? N   Using the Toilet? N   In the past six months, have you accidently leaked urine? N   Do you have problems with loss of bowel control? N   Managing your Medications? Y   Comment daughter fills pill box   Managing your Finances? N   Housekeeping or managing your Housekeeping? N     Patient Care Team: Larae Grooms, NP as PCP - General (Nurse Practitioner) Steele Sizer, MD as PCP - Family Medicine (Family Medicine) Elinor Parkinson, [place] as Consulting Physician (Podiatry) Lajean Manes, [facility] (Inactive) as Pharmacist (Pharmacist) Kemper Durie, RN as [facility] Care Management Kemper Durie, RN as [facility] Care Management  Indicate any recent Medical Services you may have received from other than [facility] providers in the past year (date may be approximate).     Assessment:   This is a routine wellness examination for [place].  Hearing/Vision screen Hearing Screening - Comments:: Denies hearing difficulty Vision Screening - Comments:: Gets eye exams  Dietary issues and exercise activities discussed:     Goals Addressed               This Visit's Progress     Patient Stated (pt-stated)        Drink more water      Depression Screen    04/23/2023    8:28 AM 02/13/2023    1:15 PM 02/13/2023    1:11 PM 12/05/2022   10:45 AM 11/01/2022    1:37 PM 10/10/2022    1:04 PM 09/20/2022   10:37 AM  PHQ 2/9 Scores  PHQ - 2 Score 0 2 0 1 1 0 2  PHQ- 9 Score 0 4 0 8 3 1 7     Fall Risk    04/23/2023    8:31 AM 02/13/2023    1:15 PM 02/13/2023    1:11 PM 12/05/2022   10:44 AM 10/10/2022    1:03 PM  Fall Risk   Falls in the past year? 0 1 0 1  1  Number falls in past yr: 0 0 0 1 1  Injury with Fall? 0 0 0 0 0  Risk for fall due to : No Fall Risks No Fall Risks No Fall Risks Impaired balance/gait;History of fall(s) History of fall(s);Impaired mobility;Impaired balance/gait  Follow up Falls  prevention discussed Falls evaluation completed Falls evaluation completed Falls evaluation completed Falls evaluation completed    MEDICARE RISK AT HOME: Medicare Risk at Home Any stairs in or around the home?: Yes If so, are there any without handrails?: No Home free of loose throw rugs in walkways, pet beds, electrical cords, etc?: Yes Adequate lighting in your home to reduce risk of falls?: Yes Life alert?: No Use of a cane, walker or w/c?: Yes ("walking stick") Grab bars in the bathroom?: No Shower chair or bench in shower?: No Elevated toilet seat or a handicapped toilet?: Yes  TIMED UP AND GO:  Was the test performed?  No    Cognitive Function:        04/23/2023    8:33 AM 04/18/2022    8:34 AM 04/17/2021    8:21 AM 04/15/2020    8:37 AM 02/11/2019   10:12 AM  6CIT Screen  What Year? 0 points 0 points 0 points 0 points 0 points  What month? 0 points 0 points 0 points 0 points 0 points  What time? 0 points 0 points 0 points 0 points 0 points  Count back from 20 0 points  0 points 0 points 0 points  Months in reverse 0 points 0 points 0 points 0 points 0 points  Repeat phrase 0 points 2 points 0 points 0 points 0 points  Total Score 0 points  0 points 0 points 0 points    Immunizations Immunization History  Administered Date(s) Administered   Fluad Quad(high Dose 65+) 07/27/2019, 08/18/2020, 07/04/2021, 07/05/2022   Influenza, High Dose Seasonal PF 07/17/2016, 06/24/2017, 07/02/2018   Influenza,inj,Quad PF,6+ Mos 06/02/2015   PFIZER(Purple Top)SARS-COV-2 Vaccination 10/13/2019, 11/03/2019, 08/23/2020   Pneumococcal Conjugate-13 12/01/2015   Pneumococcal Polysaccharide-23 07/04/2021   Pneumococcal-Unspecified 07/01/2007   Td 05/25/2008, 07/02/2018    TDAP status: Up to date  Flu Vaccine status: Due, Education has been provided regarding the importance of this vaccine. Advised may receive this vaccine at local pharmacy or Health Dept. Aware to provide a copy of the  vaccination record if obtained from local pharmacy or Health Dept. Verbalized acceptance and understanding.  Pneumococcal vaccine status: Up to date  Covid-19 vaccine status: Declined, Education has been provided regarding the importance of this vaccine but patient still declined. Advised may receive this vaccine at local pharmacy or Health Dept.or vaccine clinic. Aware to provide a copy of the vaccination record if obtained from local pharmacy or Health Dept. Verbalized acceptance and understanding.  Qualifies for Shingles Vaccine? Yes   Zostavax completed No   Shingrix Completed?: No.    Education has been provided regarding the importance of this vaccine. Patient has been advised to call insurance company to determine out of pocket expense if they have not yet received this vaccine. Advised may also receive vaccine at local pharmacy or Health Dept. Verbalized acceptance and understanding.  Screening Tests Health Maintenance  Topic Date Due   Colonoscopy  08/21/2018   COVID-19 Vaccine (4 - 2023-24 season) 05/04/2022   INFLUENZA VACCINE  04/04/2023   Zoster Vaccines- Shingrix (1 of 2) 05/16/2023 (Originally 10/09/1990)   HEMOGLOBIN A1C  08/15/2023   OPHTHALMOLOGY EXAM  08/17/2023   Diabetic kidney  evaluation - Urine ACR  12/05/2023   FOOT EXAM  12/05/2023   Diabetic kidney evaluation - eGFR measurement  04/15/2024   Medicare Annual Wellness (AWV)  04/22/2024   DTaP/Tdap/Td (3 - Tdap) 07/02/2028   Pneumonia Vaccine 2+ Years old  Completed   HPV VACCINES  Aged Out   Hepatitis C Screening  Discontinued    Health Maintenance  Health Maintenance Due  Topic Date Due   Colonoscopy  08/21/2018   COVID-19 Vaccine (4 - 2023-24 season) 05/04/2022   INFLUENZA VACCINE  04/04/2023    Colorectal cancer screening: No longer required.   Lung Cancer Screening: (Low Dose CT Chest recommended if Age 59-80 years, 20 pack-year currently smoking OR have quit w/in 15years.) does not qualify.   Lung  Cancer Screening Referral: n/a  Additional Screening:  Hepatitis C Screening: does not qualify; Completed 02/17/20  Vision Screening: Recommended annual ophthalmology exams for early detection of glaucoma and other disorders of the eye. Is the patient up to date with their annual eye exam?  Yes  Who is the provider or what is the name of the office in which the patient attends annual eye exams? Dr. Brooke Dare, [facility] If pt is not established with a provider, would they like to be referred to a provider to establish care? No .   Dental Screening: Recommended annual dental exams for proper oral hygiene  Diabetic Foot Exam: Diabetic Foot Exam: Completed 12/05/22  Community Resource Referral / Chronic Care Management: CRR required this visit?  No   CCM required this visit?  No     Plan:     I have personally reviewed and noted the following in the patient's chart:   Medical and social history Use of alcohol, tobacco or illicit drugs  Current medications and supplements including opioid prescriptions. Patient is not currently taking opioid prescriptions. Functional ability and status Nutritional status Physical activity Advanced directives List of other physicians Hospitalizations, surgeries, and ER visits in previous 12 months Vitals Screenings to include cognitive, depression, and falls Referrals and appointments  In addition, I have reviewed and discussed with patient certain preventive protocols, quality metrics, and best practice recommendations. A written personalized care plan for preventive services as well as general preventive health recommendations were provided to patient.     Tora Kindred, CMA   04/23/2023   After Visit Summary: (MyChart) Due to this being a telephonic visit, the after visit summary with patients personalized plan was offered to patient via MyChart   Nurse Notes:  Patient could not verify his medications. Daughter has them and fills his pill  box. Denies DM & Nutrition education referral Will get flu shot in the fall.

## 2023-04-23 NOTE — Patient Instructions (Addendum)
[name] , Thank you for taking time to come for your Medicare Wellness Visit. I appreciate your ongoing commitment to your health goals. Please review the following plan we discussed and let me know if I can assist you in the future.   Referrals/Orders/Follow-Ups/Clinician Recommendations: Get your flu shot this fall  This is a list of the screening recommended for you and due dates:  Health Maintenance  Topic Date Due   Colon Cancer Screening  08/21/2018   COVID-19 Vaccine (4 - 2023-24 season) 05/04/2022   Flu Shot  04/04/2023   Zoster (Shingles) Vaccine (1 of 2) 05/16/2023*   Hemoglobin A1C  08/15/2023   Eye exam for diabetics  08/17/2023   Yearly kidney health urinalysis for diabetes  12/05/2023   Complete foot exam   12/05/2023   Yearly kidney function blood test for diabetes  04/15/2024   Medicare Annual Wellness Visit  04/22/2024   DTaP/Tdap/Td vaccine (3 - Tdap) 07/02/2028   Pneumonia Vaccine  Completed   HPV Vaccine  Aged Out   Hepatitis C Screening  Discontinued  *Topic was postponed. The date shown is not the original due date.    Advanced directives: (ACP Link)Information on Advanced Care Planning can be found at [facility] Advance Health Care Directives Advance Health Care Directives ([email or web address]   Please bring a copy of your health care power of attorney and living will to the office to be added to your chart at your convenience.   Next Medicare Annual Wellness Visit scheduled for next year: Yes, 04/28/24 @ 8:15am  Preventive Care 65 Years and Older, Male  Preventive care refers to lifestyle choices and visits with your health care provider that can promote health and wellness. What does preventive care include? A yearly physical exam. This is also called an annual well check. Dental exams once or twice a year. Routine eye exams. Ask your health care provider how often you should have your eyes checked. Personal lifestyle choices,  including: Daily care of your teeth and gums. Regular physical activity. Eating a healthy diet. Avoiding tobacco and drug use. Limiting alcohol use. Practicing safe sex. Taking low doses of aspirin every day. Taking vitamin and mineral supplements as recommended by your health care provider. What happens during an annual well check? The services and screenings done by your health care provider during your annual well check will depend on your age, overall health, lifestyle risk factors, and family history of disease. Counseling  Your health care provider may ask you questions about your: Alcohol use. Tobacco use. Drug use. Emotional well-being. Home and relationship well-being. Sexual activity. Eating habits. History of falls. Memory and ability to understand (cognition). Work and work Astronomer. Screening  You may have the following tests or measurements: Height, weight, and BMI. Blood pressure. Lipid and cholesterol levels. These may be checked every 5 years, or more frequently if you are over 63 years old. Skin check. Lung cancer screening. You may have this screening every year starting at age 67 if you have a 30-pack-year history of smoking and currently smoke or have quit within the past 15 years. Fecal occult blood test (FOBT) of the stool. You may have this test every year starting at age 63. Flexible sigmoidoscopy or colonoscopy. You may have a sigmoidoscopy every 5 years or a colonoscopy every 10 years starting at age 29. Prostate cancer screening. Recommendations will vary depending on your family history and other risks. Hepatitis C blood test. Hepatitis B blood test. Sexually  transmitted disease (STD) testing. Diabetes screening. This is done by checking your blood sugar (glucose) after you have not eaten for a while (fasting). You may have this done every 1-3 years. Abdominal aortic aneurysm (AAA) screening. You may need this if you are a current or former  smoker. Osteoporosis. You may be screened starting at age 7 if you are at high risk. Talk with your health care provider about your test results, treatment options, and if necessary, the need for more tests. Vaccines  Your health care provider may recommend certain vaccines, such as: Influenza vaccine. This is recommended every year. Tetanus, diphtheria, and acellular pertussis (Tdap, Td) vaccine. You may need a Td booster every 10 years. Zoster vaccine. You may need this after age 73. Pneumococcal 13-valent conjugate (PCV13) vaccine. One dose is recommended after age 56. Pneumococcal polysaccharide (PPSV23) vaccine. One dose is recommended after age 2. Talk to your health care provider about which screenings and vaccines you need and how often you need them. This information is not intended to replace advice given to you by your health care provider. Make sure you discuss any questions you have with your health care provider. Document Released: 09/16/2015 Document Revised: 05/09/2016 Document Reviewed: 06/21/2015 Elsevier Interactive Patient Education  2017 [employer].  Fall Prevention in the Home Falls can cause injuries. They can happen to people of all ages. There are many things you can do to make your home safe and to help prevent falls. What can I do on the outside of my home? Regularly fix the edges of walkways and driveways and fix any cracks. Remove anything that might make you trip as you walk through a door, such as a raised step or threshold. Trim any bushes or trees on the path to your home. Use bright outdoor lighting. Clear any walking paths of anything that might make someone trip, such as rocks or tools. Regularly check to see if handrails are loose or broken. Make sure that both sides of any steps have handrails. Any raised decks and porches should have guardrails on the edges. Have any leaves, snow, or ice cleared regularly. Use sand or salt on walking paths during  winter. Clean up any spills in your garage right away. This includes oil or grease spills. What can I do in the bathroom? Use night lights. Install grab bars by the toilet and in the tub and shower. Do not use towel bars as grab bars. Use non-skid mats or decals in the tub or shower. If you need to sit down in the shower, use a plastic, non-slip stool. Keep the floor dry. Clean up any water that spills on the floor as soon as it happens. Remove soap buildup in the tub or shower regularly. Attach bath mats securely with double-sided non-slip rug tape. Do not have throw rugs and other things on the floor that can make you trip. What can I do in the bedroom? Use night lights. Make sure that you have a light by your bed that is easy to reach. Do not use any sheets or blankets that are too big for your bed. They should not hang down onto the floor. Have a firm chair that has side arms. You can use this for support while you get dressed. Do not have throw rugs and other things on the floor that can make you trip. What can I do in the kitchen? Clean up any spills right away. Avoid walking on wet floors. Keep items that you use  a lot in easy-to-reach places. If you need to reach something above you, use a strong step stool that has a grab bar. Keep electrical cords out of the way. Do not use floor polish or wax that makes floors slippery. If you must use wax, use non-skid floor wax. Do not have throw rugs and other things on the floor that can make you trip. What can I do with my stairs? Do not leave any items on the stairs. Make sure that there are handrails on both sides of the stairs and use them. Fix handrails that are broken or loose. Make sure that handrails are as long as the stairways. Check any carpeting to make sure that it is firmly attached to the stairs. Fix any carpet that is loose or worn. Avoid having throw rugs at the top or bottom of the stairs. If you do have throw rugs,  attach them to the floor with carpet tape. Make sure that you have a light switch at the top of the stairs and the bottom of the stairs. If you do not have them, ask someone to add them for you. What else can I do to help prevent falls? Wear shoes that: Do not have high heels. Have rubber bottoms. Are comfortable and fit you well. Are closed at the toe. Do not wear sandals. If you use a stepladder: Make sure that it is fully opened. Do not climb a closed stepladder. Make sure that both sides of the stepladder are locked into place. Ask someone to hold it for you, if possible. Clearly mark and make sure that you can see: Any grab bars or handrails. First and last steps. Where the edge of each step is. Use tools that help you move around (mobility aids) if they are needed. These include: Canes. Walkers. Scooters. Crutches. Turn on the lights when you go into a dark area. Replace any light bulbs as soon as they burn out. Set up your furniture so you have a clear path. Avoid moving your furniture around. If any of your floors are uneven, fix them. If there are any pets around you, be aware of where they are. Review your medicines with your doctor. Some medicines can make you feel dizzy. This can increase your chance of falling. Ask your doctor what other things that you can do to help prevent falls. This information is not intended to replace advice given to you by your health care provider. Make sure you discuss any questions you have with your health care provider. Document Released: 06/16/2009 Document Revised: 01/26/2016 Document Reviewed: 09/24/2014 Elsevier Interactive Patient Education  2017 [employer].

## 2023-04-24 NOTE — Telephone Encounter (Signed)
Requested Prescriptions  Pending Prescriptions Disp Refills   furosemide (LASIX) 40 MG tablet [Pharmacy Med Name: FUROSEMIDE 40 MG Tablet] 90 tablet 1    Sig: TAKE 1 TABLET EVERY DAY     Cardiovascular:  Diuretics - Loop Failed - 04/23/2023  1:51 AM      Failed - Ca in normal range and within 180 days    Calcium  Date Value Ref Range Status  04/16/2023 10.3 (H) 8.6 - 10.2 mg/dL Final   Calcium, Ion  Date Value Ref Range Status  06/01/2020 1.31 1.15 - 1.40 mmol/L Final         Failed - Cr in normal range and within 180 days    Creatinine, Ser  Date Value Ref Range Status  04/16/2023 1.49 (H) 0.76 - 1.27 mg/dL Final         Failed - Mg Level in normal range and within 180 days    Magnesium  Date Value Ref Range Status  10/10/2022 1.4 (L) 1.6 - 2.3 mg/dL Final         Passed - K in normal range and within 180 days    Potassium  Date Value Ref Range Status  04/16/2023 4.1 3.5 - 5.2 mmol/L Final         Passed - Na in normal range and within 180 days    Sodium  Date Value Ref Range Status  04/16/2023 141 134 - 144 mmol/L Final         Passed - Cl in normal range and within 180 days    Chloride  Date Value Ref Range Status  04/16/2023 102 96 - 106 mmol/L Final         Passed - Last BP in normal range    BP Readings from Last 1 Encounters:  02/13/23 138/68         Passed - Valid encounter within last 6 months    Recent Outpatient Visits           2 months ago Hypertension associated with diabetes [facility])   [place] [facility] Larae Grooms, NP   4 months ago Hypertension associated with diabetes [facility])   [place] [facility] Larae Grooms, NP   5 months ago Type 2 diabetes mellitus with hyperglycemia, without long-term current use of insulin (HCC)   [place] [facility] Larae Grooms, NP   6 months ago Hospital discharge follow-up   [place] [facility] Larae Grooms, NP   7 months  ago Left hip pain   [place] [facility] Larae Grooms, NP       Future Appointments             In 1 month Larae Grooms, NP [place] [name]             doxazosin (CARDURA) 2 MG tablet [Pharmacy Med Name: DOXAZOSIN MESYLATE 2 MG Tablet] 90 tablet 1    Sig: TAKE 1 TABLET EVERY MORNING     Cardiovascular:  Alpha Blockers Passed - 04/23/2023  1:51 AM      Passed - Last BP in normal range    BP Readings from Last 1 Encounters:  02/13/23 138/68         Passed - Valid encounter within last 6 months    Recent Outpatient Visits           2 months ago Hypertension associated with diabetes (HCC)   [place] [facility]  Larae Grooms, NP   4 months ago Hypertension associated with diabetes [facility])   [place] [facility] Larae Grooms, NP   5 months ago Type 2 diabetes mellitus with hyperglycemia, without long-term current use of insulin [facility])   [place] [facility] Larae Grooms, NP   6 months ago Hospital discharge follow-up   [place] [facility] Larae Grooms, NP   7 months ago Left hip pain   [place] [facility] Larae Grooms, NP       Future Appointments             In 1 month Larae Grooms, NP [place] [name]             omeprazole (PRILOSEC) 20 MG capsule [Pharmacy Med Name: OMEPRAZOLE 20 MG Capsule Delayed Release] 90 capsule 1    Sig: TAKE 1 CAPSULE EVERY DAY AS NEEDED (GERD SYMPTOMS)     Gastroenterology: Proton Pump Inhibitors Passed - 04/23/2023  1:51 AM      Passed - Valid encounter within last 12 months    Recent Outpatient Visits           2 months ago Hypertension associated with diabetes [facility])   [place] [facility] Larae Grooms, NP   4 months ago Hypertension associated with diabetes [facility])   [place] [facility] Larae Grooms, NP    5 months ago Type 2 diabetes mellitus with hyperglycemia, without long-term current use of insulin (HCC)   [place] [facility] Larae Grooms, NP   6 months ago Hospital discharge follow-up   [facility] Larae Grooms, NP   7 months ago Left hip pain   [place] [facility] Larae Grooms, NP       Future Appointments             In 1 month Larae Grooms, NP [place] [name]             potassium chloride SA (KLOR-CON M) 20 MEQ tablet [Pharmacy Med Name: POTASSIUM CHLORIDE ER 20 MEQ Tablet Extended Release] 90 tablet 1    Sig: TAKE 1 TABLET EVERY DAY     Endocrinology:  Minerals - Potassium Supplementation Failed - 04/23/2023  1:51 AM      Failed - Cr in normal range and within 360 days    Creatinine, Ser  Date Value Ref Range Status  04/16/2023 1.49 (H) 0.76 - 1.27 mg/dL Final         Passed - K in normal range and within 360 days    Potassium  Date Value Ref Range Status  04/16/2023 4.1 3.5 - 5.2 mmol/L Final         Passed - Valid encounter within last 12 months    Recent Outpatient Visits           2 months ago Hypertension associated with diabetes [facility])   [place] [facility] Larae Grooms, NP   4 months ago Hypertension associated with diabetes [facility])   [place] [facility] Larae Grooms, NP   5 months ago Type 2 diabetes mellitus with hyperglycemia, without long-term current use of insulin [facility])   [place] [facility] Larae Grooms, NP   6 months ago Hospital discharge follow-up   [place] [facility] Larae Grooms, NP   7 months ago Left hip pain   [place] [facility]  Larae Grooms, NP       Future Appointments             In 1 month Larae Grooms, NP [place] [facility]

## 2023-05-01 ENCOUNTER — Telehealth: Payer: Self-pay | Admitting: Nurse Practitioner

## 2023-05-01 NOTE — Telephone Encounter (Signed)
Medication Refill - Medication: accucheck test strips/ soft clix lancets  Has the patient contacted their pharmacy? yes ((Agent: If yes, when and what did the pharmacy advise?)contact pcp  Preferred Pharmacy (with phone number or street name):  [facility] - [place], [place] - [address] Phone: [phone]  Fax: [phone]     Has the patient been seen for an appointment in the last year OR does the patient have an upcoming appointment? yes  Agent: Please be advised that RX refills may take up to 3 business days. We ask that you follow-up with your pharmacy.

## 2023-05-01 NOTE — Telephone Encounter (Signed)
Attempted to reach pt. Not on current med profile.Marland Kitchen  LRFs in 2022 Historical Providers.  Please review.  Medication Refill - Medication: accucheck test strips/ soft clix lancets   Has the patient contacted their pharmacy? yes ((Agent: If yes, when and what did the pharmacy advise?)contact pcp   Preferred Pharmacy (with phone number or street name):  [facility] - [place], [place] - [address] Phone: [phone]  Fax: [phone]      Has the patient been seen for an appointment in the last year OR does the patient have an upcoming appointment? yes   Agent: Please be advised that RX refills may take up to 3 business days. We ask that you follow-up with your pharmacy.

## 2023-05-03 ENCOUNTER — Other Ambulatory Visit: Payer: Self-pay | Admitting: Nurse Practitioner

## 2023-05-03 DIAGNOSIS — E1165 Type 2 diabetes mellitus with hyperglycemia: Secondary | ICD-10-CM

## 2023-05-03 NOTE — Telephone Encounter (Signed)
Requested Prescriptions  Pending Prescriptions Disp Refills   amLODipine (NORVASC) 10 MG tablet [Pharmacy Med Name: AMLODIPINE BESYLATE 10 MG Tablet] 90 tablet 1    Sig: TAKE 1 TABLET EVERY DAY     Cardiovascular: Calcium Channel Blockers 2 Passed - 05/03/2023  2:17 AM      Passed - Last BP in normal range    BP Readings from Last 1 Encounters:  02/13/23 138/68         Passed - Last Heart Rate in normal range    Pulse Readings from Last 1 Encounters:  02/13/23 94         Passed - Valid encounter within last 6 months    Recent Outpatient Visits           2 months ago Hypertension associated with diabetes [facility])   [place] [facility] Larae Grooms, NP   4 months ago Hypertension associated with diabetes [facility])   [place] [facility] Larae Grooms, NP   6 months ago Type 2 diabetes mellitus with hyperglycemia, without long-term current use of insulin (HCC)   [place] [facility] Larae Grooms, NP   6 months ago Hospital discharge follow-up   [place] [facility] Larae Grooms, NP   7 months ago Left hip pain   [place] [facility] Larae Grooms, NP       Future Appointments             In 1 month Larae Grooms, NP [place] [name]             pravastatin (PRAVACHOL) 40 MG tablet [Pharmacy Med Name: PRAVASTATIN SODIUM 40 MG Tablet] 90 tablet 3    Sig: TAKE 1 TABLET AT BEDTIME     Cardiovascular:  Antilipid - Statins Failed - 05/03/2023  2:17 AM      Failed - Lipid Panel in normal range within the last 12 months    Cholesterol, Total  Date Value Ref Range Status  02/13/2023 120 100 - 199 mg/dL Final   Cholesterol Piccolo, Waived  Date Value Ref Range Status  07/02/2018 143 <200 mg/dL Final    Comment:                            Desirable                <200                         Borderline High      200- 239                         High                      >239    LDL Chol Calc (NIH)  Date Value Ref Range Status  02/13/2023 49 0 - 99 mg/dL Final   HDL  Date Value Ref Range Status  02/13/2023 52 >39 mg/dL Final   Triglycerides  Date Value Ref Range Status  02/13/2023 104 0 - 149 mg/dL Final   Triglycerides Piccolo,Waived  Date Value Ref Range Status  07/02/2018 124 <150 mg/dL Final    Comment:  Normal                   <150                         Borderline High     150 - 199                         High                200 - 499                         Very High                >499          Passed - Patient is not pregnant      Passed - Valid encounter within last 12 months    Recent Outpatient Visits           2 months ago Hypertension associated with diabetes [facility])   [place] [facility] Larae Grooms, NP   4 months ago Hypertension associated with diabetes [facility])   [place] [facility] Larae Grooms, NP   6 months ago Type 2 diabetes mellitus with hyperglycemia, without long-term current use of insulin [facility])   [place] [facility] Larae Grooms, NP   6 months ago Hospital discharge follow-up   [place] [facility] Larae Grooms, NP   7 months ago Left hip pain   [place] [facility] Larae Grooms, NP       Future Appointments             In 1 month Larae Grooms, NP  [place] [facility]

## 2023-06-05 DIAGNOSIS — C44212 Basal cell carcinoma of skin of right ear and external auricular canal: Secondary | ICD-10-CM | POA: Diagnosis not present

## 2023-06-05 DIAGNOSIS — C44319 Basal cell carcinoma of skin of other parts of face: Secondary | ICD-10-CM | POA: Diagnosis not present

## 2023-06-05 DIAGNOSIS — L578 Other skin changes due to chronic exposure to nonionizing radiation: Secondary | ICD-10-CM | POA: Diagnosis not present

## 2023-06-05 DIAGNOSIS — L814 Other melanin hyperpigmentation: Secondary | ICD-10-CM | POA: Diagnosis not present

## 2023-06-12 DIAGNOSIS — L814 Other melanin hyperpigmentation: Secondary | ICD-10-CM | POA: Diagnosis not present

## 2023-06-12 DIAGNOSIS — G8918 Other acute postprocedural pain: Secondary | ICD-10-CM | POA: Diagnosis not present

## 2023-06-12 DIAGNOSIS — C44311 Basal cell carcinoma of skin of nose: Secondary | ICD-10-CM | POA: Diagnosis not present

## 2023-06-12 DIAGNOSIS — I781 Nevus, non-neoplastic: Secondary | ICD-10-CM | POA: Diagnosis not present

## 2023-06-12 DIAGNOSIS — L988 Other specified disorders of the skin and subcutaneous tissue: Secondary | ICD-10-CM | POA: Diagnosis not present

## 2023-06-17 ENCOUNTER — Encounter: Payer: Self-pay | Admitting: Nurse Practitioner

## 2023-06-17 ENCOUNTER — Ambulatory Visit: Payer: Medicare HMO | Admitting: Nurse Practitioner

## 2023-06-17 VITALS — BP 135/82 | HR 97 | Temp 97.7°F | Wt 164.4 lb

## 2023-06-17 DIAGNOSIS — E1159 Type 2 diabetes mellitus with other circulatory complications: Secondary | ICD-10-CM

## 2023-06-17 DIAGNOSIS — C44311 Basal cell carcinoma of skin of nose: Secondary | ICD-10-CM | POA: Diagnosis not present

## 2023-06-17 DIAGNOSIS — E1165 Type 2 diabetes mellitus with hyperglycemia: Secondary | ICD-10-CM | POA: Diagnosis not present

## 2023-06-17 DIAGNOSIS — E1169 Type 2 diabetes mellitus with other specified complication: Secondary | ICD-10-CM

## 2023-06-17 DIAGNOSIS — I2583 Coronary atherosclerosis due to lipid rich plaque: Secondary | ICD-10-CM

## 2023-06-17 DIAGNOSIS — Z23 Encounter for immunization: Secondary | ICD-10-CM | POA: Diagnosis not present

## 2023-06-17 DIAGNOSIS — Z7984 Long term (current) use of oral hypoglycemic drugs: Secondary | ICD-10-CM

## 2023-06-17 DIAGNOSIS — I2510 Atherosclerotic heart disease of native coronary artery without angina pectoris: Secondary | ICD-10-CM | POA: Diagnosis not present

## 2023-06-17 DIAGNOSIS — E785 Hyperlipidemia, unspecified: Secondary | ICD-10-CM | POA: Diagnosis not present

## 2023-06-17 DIAGNOSIS — I739 Peripheral vascular disease, unspecified: Secondary | ICD-10-CM

## 2023-06-17 DIAGNOSIS — I152 Hypertension secondary to endocrine disorders: Secondary | ICD-10-CM

## 2023-06-17 NOTE — Assessment & Plan Note (Signed)
Chronic.  Controlled.  Family checks sugars but patient forgot book for appointment.  Last A1c in June was 6.1%.  No longer on insulin. If A1c elevated at next visit will add SGLT2.  Follow up in 3 month.  Call sooner if concerns arise.

## 2023-06-17 NOTE — Progress Notes (Signed)
BP 135/82   Pulse 97   Temp 97.7 F (36.5 C) (Oral)   Wt 164 lb 6.4 oz (74.6 kg)   SpO2 98%   BMI 23.59 kg/m    Subjective:    Patient ID: Ryan Kirby, male    DOB: [birth year], 82 y.o.   MRN: [id number]  HPI: Ryan Kirby is a 82 y.o. male  Chief Complaint  Patient presents with   Diabetes   Hyperlipidemia   Hypertension   HYPERTENSION / HYPERLIPIDEMIA Satisfied with current treatment? no Duration of hypertension: years BP monitoring frequency: not checking BP range:  BP medication side effects: no Past BP meds: amlodipine and benazepril/HCTZ Duration of hyperlipidemia: years Cholesterol medication side effects: no Cholesterol supplements: none Past cholesterol medications: pravastatin (pravachol) Medication compliance: excellent compliance Aspirin: no Recent stressors: no Recurrent headaches: no Visual changes: no Palpitations: no Dyspnea: no Chest pain: no Lower extremity edema: no Dizzy/lightheaded: no  DIABETES Hypoglycemic episodes:no Polydipsia/polyuria: no Visual disturbance: no Chest pain: no Paresthesias: no Glucose Monitoring: no  Accucheck frequency: granddaughter checks sugars and he forgot his book  Fasting glucose:  Post prandial: 140-160  Evening:  Before meals: Taking Insulin?: no  Long acting insulin:  Short acting insulin: Blood Pressure Monitoring: not checking Retinal Examination: Up to Date- has his eye exam scheduled for the end of the month Foot Exam: Up to Date Diabetic Education: Not Completed Pneumovax: Up to Date Influenza: Up to Date Aspirin: yes  Patient had basal cell carcinoma removed from his fast last wednesday.  They cut on his nose 7 times.    Relevant past medical, surgical, family and social history reviewed and updated as indicated. Interim medical history since our last visit reviewed. Allergies and medications reviewed and updated.  Review of Systems  Eyes:  Negative for visual disturbance.   Respiratory:  Negative for chest tightness and shortness of breath.   Cardiovascular:  Negative for chest pain, palpitations and leg swelling.  Endocrine: Negative for polydipsia and polyuria.  Skin:        Stiches on nose from basal cell removal.  Neurological:  Negative for dizziness, light-headedness, numbness and headaches.    Per HPI unless specifically indicated above     Objective:    BP 135/82   Pulse 97   Temp 97.7 F (36.5 C) (Oral)   Wt 164 lb 6.4 oz (74.6 kg)   SpO2 98%   BMI 23.59 kg/m   Wt Readings from Last 3 Encounters:  06/17/23 164 lb 6.4 oz (74.6 kg)  04/23/23 165 lb (74.8 kg)  02/13/23 166 lb 9.6 oz (75.6 kg)    Physical Exam Vitals and nursing note reviewed.  Constitutional:      General: He is not in acute distress.    Appearance: Normal appearance. He is not ill-appearing, toxic-appearing or diaphoretic.  HENT:     Head: Normocephalic.     Comments: Large incision on nose- no signs of infection present.    Right Ear: External ear normal.     Left Ear: External ear normal.     Nose: Nose normal. No congestion or rhinorrhea.     Mouth/Throat:     Mouth: Mucous membranes are moist.  Eyes:     General:        Right eye: No discharge.        Left eye: No discharge.     Extraocular Movements: Extraocular movements intact.     Conjunctiva/sclera: Conjunctivae normal.     Pupils:  Pupils are equal, round, and reactive to light.  Cardiovascular:     Rate and Rhythm: Normal rate and regular rhythm.     Heart sounds: No murmur heard. Pulmonary:     Effort: Pulmonary effort is normal. No respiratory distress.     Breath sounds: Normal breath sounds. No wheezing, rhonchi or rales.  Abdominal:     General: Abdomen is flat. Bowel sounds are normal.  Musculoskeletal:     Cervical back: Normal range of motion and neck supple.  Skin:    General: Skin is warm and dry.     Capillary Refill: Capillary refill takes less than 2 seconds.  Neurological:      General: No focal deficit present.     Mental Status: He is alert and oriented to person, place, and time.  Psychiatric:        Mood and Affect: Mood normal.        Behavior: Behavior normal.        Thought Content: Thought content normal.        Judgment: Judgment normal.     Results for orders placed or performed in visit on 04/16/23  Basic metabolic panel  Result Value Ref Range   Glucose 151 (H) 70 - 99 mg/dL   BUN 18 8 - 27 mg/dL   Creatinine, Ser 7.82 (H) 0.76 - 1.27 mg/dL   eGFR 47 (L) >95 AO/ZHY/8.65   BUN/Creatinine Ratio 12 10 - 24   Sodium 141 134 - 144 mmol/L   Potassium 4.1 3.5 - 5.2 mmol/L   Chloride 102 96 - 106 mmol/L   CO2 20 20 - 29 mmol/L   Calcium 10.3 (H) 8.6 - 10.2 mg/dL      Assessment & Plan:   Problem List Items Addressed This Visit       Cardiovascular and Mediastinum   Hypertension associated with diabetes (HCC)    Chronic.  Controlled.  Continue with current medication regimen of Amlodipine, benzepril and Furosemide.  Return to clinic in 3 months for reevaluation.  Labs ordered today.  Call sooner if concerns arise.       Relevant Orders   Comp Met (CMET)   Lipid Profile   CAD (coronary artery disease)    Chronic. Stable.  No chest pain at visit today.  Continue to follow up with Cardiology.      Relevant Orders   Comp Met (CMET)   Lipid Profile   PVD (peripheral vascular disease) (HCC) - Primary    Chronic.  Controlled.  Continue with current medication regimen.  Labs ordered today.  Return to clinic in 6 months for reevaluation.  Call sooner if concerns arise.        Relevant Orders   Comp Met (CMET)   Lipid Profile     Endocrine   Hyperlipidemia associated with type 2 diabetes mellitus (HCC)    Chronic.  Controlled.  Continue with current medication regimen on Pravastatin 40mg  daily.  Labs ordered at visit today.  Follow up in 3 months. Call sooner if concerns arise.       Relevant Orders   Comp Met (CMET)   Lipid Profile    Diabetes mellitus with hyperglycemia (HCC)    Chronic.  Controlled.  Family checks sugars but patient forgot book for appointment.  Last A1c in June was 6.1%.  No longer on insulin. If A1c elevated at next visit will add SGLT2.  Follow up in 3 month.  Call sooner if concerns arise.  Relevant Orders   HgB A1c     Musculoskeletal and Integument   Basal cell carcinoma of dorsum of nose    Continue to follow up with Dermatology.       Other Visit Diagnoses     Need for influenza vaccination       Relevant Orders   Flu Vaccine Trivalent High Dose (Fluad) (Completed)         Follow up plan: Return in about 3 months (around 09/17/2023) for HTN, HLD, DM2 FU.

## 2023-06-17 NOTE — Assessment & Plan Note (Signed)
Continue to follow up with Dermatology.

## 2023-06-17 NOTE — Assessment & Plan Note (Signed)
Chronic.  Controlled.  Continue with current medication regimen.  Labs ordered today.  Return to clinic in 6 months for reevaluation.  Call sooner if concerns arise.  ? ?

## 2023-06-17 NOTE — Assessment & Plan Note (Signed)
Chronic. Stable.  No chest pain at visit today.  Continue to follow up with Cardiology.

## 2023-06-17 NOTE — Assessment & Plan Note (Addendum)
Chronic.  Controlled.  Continue with current medication regimen on Pravastatin 40mg  daily.  Labs ordered at visit today.  Follow up in 3 months. Call sooner if concerns arise.

## 2023-06-17 NOTE — Assessment & Plan Note (Signed)
Chronic.  Controlled.  Continue with current medication regimen of Amlodipine, benzepril and Furosemide.  Return to clinic in 3 months for reevaluation.  Labs ordered today.  Call sooner if concerns arise.

## 2023-06-18 DIAGNOSIS — I10 Essential (primary) hypertension: Secondary | ICD-10-CM | POA: Diagnosis not present

## 2023-06-18 DIAGNOSIS — N1832 Chronic kidney disease, stage 3b: Secondary | ICD-10-CM | POA: Diagnosis not present

## 2023-06-18 DIAGNOSIS — E1122 Type 2 diabetes mellitus with diabetic chronic kidney disease: Secondary | ICD-10-CM | POA: Diagnosis not present

## 2023-06-18 LAB — COMPREHENSIVE METABOLIC PANEL
ALT: 8 [IU]/L (ref 0–44)
AST: 16 [IU]/L (ref 0–40)
Albumin: 4.4 g/dL (ref 3.7–4.7)
Alkaline Phosphatase: 116 [IU]/L (ref 44–121)
BUN/Creatinine Ratio: 11 (ref 10–24)
BUN: 20 mg/dL (ref 8–27)
Bilirubin Total: 0.3 mg/dL (ref 0.0–1.2)
CO2: 21 mmol/L (ref 20–29)
Calcium: 10.3 mg/dL — ABNORMAL HIGH (ref 8.6–10.2)
Chloride: 102 mmol/L (ref 96–106)
Creatinine, Ser: 1.74 mg/dL — ABNORMAL HIGH (ref 0.76–1.27)
Globulin, Total: 2.2 g/dL (ref 1.5–4.5)
Glucose: 239 mg/dL — ABNORMAL HIGH (ref 70–99)
Potassium: 3.9 mmol/L (ref 3.5–5.2)
Sodium: 141 mmol/L (ref 134–144)
Total Protein: 6.6 g/dL (ref 6.0–8.5)
eGFR: 39 mL/min/{1.73_m2} — ABNORMAL LOW (ref >59)

## 2023-06-18 LAB — HEMOGLOBIN A1C
Est. average glucose Bld gHb Est-mCnc: 126 mg/dL
Hgb A1c MFr Bld: 6 % — ABNORMAL HIGH (ref 4.8–5.6)

## 2023-06-18 LAB — LIPID PANEL
Chol/HDL Ratio: 2.2 {ratio} (ref 0.0–5.0)
Cholesterol, Total: 132 mg/dL (ref 100–199)
HDL: 60 mg/dL (ref >39)
LDL Chol Calc (NIH): 55 mg/dL (ref 0–99)
Triglycerides: 89 mg/dL (ref 0–149)
VLDL Cholesterol Cal: 17 mg/dL (ref 5–40)

## 2023-06-18 NOTE — Progress Notes (Signed)
Please let patient know that his lab work shows that his A1c is well controlled at 6.0.  Keep up the good work.  Continue with current medication regimen.  Make sure to drink enough water, his kidney function was down from prior.  No other concerns at this time.

## 2023-06-19 ENCOUNTER — Other Ambulatory Visit: Payer: Self-pay | Admitting: Nephrology

## 2023-06-19 DIAGNOSIS — E1122 Type 2 diabetes mellitus with diabetic chronic kidney disease: Secondary | ICD-10-CM

## 2023-06-19 DIAGNOSIS — N1832 Chronic kidney disease, stage 3b: Secondary | ICD-10-CM

## 2023-06-27 ENCOUNTER — Ambulatory Visit
Admission: RE | Admit: 2023-06-27 | Discharge: 2023-06-27 | Disposition: A | Discharge status: Home / Self Care | Payer: Medicare HMO | Source: Ambulatory Visit | Attending: Nephrology | Admitting: Nephrology

## 2023-06-27 DIAGNOSIS — N1832 Chronic kidney disease, stage 3b: Secondary | ICD-10-CM | POA: Diagnosis not present

## 2023-06-27 DIAGNOSIS — E1122 Type 2 diabetes mellitus with diabetic chronic kidney disease: Secondary | ICD-10-CM | POA: Diagnosis not present

## 2023-07-06 ENCOUNTER — Other Ambulatory Visit: Payer: Self-pay | Admitting: Nurse Practitioner

## 2023-07-06 DIAGNOSIS — E1165 Type 2 diabetes mellitus with hyperglycemia: Secondary | ICD-10-CM

## 2023-07-08 NOTE — Telephone Encounter (Signed)
Requested Prescriptions  Pending Prescriptions Disp Refills   metFORMIN (GLUCOPHAGE) 500 MG tablet [Pharmacy Med Name: metFORMIN HCl Oral Tablet 500 MG] 360 tablet 0    Sig: TAKE 2 TABLETS TWICE DAILY     Endocrinology:  Diabetes - Biguanides Failed - 07/06/2023  3:15 AM      Failed - Cr in normal range and within 360 days    Creatinine, Ser  Date Value Ref Range Status  06/17/2023 1.74 (H) 0.76 - 1.27 mg/dL Final         Failed - eGFR in normal range and within 360 days    GFR calc Af Amer  Date Value Ref Range Status  08/18/2020 62 >59 mL/min/1.73 Final    Comment:    **In accordance with recommendations from the NKF-ASN Task force,**   [facility] is in the process of updating its eGFR calculation to the   2021 CKD-EPI creatinine equation that estimates kidney function   without a race variable.    GFR, Estimated  Date Value Ref Range Status  09/28/2022 >60 >60 mL/min Final    Comment:    (NOTE) Calculated using the CKD-EPI Creatinine Equation (2021)    eGFR  Date Value Ref Range Status  06/17/2023 39 (L) >59 mL/min/1.73 Final         Passed - HBA1C is between 0 and 7.9 and within 180 days    HB A1C (BAYER DCA - WAIVED)  Date Value Ref Range Status  09/27/2021 7.4 (H) 4.8 - 5.6 % Final    Comment:             Prediabetes: 5.7 - 6.4          Diabetes: >6.4          Glycemic control for adults with diabetes: <7.0    Hgb A1c MFr Bld  Date Value Ref Range Status  06/17/2023 6.0 (H) 4.8 - 5.6 % Final    Comment:             Prediabetes: 5.7 - 6.4          Diabetes: >6.4          Glycemic control for adults with diabetes: <7.0          Passed - B12 Level in normal range and within 720 days    Vitamin B-12  Date Value Ref Range Status  09/27/2021 263 232 - 1,245 pg/mL Final         Passed - Valid encounter within last 6 months    Recent Outpatient Visits           3 weeks ago PVD (peripheral vascular disease) (HCC)   [facility]  Larae Grooms, NP   4 months ago Hypertension associated with diabetes [facility])   [facility] Larae Grooms, NP   7 months ago Hypertension associated with diabetes [facility])   [facility] Larae Grooms, NP   8 months ago Type 2 diabetes mellitus with hyperglycemia, without long-term current use of insulin [facility])   [facility] Larae Grooms, NP   9 months ago Hospital discharge follow-up    [facility] Larae Grooms, NP       Future Appointments             In 2 months Larae Grooms, NP  [facility]            Passed - CBC within  normal limits and completed in the last 12 months    WBC  Date Value Ref Range Status  10/10/2022 5.2 3.4 - 10.8 x10E3/uL Final  09/26/2022 6.0 4.0 - 10.5 K/uL Final   RBC  Date Value Ref Range Status  10/10/2022 4.12 (L) 4.14 - 5.80 x10E6/uL Final  09/26/2022 4.15 (L) 4.22 - 5.81 MIL/uL Final   Hemoglobin  Date Value Ref Range Status  10/10/2022 12.3 (L) 13.0 - 17.7 g/dL Final   Hematocrit  Date Value Ref Range Status  10/10/2022 37.6 37.5 - 51.0 % Final   MCHC  Date Value Ref Range Status  10/10/2022 32.7 31.5 - 35.7 g/dL Final  30/86/5784 69.6 30.0 - 36.0 g/dL Final   [facility]  Date Value Ref Range Status  10/10/2022 29.9 26.6 - 33.0 pg Final  09/26/2022 30.6 26.0 - 34.0 pg Final   MCV  Date Value Ref Range Status  10/10/2022 91 79 - 97 fL Final   No results found for: "PLTCOUNTKUC", "LABPLAT", "POCPLA" RDW  Date Value Ref Range Status  10/10/2022 13.1 11.6 - 15.4 % Final

## 2023-07-18 DIAGNOSIS — M25551 Pain in right hip: Secondary | ICD-10-CM | POA: Diagnosis not present

## 2023-07-18 DIAGNOSIS — M7061 Trochanteric bursitis, right hip: Secondary | ICD-10-CM | POA: Diagnosis not present

## 2023-07-31 DIAGNOSIS — N400 Enlarged prostate without lower urinary tract symptoms: Secondary | ICD-10-CM | POA: Diagnosis not present

## 2023-07-31 DIAGNOSIS — I10 Essential (primary) hypertension: Secondary | ICD-10-CM | POA: Diagnosis not present

## 2023-07-31 DIAGNOSIS — D631 Anemia in chronic kidney disease: Secondary | ICD-10-CM | POA: Diagnosis not present

## 2023-07-31 DIAGNOSIS — N1832 Chronic kidney disease, stage 3b: Secondary | ICD-10-CM | POA: Diagnosis not present

## 2023-07-31 DIAGNOSIS — E1122 Type 2 diabetes mellitus with diabetic chronic kidney disease: Secondary | ICD-10-CM | POA: Diagnosis not present

## 2023-07-31 DIAGNOSIS — N2581 Secondary hyperparathyroidism of renal origin: Secondary | ICD-10-CM | POA: Diagnosis not present

## 2023-09-17 ENCOUNTER — Ambulatory Visit (INDEPENDENT_AMBULATORY_CARE_PROVIDER_SITE_OTHER): Payer: Medicare HMO | Admitting: Nurse Practitioner

## 2023-09-17 ENCOUNTER — Encounter: Payer: Self-pay | Admitting: Nurse Practitioner

## 2023-09-17 VITALS — BP 138/82 | HR 96 | Temp 98.5°F | Wt 175.6 lb

## 2023-09-17 DIAGNOSIS — E1165 Type 2 diabetes mellitus with hyperglycemia: Secondary | ICD-10-CM

## 2023-09-17 DIAGNOSIS — I2510 Atherosclerotic heart disease of native coronary artery without angina pectoris: Secondary | ICD-10-CM | POA: Diagnosis not present

## 2023-09-17 DIAGNOSIS — E119 Type 2 diabetes mellitus without complications: Secondary | ICD-10-CM | POA: Diagnosis not present

## 2023-09-17 DIAGNOSIS — Z794 Long term (current) use of insulin: Secondary | ICD-10-CM

## 2023-09-17 DIAGNOSIS — E1169 Type 2 diabetes mellitus with other specified complication: Secondary | ICD-10-CM | POA: Diagnosis not present

## 2023-09-17 DIAGNOSIS — Z7984 Long term (current) use of oral hypoglycemic drugs: Secondary | ICD-10-CM

## 2023-09-17 DIAGNOSIS — E785 Hyperlipidemia, unspecified: Secondary | ICD-10-CM | POA: Diagnosis not present

## 2023-09-17 DIAGNOSIS — E1159 Type 2 diabetes mellitus with other circulatory complications: Secondary | ICD-10-CM | POA: Diagnosis not present

## 2023-09-17 DIAGNOSIS — I152 Hypertension secondary to endocrine disorders: Secondary | ICD-10-CM | POA: Diagnosis not present

## 2023-09-17 DIAGNOSIS — G479 Sleep disorder, unspecified: Secondary | ICD-10-CM | POA: Diagnosis not present

## 2023-09-17 DIAGNOSIS — I739 Peripheral vascular disease, unspecified: Secondary | ICD-10-CM | POA: Diagnosis not present

## 2023-09-17 DIAGNOSIS — I2583 Coronary atherosclerosis due to lipid rich plaque: Secondary | ICD-10-CM

## 2023-09-17 MED ORDER — MIRTAZAPINE 7.5 MG PO TABS
7.5000 mg | ORAL_TABLET | Freq: Every day | ORAL | 1 refills | Status: DC
Start: 2023-09-17 — End: 2024-04-30

## 2023-09-17 NOTE — Assessment & Plan Note (Signed)
 Chronic.  Controlled.  Family checks sugars but patient forgot book for appointment.  Last A1c in October was 6.0%.  If A1c elevated at next visit will add SGLT2.  Follow up in 3 month.  Call sooner if concerns arise.

## 2023-09-17 NOTE — Assessment & Plan Note (Signed)
Chronic.  Controlled.  Continue with current medication regimen of Amlodipine, benzepril and Furosemide.  Return to clinic in 3 months for reevaluation.  Labs ordered today.  Call sooner if concerns arise.

## 2023-09-17 NOTE — Assessment & Plan Note (Signed)
 Chronic. Stable.  No chest pain at visit today.  Continue with Statin therapy.  Continue to follow up with Cardiology.

## 2023-09-17 NOTE — Assessment & Plan Note (Signed)
Chronic.  Controlled.  Continue with current medication regimen on Pravastatin 40mg  daily.  Labs ordered at visit today.  Follow up in 3 months. Call sooner if concerns arise.

## 2023-09-17 NOTE — Progress Notes (Addendum)
 BP 138/82   Pulse 96   Temp 98.5 F (36.9 C) (Oral)   Wt 175 lb 9.6 oz (79.7 kg)   SpO2 97%   BMI 25.20 kg/m    Subjective:    Patient ID: Ryan Kirby, male    DOB: Oct 11, 1940, 83 y.o.   MRN: [id number]  HPI: Ryan Kirby is a 83 y.o. male  Chief Complaint  Patient presents with  . Hypertension  . Hyperlipidemia  . Diabetes  . 3 month follow up  . Insomnia  . Joint Pain    Would like to discuss Omega XL   HYPERTENSION / HYPERLIPIDEMIA Satisfied with current treatment? no Duration of hypertension: years BP monitoring frequency: not checking BP range:  BP medication side effects: no Past BP meds: amlodipine  and benazepril /HCTZ Duration of hyperlipidemia: years Cholesterol medication side effects: no Cholesterol supplements: none Past cholesterol medications: pravastatin  (pravachol ) Medication compliance: excellent compliance Aspirin : no Recent stressors: no Recurrent headaches: no Visual changes: no Palpitations: no Dyspnea: no Chest pain: no Lower extremity edema: no Dizzy/lightheaded: no  DIABETES Hypoglycemic episodes:no Polydipsia/polyuria: no Visual disturbance: no Chest pain: no Paresthesias: no Glucose Monitoring: no  Accucheck frequency: checking but didn't bring his log  Fasting glucose:  Post prandial:   Evening:  Before meals: Taking Insulin ?: no  Long acting insulin :  Short acting insulin : Blood Pressure Monitoring: not checking Retinal Examination: Up to Date- has his eye exam scheduled for the end of the month Foot Exam: Up to Date Diabetic Education: Not Completed Pneumovax: Up to Date Influenza: Up to Date Aspirin : yes  Patient states he is having trouble resting.  He isn't able to sleep at night.  Having trouble relaxing.     Relevant past medical, surgical, family and social history reviewed and updated as indicated. Interim medical history since our last visit reviewed. Allergies and medications reviewed and  updated.  Review of Systems  Eyes:  Negative for visual disturbance.  Respiratory:  Negative for chest tightness and shortness of breath.   Cardiovascular:  Negative for chest pain, palpitations and leg swelling.  Endocrine: Negative for polydipsia and polyuria.  Musculoskeletal:        Joint pain  Neurological:  Negative for dizziness, light-headedness, numbness and headaches.  Psychiatric/Behavioral:  Positive for sleep disturbance.     Per HPI unless specifically indicated above     Objective:    BP 138/82   Pulse 96   Temp 98.5 F (36.9 C) (Oral)   Wt 175 lb 9.6 oz (79.7 kg)   SpO2 97%   BMI 25.20 kg/m   Wt Readings from Last 3 Encounters:  09/17/23 175 lb 9.6 oz (79.7 kg)  06/17/23 164 lb 6.4 oz (74.6 kg)  04/23/23 165 lb (74.8 kg)    Physical Exam Vitals and nursing note reviewed.  Constitutional:      General: He is not in acute distress.    Appearance: Normal appearance. He is not ill-appearing, toxic-appearing or diaphoretic.  HENT:     Head: Normocephalic.     Right Ear: External ear normal.     Left Ear: External ear normal.     Nose: Nose normal. No congestion or rhinorrhea.     Mouth/Throat:     Mouth: Mucous membranes are moist.  Eyes:     General:        Right eye: No discharge.        Left eye: No discharge.     Extraocular Movements: Extraocular movements intact.  Conjunctiva/sclera: Conjunctivae normal.     Pupils: Pupils are equal, round, and reactive to light.  Cardiovascular:     Rate and Rhythm: Normal rate and regular rhythm.     Heart sounds: No murmur heard. Pulmonary:     Effort: Pulmonary effort is normal. No respiratory distress.     Breath sounds: Normal breath sounds. No wheezing, rhonchi or rales.  Abdominal:     General: Abdomen is flat. Bowel sounds are normal.  Musculoskeletal:     Cervical back: Normal range of motion and neck supple.  Skin:    General: Skin is warm and dry.     Capillary Refill: Capillary refill  takes less than 2 seconds.  Neurological:     General: No focal deficit present.     Mental Status: He is alert and oriented to person, place, and time.  Psychiatric:        Mood and Affect: Mood normal.        Behavior: Behavior normal.        Thought Content: Thought content normal.        Judgment: Judgment normal.    Results for orders placed or performed in visit on 06/17/23  Comp Met (CMET)   Collection Time: 06/17/23  1:32 PM  Result Value Ref Range   Glucose 239 (H) 70 - 99 mg/dL   BUN 20 8 - 27 mg/dL   Creatinine, Ser 8.25 (H) 0.76 - 1.27 mg/dL   eGFR 39 (L) >40 fO/fpw/8.26   BUN/Creatinine Ratio 11 10 - 24   Sodium 141 134 - 144 mmol/L   Potassium 3.9 3.5 - 5.2 mmol/L   Chloride 102 96 - 106 mmol/L   CO2 21 20 - 29 mmol/L   Calcium  10.3 (H) 8.6 - 10.2 mg/dL   Total Protein 6.6 6.0 - 8.5 g/dL   Albumin 4.4 3.7 - 4.7 g/dL   Globulin, Total 2.2 1.5 - 4.5 g/dL   Bilirubin Total 0.3 0.0 - 1.2 mg/dL   Alkaline Phosphatase 116 44 - 121 IU/L   AST 16 0 - 40 IU/L   ALT 8 0 - 44 IU/L  Lipid Profile   Collection Time: 06/17/23  1:32 PM  Result Value Ref Range   Cholesterol, Total 132 100 - 199 mg/dL   Triglycerides 89 0 - 149 mg/dL   HDL 60 >60 mg/dL   VLDL Cholesterol Cal 17 5 - 40 mg/dL   LDL Chol Calc (NIH) 55 0 - 99 mg/dL   Chol/HDL Ratio 2.2 0.0 - 5.0 ratio  HgB A1c   Collection Time: 06/17/23  1:32 PM  Result Value Ref Range   Hgb A1c MFr Bld 6.0 (H) 4.8 - 5.6 %   Est. average glucose Bld gHb Est-mCnc 126 mg/dL      Assessment & Plan:   Problem List Items Addressed This Visit       Cardiovascular and Mediastinum   Hypertension associated with diabetes (HCC)   Chronic.  Controlled.  Continue with current medication regimen of Amlodipine , benzepril and Furosemide .  Return to clinic in 3 months for reevaluation.  Labs ordered today.  Call sooner if concerns arise.       Relevant Orders   Comp Met (CMET)   CAD (coronary artery disease)   Chronic. Stable.   No chest pain at visit today.  Continue with Statin therapy.  Continue to follow up with Cardiology.      PVD (peripheral vascular disease) (HCC)   Chronic.  Controlled.  Continue  with current medication regimen.  Labs ordered today.  Return to clinic in 6 months for reevaluation.  Call sooner if concerns arise.         Endocrine   Hyperlipidemia associated with type 2 diabetes mellitus (HCC) - Primary   Chronic.  Controlled.  Continue with current medication regimen on Pravastatin  40mg  daily.  Labs ordered at visit today.  Follow up in 3 months. Call sooner if concerns arise.       Relevant Orders   Lipid Profile   Diabetes mellitus treated with insulin  and oral medication (HCC)   Chronic.  Controlled.  Family checks sugars but patient forgot book for appointment.  Last A1c in October was 6.0%.  If A1c elevated at next visit will add SGLT2.  Follow up in 3 month.  Call sooner if concerns arise.       Other Visit Diagnoses       Difficulty sleeping       Will start Mirtazipine nightly to help with sleep.  Side effects and benefits of medication discussed. Follow up in 3 months.  Call sooner if concerns arise.        Follow up plan: Return in about 3 months (around 12/16/2023) for HTN, HLD, DM2 FU.

## 2023-09-17 NOTE — Assessment & Plan Note (Signed)
 Chronic.  Controlled.  Continue with current medication regimen.  Labs ordered today.  Return to clinic in 6 months for reevaluation.  Call sooner if concerns arise.  ? ?

## 2023-09-18 LAB — HEMOGLOBIN A1C
Est. average glucose Bld gHb Est-mCnc: 137 mg/dL
Hgb A1c MFr Bld: 6.4 % — ABNORMAL HIGH (ref 4.8–5.6)

## 2023-09-18 LAB — COMPREHENSIVE METABOLIC PANEL
ALT: 9 [IU]/L (ref 0–44)
AST: 18 [IU]/L (ref 0–40)
Albumin: 4.4 g/dL (ref 3.7–4.7)
Alkaline Phosphatase: 108 [IU]/L (ref 44–121)
BUN/Creatinine Ratio: 10 (ref 10–24)
BUN: 17 mg/dL (ref 8–27)
Bilirubin Total: 0.3 mg/dL (ref 0.0–1.2)
CO2: 22 mmol/L (ref 20–29)
Calcium: 10.3 mg/dL — ABNORMAL HIGH (ref 8.6–10.2)
Chloride: 103 mmol/L (ref 96–106)
Creatinine, Ser: 1.64 mg/dL — ABNORMAL HIGH (ref 0.76–1.27)
Globulin, Total: 2.5 g/dL (ref 1.5–4.5)
Glucose: 140 mg/dL — ABNORMAL HIGH (ref 70–99)
Potassium: 4 mmol/L (ref 3.5–5.2)
Sodium: 142 mmol/L (ref 134–144)
Total Protein: 6.9 g/dL (ref 6.0–8.5)
eGFR: 42 mL/min/{1.73_m2} — ABNORMAL LOW (ref >59)

## 2023-09-18 LAB — LIPID PANEL
Chol/HDL Ratio: 2.2 {ratio} (ref 0.0–5.0)
Cholesterol, Total: 137 mg/dL (ref 100–199)
HDL: 63 mg/dL (ref >39)
LDL Chol Calc (NIH): 57 mg/dL (ref 0–99)
Triglycerides: 94 mg/dL (ref 0–149)
VLDL Cholesterol Cal: 17 mg/dL (ref 5–40)

## 2023-09-19 ENCOUNTER — Other Ambulatory Visit: Payer: Self-pay | Admitting: Nurse Practitioner

## 2023-09-19 DIAGNOSIS — E1165 Type 2 diabetes mellitus with hyperglycemia: Secondary | ICD-10-CM

## 2023-09-19 NOTE — Telephone Encounter (Signed)
Requested Prescriptions  Pending Prescriptions Disp Refills   metFORMIN (GLUCOPHAGE) 500 MG tablet [Pharmacy Med Name: metFORMIN HCl Oral Tablet 500 MG] 360 tablet 0    Sig: TAKE 2 TABLETS TWICE DAILY     Endocrinology:  Diabetes - Biguanides Failed - 09/19/2023 11:41 AM      Failed - Cr in normal range and within 360 days    Creatinine, Ser  Date Value Ref Range Status  09/17/2023 1.64 (H) 0.76 - 1.27 mg/dL Final         Failed - eGFR in normal range and within 360 days    GFR calc Af Amer  Date Value Ref Range Status  08/18/2020 62 >59 mL/min/1.73 Final    Comment:    **In accordance with recommendations from the NKF-ASN Task force,**   [facility] is in the process of updating its eGFR calculation to the   2021 CKD-EPI creatinine equation that estimates kidney function   without a race variable.    GFR, Estimated  Date Value Ref Range Status  09/28/2022 >60 >60 mL/min Final    Comment:    (NOTE) Calculated using the CKD-EPI Creatinine Equation (2021)    eGFR  Date Value Ref Range Status  09/17/2023 42 (L) >59 mL/min/1.73 Final         Failed - B12 Level in normal range and within 720 days    Vitamin B-12  Date Value Ref Range Status  09/27/2021 263 232 - 1,245 pg/mL Final         Passed - HBA1C is between 0 and 7.9 and within 180 days    HB A1C (BAYER DCA - WAIVED)  Date Value Ref Range Status  09/27/2021 7.4 (H) 4.8 - 5.6 % Final    Comment:             Prediabetes: 5.7 - 6.4          Diabetes: >6.4          Glycemic control for adults with diabetes: <7.0    Hgb A1c MFr Bld  Date Value Ref Range Status  09/17/2023 6.4 (H) 4.8 - 5.6 % Final    Comment:             Prediabetes: 5.7 - 6.4          Diabetes: >6.4          Glycemic control for adults with diabetes: <7.0          Passed - Valid encounter within last 6 months    Recent Outpatient Visits           2 days ago Hyperlipidemia associated with type 2 diabetes mellitus (HCC)   [place] [facility]   3 months ago PVD (peripheral vascular disease) [facility])   [place] [facility]   7 months ago Hypertension associated with diabetes [facility])   [place] [facility], NP   9 months ago Hypertension associated with diabetes [facility]   [place] [facility] G[facility], NP   10 months ago Type 2 diabetes mellitus with hyperglycemia, without long-term current use of insulin [facility])   [place] [facility] Larae Grooms, NP       Future Appointments             In 3 months Larae Grooms, NP [facility]  Passed - CBC within normal limits and completed in the last 12 months    WBC  Date Value Ref Range Status  10/10/2022 5.2 3.4 - 10.8 x10E3/uL Final  09/26/2022 6.0 4.0 - 10.5 K/uL Final   RBC  Date Value Ref Range Status  10/10/2022 4.12 (L) 4.14 - 5.80 x10E6/uL Final  09/26/2022 4.15 (L) 4.22 - 5.81 MIL/uL Final   Hemoglobin  Date Value Ref Range Status  10/10/2022 12.3 (L) 13.0 - 17.7 g/dL Final   Hematocrit  Date Value Ref Range Status  10/10/2022 37.6 37.5 - 51.0 % Final   MCHC  Date Value Ref Range Status  10/10/2022 32.7 31.5 - 35.7 g/dL Final  13/04/6577 46.9 30.0 - 36.0 g/dL Final   [facility]  Date Value Ref Range Status  10/10/2022 29.9 26.6 - 33.0 pg Final  09/26/2022 30.6 26.0 - 34.0 pg Final   MCV  Date Value Ref Range Status  10/10/2022 91 79 - 97 fL Final   No results found for: "PLTCOUNTKUC", "LABPLAT", "POCPLA" RDW  Date Value Ref Range Status  10/10/2022 13.1 11.6 - 15.4 % Final          potassium chloride SA (KLOR-CON M) 20 MEQ tablet [Pharmacy Med Name: Potassium Chloride Crys ER Oral Tablet Extended Release 20 MEQ] 90 tablet 0    Sig: TAKE 1 TABLET EVERY DAY     Endocrinology:  Minerals - Potassium Supplementation Failed - 09/19/2023 11:41 AM      Failed - Cr in  normal range and within 360 days    Creatinine, Ser  Date Value Ref Range Status  09/17/2023 1.64 (H) 0.76 - 1.27 mg/dL Final         Passed - K in normal range and within 360 days    Potassium  Date Value Ref Range Status  09/17/2023 4.0 3.5 - 5.2 mmol/L Final         Passed - Valid encounter within last 12 months    Recent Outpatient Visits           2 days ago Hyperlipidemia associated with type 2 diabetes mellitus (HCC)   [place] [facility]   3 months ago PVD (peripheral vascular disease) [facility])   [place] [facility], NP   7 months ago Hypertension associated with diabetes [facility])   [place] [facility] Grooms, NP   9 months ago Hypertension associated with diabetes [facility])   [place] [facility] Larae Grooms, NP   10 months ago Type 2 diabetes mellitus with hyperglycemia, without long-term current use of insulin (HCC)   [place] [facility] Larae Grooms, NP       Future Appointments             In 3 months Larae Grooms, NP [place] [name], [facility]             furosemide (LASIX) 40 MG tablet [Pharmacy Med Name: Furosemide Oral Tablet 40 MG] 90 tablet 0    Sig: TAKE 1 TABLET EVERY DAY     Cardiovascular:  Diuretics - Loop Failed - 09/19/2023 11:41 AM      Failed - Ca in normal range and within 180 days    Calcium  Date Value Ref Range Status  09/17/2023 10.3 (H) 8.6 - 10.2 mg/dL Final   Calcium, Ion  Date Value Ref Range Status  06/01/2020 1.31 1.15 - 1.40 mmol/L Final  Failed - Cr in normal range and within 180 days    Creatinine, Ser  Date Value Ref Range Status  09/17/2023 1.64 (H) 0.76 - 1.27 mg/dL Final         Failed - Mg Level in normal range and within 180 days    Magnesium  Date Value Ref Range Status  10/10/2022 1.4 (L) 1.6 - 2.3 mg/dL Final         Passed - K in normal  range and within 180 days    Potassium  Date Value Ref Range Status  09/17/2023 4.0 3.5 - 5.2 mmol/L Final         Passed - Na in normal range and within 180 days    Sodium  Date Value Ref Range Status  09/17/2023 142 134 - 144 mmol/L Final         Passed - Cl in normal range and within 180 days    Chloride  Date Value Ref Range Status  09/17/2023 103 96 - 106 mmol/L Final         Passed - Last BP in normal range    BP Readings from Last 1 Encounters:  09/17/23 138/82         Passed - Valid encounter within last 6 months    Recent Outpatient Visits           2 days ago Hyperlipidemia associated with type 2 diabetes mellitus (HCC)   [place] [facility] NP   3 months ago PVD (peripheral vascular disease) [facility])   [place] [facility] Larae Grooms, NP   7 months ago Hypertension associated with diabetes [facility])   [place] [facility] Larae Grooms, NP   9 months ago Hypertension associated with diabetes [facility])   [place] [facility] Larae Grooms, NP   10 months ago Type 2 diabetes mellitus with hyperglycemia, without long-term current use of insulin (HCC)   [place] [facility] Larae Grooms, NP       Future Appointments             In 3 months Larae Grooms, NP [place] [name], [facility]             omeprazole (PRILOSEC) 20 MG capsule [Pharmacy Med Name: Omeprazole Oral Capsule Delayed Release 20 MG] 90 capsule 0    Sig: TAKE 1 CAPSULE EVERY DAY AS NEEDED (GERD SYMPTOMS)     Gastroenterology: Proton Pump Inhibitors Passed - 09/19/2023 11:41 AM      Passed - Valid encounter within last 12 months    Recent Outpatient Visits           2 days ago Hyperlipidemia associated with type 2 diabetes mellitus (HCC)   [place] [facility] Larae Grooms, NP   3 months ago PVD (peripheral vascular disease) [facility])   [facility]rooms, NP   7 months ago Hypertension associated with diabetes [facility])   [place] [facility] Larae Grooms, NP   9 months ago Hypertension associated with diabetes [facility])   [place] [facility] Larae Grooms, NP   10 months ago Type 2 diabetes mellitus with hyperglycemia, without long-term current use of insulin [facility])   [place] [facility] Larae Grooms, NP       Future Appointments             In 3 months Larae Grooms, NP [facility] [name], [facility]             doxazosin (CARDURA) 2 MG tablet [Pharmacy Med Name: Doxazosin Mesylate Oral Tablet 2 MG] 90 tablet 0    Sig: TAKE 1 TABLET EVERY MORNING     Cardiovascular:  Alpha Blockers Passed - 09/19/2023 11:41 AM      Passed - Last BP in normal range    BP Readings from Last 1 Encounters:  09/17/23 138/82         Passed - Valid encounter within last 6 months    Recent Outpatient Visits           2 days ago Hyperlipidemia associated with type 2 diabetes mellitus (HCC)   [place] [facility], NP   3 months ago PVD (peripheral vascular disease) [facility])   [place] [facility] Larae Grooms, NP   7 months ago Hypertension associated with diabetes [facility])   [place] [facility] Lar[facility] Grooms, NP   9 months ago Hypertension associated with diabetes [facility])   [place] [facility] Larae Grooms, NP   10 months ago Type 2 diabetes mellitus with hyperglycemia, without long-term current use of insulin [facility])   [place] [facility] Lar[facility] Grooms, NP       Future Appointments             In 3 months Larae Grooms, NP  [facility], [facility]

## 2023-09-26 ENCOUNTER — Other Ambulatory Visit: Payer: Self-pay | Admitting: Nurse Practitioner

## 2023-09-26 DIAGNOSIS — E1165 Type 2 diabetes mellitus with hyperglycemia: Secondary | ICD-10-CM

## 2023-09-26 NOTE — Telephone Encounter (Signed)
Requested Prescriptions  Pending Prescriptions Disp Refills   amLODipine (NORVASC) 10 MG tablet [Pharmacy Med Name: amLODIPine Besylate Oral Tablet 10 MG] 90 tablet 0    Sig: TAKE 1 TABLET EVERY DAY     Cardiovascular: Calcium Channel Blockers 2 Passed - 09/26/2023 10:24 AM      Passed - Last BP in normal range    BP Readings from Last 1 Encounters:  09/17/23 138/82         Passed - Last Heart Rate in normal range    Pulse Readings from Last 1 Encounters:  09/17/23 96         Passed - Valid encounter within last 6 months    Recent Outpatient Visits           1 week ago Hyperlipidemia associated with type 2 diabetes mellitus (HCC)   [place] [facility] Larae Grooms, NP   3 months ago PVD (peripheral vascular disease) [facility])   [place] [facility] Larae Grooms, NP   7 months ago Hypertension associated with diabetes [facility])   [place] [facility] Larae Grooms, NP   9 months ago Hypertension associated with diabetes [facility])   [place] [facility] Larae Grooms, NP   10 months ago Type 2 diabetes mellitus with hyperglycemia, without long-term current use of insulin [facility])   [place] [facility] Larae Grooms, NP       Future Appointments             In 2 months Larae Grooms, NP [place] [facility], [facility]

## 2023-12-01 ENCOUNTER — Other Ambulatory Visit: Payer: Self-pay | Admitting: Nurse Practitioner

## 2023-12-01 DIAGNOSIS — E1165 Type 2 diabetes mellitus with hyperglycemia: Secondary | ICD-10-CM

## 2023-12-03 NOTE — Telephone Encounter (Signed)
 OV 09/17/23 PCP aware on abnormal lab values Requested Prescriptions  Pending Prescriptions Disp Refills   potassium chloride SA (KLOR-CON M) 20 MEQ tablet [Pharmacy Med Name: Potassium Chloride Crys ER Oral Tablet Extended Release 20 MEQ] 90 tablet 3    Sig: TAKE 1 TABLET EVERY DAY     Endocrinology:  Minerals - Potassium Supplementation Failed - 12/03/2023  3:30 PM      Failed - Cr in normal range and within 360 days    Creatinine, Ser  Date Value Ref Range Status  09/17/2023 1.64 (H) 0.76 - 1.27 mg/dL Final         Failed - Valid encounter within last 12 months    Recent Outpatient Visits   None     Future Appointments             In 2 weeks Larae Grooms, NP [facility]            Passed - K in normal range and within 360 days    Potassium  Date Value Ref Range Status  09/17/2023 4.0 3.5 - 5.2 mmol/L Final          omeprazole (PRILOSEC) 20 MG capsule [Pharmacy Med Name: Omeprazole Oral Capsule Delayed Release 20 MG] 90 capsule 3    Sig: TAKE 1 CAPSULE EVERY DAY AS NEEDED (GERD SYMPTOMS)     Gastroenterology: Proton Pump Inhibitors Failed - 12/03/2023  3:30 PM      Failed - Valid encounter within last 12 months    Recent Outpatient Visits   None     Future Appointments             In 2 weeks Larae Grooms, NP [place] [name], [facility]             furosemide (LASIX) 40 MG tablet [Pharmacy Med Name: Furosemide Oral Tablet 40 MG] 90 tablet 3    Sig: TAKE 1 TABLET EVERY DAY     Cardiovascular:  Diuretics - Loop Failed - 12/03/2023  3:30 PM      Failed - Ca in normal range and within 180 days    Calcium  Date Value Ref Range Status  09/17/2023 10.3 (H) 8.6 - 10.2 mg/dL Final   Calcium, Ion  Date Value Ref Range Status  06/01/2020 1.31 1.15 - 1.40 mmol/L Final         Failed - Cr in normal range and within 180 days    Creatinine, Ser  Date Value Ref Range Status  09/17/2023 1.64 (H) 0.76 - 1.27 mg/dL  Final         Failed - Mg Level in normal range and within 180 days    Magnesium  Date Value Ref Range Status  10/10/2022 1.4 (L) 1.6 - 2.3 mg/dL Final         Failed - Valid encounter within last 6 months    Recent Outpatient Visits   None     Future Appointments             In 2 weeks Larae Grooms, NP [place] [facility], [facility]            Passed - K in normal range and within 180 days    Potassium  Date Value Ref Range Status  09/17/2023 4.0 3.5 - 5.2 mmol/L Final         Passed - Na in normal range and within 180 days    Sodium  Date  Value Ref Range Status  09/17/2023 142 134 - 144 mmol/L Final         Passed - Cl in normal range and within 180 days    Chloride  Date Value Ref Range Status  09/17/2023 103 96 - 106 mmol/L Final         Passed - Last BP in normal range    BP Readings from Last 1 Encounters:  09/17/23 138/82          doxazosin (CARDURA) 2 MG tablet [Pharmacy Med Name: Doxazosin Mesylate Oral Tablet 2 MG] 90 tablet 3    Sig: TAKE 1 TABLET EVERY MORNING     Cardiovascular:  Alpha Blockers Failed - 12/03/2023  3:30 PM      Failed - Valid encounter within last 6 months    Recent Outpatient Visits   None     Future Appointments             In 2 weeks Larae Grooms, NP [place] [name], [facility]            Passed - Last BP in normal range    BP Readings from Last 1 Encounters:  09/17/23 138/82          metFORMIN (GLUCOPHAGE) 500 MG tablet [Pharmacy Med Name: metFORMIN HCl Oral Tablet 500 MG] 360 tablet 3    Sig: TAKE 2 TABLETS TWICE DAILY     Endocrinology:  Diabetes - Biguanides Failed - 12/03/2023  3:30 PM      Failed - Cr in normal range and within 360 days    Creatinine, Ser  Date Value Ref Range Status  09/17/2023 1.64 (H) 0.76 - 1.27 mg/dL Final         Failed - eGFR in normal range and within 360 days    GFR calc Af Amer  Date Value Ref Range Status  08/18/2020 62 >59 mL/min/1.73  Final    Comment:    **In accordance with recommendations from the NKF-ASN Task force,**   [facility] is in the process of updating its eGFR calculation to the   2021 CKD-EPI creatinine equation that estimates kidney function   without a race variable.    GFR, Estimated  Date Value Ref Range Status  09/28/2022 >60 >60 mL/min Final    Comment:    (NOTE) Calculated using the CKD-EPI Creatinine Equation (2021)    eGFR  Date Value Ref Range Status  09/17/2023 42 (L) >59 mL/min/1.73 Final         Failed - B12 Level in normal range and within 720 days    Vitamin B-12  Date Value Ref Range Status  09/27/2021 263 232 - 1,245 pg/mL Final         Failed - Valid encounter within last 6 months    Recent Outpatient Visits   None     Future Appointments             In 2 weeks Larae Grooms, NP [facility]            Failed - CBC within normal limits and completed in the last 12 months    WBC  Date Value Ref Range Status  10/10/2022 5.2 3.4 - 10.8 x10E3/uL Final  09/26/2022 6.0 4.0 - 10.5 K/uL Final   RBC  Date Value Ref Range Status  10/10/2022 4.12 (L) 4.14 - 5.80 x10E6/uL Final  09/26/2022 4.15 (L) 4.22 - 5.81 MIL/uL Final   Hemoglobin  Date Value  Ref Range Status  10/10/2022 12.3 (L) 13.0 - 17.7 g/dL Final   Hematocrit  Date Value Ref Range Status  10/10/2022 37.6 37.5 - 51.0 % Final   MCHC  Date Value Ref Range Status  10/10/2022 32.7 31.5 - 35.7 g/dL Final  69/62/9528 41.3 30.0 - 36.0 g/dL Final   [facility]  Date Value Ref Range Status  10/10/2022 29.9 26.6 - 33.0 pg Final  09/26/2022 30.6 26.0 - 34.0 pg Final   MCV  Date Value Ref Range Status  10/10/2022 91 79 - 97 fL Final   No results found for: "PLTCOUNTKUC", "LABPLAT", "POCPLA" RDW  Date Value Ref Range Status  10/10/2022 13.1 11.6 - 15.4 % Final         Passed - HBA1C is between 0 and 7.9 and within 180 days    HB A1C (BAYER DCA - WAIVED)  Date Value Ref Range  Status  09/27/2021 7.4 (H) 4.8 - 5.6 % Final    Comment:             Prediabetes: 5.7 - 6.4          Diabetes: >6.4          Glycemic control for adults with diabetes: <7.0    Hgb A1c MFr Bld  Date Value Ref Range Status  09/17/2023 6.4 (H) 4.8 - 5.6 % Final    Comment:             Prediabetes: 5.7 - 6.4          Diabetes: >6.4          Glycemic control for adults with diabetes: <7.0

## 2023-12-08 ENCOUNTER — Other Ambulatory Visit: Payer: Self-pay | Admitting: Nurse Practitioner

## 2023-12-08 DIAGNOSIS — E1165 Type 2 diabetes mellitus with hyperglycemia: Secondary | ICD-10-CM

## 2023-12-10 NOTE — Telephone Encounter (Signed)
 Requested Prescriptions  Pending Prescriptions Disp Refills   amLODipine (NORVASC) 10 MG tablet [Pharmacy Med Name: amLODIPine Besylate Oral Tablet 10 MG] 90 tablet 1    Sig: TAKE 1 TABLET EVERY DAY     Cardiovascular: Calcium Channel Blockers 2 Failed - 12/10/2023  9:44 AM      Failed - Valid encounter within last 6 months    Recent Outpatient Visits   None     Future Appointments             In 1 week Larae Grooms, NP [facility]   In 1 month Stoioff, Verna Czech, MD [facility] [place]            Passed - Last BP in normal range    BP Readings from Last 1 Encounters:  09/17/23 138/82         Passed - Last Heart Rate in normal range    Pulse Readings from Last 1 Encounters:  09/17/23 96

## 2023-12-19 ENCOUNTER — Encounter: Payer: Self-pay | Admitting: Nurse Practitioner

## 2023-12-19 ENCOUNTER — Ambulatory Visit (INDEPENDENT_AMBULATORY_CARE_PROVIDER_SITE_OTHER): Payer: Self-pay | Admitting: Nurse Practitioner

## 2023-12-19 VITALS — BP 136/72 | HR 105 | Resp 17 | Ht 70.0 in | Wt 174.6 lb

## 2023-12-19 DIAGNOSIS — I2583 Coronary atherosclerosis due to lipid rich plaque: Secondary | ICD-10-CM

## 2023-12-19 DIAGNOSIS — I739 Peripheral vascular disease, unspecified: Secondary | ICD-10-CM

## 2023-12-19 DIAGNOSIS — E785 Hyperlipidemia, unspecified: Secondary | ICD-10-CM

## 2023-12-19 DIAGNOSIS — E1169 Type 2 diabetes mellitus with other specified complication: Secondary | ICD-10-CM

## 2023-12-19 DIAGNOSIS — E119 Type 2 diabetes mellitus without complications: Secondary | ICD-10-CM | POA: Diagnosis not present

## 2023-12-19 DIAGNOSIS — I2510 Atherosclerotic heart disease of native coronary artery without angina pectoris: Secondary | ICD-10-CM

## 2023-12-19 DIAGNOSIS — Z7984 Long term (current) use of oral hypoglycemic drugs: Secondary | ICD-10-CM

## 2023-12-19 DIAGNOSIS — Z794 Long term (current) use of insulin: Secondary | ICD-10-CM

## 2023-12-19 DIAGNOSIS — E1159 Type 2 diabetes mellitus with other circulatory complications: Secondary | ICD-10-CM | POA: Diagnosis not present

## 2023-12-19 DIAGNOSIS — I152 Hypertension secondary to endocrine disorders: Secondary | ICD-10-CM

## 2023-12-19 DIAGNOSIS — N1832 Chronic kidney disease, stage 3b: Secondary | ICD-10-CM | POA: Insufficient documentation

## 2023-12-19 LAB — MICROALBUMIN, URINE WAIVED
Creatinine, Urine Waived: 50 mg/dL (ref 10–300)
Microalb, Ur Waived: 150 mg/L — ABNORMAL HIGH (ref 0–19)
Microalb/Creat Ratio: >300 mg/g — ABNORMAL HIGH (ref <30)

## 2023-12-19 NOTE — Assessment & Plan Note (Signed)
Chronic.  Controlled.  Continue with current medication regimen of Amlodipine, benzepril and Furosemide.  Return to clinic in 3 months for reevaluation.  Labs ordered today.  Call sooner if concerns arise.

## 2023-12-19 NOTE — Assessment & Plan Note (Signed)
 Chronic.  Controlled.  Continue with current medication regimen.  Microalbumin today.  Followed by Dr. Rhesa Celeste. Labs ordered today.  Return to clinic in 6 months for reevaluation.  Call sooner if concerns arise.

## 2023-12-19 NOTE — Progress Notes (Signed)
 BP 136/72 Comment: manual blood pressure reading  Pulse (!) 105   Resp 17   Ht 5\' 10"  (1.778 m)   Wt 174 lb 9.6 oz (79.2 kg)   SpO2 99%   BMI 25.05 kg/m    Subjective:    Patient ID: Ryan Kirby, male    DOB: 02/12/1941, 83 y.o.   MRN: [id number]  HPI: Ryan Kirby is a 83 y.o. male  Chief Complaint  Patient presents with   Hypertension    No home checks   Hyperlipidemia   Gastroesophageal Reflux    No more then normal   Diabetes    Checks once weekly. Forgot his log book.    Knee Injury    Larey Seat 2 1/2 weeks ago and wearing a brace. Starting to resolve.    HYPERTENSION / HYPERLIPIDEMIA Satisfied with current treatment? no Duration of hypertension: years BP monitoring frequency: not checking BP range:  BP medication side effects: no Past BP meds: amlodipine and benazepril/HCTZ Duration of hyperlipidemia: years Cholesterol medication side effects: no Cholesterol supplements: none Past cholesterol medications: pravastatin (pravachol) Medication compliance: excellent compliance Aspirin: no Recent stressors: no Recurrent headaches: no Visual changes: no Palpitations: no Dyspnea: no Chest pain: no Lower extremity edema: no Dizzy/lightheaded: no  DIABETES Hypoglycemic episodes:no Polydipsia/polyuria: no Visual disturbance: no Chest pain: no Paresthesias: no Glucose Monitoring: no  Accucheck frequency: checking but didn't bring his log- his daughter checks 1x weekly in the evenings and they are running 150-200  Fasting glucose:  Post prandial:   Evening:  Before meals: Taking Insulin?: no  Long acting insulin:  Short acting insulin: Blood Pressure Monitoring: not checking Retinal Examination: Up to Date- has his eye exam scheduled for the end of the month Foot Exam: Up to Date Diabetic Education: Not Completed Pneumovax: Up to Date Influenza: Up to Date Aspirin: yes  Patient states he is having trouble resting.  He isn't able to sleep at night.   Having trouble relaxing.     Relevant past medical, surgical, family and social history reviewed and updated as indicated. Interim medical history since our last visit reviewed. Allergies and medications reviewed and updated.  Review of Systems  Eyes:  Negative for visual disturbance.  Respiratory:  Negative for chest tightness and shortness of breath.   Cardiovascular:  Negative for chest pain, palpitations and leg swelling.  Endocrine: Negative for polydipsia and polyuria.  Musculoskeletal:        Knee pain after fall  Neurological:  Negative for dizziness, light-headedness, numbness and headaches.    Per HPI unless specifically indicated above     Objective:    BP 136/72 Comment: manual blood pressure reading  Pulse (!) 105   Resp 17   Ht 5\' 10"  (1.778 m)   Wt 174 lb 9.6 oz (79.2 kg)   SpO2 99%   BMI 25.05 kg/m   Wt Readings from Last 3 Encounters:  12/19/23 174 lb 9.6 oz (79.2 kg)  09/17/23 175 lb 9.6 oz (79.7 kg)  06/17/23 164 lb 6.4 oz (74.6 kg)    Physical Exam Vitals and nursing note reviewed.  Constitutional:      General: He is not in acute distress.    Appearance: Normal appearance. He is not ill-appearing, toxic-appearing or diaphoretic.  HENT:     Head: Normocephalic.     Right Ear: External ear normal.     Left Ear: External ear normal.     Nose: Nose normal. No congestion or rhinorrhea.  Mouth/Throat:     Mouth: Mucous membranes are moist.  Eyes:     General:        Right eye: No discharge.        Left eye: No discharge.     Extraocular Movements: Extraocular movements intact.     Conjunctiva/sclera: Conjunctivae normal.     Pupils: Pupils are equal, round, and reactive to light.  Cardiovascular:     Rate and Rhythm: Normal rate and regular rhythm.     Heart sounds: No murmur heard. Pulmonary:     Effort: Pulmonary effort is normal. No respiratory distress.     Breath sounds: Normal breath sounds. No wheezing, rhonchi or rales.  Abdominal:      General: Abdomen is flat. Bowel sounds are normal.  Musculoskeletal:     Cervical back: Normal range of motion and neck supple.     Comments: Wearing a knee brace from Ortho  Skin:    General: Skin is warm and dry.     Capillary Refill: Capillary refill takes less than 2 seconds.  Neurological:     General: No focal deficit present.     Mental Status: He is alert and oriented to person, place, and time.  Psychiatric:        Mood and Affect: Mood normal.        Behavior: Behavior normal.        Thought Content: Thought content normal.        Judgment: Judgment normal.     Results for orders placed or performed in visit on 09/17/23  Comp Met (CMET)   Collection Time: 09/17/23 10:27 AM  Result Value Ref Range   Glucose 140 (H) 70 - 99 mg/dL   BUN 17 8 - 27 mg/dL   Creatinine, Ser 2.44 (H) 0.76 - 1.27 mg/dL   eGFR 42 (L) >01 UU/VOZ/3.66   BUN/Creatinine Ratio 10 10 - 24   Sodium 142 134 - 144 mmol/L   Potassium 4.0 3.5 - 5.2 mmol/L   Chloride 103 96 - 106 mmol/L   CO2 22 20 - 29 mmol/L   Calcium 10.3 (H) 8.6 - 10.2 mg/dL   Total Protein 6.9 6.0 - 8.5 g/dL   Albumin 4.4 3.7 - 4.7 g/dL   Globulin, Total 2.5 1.5 - 4.5 g/dL   Bilirubin Total 0.3 0.0 - 1.2 mg/dL   Alkaline Phosphatase 108 44 - 121 IU/L   AST 18 0 - 40 IU/L   ALT 9 0 - 44 IU/L  Lipid Profile   Collection Time: 09/17/23 10:27 AM  Result Value Ref Range   Cholesterol, Total 137 100 - 199 mg/dL   Triglycerides 94 0 - 149 mg/dL   HDL 63 >44 mg/dL   VLDL Cholesterol Cal 17 5 - 40 mg/dL   LDL Chol Calc (NIH) 57 0 - 99 mg/dL   Chol/HDL Ratio 2.2 0.0 - 5.0 ratio  HgB A1c   Collection Time: 09/17/23 10:27 AM  Result Value Ref Range   Hgb A1c MFr Bld 6.4 (H) 4.8 - 5.6 %   Est. average glucose Bld gHb Est-mCnc 137 mg/dL      Assessment & Plan:   Problem List Items Addressed This Visit       Cardiovascular and Mediastinum   Hypertension associated with diabetes (HCC)   Chronic.  Controlled.  Continue with  current medication regimen of Amlodipine, benzepril and Furosemide.  Return to clinic in 3 months for reevaluation.  Labs ordered today.  Call sooner if  concerns arise.       CAD (coronary artery disease)   Chronic. Stable.  No chest pain at visit today.  Continue with Statin and ASA therapy.  Continue to follow up with Cardiology.      PVD (peripheral vascular disease) (HCC)   Chronic.  Controlled.  Continue with current medication regimen.  Labs ordered today.  Return to clinic in 6 months for reevaluation.  Call sooner if concerns arise.         Endocrine   Hyperlipidemia associated with type 2 diabetes mellitus (HCC) - Primary   Chronic.  Controlled.  Continue with current medication regimen on Pravastatin 40mg  daily.  Labs ordered at visit today.  Follow up in 3 months. Call sooner if concerns arise.       Relevant Orders   Comprehensive metabolic panel with GFR   Lipid panel   Diabetes mellitus treated with insulin and oral medication (HCC)   Relevant Orders   Ambulatory referral to Ophthalmology   Microalbumin, Urine Waived   Hemoglobin A1c     Genitourinary   Stage 3b chronic kidney disease (HCC)   Chronic.  Controlled.  Continue with current medication regimen.  Microalbumin today.  Followed by Dr. Rhesa Celeste. Labs ordered today.  Return to clinic in 6 months for reevaluation.  Call sooner if concerns arise.          Follow up plan: No follow-ups on file.

## 2023-12-19 NOTE — Patient Instructions (Signed)
Ducolax

## 2023-12-19 NOTE — Assessment & Plan Note (Signed)
Chronic.  Controlled.  Continue with current medication regimen on Pravastatin 40mg  daily.  Labs ordered at visit today.  Follow up in 3 months. Call sooner if concerns arise.

## 2023-12-19 NOTE — Assessment & Plan Note (Signed)
 Chronic.  Controlled.  Continue with current medication regimen.  Labs ordered today.  Return to clinic in 6 months for reevaluation.  Call sooner if concerns arise.  ? ?

## 2023-12-19 NOTE — Assessment & Plan Note (Signed)
 Chronic. Stable.  No chest pain at visit today.  Continue with Statin and ASA therapy.  Continue to follow up with Cardiology.

## 2023-12-20 ENCOUNTER — Encounter: Payer: Self-pay | Admitting: Nurse Practitioner

## 2023-12-20 LAB — COMPREHENSIVE METABOLIC PANEL WITH GFR
ALT: 10 IU/L (ref 0–44)
AST: 19 IU/L (ref 0–40)
Albumin: 4.6 g/dL (ref 3.7–4.7)
Alkaline Phosphatase: 97 IU/L (ref 44–121)
BUN/Creatinine Ratio: 11 (ref 10–24)
BUN: 20 mg/dL (ref 8–27)
Bilirubin Total: 0.4 mg/dL (ref 0.0–1.2)
CO2: 25 mmol/L (ref 20–29)
Calcium: 10.8 mg/dL — ABNORMAL HIGH (ref 8.6–10.2)
Chloride: 100 mmol/L (ref 96–106)
Creatinine, Ser: 1.78 mg/dL — ABNORMAL HIGH (ref 0.76–1.27)
Globulin, Total: 2.4 g/dL (ref 1.5–4.5)
Glucose: 142 mg/dL — ABNORMAL HIGH (ref 70–99)
Potassium: 3.7 mmol/L (ref 3.5–5.2)
Sodium: 141 mmol/L (ref 134–144)
Total Protein: 7 g/dL (ref 6.0–8.5)
eGFR: 38 mL/min/{1.73_m2} — ABNORMAL LOW (ref >59)

## 2023-12-20 LAB — LIPID PANEL
Chol/HDL Ratio: 2.4 ratio (ref 0.0–5.0)
Cholesterol, Total: 152 mg/dL (ref 100–199)
HDL: 63 mg/dL (ref >39)
LDL Chol Calc (NIH): 72 mg/dL (ref 0–99)
Triglycerides: 91 mg/dL (ref 0–149)
VLDL Cholesterol Cal: 17 mg/dL (ref 5–40)

## 2023-12-20 LAB — HEMOGLOBIN A1C
Est. average glucose Bld gHb Est-mCnc: 131 mg/dL
Hgb A1c MFr Bld: 6.2 % — ABNORMAL HIGH (ref 4.8–5.6)

## 2024-01-22 ENCOUNTER — Ambulatory Visit (INDEPENDENT_AMBULATORY_CARE_PROVIDER_SITE_OTHER): Admitting: Urology

## 2024-01-22 ENCOUNTER — Encounter: Payer: Self-pay | Admitting: Urology

## 2024-01-22 VITALS — BP 176/69 | HR 108 | Ht 70.0 in | Wt 170.0 lb

## 2024-01-22 DIAGNOSIS — N400 Enlarged prostate without lower urinary tract symptoms: Secondary | ICD-10-CM | POA: Diagnosis not present

## 2024-01-22 DIAGNOSIS — N401 Enlarged prostate with lower urinary tract symptoms: Secondary | ICD-10-CM

## 2024-01-22 LAB — URINALYSIS, COMPLETE
Bilirubin, UA: NEGATIVE
Ketones, UA: NEGATIVE
Leukocytes,UA: NEGATIVE
Nitrite, UA: NEGATIVE
Specific Gravity, UA: 1.015 (ref 1.005–1.030)
Urobilinogen, Ur: 0.2 mg/dL (ref 0.2–1.0)
pH, UA: 6 (ref 5.0–7.5)

## 2024-01-22 LAB — MICROSCOPIC EXAMINATION

## 2024-01-22 LAB — BLADDER SCAN AMB NON-IMAGING

## 2024-01-22 NOTE — Progress Notes (Unsigned)
 I, [name], acting as a Neurosurgeon for Ryan Knapp, MD., have documented all relevant documentation on the behalf of Ryan Knapp, MD, as directed by Ryan Knapp, MD while in the presence of Ryan Knapp, MD.  Discussed the use of AI scribe software for clinical note transcription with the patient, who gave verbal consent to proceed.   01/22/2024 2:41 PM   Ryan Kirby [birth year] [id number]  Referring provider: [name], [name], MD [address] [place],  [place] [address]  Chief Complaint  Patient presents with   Benign Prostatic Hypertrophy    HPI: Ryan Kirby is a 83 y.o. male referred for evaluation of BPH.  Followed by nephrology for CKD stage 3B, and on a recent renal ultrasound was found to have prostate enlargement and bladder wall thickening. He did not have hydronephrosis.  I saw him in 2020 for an episode of urinary retention related to constipation and pain medication. He had a successful trial voiding once his catheter was removed and has not had a follow-up visit 1 week later, which showed a PVR of 55 mL.  He has no bothersome lower urinary tract symptoms.  Denies dysuria, gross hematuria. Denies flank, abdominal or pelvic pain.  He states his voiding symptoms are not bothersome enough that he desires medical management. He is taking doxazosin  2 mg daily prescribed by his PCP.   PMH: Past Medical History:  Diagnosis Date   Acute metabolic encephalopathy 09/23/2022   AKI (acute kidney injury) (HCC) 09/23/2022   Allergy    Arthritis    CAD (coronary artery disease)    Chronic kidney disease    pt is not on dialysis   Diabetes mellitus without complication (HCC)    GERD (gastroesophageal reflux disease)    Hypercalcemia 09/23/2022   Hyperlipidemia    Hypertension    Hypertensive urgency 09/23/2022   Hypokalemia 08/18/2020   Lactic acidosis 09/23/2022   Sinus bradycardia 09/24/2022    Surgical History: Past Surgical History:   Procedure Laterality Date   BOWEL RESECTION  2004   CATARACT EXTRACTION W/PHACO Right 12/24/2022   Procedure: CATARACT EXTRACTION PHACO AND INTRAOCULAR LENS PLACEMENT (IOC) RIGHT;  Surgeon: [name], MD;  Location: [facility];  Service: Ophthalmology;  Laterality: Right;  6.06 0:37.7   CATARACT EXTRACTION W/PHACO Left 01/07/2023   Procedure: CATARACT EXTRACTION PHACO AND INTRAOCULAR LENS PLACEMENT (IOC) LEFT;  Surgeon: [name], MD;  Location: [facility];  Service: Ophthalmology;  Laterality: Left;  5.63 0:36.7   CORONARY ARTERY BYPASS GRAFT     FINGER AMPUTATION Left 2011   5th   INGUINAL HERNIA REPAIR Right 06/01/2020   Procedure: HERNIA REPAIR INGUINAL ADULT, open;  Surgeon: [name], MD;  Location: [facility];  Service: General;  Laterality: Right;   KNEE ARTHROSCOPY Right 09/12/2015   Procedure: ARTHROSCOPY KNEE, LATERAL MENISECTOMY, CHONDROPLASTY;  Surgeon: [name], MD;  Location: [facility];  Service: Orthopedics;  Laterality: Right;   WISDOM TOOTH EXTRACTION      Home Medications:  Allergies as of 01/22/2024   No Known Allergies      Medication List        Accurate as of Jan 22, 2024  2:41 PM. If you have any questions, ask your nurse or doctor.          acetaminophen  500 MG tablet Commonly known as: TYLENOL  Take 500-1,000 mg by mouth every 6 (six) hours as needed for mild pain  or moderate pain.   amLODipine  10 MG tablet Commonly known as: NORVASC  TAKE 1 TABLET EVERY DAY   aspirin  EC 81 MG tablet Take 1 tablet (81 mg total) by mouth daily. Swallow whole.   Blood Glucose Monitoring Suppl Devi 1 each by Does not apply route in the morning, at noon, and at bedtime. May substitute to any manufacturer covered by patient's insurance.   doxazosin  2 MG tablet Commonly known as: CARDURA  TAKE 1 TABLET EVERY MORNING   furosemide  40 MG tablet Commonly known as: LASIX  TAKE 1 TABLET EVERY DAY   Mag-Oxide 200 MG  Tabs Generic drug: Magnesium  Oxide -Mg Supplement Take 1 tablet (200 mg total) by mouth daily.   metFORMIN  500 MG tablet Commonly known as: GLUCOPHAGE  TAKE 2 TABLETS TWICE DAILY   mirtazapine  7.5 MG tablet Commonly known as: REMERON  Take 1 tablet (7.5 mg total) by mouth at bedtime.   omeprazole  20 MG tablet Commonly known as: PRILOSEC  OTC Take 1 tablet (20 mg total) by mouth daily as needed (GERD symptoms).   potassium chloride  SA 20 MEQ tablet Commonly known as: KLOR-CON  M TAKE 1 TABLET EVERY DAY   pravastatin  40 MG tablet Commonly known as: PRAVACHOL  TAKE 1 TABLET AT BEDTIME        Allergies: No Known Allergies  Family History: Family History  Problem Relation Age of Onset   Arthritis Mother    Hypertension Mother    Breast cancer Mother    Arthritis Father    Hypertension Father    Colon cancer Father     Social History:  reports that he has never smoked. He has never used smokeless tobacco. He reports that he does not drink alcohol  and does not use drugs.   Physical Exam: BP (!) 176/69   Pulse (!) 108   Ht 5\' 10"  (1.778 m)   Wt 170 lb (77.1 kg)   BMI 24.39 kg/m   Constitutional:  Alert and oriented, No acute distress. HEENT: [place] AT Respiratory: Normal respiratory effort, no increased work of breathing. Psychiatric: Normal mood and affect.   Pertinent Imaging: Renal ultrasound performed 06/27/23 was personally reviewed and interpreted. I calculated the prostate volume at approximately 61 cc.   US  RENAL  Narrative CLINICAL DATA:  stage 3b chronic kidney disease  EXAM: RENAL / URINARY TRACT ULTRASOUND COMPLETE  COMPARISON:  November 06, 2010  FINDINGS: Right Kidney:  Renal measurements: 10.5 x 5.0 x 5.1 cm = volume: 141 mL. Echogenicity within normal limits. No hydronephrosis visualized. Benign cyst is noted measuring up to 17 mm (for which no dedicated imaging follow-up is recommended).  Left Kidney:  Renal measurements: 10.9 x 5.8 x 5.0 cm  = volume: 167 mL. Echogenicity within normal limits. No hydronephrosis visualized. Benign cyst is noted measuring up to 13 mm (for which no dedicated imaging follow-up is recommended).  Bladder:  Circumferential bladder wall prominence for degree of distension, likely sequela of chronic outlet obstruction  Other:  Prostatomegaly with median lobe hypertrophy.  IMPRESSION: 1. No hydronephrosis. 2. Thickened bladder wall likely reflects the sequela of chronic outlet obstruction. Consider correlation with urinalysis. 3. Prostatomegaly.   Electronically Signed By: Clancy Crimes M.D. On: 07/20/2023 11:47   Assessment & Plan:    1. BPH with lower urinary tract symptoms Mild lower urinary tract symptoms which are not bothersome.  Adequate emptying with a PVR 15 mL.  Would not recommend any additional treatment at this time. If his voiding symptoms worsen would recommend a more prostate-specific alpha-blocker than  titrating the doxazosin , as this could cause orthostatic symptoms.  1 year follow-up with PVR.  I have reviewed the above documentation for accuracy and completeness, and I agree with the above.   Ryan Knapp, MD  [facility] [address] [place], [place] [address] [phone]

## 2024-02-05 LAB — HM DIABETES EYE EXAM

## 2024-02-14 ENCOUNTER — Other Ambulatory Visit: Payer: Self-pay | Admitting: Nurse Practitioner

## 2024-02-14 DIAGNOSIS — E1165 Type 2 diabetes mellitus with hyperglycemia: Secondary | ICD-10-CM

## 2024-02-17 NOTE — Telephone Encounter (Signed)
 Requested medications are due for refill today.  yes  Requested medications are on the active medications list.  yes  Last refill. 12/03/2023 #[phone] 0 rf  Future visit scheduled.   yes  Notes to clinic.  Expired CBC.    Requested Prescriptions  Pending Prescriptions Disp Refills   metFORMIN  (GLUCOPHAGE ) 500 MG tablet [Pharmacy Med Name: metFORMIN  HCl Oral Tablet 500 MG] 360 tablet 3    Sig: TAKE 2 TABLETS TWICE DAILY     Endocrinology:  Diabetes - Biguanides Failed - 02/17/2024 11:50 AM      Failed - Cr in normal range and within 360 days    Creatinine, Ser  Date Value Ref Range Status  12/19/2023 1.78 (H) 0.76 - 1.27 mg/dL Final         Failed - eGFR in normal range and within 360 days    GFR calc Af Amer  Date Value Ref Range Status  08/18/2020 62 >59 mL/min/1.73 Final    Comment:    **In accordance with recommendations from the NKF-ASN Task force,**   [facility] is in the process of updating its eGFR calculation to the   2021 CKD-EPI creatinine equation that estimates kidney function   without a race variable.    GFR, Estimated  Date Value Ref Range Status  09/28/2022 >60 >60 mL/min Final    Comment:    (NOTE) Calculated using the CKD-EPI Creatinine Equation (2021)    eGFR  Date Value Ref Range Status  12/19/2023 38 (L) >59 mL/min/1.73 Final         Failed - B12 Level in normal range and within 720 days    Vitamin B-12  Date Value Ref Range Status  09/27/2021 263 232 - 1,245 pg/mL Final         Failed - CBC within normal limits and completed in the last 12 months    WBC  Date Value Ref Range Status  10/10/2022 5.2 3.4 - 10.8 x10E3/uL Final  09/26/2022 6.0 4.0 - 10.5 K/uL Final   RBC  Date Value Ref Range Status  10/10/2022 4.12 (L) 4.14 - 5.80 x10E6/uL Final  09/26/2022 4.15 (L) 4.22 - 5.81 MIL/uL Final   Hemoglobin  Date Value Ref Range Status  10/10/2022 12.3 (L) 13.0 - 17.7 g/dL Final   Hematocrit  Date Value Ref Range Status  10/10/2022 37.6 37.5  - 51.0 % Final   MCHC  Date Value Ref Range Status  10/10/2022 32.7 31.5 - 35.7 g/dL Final  16/06/9603 54.0 30.0 - 36.0 g/dL Final   [facility]  Date Value Ref Range Status  10/10/2022 29.9 26.6 - 33.0 pg Final  09/26/2022 30.6 26.0 - 34.0 pg Final   MCV  Date Value Ref Range Status  10/10/2022 91 79 - 97 fL Final   No results found for: PLTCOUNTKUC, LABPLAT, POCPLA RDW  Date Value Ref Range Status  10/10/2022 13.1 11.6 - 15.4 % Final         Passed - HBA1C is between 0 and 7.9 and within 180 days    HB A1C (BAYER DCA - WAIVED)  Date Value Ref Range Status  09/27/2021 7.4 (H) 4.8 - 5.6 % Final    Comment:             Prediabetes: 5.7 - 6.4          Diabetes: >6.4          Glycemic control for adults with diabetes: <7.0    Hgb A1c MFr Bld  Date Value  Ref Range Status  12/19/2023 6.2 (H) 4.8 - 5.6 % Final    Comment:             Prediabetes: 5.7 - 6.4          Diabetes: >6.4          Glycemic control for adults with diabetes: <7.0          Passed - Valid encounter within last 6 months    Recent Outpatient Visits           2 months ago Hyperlipidemia associated with type 2 diabetes mellitus (HCC)   [place] [facility] Aileen Alexanders, NP       Future Appointments             In 11 months Stoioff, Kizzie Perks, MD [facility] [place]            Signed Prescriptions Disp Refills   doxazosin  (CARDURA ) 2 MG tablet 90 tablet 1    Sig: TAKE 1 TABLET EVERY MORNING     Cardiovascular:  Alpha Blockers Failed - 02/17/2024 11:50 AM      Failed - Last BP in normal range    BP Readings from Last 1 Encounters:  01/22/24 (!) 176/69         Passed - Valid encounter within last 6 months    Recent Outpatient Visits           2 months ago Hyperlipidemia associated with type 2 diabetes mellitus (HCC)   [facility] Aileen Alexanders, NP       Future Appointments             In 11 months Stoioff, Kizzie Perks, MD  [facility] [place]             furosemide  (LASIX ) 40 MG tablet 90 tablet 1    Sig: TAKE 1 TABLET EVERY DAY     Cardiovascular:  Diuretics - Loop Failed - 02/17/2024 11:50 AM      Failed - Ca in normal range and within 180 days    Calcium   Date Value Ref Range Status  12/19/2023 10.8 (H) 8.6 - 10.2 mg/dL Final   Calcium , Ion  Date Value Ref Range Status  06/01/2020 1.31 1.15 - 1.40 mmol/L Final         Failed - Cr in normal range and within 180 days    Creatinine, Ser  Date Value Ref Range Status  12/19/2023 1.78 (H) 0.76 - 1.27 mg/dL Final         Failed - Mg Level in normal range and within 180 days    Magnesium   Date Value Ref Range Status  10/10/2022 1.4 (L) 1.6 - 2.3 mg/dL Final         Failed - Last BP in normal range    BP Readings from Last 1 Encounters:  01/22/24 (!) 176/69         Passed - K in normal range and within 180 days    Potassium  Date Value Ref Range Status  12/19/2023 3.7 3.5 - 5.2 mmol/L Final         Passed - Na in normal range and within 180 days    Sodium  Date Value Ref Range Status  12/19/2023 141 134 - 144 mmol/L Final         Passed - Cl in normal range and within 180 days    Chloride  Date Value Ref Range Status  12/19/2023 100 96 - 106 mmol/L Final         Passed - Valid encounter within last 6 months    Recent Outpatient Visits           2 months ago Hyperlipidemia associated with type 2 diabetes mellitus (HCC)   [place] [facility] Aileen Alexanders, NP       Future Appointments             In 11 months Stoioff, Kizzie Perks, MD [facility]

## 2024-02-17 NOTE — Telephone Encounter (Signed)
 Requested Prescriptions  Pending Prescriptions Disp Refills   metFORMIN  (GLUCOPHAGE ) 500 MG tablet [Pharmacy Med Name: metFORMIN  HCl Oral Tablet 500 MG] 360 tablet 3    Sig: TAKE 2 TABLETS TWICE DAILY     Endocrinology:  Diabetes - Biguanides Failed - 02/17/2024 11:50 AM      Failed - Cr in normal range and within 360 days    Creatinine, Ser  Date Value Ref Range Status  12/19/2023 1.78 (H) 0.76 - 1.27 mg/dL Final         Failed - eGFR in normal range and within 360 days    GFR calc Af Amer  Date Value Ref Range Status  08/18/2020 62 >59 mL/min/1.73 Final    Comment:    **In accordance with recommendations from the NKF-ASN Task force,**   [facility] is in the process of updating its eGFR calculation to the   2021 CKD-EPI creatinine equation that estimates kidney function   without a race variable.    GFR, Estimated  Date Value Ref Range Status  09/28/2022 >60 >60 mL/min Final    Comment:    (NOTE) Calculated using the CKD-EPI Creatinine Equation (2021)    eGFR  Date Value Ref Range Status  12/19/2023 38 (L) >59 mL/min/1.73 Final         Failed - B12 Level in normal range and within 720 days    Vitamin B-12  Date Value Ref Range Status  09/27/2021 263 232 - 1,245 pg/mL Final         Failed - CBC within normal limits and completed in the last 12 months    WBC  Date Value Ref Range Status  10/10/2022 5.2 3.4 - 10.8 x10E3/uL Final  09/26/2022 6.0 4.0 - 10.5 K/uL Final   RBC  Date Value Ref Range Status  10/10/2022 4.12 (L) 4.14 - 5.80 x10E6/uL Final  09/26/2022 4.15 (L) 4.22 - 5.81 MIL/uL Final   Hemoglobin  Date Value Ref Range Status  10/10/2022 12.3 (L) 13.0 - 17.7 g/dL Final   Hematocrit  Date Value Ref Range Status  10/10/2022 37.6 37.5 - 51.0 % Final   MCHC  Date Value Ref Range Status  10/10/2022 32.7 31.5 - 35.7 g/dL Final  16/06/9603 54.0 30.0 - 36.0 g/dL Final   [facility]  Date Value Ref Range Status  10/10/2022 29.9 26.6 - 33.0 pg Final  09/26/2022  30.6 26.0 - 34.0 pg Final   MCV  Date Value Ref Range Status  10/10/2022 91 79 - 97 fL Final   No results found for: PLTCOUNTKUC, LABPLAT, POCPLA RDW  Date Value Ref Range Status  10/10/2022 13.1 11.6 - 15.4 % Final         Passed - HBA1C is between 0 and 7.9 and within 180 days    HB A1C (BAYER DCA - WAIVED)  Date Value Ref Range Status  09/27/2021 7.4 (H) 4.8 - 5.6 % Final    Comment:             Prediabetes: 5.7 - 6.4          Diabetes: >6.4          Glycemic control for adults with diabetes: <7.0    Hgb A1c MFr Bld  Date Value Ref Range Status  12/19/2023 6.2 (H) 4.8 - 5.6 % Final    Comment:             Prediabetes: 5.7 - 6.4          Diabetes: >6.4  Glycemic control for adults with diabetes: <7.0          Passed - Valid encounter within last 6 months    Recent Outpatient Visits           2 months ago Hyperlipidemia associated with type 2 diabetes mellitus (HCC)   [place] [facility] Aileen Alexanders, NP       Future Appointments             In 11 months Stoioff, Kizzie Perks, MD [facility] [place]             doxazosin  (CARDURA ) 2 MG tablet [Pharmacy Med Name: Doxazosin  Mesylate Oral Tablet 2 MG] 90 tablet 1    Sig: TAKE 1 TABLET EVERY MORNING     Cardiovascular:  Alpha Blockers Failed - 02/17/2024 11:50 AM      Failed - Last BP in normal range    BP Readings from Last 1 Encounters:  01/22/24 (!) 176/69         Passed - Valid encounter within last 6 months    Recent Outpatient Visits           2 months ago Hyperlipidemia associated with type 2 diabetes mellitus (HCC)   [place] [facility] Aileen Alexanders, NP       Future Appointments             In 11 months Stoioff, Kizzie Perks, MD [facility] [place]             furosemide  (LASIX ) 40 MG tablet [Pharmacy Med Name: Furosemide  Oral Tablet 40 MG] 90 tablet 1    Sig: TAKE 1 TABLET EVERY DAY     Cardiovascular:   Diuretics - Loop Failed - 02/17/2024 11:50 AM      Failed - Ca in normal range and within 180 days    Calcium   Date Value Ref Range Status  12/19/2023 10.8 (H) 8.6 - 10.2 mg/dL Final   Calcium , Ion  Date Value Ref Range Status  06/01/2020 1.31 1.15 - 1.40 mmol/L Final         Failed - Cr in normal range and within 180 days    Creatinine, Ser  Date Value Ref Range Status  12/19/2023 1.78 (H) 0.76 - 1.27 mg/dL Final         Failed - Mg Level in normal range and within 180 days    Magnesium   Date Value Ref Range Status  10/10/2022 1.4 (L) 1.6 - 2.3 mg/dL Final         Failed - Last BP in normal range    BP Readings from Last 1 Encounters:  01/22/24 (!) 176/69         Passed - K in normal range and within 180 days    Potassium  Date Value Ref Range Status  12/19/2023 3.7 3.5 - 5.2 mmol/L Final         Passed - Na in normal range and within 180 days    Sodium  Date Value Ref Range Status  12/19/2023 141 134 - 144 mmol/L Final         Passed - Cl in normal range and within 180 days    Chloride  Date Value Ref Range Status  12/19/2023 100 96 - 106 mmol/L Final         Passed - Valid encounter within last 6 months    Recent Outpatient Visits  2 months ago Hyperlipidemia associated with type 2 diabetes mellitus [facility])   [place] [facility] Aileen Alexanders, NP       Future Appointments             In 11 months Stoioff, Kizzie Perks, MD [facility]

## 2024-02-21 ENCOUNTER — Other Ambulatory Visit: Payer: Self-pay | Admitting: Nurse Practitioner

## 2024-02-21 DIAGNOSIS — E1165 Type 2 diabetes mellitus with hyperglycemia: Secondary | ICD-10-CM

## 2024-02-24 NOTE — Telephone Encounter (Signed)
 Requested Prescriptions  Pending Prescriptions Disp Refills   pravastatin  (PRAVACHOL ) 40 MG tablet [Pharmacy Med Name: Pravastatin  Sodium Oral Tablet 40 MG] 90 tablet 1    Sig: TAKE 1 TABLET AT BEDTIME     Cardiovascular:  Antilipid - Statins Failed - 02/24/2024  3:44 PM      Failed - Lipid Panel in normal range within the last 12 months    Cholesterol, Total  Date Value Ref Range Status  12/19/2023 152 100 - 199 mg/dL Final   Cholesterol Piccolo, Waived  Date Value Ref Range Status  07/02/2018 143 <200 mg/dL Final    Comment:                            Desirable                <200                         Borderline High      200- 239                         High                     >239    LDL Chol Calc (NIH)  Date Value Ref Range Status  12/19/2023 72 0 - 99 mg/dL Final   HDL  Date Value Ref Range Status  12/19/2023 63 >39 mg/dL Final   Triglycerides  Date Value Ref Range Status  12/19/2023 91 0 - 149 mg/dL Final   Triglycerides Piccolo,Waived  Date Value Ref Range Status  07/02/2018 124 <150 mg/dL Final    Comment:                            Normal                   <150                         Borderline High     150 - 199                         High                200 - 499                         Very High                >499          Passed - Patient is not pregnant      Passed - Valid encounter within last 12 months    Recent Outpatient Visits           2 months ago Hyperlipidemia associated with type 2 diabetes mellitus (HCC)   [place] [facility] Melvin Pao, NP       Future Appointments             In 11 months Stoioff, Glendia BROCKS, MD [facility] [place]

## 2024-03-20 ENCOUNTER — Ambulatory Visit (INDEPENDENT_AMBULATORY_CARE_PROVIDER_SITE_OTHER): Admitting: Nurse Practitioner

## 2024-03-20 ENCOUNTER — Encounter: Payer: Self-pay | Admitting: Nurse Practitioner

## 2024-03-20 VITALS — BP 138/82 | HR 99 | Temp 98.9°F | Resp 15 | Ht 70.0 in | Wt 169.4 lb

## 2024-03-20 DIAGNOSIS — E1159 Type 2 diabetes mellitus with other circulatory complications: Secondary | ICD-10-CM

## 2024-03-20 DIAGNOSIS — I152 Hypertension secondary to endocrine disorders: Secondary | ICD-10-CM

## 2024-03-20 DIAGNOSIS — E1169 Type 2 diabetes mellitus with other specified complication: Secondary | ICD-10-CM

## 2024-03-20 DIAGNOSIS — Z794 Long term (current) use of insulin: Secondary | ICD-10-CM

## 2024-03-20 DIAGNOSIS — N1832 Chronic kidney disease, stage 3b: Secondary | ICD-10-CM

## 2024-03-20 DIAGNOSIS — Z7984 Long term (current) use of oral hypoglycemic drugs: Secondary | ICD-10-CM

## 2024-03-20 DIAGNOSIS — E119 Type 2 diabetes mellitus without complications: Secondary | ICD-10-CM

## 2024-03-20 DIAGNOSIS — E785 Hyperlipidemia, unspecified: Secondary | ICD-10-CM

## 2024-03-20 NOTE — Assessment & Plan Note (Signed)
 Chronic.  Controlled.  Family checks sugars but patient forgot book for appointment.  Last A1c in April was 6.2%.  If A1c elevated at next visit will add SGLT2.  Follow up in 3 month.  Call sooner if concerns arise. Microalbumin up to date.

## 2024-03-20 NOTE — Assessment & Plan Note (Signed)
 Chronic.  Controlled.  Continue with current medication regimen of Amlodipine , benzepril and Furosemide .  Recommend checking blood pressures at home and bringing log to next visit. Return to clinic in 3 months for reevaluation.  Labs ordered today.  Call sooner if concerns arise.

## 2024-03-20 NOTE — Assessment & Plan Note (Signed)
Chronic.  Controlled.  Continue with current medication regimen on Pravastatin 40mg  daily.  Labs ordered at visit today.  Follow up in 3 months. Call sooner if concerns arise.

## 2024-03-20 NOTE — Assessment & Plan Note (Signed)
 Chronic.  Controlled.  Continue with current medication regimen.  Keep blood pressure well controlled.  Microalbumin up to date. Followed by Dr. Marcelino. Labs ordered today.  Return to clinic in 6 months for reevaluation.  Call sooner if concerns arise.

## 2024-03-20 NOTE — Progress Notes (Signed)
 BP 138/82   Pulse 99   Temp 98.9 F (37.2 C) (Oral)   Resp 15   Ht 5' 10 (1.778 m)   Wt 169 lb 6.4 oz (76.8 kg)   SpO2 97%   BMI 24.31 kg/m    Subjective:    Patient ID: Ryan Kirby, male    DOB: 1940-10-25, 83 y.o.   MRN: [id number]  HPI: Ryan Kirby is a 83 y.o. male  Chief Complaint  Patient presents with   Diabetes    No concerns.    Hypertension   Hip Pain    Both are bothersome and cause him pain.    HYPERTENSION / HYPERLIPIDEMIA Satisfied with current treatment? no Duration of hypertension: years BP monitoring frequency: not checking BP range:  BP medication side effects: no Past BP meds: amlodipine  and benazepril /HCTZ Duration of hyperlipidemia: years Cholesterol medication side effects: no Cholesterol supplements: none Past cholesterol medications: pravastatin  (pravachol ) Medication compliance: excellent compliance Aspirin : no Recent stressors: no Recurrent headaches: no Visual changes: no Palpitations: no Dyspnea: no Chest pain: no Lower extremity edema: no Dizzy/lightheaded: no  DIABETES Hypoglycemic episodes:no Polydipsia/polyuria: no Visual disturbance: no Chest pain: no Paresthesias: no Glucose Monitoring: no  Accucheck frequency: checking but didn't bring his log- his daughter checks 1x weekly in the evenings unsure of what they are running.  Fasting glucose:  Post prandial:   Evening:  Before meals: Taking Insulin ?: no  Long acting insulin :  Short acting insulin : Blood Pressure Monitoring: not checking Retinal Examination: Up to Date- has his eye exam scheduled for the end of the month Foot Exam: Up to Date Diabetic Education: Not Completed Pneumovax: Up to Date Influenza: Up to Date Aspirin : yes     Relevant past medical, surgical, family and social history reviewed and updated as indicated. Interim medical history since our last visit reviewed. Allergies and medications reviewed and updated.  Review of Systems   Eyes:  Negative for visual disturbance.  Respiratory:  Negative for chest tightness and shortness of breath.   Cardiovascular:  Negative for chest pain, palpitations and leg swelling.  Endocrine: Negative for polydipsia and polyuria.  Musculoskeletal:        Knee pain after fall  Neurological:  Negative for dizziness, light-headedness, numbness and headaches.    Per HPI unless specifically indicated above     Objective:    BP 138/82   Pulse 99   Temp 98.9 F (37.2 C) (Oral)   Resp 15   Ht 5' 10 (1.778 m)   Wt 169 lb 6.4 oz (76.8 kg)   SpO2 97%   BMI 24.31 kg/m   Wt Readings from Last 3 Encounters:  03/20/24 169 lb 6.4 oz (76.8 kg)  01/22/24 170 lb (77.1 kg)  12/19/23 174 lb 9.6 oz (79.2 kg)    Physical Exam Vitals and nursing note reviewed.  Constitutional:      General: He is not in acute distress.    Appearance: Normal appearance. He is not ill-appearing, toxic-appearing or diaphoretic.  HENT:     Head: Normocephalic.     Right Ear: External ear normal.     Left Ear: External ear normal.     Nose: Nose normal. No congestion or rhinorrhea.     Mouth/Throat:     Mouth: Mucous membranes are moist.  Eyes:     General:        Right eye: No discharge.        Left eye: No discharge.  Extraocular Movements: Extraocular movements intact.     Conjunctiva/sclera: Conjunctivae normal.     Pupils: Pupils are equal, round, and reactive to light.  Cardiovascular:     Rate and Rhythm: Normal rate and regular rhythm.     Heart sounds: No murmur heard. Pulmonary:     Effort: Pulmonary effort is normal. No respiratory distress.     Breath sounds: Normal breath sounds. No wheezing, rhonchi or rales.  Abdominal:     General: Abdomen is flat. Bowel sounds are normal.  Musculoskeletal:     Cervical back: Normal range of motion and neck supple.     Comments: Wearing a knee brace from Ortho  Skin:    General: Skin is warm and dry.     Capillary Refill: Capillary refill  takes less than 2 seconds.  Neurological:     General: No focal deficit present.     Mental Status: He is alert and oriented to person, place, and time.  Psychiatric:        Mood and Affect: Mood normal.        Behavior: Behavior normal.        Thought Content: Thought content normal.        Judgment: Judgment normal.     Results for orders placed or performed in visit on 02/07/24  HM DIABETES EYE EXAM   Collection Time: 02/05/24  2:55 PM  Result Value Ref Range   HM Diabetic Eye Exam Retinopathy (A) No Retinopathy      Assessment & Plan:   Problem List Items Addressed This Visit       Cardiovascular and Mediastinum   Hypertension associated with diabetes (HCC)   Chronic.  Controlled.  Continue with current medication regimen of Amlodipine , benzepril and Furosemide .  Recommend checking blood pressures at home and bringing log to next visit. Return to clinic in 3 months for reevaluation.  Labs ordered today.  Call sooner if concerns arise.       Relevant Orders   Comprehensive metabolic panel with GFR     Endocrine   Hyperlipidemia associated with type 2 diabetes mellitus (HCC)   Chronic.  Controlled.  Continue with current medication regimen on Pravastatin  40mg  daily.  Labs ordered at visit today.  Follow up in 3 months. Call sooner if concerns arise.       Diabetes mellitus treated with insulin  and oral medication (HCC)   Chronic.  Controlled.  Family checks sugars but patient forgot book for appointment.  Last A1c in April was 6.2%.  If A1c elevated at next visit will add SGLT2.  Follow up in 3 month.  Call sooner if concerns arise. Microalbumin up to date.       Relevant Orders   Hemoglobin A1c     Genitourinary   Stage 3b chronic kidney disease (HCC) - Primary   Chronic.  Controlled.  Continue with current medication regimen.  Keep blood pressure well controlled.  Microalbumin up to date. Followed by Dr. Marcelino. Labs ordered today.  Return to clinic in 6 months for  reevaluation.  Call sooner if concerns arise.         Follow up plan: Return in about 3 months (around 06/20/2024) for HTN, HLD, DM2 FU.

## 2024-03-21 LAB — COMPREHENSIVE METABOLIC PANEL WITH GFR
ALT: 7 IU/L (ref 0–44)
AST: 16 IU/L (ref 0–40)
Albumin: 4.4 g/dL (ref 3.7–4.7)
Alkaline Phosphatase: 82 IU/L (ref 44–121)
BUN/Creatinine Ratio: 8 — ABNORMAL LOW (ref 10–24)
BUN: 17 mg/dL (ref 8–27)
Bilirubin Total: 0.4 mg/dL (ref 0.0–1.2)
CO2: 20 mmol/L (ref 20–29)
Calcium: 10.2 mg/dL (ref 8.6–10.2)
Chloride: 105 mmol/L (ref 96–106)
Creatinine, Ser: 2.16 mg/dL — ABNORMAL HIGH (ref 0.76–1.27)
Globulin, Total: 2.2 g/dL (ref 1.5–4.5)
Glucose: 178 mg/dL — ABNORMAL HIGH (ref 70–99)
Potassium: 3.4 mmol/L — ABNORMAL LOW (ref 3.5–5.2)
Sodium: 144 mmol/L (ref 134–144)
Total Protein: 6.6 g/dL (ref 6.0–8.5)
eGFR: 30 mL/min/1.73 — ABNORMAL LOW (ref >59)

## 2024-03-21 LAB — HEMOGLOBIN A1C
Est. average glucose Bld gHb Est-mCnc: 131 mg/dL
Hgb A1c MFr Bld: 6.2 % — ABNORMAL HIGH (ref 4.8–5.6)

## 2024-03-23 ENCOUNTER — Ambulatory Visit: Payer: Self-pay | Admitting: Nurse Practitioner

## 2024-03-23 DIAGNOSIS — E1159 Type 2 diabetes mellitus with other circulatory complications: Secondary | ICD-10-CM

## 2024-03-27 ENCOUNTER — Other Ambulatory Visit

## 2024-03-27 DIAGNOSIS — I152 Hypertension secondary to endocrine disorders: Secondary | ICD-10-CM

## 2024-03-28 LAB — COMPREHENSIVE METABOLIC PANEL WITH GFR
ALT: 7 IU/L (ref 0–44)
AST: 16 IU/L (ref 0–40)
Albumin: 4.3 g/dL (ref 3.7–4.7)
Alkaline Phosphatase: 80 IU/L (ref 44–121)
BUN/Creatinine Ratio: 8 — ABNORMAL LOW (ref 10–24)
BUN: 17 mg/dL (ref 8–27)
Bilirubin Total: 0.5 mg/dL (ref 0.0–1.2)
CO2: 22 mmol/L (ref 20–29)
Calcium: 10 mg/dL (ref 8.6–10.2)
Chloride: 101 mmol/L (ref 96–106)
Creatinine, Ser: 2.04 mg/dL — ABNORMAL HIGH (ref 0.76–1.27)
Globulin, Total: 2.3 g/dL (ref 1.5–4.5)
Glucose: 199 mg/dL — ABNORMAL HIGH (ref 70–99)
Potassium: 3.4 mmol/L — ABNORMAL LOW (ref 3.5–5.2)
Sodium: 141 mmol/L (ref 134–144)
Total Protein: 6.6 g/dL (ref 6.0–8.5)
eGFR: 32 mL/min/1.73 — ABNORMAL LOW (ref >59)

## 2024-03-30 ENCOUNTER — Ambulatory Visit: Payer: Self-pay | Admitting: Nurse Practitioner

## 2024-03-30 DIAGNOSIS — N1832 Chronic kidney disease, stage 3b: Secondary | ICD-10-CM

## 2024-04-01 ENCOUNTER — Other Ambulatory Visit

## 2024-04-01 DIAGNOSIS — N1832 Chronic kidney disease, stage 3b: Secondary | ICD-10-CM

## 2024-04-02 ENCOUNTER — Ambulatory Visit: Payer: Self-pay | Admitting: Nurse Practitioner

## 2024-04-02 DIAGNOSIS — E1159 Type 2 diabetes mellitus with other circulatory complications: Secondary | ICD-10-CM

## 2024-04-02 LAB — COMPREHENSIVE METABOLIC PANEL WITH GFR
ALT: 9 IU/L (ref 0–44)
AST: 13 IU/L (ref 0–40)
Albumin: 4.3 g/dL (ref 3.7–4.7)
Alkaline Phosphatase: 84 IU/L (ref 44–121)
BUN/Creatinine Ratio: 10 (ref 10–24)
BUN: 21 mg/dL (ref 8–27)
Bilirubin Total: 0.4 mg/dL (ref 0.0–1.2)
CO2: 21 mmol/L (ref 20–29)
Calcium: 10 mg/dL (ref 8.6–10.2)
Chloride: 103 mmol/L (ref 96–106)
Creatinine, Ser: 2.11 mg/dL — ABNORMAL HIGH (ref 0.76–1.27)
Globulin, Total: 2.2 g/dL (ref 1.5–4.5)
Glucose: 229 mg/dL — ABNORMAL HIGH (ref 70–99)
Potassium: 3.4 mmol/L — ABNORMAL LOW (ref 3.5–5.2)
Sodium: 140 mmol/L (ref 134–144)
Total Protein: 6.5 g/dL (ref 6.0–8.5)
eGFR: 31 mL/min/1.73 — ABNORMAL LOW (ref >59)

## 2024-04-02 MED ORDER — LOSARTAN POTASSIUM 25 MG PO TABS: 25.0000 mg | ORAL_TABLET | Freq: Every day | ORAL | 0 refills | Status: DC

## 2024-04-06 DIAGNOSIS — N2581 Secondary hyperparathyroidism of renal origin: Secondary | ICD-10-CM | POA: Diagnosis not present

## 2024-04-06 DIAGNOSIS — I10 Essential (primary) hypertension: Secondary | ICD-10-CM | POA: Diagnosis not present

## 2024-04-06 DIAGNOSIS — D631 Anemia in chronic kidney disease: Secondary | ICD-10-CM | POA: Diagnosis not present

## 2024-04-06 DIAGNOSIS — N400 Enlarged prostate without lower urinary tract symptoms: Secondary | ICD-10-CM | POA: Diagnosis not present

## 2024-04-06 DIAGNOSIS — E1122 Type 2 diabetes mellitus with diabetic chronic kidney disease: Secondary | ICD-10-CM | POA: Diagnosis not present

## 2024-04-09 ENCOUNTER — Other Ambulatory Visit

## 2024-04-09 DIAGNOSIS — I152 Hypertension secondary to endocrine disorders: Secondary | ICD-10-CM | POA: Diagnosis not present

## 2024-04-09 DIAGNOSIS — E1159 Type 2 diabetes mellitus with other circulatory complications: Secondary | ICD-10-CM | POA: Diagnosis not present

## 2024-04-10 ENCOUNTER — Ambulatory Visit: Payer: Self-pay | Admitting: Nurse Practitioner

## 2024-04-10 LAB — COMPREHENSIVE METABOLIC PANEL WITH GFR
ALT: 6 IU/L (ref 0–44)
AST: 17 IU/L (ref 0–40)
Albumin: 4.4 g/dL (ref 3.7–4.7)
Alkaline Phosphatase: 82 IU/L (ref 44–121)
BUN/Creatinine Ratio: 11 (ref 10–24)
BUN: 21 mg/dL (ref 8–27)
Bilirubin Total: 0.4 mg/dL (ref 0.0–1.2)
CO2: 22 mmol/L (ref 20–29)
Calcium: 10.1 mg/dL (ref 8.6–10.2)
Chloride: 102 mmol/L (ref 96–106)
Creatinine, Ser: 1.83 mg/dL — ABNORMAL HIGH (ref 0.76–1.27)
Globulin, Total: 2.2 g/dL (ref 1.5–4.5)
Glucose: 144 mg/dL — ABNORMAL HIGH (ref 70–99)
Potassium: 3.9 mmol/L (ref 3.5–5.2)
Sodium: 142 mmol/L (ref 134–144)
Total Protein: 6.6 g/dL (ref 6.0–8.5)
eGFR: 36 mL/min/1.73 — ABNORMAL LOW (ref >59)

## 2024-04-28 ENCOUNTER — Ambulatory Visit: Payer: Self-pay

## 2024-04-29 ENCOUNTER — Other Ambulatory Visit: Payer: Self-pay | Admitting: Nurse Practitioner

## 2024-04-30 NOTE — Telephone Encounter (Signed)
 Requested Prescriptions  Pending Prescriptions Disp Refills   mirtazapine  (REMERON ) 7.5 MG tablet [Pharmacy Med Name: MIRTAZAPINE  7.5 MG TAB] 90 tablet 1    Sig: TAKE 1 TABLET BY MOUTH AT BEDTIME     Psychiatry: Antidepressants - mirtazapine  Passed - 04/30/2024  4:30 PM      Passed - Valid encounter within last 6 months    Recent Outpatient Visits           1 month ago Stage 3b chronic kidney disease (HCC)   [place] [facility] Melvin Pao, NP   4 months ago Hyperlipidemia associated with type 2 diabetes mellitus [facility])   [place] [facility] Melvin Pao, NP       Future Appointments             In 8 months Stoioff, Glendia BROCKS, MD [facility]

## 2024-05-07 ENCOUNTER — Other Ambulatory Visit: Payer: Self-pay | Admitting: Nurse Practitioner

## 2024-05-07 ENCOUNTER — Encounter: Payer: Self-pay | Admitting: Nurse Practitioner

## 2024-05-07 ENCOUNTER — Ambulatory Visit (INDEPENDENT_AMBULATORY_CARE_PROVIDER_SITE_OTHER): Admitting: Nurse Practitioner

## 2024-05-07 VITALS — BP 157/81 | HR 97 | Temp 98.4°F | Ht 70.0 in | Wt 171.2 lb

## 2024-05-07 DIAGNOSIS — Z7984 Long term (current) use of oral hypoglycemic drugs: Secondary | ICD-10-CM | POA: Diagnosis not present

## 2024-05-07 DIAGNOSIS — I152 Hypertension secondary to endocrine disorders: Secondary | ICD-10-CM | POA: Diagnosis not present

## 2024-05-07 DIAGNOSIS — E1159 Type 2 diabetes mellitus with other circulatory complications: Secondary | ICD-10-CM | POA: Diagnosis not present

## 2024-05-07 DIAGNOSIS — E1165 Type 2 diabetes mellitus with hyperglycemia: Secondary | ICD-10-CM

## 2024-05-07 MED ORDER — LOSARTAN POTASSIUM 50 MG PO TABS: 50.0000 mg | ORAL_TABLET | Freq: Every day | ORAL | 0 refills | Status: AC

## 2024-05-07 NOTE — Telephone Encounter (Signed)
 Requested Prescriptions  Pending Prescriptions Disp Refills   amLODipine  (NORVASC ) 10 MG tablet [Pharmacy Med Name: AMLODIPINE  BESYLATE 10 MG Oral Tablet] 90 tablet 1    Sig: TAKE 1 TABLET EVERY DAY     Cardiovascular: Calcium  Channel Blockers 2 Failed - 05/07/2024  2:11 PM      Failed - Last BP in normal range    BP Readings from Last 1 Encounters:  05/07/24 (!) 157/81         Passed - Last Heart Rate in normal range    Pulse Readings from Last 1 Encounters:  05/07/24 97         Passed - Valid encounter within last 6 months    Recent Outpatient Visits           Today Hypertension associated with diabetes [facility])   [place] [facility] Melvin Pao, NP   1 month ago Stage 3b chronic kidney disease [facility])   [facility] Melvin Pao, NP   4 months ago Hyperlipidemia associated with type 2 diabetes mellitus [facility])   [place] [facility] Melvin Pao, NP       Future Appointments             In 8 months Stoioff, Glendia BROCKS, MD [facility] [place]

## 2024-05-07 NOTE — Progress Notes (Signed)
 BP (!) 157/81   Pulse 97   Temp 98.4 F (36.9 C) (Oral)   Ht 5' 10 (1.778 m)   Wt 171 lb 3.2 oz (77.7 kg)   SpO2 95%   BMI 24.56 kg/m    Subjective:    Patient ID: Ryan Kirby, male    DOB: 01-May-1941, 83 y.o.   MRN: [id number]  HPI: KINGSLEY FARACE is a 83 y.o. male  Chief Complaint  Patient presents with   Hypertension   HYPERTENSION / HYPERLIPIDEMIA Satisfied with current treatment? no Duration of hypertension: years BP monitoring frequency: not checking BP range:  BP medication side effects: no Past BP meds: amlodipine  and Losartan /furosemide /doxazosin  Duration of hyperlipidemia: years Cholesterol medication side effects: no Cholesterol supplements: none Past cholesterol medications: pravastatin  (pravachol ) Medication compliance: excellent compliance Aspirin : no Recent stressors: no Recurrent headaches: no Visual changes: no Palpitations: no Dyspnea: no Chest pain: no Lower extremity edema: no Dizzy/lightheaded: no    Relevant past medical, surgical, family and social history reviewed and updated as indicated. Interim medical history since our last visit reviewed. Allergies and medications reviewed and updated.  Review of Systems  Eyes:  Negative for visual disturbance.  Respiratory:  Negative for shortness of breath.   Cardiovascular:  Negative for chest pain and leg swelling.  Neurological:  Negative for light-headedness and headaches.    Per HPI unless specifically indicated above     Objective:    BP (!) 157/81   Pulse 97   Temp 98.4 F (36.9 C) (Oral)   Ht 5' 10 (1.778 m)   Wt 171 lb 3.2 oz (77.7 kg)   SpO2 95%   BMI 24.56 kg/m   Wt Readings from Last 3 Encounters:  05/07/24 171 lb 3.2 oz (77.7 kg)  03/20/24 169 lb 6.4 oz (76.8 kg)  01/22/24 170 lb (77.1 kg)    Physical Exam Vitals and nursing note reviewed.  Constitutional:      General: He is not in acute distress.    Appearance: Normal appearance. He is not ill-appearing,  toxic-appearing or diaphoretic.  HENT:     Head: Normocephalic.     Right Ear: External ear normal.     Left Ear: External ear normal.     Nose: Nose normal. No congestion or rhinorrhea.     Mouth/Throat:     Mouth: Mucous membranes are moist.  Eyes:     General:        Right eye: No discharge.        Left eye: No discharge.     Extraocular Movements: Extraocular movements intact.     Conjunctiva/sclera: Conjunctivae normal.     Pupils: Pupils are equal, round, and reactive to light.  Cardiovascular:     Rate and Rhythm: Normal rate and regular rhythm.     Heart sounds: No murmur heard. Pulmonary:     Effort: Pulmonary effort is normal. No respiratory distress.     Breath sounds: Normal breath sounds. No wheezing, rhonchi or rales.  Abdominal:     General: Abdomen is flat. Bowel sounds are normal.  Musculoskeletal:     Cervical back: Normal range of motion and neck supple.  Skin:    General: Skin is warm and dry.     Capillary Refill: Capillary refill takes less than 2 seconds.  Neurological:     General: No focal deficit present.     Mental Status: He is alert and oriented to person, place, and time.  Psychiatric:  Mood and Affect: Mood normal.        Behavior: Behavior normal.        Thought Content: Thought content normal.        Judgment: Judgment normal.     Results for orders placed or performed in visit on 04/09/24  Comp Met (CMET)   Collection Time: 04/09/24  9:59 AM  Result Value Ref Range   Glucose 144 (H) 70 - 99 mg/dL   BUN 21 8 - 27 mg/dL   Creatinine, Ser 8.16 (H) 0.76 - 1.27 mg/dL   eGFR 36 (L) >40 fO/fpw/8.26   BUN/Creatinine Ratio 11 10 - 24   Sodium 142 134 - 144 mmol/L   Potassium 3.9 3.5 - 5.2 mmol/L   Chloride 102 96 - 106 mmol/L   CO2 22 20 - 29 mmol/L   Calcium  10.1 8.6 - 10.2 mg/dL   Total Protein 6.6 6.0 - 8.5 g/dL   Albumin 4.4 3.7 - 4.7 g/dL   Globulin, Total 2.2 1.5 - 4.5 g/dL   Bilirubin Total 0.4 0.0 - 1.2 mg/dL   Alkaline  Phosphatase 82 44 - 121 IU/L   AST 17 0 - 40 IU/L   ALT 6 0 - 44 IU/L      Assessment & Plan:   Problem List Items Addressed This Visit       Cardiovascular and Mediastinum   Hypertension associated with diabetes (HCC) - Primary   Chronic. Not well controlled.  Will increase losartan  to 50mg  daily.  Continue with furosemide , doxazosin  and Amlodipine .  CMP checked.  Cr has been elevated recently. Keep appointment for next month.      Relevant Medications   losartan  (COZAAR ) 50 MG tablet   Other Relevant Orders   Comp Met (CMET)     Follow up plan: Return for Keep appt for October.

## 2024-05-07 NOTE — Assessment & Plan Note (Signed)
 Chronic. Not well controlled.  Will increase losartan  to 50mg  daily.  Continue with furosemide , doxazosin  and Amlodipine .  CMP checked.  Cr has been elevated recently. Keep appointment for next month.

## 2024-05-08 ENCOUNTER — Ambulatory Visit: Payer: Self-pay | Admitting: Nurse Practitioner

## 2024-05-08 LAB — COMPREHENSIVE METABOLIC PANEL WITH GFR
ALT: 9 IU/L (ref 0–44)
AST: 17 IU/L (ref 0–40)
Albumin: 4.4 g/dL (ref 3.7–4.7)
Alkaline Phosphatase: 79 IU/L (ref 44–121)
BUN/Creatinine Ratio: 10 (ref 10–24)
BUN: 22 mg/dL (ref 8–27)
Bilirubin Total: 0.4 mg/dL (ref 0.0–1.2)
CO2: 19 mmol/L — ABNORMAL LOW (ref 20–29)
Calcium: 9.8 mg/dL (ref 8.6–10.2)
Chloride: 103 mmol/L (ref 96–106)
Creatinine, Ser: 2.12 mg/dL — ABNORMAL HIGH (ref 0.76–1.27)
Globulin, Total: 2.2 g/dL (ref 1.5–4.5)
Glucose: 120 mg/dL — ABNORMAL HIGH (ref 70–99)
Potassium: 4.4 mmol/L (ref 3.5–5.2)
Sodium: 139 mmol/L (ref 134–144)
Total Protein: 6.6 g/dL (ref 6.0–8.5)
eGFR: 30 mL/min/1.73 — ABNORMAL LOW (ref >59)

## 2024-06-22 ENCOUNTER — Ambulatory Visit (INDEPENDENT_AMBULATORY_CARE_PROVIDER_SITE_OTHER): Admitting: Nurse Practitioner

## 2024-06-22 ENCOUNTER — Encounter: Payer: Self-pay | Admitting: Nurse Practitioner

## 2024-06-22 VITALS — BP 163/83 | HR 102 | Temp 98.1°F | Ht 70.0 in | Wt 168.8 lb

## 2024-06-22 DIAGNOSIS — E1159 Type 2 diabetes mellitus with other circulatory complications: Secondary | ICD-10-CM | POA: Diagnosis not present

## 2024-06-22 DIAGNOSIS — E119 Type 2 diabetes mellitus without complications: Secondary | ICD-10-CM | POA: Diagnosis not present

## 2024-06-22 DIAGNOSIS — I152 Hypertension secondary to endocrine disorders: Secondary | ICD-10-CM

## 2024-06-22 DIAGNOSIS — E785 Hyperlipidemia, unspecified: Secondary | ICD-10-CM

## 2024-06-22 DIAGNOSIS — Z794 Long term (current) use of insulin: Secondary | ICD-10-CM | POA: Diagnosis not present

## 2024-06-22 DIAGNOSIS — Z23 Encounter for immunization: Secondary | ICD-10-CM

## 2024-06-22 DIAGNOSIS — Z7984 Long term (current) use of oral hypoglycemic drugs: Secondary | ICD-10-CM

## 2024-06-22 DIAGNOSIS — E1169 Type 2 diabetes mellitus with other specified complication: Secondary | ICD-10-CM | POA: Diagnosis not present

## 2024-06-22 DIAGNOSIS — R000 Tachycardia, unspecified: Secondary | ICD-10-CM | POA: Diagnosis not present

## 2024-06-22 DIAGNOSIS — N1832 Chronic kidney disease, stage 3b: Secondary | ICD-10-CM

## 2024-06-22 MED ORDER — METOPROLOL SUCCINATE ER 25 MG PO TB24: 25.0000 mg | ORAL_TABLET | Freq: Every day | ORAL | 1 refills | Status: AC

## 2024-06-22 NOTE — Assessment & Plan Note (Signed)
 Chronic. Not well controlled.   Will add Metoprolol due to elevated HR.  Side effects and benefits of medication discussed.  If still elevated at next visit, can increase Losartan  to 100mg .  Continue with furosemide , doxazosin , losartan  and Amlodipine .  CMP checked.  Follow up in 1 month.

## 2024-06-22 NOTE — Assessment & Plan Note (Signed)
Chronic.  Controlled.  Continue with current medication regimen on Pravastatin 40mg  daily.  Labs ordered at visit today.  Follow up in 3 months. Call sooner if concerns arise.

## 2024-06-22 NOTE — Progress Notes (Signed)
 BP (!) 163/83 (BP Location: Left Arm, Cuff Size: Normal)   Pulse (!) 102   Temp 98.1 F (36.7 C) (Oral)   Ht 5' 10 (1.778 m)   Wt 168 lb 12.8 oz (76.6 kg)   SpO2 100%   BMI 24.22 kg/m    Subjective:    Patient ID: Ryan Kirby, male    DOB: [birth year], 83 y.o.   MRN: [id number]  HPI: Ryan Kirby is a 83 y.o. male  Chief Complaint  Patient presents with   Diabetes   Hyperlipidemia   Hypertension   HYPERTENSION / HYPERLIPIDEMIA Satisfied with current treatment? no Duration of hypertension: years BP monitoring frequency: not checking BP range:  BP medication side effects: no Past BP meds: amlodipine  and benazepril /HCTZ Duration of hyperlipidemia: years Cholesterol medication side effects: no Cholesterol supplements: none Past cholesterol medications: pravastatin  (pravachol ) Medication compliance: excellent compliance Aspirin : no Recent stressors: no Recurrent headaches: no Visual changes: no Palpitations: no Dyspnea: no Chest pain: no Lower extremity edema: no Dizzy/lightheaded: no  DIABETES Hypoglycemic episodes:no Polydipsia/polyuria: no Visual disturbance: no Chest pain: no Paresthesias: no Glucose Monitoring: no  Accucheck frequency: 180-190  Fasting glucose:  Post prandial:   Evening:  Before meals: Taking Insulin ?: no  Long acting insulin :  Short acting insulin : Blood Pressure Monitoring: not checking Retinal Examination: Up to Date Foot Exam: Up to Date Diabetic Education: Not Completed Pneumovax: Up to Date Influenza: Up to Date Aspirin : yes    Relevant past medical, surgical, family and social history reviewed and updated as indicated. Interim medical history since our last visit reviewed. Allergies and medications reviewed and updated.  Review of Systems  Eyes:  Negative for visual disturbance.  Respiratory:  Negative for chest tightness and shortness of breath.   Cardiovascular:  Negative for chest pain, palpitations and leg  swelling.  Endocrine: Negative for polydipsia and polyuria.  Musculoskeletal:        Knee pain after fall  Neurological:  Negative for dizziness, light-headedness, numbness and headaches.    Per HPI unless specifically indicated above     Objective:    BP (!) 163/83 (BP Location: Left Arm, Cuff Size: Normal)   Pulse (!) 102   Temp 98.1 F (36.7 C) (Oral)   Ht 5' 10 (1.778 m)   Wt 168 lb 12.8 oz (76.6 kg)   SpO2 100%   BMI 24.22 kg/m   Wt Readings from Last 3 Encounters:  06/22/24 168 lb 12.8 oz (76.6 kg)  05/07/24 171 lb 3.2 oz (77.7 kg)  03/20/24 169 lb 6.4 oz (76.8 kg)    Physical Exam Vitals and nursing note reviewed.  Constitutional:      General: He is not in acute distress.    Appearance: Normal appearance. He is not ill-appearing, toxic-appearing or diaphoretic.  HENT:     Head: Normocephalic.     Right Ear: External ear normal.     Left Ear: External ear normal.     Nose: Nose normal. No congestion or rhinorrhea.     Mouth/Throat:     Mouth: Mucous membranes are moist.  Eyes:     General:        Right eye: No discharge.        Left eye: No discharge.     Extraocular Movements: Extraocular movements intact.     Conjunctiva/sclera: Conjunctivae normal.     Pupils: Pupils are equal, round, and reactive to light.  Cardiovascular:     Rate and Rhythm: Tachycardia present. Rhythm  irregular.     Heart sounds: Murmur heard.  Pulmonary:     Effort: Pulmonary effort is normal. No respiratory distress.     Breath sounds: Normal breath sounds. No wheezing, rhonchi or rales.  Abdominal:     General: Abdomen is flat. Bowel sounds are normal.  Musculoskeletal:     Cervical back: Normal range of motion and neck supple.     Comments: Wearing a knee brace from Ortho  Skin:    General: Skin is warm and dry.     Capillary Refill: Capillary refill takes less than 2 seconds.  Neurological:     General: No focal deficit present.     Mental Status: He is alert and  oriented to person, place, and time.  Psychiatric:        Mood and Affect: Mood normal.        Behavior: Behavior normal.        Thought Content: Thought content normal.        Judgment: Judgment normal.     Results for orders placed or performed in visit on 05/07/24  Comp Met (CMET)   Collection Time: 05/07/24 11:10 AM  Result Value Ref Range   Glucose 120 (H) 70 - 99 mg/dL   BUN 22 8 - 27 mg/dL   Creatinine, Ser 7.87 (H) 0.76 - 1.27 mg/dL   eGFR 30 (L) >40 fO/fpw/8.26   BUN/Creatinine Ratio 10 10 - 24   Sodium 139 134 - 144 mmol/L   Potassium 4.4 3.5 - 5.2 mmol/L   Chloride 103 96 - 106 mmol/L   CO2 19 (L) 20 - 29 mmol/L   Calcium  9.8 8.6 - 10.2 mg/dL   Total Protein 6.6 6.0 - 8.5 g/dL   Albumin 4.4 3.7 - 4.7 g/dL   Globulin, Total 2.2 1.5 - 4.5 g/dL   Bilirubin Total 0.4 0.0 - 1.2 mg/dL   Alkaline Phosphatase 79 44 - 121 IU/L   AST 17 0 - 40 IU/L   ALT 9 0 - 44 IU/L      Assessment & Plan:   Problem List Items Addressed This Visit       Cardiovascular and Mediastinum   Hypertension associated with diabetes (HCC)   Chronic. Not well controlled.   Will add Metoprolol due to elevated HR.  Side effects and benefits of medication discussed.  If still elevated at next visit, can increase Losartan  to 100mg .  Continue with furosemide , doxazosin , losartan  and Amlodipine .  CMP checked.  Follow up in 1 month.        Relevant Medications   metoprolol succinate (TOPROL-XL) 25 MG 24 hr tablet     Endocrine   Hyperlipidemia associated with type 2 diabetes mellitus (HCC)   Chronic.  Controlled.  Continue with current medication regimen on Pravastatin  40mg  daily.  Labs ordered at visit today.  Follow up in 3 months. Call sooner if concerns arise.       Relevant Medications   metoprolol succinate (TOPROL-XL) 25 MG 24 hr tablet   Other Relevant Orders   Hemoglobin A1c   Lipid panel   Diabetes mellitus treated with insulin  and oral medication (HCC) - Primary   Chronic.   Controlled.  Family checks sugars he states last night was 180-190.  Last A1c in July was 6.2%.  If A1c elevated at next visit will add SGLT2.  Follow up in 3 month.  Call sooner if concerns arise. Microalbumin up to date.       Relevant Orders  Comprehensive metabolic panel with GFR   Hemoglobin A1c     Genitourinary   Stage 3b chronic kidney disease (HCC)   Chronic.  Controlled.  Continue with current medication regimen.  Keep blood pressure well controlled.  Microalbumin up to date. Followed by Dr. Marcelino.  Reviewed recent note.  Labs ordered today.  Return to clinic in 6 months for reevaluation.  Call sooner if concerns arise.       Other Visit Diagnoses       Tachycardia       EKG abnormal and patient having tachycadia. Will start Metoprolol 25mg  daily.  Referral placed for Cardiology.  Follow up in 1 month.   Relevant Orders   EKG 12-Lead   Ambulatory referral to Cardiology     Need for influenza vaccination       Relevant Orders   Flu vaccine HIGH DOSE PF(Fluzone Trivalent) (Completed)        Follow up plan: Return in about 1 month (around 07/23/2024) for Tachycardia.

## 2024-06-22 NOTE — Assessment & Plan Note (Signed)
 Chronic.  Controlled.  Family checks sugars he states last night was 180-190.  Last A1c in July was 6.2%.  If A1c elevated at next visit will add SGLT2.  Follow up in 3 month.  Call sooner if concerns arise. Microalbumin up to date.

## 2024-06-22 NOTE — Assessment & Plan Note (Signed)
 Chronic.  Controlled.  Continue with current medication regimen.  Keep blood pressure well controlled.  Microalbumin up to date. Followed by Dr. Marcelino.  Reviewed recent note.  Labs ordered today.  Return to clinic in 6 months for reevaluation.  Call sooner if concerns arise.

## 2024-06-23 ENCOUNTER — Ambulatory Visit: Payer: Self-pay | Admitting: Nurse Practitioner

## 2024-06-23 LAB — COMPREHENSIVE METABOLIC PANEL WITH GFR
ALT: 7 IU/L (ref 0–44)
AST: 19 IU/L (ref 0–40)
Albumin: 4.4 g/dL (ref 3.7–4.7)
Alkaline Phosphatase: 87 IU/L (ref 48–129)
BUN/Creatinine Ratio: 11 (ref 10–24)
BUN: 24 mg/dL (ref 8–27)
Bilirubin Total: 0.5 mg/dL (ref 0.0–1.2)
CO2: 23 mmol/L (ref 20–29)
Calcium: 10.7 mg/dL — ABNORMAL HIGH (ref 8.6–10.2)
Chloride: 99 mmol/L (ref 96–106)
Creatinine, Ser: 2.25 mg/dL — ABNORMAL HIGH (ref 0.76–1.27)
Globulin, Total: 2.1 g/dL (ref 1.5–4.5)
Glucose: 189 mg/dL — ABNORMAL HIGH (ref 70–99)
Potassium: 3.9 mmol/L (ref 3.5–5.2)
Sodium: 140 mmol/L (ref 134–144)
Total Protein: 6.5 g/dL (ref 6.0–8.5)
eGFR: 28 mL/min/1.73 — ABNORMAL LOW (ref >59)

## 2024-06-23 LAB — LIPID PANEL
Chol/HDL Ratio: 2.2 ratio (ref 0.0–5.0)
Cholesterol, Total: 130 mg/dL (ref 100–199)
HDL: 59 mg/dL (ref >39)
LDL Chol Calc (NIH): 54 mg/dL (ref 0–99)
Triglycerides: 88 mg/dL (ref 0–149)
VLDL Cholesterol Cal: 17 mg/dL (ref 5–40)

## 2024-06-23 LAB — HEMOGLOBIN A1C
Est. average glucose Bld gHb Est-mCnc: 128 mg/dL
Hgb A1c MFr Bld: 6.1 % — ABNORMAL HIGH (ref 4.8–5.6)

## 2024-06-29 NOTE — Progress Notes (Signed)
 Ryan Kirby                                          MRN: [id number]   06/29/2024   The [facility] Quality Team Specialist reviewed this patient medical record for the purposes of chart review for care gap closure. The following were reviewed: chart review for care gap closure-controlling blood pressure.    [facility] Quality Team

## 2024-07-07 ENCOUNTER — Ambulatory Visit

## 2024-07-10 NOTE — Progress Notes (Signed)
 Cardiology Office Note  Date:  07/13/2024   ID:  Ryan Kirby [birth year], MRN [id number]  PCP:  Melvin Pao, NP   Chief Complaint  Patient presents with   New Patient (Initial Visit)    Referred by Melvin Pao NP for Tachycardia Establish care for CAD/CABG 2022 The pt c/o being off balance.  pt has been doing well with no complaints of chest pain, chest pressure or SOB, medication reviewed verbally with patient.     HPI:  Ryan Kirby is a 83 y.o. male with past medical history of: Past Medical History:  Diagnosis Date   Acute metabolic encephalopathy 09/23/2022   AKI (acute kidney injury) 09/23/2022   Allergy    Arthritis    CAD (coronary artery disease)    Chronic kidney disease    pt is not on dialysis   Diabetes mellitus without complication (HCC)    GERD (gastroesophageal reflux disease)    Hypercalcemia 09/23/2022   Hyperlipidemia    Hypertension    Hypertensive urgency 09/23/2022   Hypokalemia 08/18/2020   Lactic acidosis 09/23/2022   Sinus bradycardia 09/24/2022  CR 2.25 Who presents by referral from Pao Melvin for tachycardia  Last visit with primary care June 22, 2024 elevated heart rate EKG reviewed, normal sinus rhythm rate 98 bpm Started on metoprolol succinate 25  Does not check his blood pressure at home, does not have a blood pressure cuff  Initial blood pressure today elevated but improved on recheck by myself  Hobbies: active at home Pulls apart scrap metal, Sells the metal Balance is poor, no falls  Prior cardiac testing reviewed Echo January 2024 Normal left and right ventricular size and function No significant valvular heart disease  Lab work reviewed Total chol 130, LDL LDL 54 A1C 6.1 CR 2.25  EKG personally reviewed by myself on todays visit EKG Interpretation Date/Time:  Monday July 13 2024 10:03:39 EST Ventricular Rate:  85 PR Interval:  136 QRS Duration:  90 QT Interval:  346 QTC  Calculation: 411 R Axis:   -7  Text Interpretation: Normal sinus rhythm Normal ECG When compared with ECG of 25-Sep-2022 18:30, T wave inversion no longer evident in Inferior leads Nonspecific T wave abnormality, improved in Anterolateral leads Confirmed by Perla Lye [phone]) on 07/13/2024 10:23:23 AM    PMH:   has a past medical history of Acute metabolic encephalopathy (09/23/2022), AKI (acute kidney injury) (09/23/2022), Allergy, Arthritis, CAD (coronary artery disease), Chronic kidney disease, Diabetes mellitus without complication (HCC), GERD (gastroesophageal reflux disease), Hypercalcemia (09/23/2022), Hyperlipidemia, Hypertension, Hypertensive urgency (09/23/2022), Hypokalemia (08/18/2020), Lactic acidosis (09/23/2022), and Sinus bradycardia (09/24/2022).   PSH:    Past Surgical History:  Procedure Laterality Date   BOWEL RESECTION  2004   CATARACT EXTRACTION W/PHACO Right 12/24/2022   Procedure: CATARACT EXTRACTION PHACO AND INTRAOCULAR LENS PLACEMENT (IOC) RIGHT;  Surgeon: Myrna Adine Anes, MD;  Location: [facility];  Service: Ophthalmology;  Laterality: Right;  6.06 0:37.7   CATARACT EXTRACTION W/PHACO Left 01/07/2023   Procedure: CATARACT EXTRACTION PHACO AND INTRAOCULAR LENS PLACEMENT (IOC) LEFT;  Surgeon: Myrna Adine Anes, MD;  Location: [facility];  Service: Ophthalmology;  Laterality: Left;  5.63 0:36.7   CORONARY ARTERY BYPASS GRAFT     FINGER AMPUTATION Left 2011   5th   INGUINAL HERNIA REPAIR Right 06/01/2020   Procedure: HERNIA REPAIR INGUINAL ADULT, open;  Surgeon: Marolyn Nest, MD;  Location: [facility];  Service: General;  Laterality: Right;   KNEE ARTHROSCOPY Right 09/12/2015  Procedure: ARTHROSCOPY KNEE, LATERAL MENISECTOMY, CHONDROPLASTY;  Surgeon: Lynwood SHAUNNA Hue, MD;  Location: [facility];  Service: Orthopedics;  Laterality: Right;   WISDOM TOOTH EXTRACTION      Current Outpatient Medications  Medication Sig Dispense Refill    acetaminophen  (TYLENOL ) 500 MG tablet Take 500-1,000 mg by mouth every 6 (six) hours as needed for mild pain or moderate pain.     amLODipine  (NORVASC ) 10 MG tablet TAKE 1 TABLET EVERY DAY 90 tablet 1   aspirin  EC 81 MG tablet Take 1 tablet (81 mg total) by mouth daily. Swallow whole. 30 tablet 12   Blood Glucose Monitoring Suppl DEVI 1 each by Does not apply route in the morning, at noon, and at bedtime. May substitute to any manufacturer covered by patient's insurance. 1 each 0   doxazosin  (CARDURA ) 2 MG tablet TAKE 1 TABLET EVERY MORNING 90 tablet 1   furosemide  (LASIX ) 40 MG tablet TAKE 1 TABLET EVERY DAY 90 tablet 1   losartan  (COZAAR ) 50 MG tablet Take 1 tablet (50 mg total) by mouth daily. 90 tablet 0   Magnesium  Oxide -Mg Supplement (MAG-OXIDE) 200 MG TABS Take 1 tablet (200 mg total) by mouth daily. 90 tablet 1   metFORMIN  (GLUCOPHAGE ) 500 MG tablet TAKE 2 TABLETS TWICE DAILY 360 tablet 1   metoprolol succinate (TOPROL-XL) 25 MG 24 hr tablet Take 1 tablet (25 mg total) by mouth daily. 90 tablet 1   mirtazapine  (REMERON ) 7.5 MG tablet TAKE 1 TABLET BY MOUTH AT BEDTIME 90 tablet 1   omeprazole  (PRILOSEC  OTC) 20 MG tablet Take 1 tablet (20 mg total) by mouth daily as needed (GERD symptoms). 90 tablet 1   potassium chloride  SA (KLOR-CON  M) 20 MEQ tablet TAKE 1 TABLET EVERY DAY 90 tablet 2   pravastatin  (PRAVACHOL ) 40 MG tablet TAKE 1 TABLET AT BEDTIME 90 tablet 1   No current facility-administered medications for this visit.    Allergies:   Patient has no known allergies.   Social History:  The patient  reports that he has never smoked. He has never used smokeless tobacco. He reports that he does not drink alcohol  and does not use drugs.   Family History:   family history includes Arthritis in his father and mother; Breast cancer in his mother; Colon cancer in his father; Heart disease in his brother; Hypertension in his father and mother.    Review of Systems: Review of Systems   Constitutional: Negative.   HENT: Negative.    Respiratory: Negative.    Cardiovascular: Negative.   Gastrointestinal: Negative.   Musculoskeletal: Negative.   Neurological: Negative.   Psychiatric/Behavioral: Negative.    All other systems reviewed and are negative.   PHYSICAL EXAM: VS:  BP 136/70 (BP Location: Left Arm)   Pulse 85   Ht 5' 10 (1.778 m)   Wt 170 lb (77.1 kg)   SpO2 99%   BMI 24.39 kg/m  , BMI Body mass index is 24.39 kg/m. GEN: Well nourished, well developed, in no acute distress HEENT: normal Neck: no JVD, carotid bruits, or masses Cardiac: RRR; no murmurs, rubs, or gallops,no edema  Respiratory:  clear to auscultation bilaterally, normal work of breathing GI: soft, nontender, nondistended, + BS MS: no deformity or atrophy Skin: warm and dry, no rash Neuro:  Strength and sensation are intact Psych: euthymic mood, full affect  Recent Labs: 06/22/2024: ALT 7; BUN 24; Creatinine, Ser 2.25; Potassium 3.9; Sodium 140    Lipid Panel Lab Results  Component Value  Date   CHOL 130 06/22/2024   HDL 59 06/22/2024   LDLCALC 54 06/22/2024   TRIG 88 06/22/2024      Wt Readings from Last 3 Encounters:  07/13/24 170 lb (77.1 kg)  06/22/24 168 lb 12.8 oz (76.6 kg)  05/07/24 171 lb 3.2 oz (77.7 kg)      ASSESSMENT AND PLAN:  Problem List Items Addressed This Visit       Cardiology Problems   Hyperlipidemia associated with type 2 diabetes mellitus (HCC)   Hypertension associated with diabetes (HCC)   Relevant Orders   EKG 12-Lead (Completed)   CAD (coronary artery disease) - Primary   Relevant Orders   EKG 12-Lead (Completed)   PVD (peripheral vascular disease)     Other   Diabetes mellitus treated with insulin  and oral medication (HCC)   Stage 3b chronic kidney disease (HCC)   Tachycardia Prior EKG reviewed showing normal sinus rhythm rate 98 bpm Rate today in the 80s on metoprolol succinate 25, asymptomatic No further medication titration  needed Echocardiogram 2024, normal clinical exam  Essential hypertension with associated chronic kidney disease Stressed to him the importance of monitoring blood pressure at home, he does not have a blood pressure cuff Repeat blood pressure check by myself after morning medications kicking in systolic pressures in the 130s No changes made to his medication  Hyperlipidemia Cholesterol is at goal on the current lipid regimen. No changes to the medications were made.  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 [facility] [place], [place] [phone]

## 2024-07-13 ENCOUNTER — Ambulatory Visit: Attending: Cardiovascular Disease | Admitting: Cardiovascular Disease

## 2024-07-13 ENCOUNTER — Encounter: Payer: Self-pay | Admitting: Cardiovascular Disease

## 2024-07-13 VITALS — BP 136/70 | HR 85 | Ht 70.0 in | Wt 170.0 lb

## 2024-07-13 DIAGNOSIS — E1159 Type 2 diabetes mellitus with other circulatory complications: Secondary | ICD-10-CM

## 2024-07-13 DIAGNOSIS — R000 Tachycardia, unspecified: Secondary | ICD-10-CM

## 2024-07-13 DIAGNOSIS — E119 Type 2 diabetes mellitus without complications: Secondary | ICD-10-CM | POA: Diagnosis not present

## 2024-07-13 DIAGNOSIS — Z794 Long term (current) use of insulin: Secondary | ICD-10-CM

## 2024-07-13 DIAGNOSIS — E1169 Type 2 diabetes mellitus with other specified complication: Secondary | ICD-10-CM

## 2024-07-13 DIAGNOSIS — I25118 Atherosclerotic heart disease of native coronary artery with other forms of angina pectoris: Secondary | ICD-10-CM

## 2024-07-13 DIAGNOSIS — N1832 Chronic kidney disease, stage 3b: Secondary | ICD-10-CM

## 2024-07-13 DIAGNOSIS — I739 Peripheral vascular disease, unspecified: Secondary | ICD-10-CM | POA: Diagnosis not present

## 2024-07-13 DIAGNOSIS — I152 Hypertension secondary to endocrine disorders: Secondary | ICD-10-CM

## 2024-07-13 DIAGNOSIS — E785 Hyperlipidemia, unspecified: Secondary | ICD-10-CM | POA: Diagnosis not present

## 2024-07-13 DIAGNOSIS — Z7984 Long term (current) use of oral hypoglycemic drugs: Secondary | ICD-10-CM

## 2024-07-13 NOTE — Patient Instructions (Signed)

## 2024-07-16 ENCOUNTER — Other Ambulatory Visit: Payer: Self-pay | Admitting: Nurse Practitioner

## 2024-07-16 DIAGNOSIS — E1165 Type 2 diabetes mellitus with hyperglycemia: Secondary | ICD-10-CM

## 2024-07-18 NOTE — Telephone Encounter (Signed)
 Too soon for refill.  Requested Prescriptions  Pending Prescriptions Disp Refills   omeprazole  (PRILOSEC ) 20 MG capsule [Pharmacy Med Name: OMEPRAZOLE  DR 20 MG Oral Capsule Delayed Release] 90 capsule 3    Sig: TAKE 1 CAPSULE EVERY DAY AS NEEDED (GERD SYMPTOMS)     Gastroenterology: Proton Pump Inhibitors Passed - 07/18/2024  8:49 AM      Passed - Valid encounter within last 12 months    Recent Outpatient Visits           3 weeks ago Diabetes mellitus treated with insulin  and oral medication (HCC)   [place] [facility], [facility]   2 months ago Hypertension associated with diabetes [facility])   [place] [facility] Melvin Pao, NP   4 months ago Stage 3b chronic kidney disease [facility])   [place] [facility] Melvin Pao, NP   7 months ago Hyperlipidemia associated with type 2 diabetes mellitus [facility])   [place] [facility] Melvin Pao, NP       Future Appointments             In 6 months Stoioff, Glendia BROCKS, MD [facility] [place]             potassium chloride  SA (KLOR-CON  M) 20 MEQ tablet [Pharmacy Med Name: POTASSIUM CHLORIDE  ER 20 MEQ Oral Tablet Extended Release] 90 tablet 3    Sig: TAKE 1 TABLET EVERY DAY     Endocrinology:  Minerals - Potassium Supplementation Failed - 07/18/2024  8:49 AM      Failed - Cr in normal range and within 360 days    Creatinine, Ser  Date Value Ref Range Status  06/22/2024 2.25 (H) 0.76 - 1.27 mg/dL Final         Passed - K in normal range and within 360 days    Potassium  Date Value Ref Range Status  06/22/2024 3.9 3.5 - 5.2 mmol/L Final         Passed - Valid encounter within last 12 months    Recent Outpatient Visits           3 weeks ago Diabetes mellitus treated with insulin  and oral medication (HCC)   [place] [facility] Melvin [facility]o, NP   2 months ago Hypertension associated with diabetes [facility])   [facility], NP   4 months ago Stage 3b chronic kidney disease [facility])   [place] [facility]o, NP   7 months ago Hyperlipidemia associated with type 2 diabetes mellitus [facility])   [place] [facility] Melvin Pao, NP       Future Appointments             In 6 months Stoioff, Glendia BROCKS, MD [facility] [place]             furosemide  (LASIX ) 40 MG tablet [Pharmacy Med Name: FUROSEMIDE  40 MG Oral Tablet] 90 tablet 3    Sig: TAKE 1 TABLET EVERY DAY     Cardiovascular:  Diuretics - Loop Failed - 07/18/2024  8:49 AM      Failed - Ca in normal range and within 180 days    Calcium   Date Value Ref Range Status  06/22/2024 10.7 (H) 8.6 - 10.2 mg/dL Final   Calcium , Ion  Date Value Ref Range Status  06/01/2020 1.31 1.15 - 1.40 mmol/L Final         Failed -  Cr in normal range and within 180 days    Creatinine, Ser  Date Value Ref Range Status  06/22/2024 2.25 (H) 0.76 - 1.27 mg/dL Final         Failed - Mg Level in normal range and within 180 days    Magnesium   Date Value Ref Range Status  10/10/2022 1.4 (L) 1.6 - 2.3 mg/dL Final         Passed - K in normal range and within 180 days    Potassium  Date Value Ref Range Status  06/22/2024 3.9 3.5 - 5.2 mmol/L Final         Passed - Na in normal range and within 180 days    Sodium  Date Value Ref Range Status  06/22/2024 140 134 - 144 mmol/L Final         Passed - Cl in normal range and within 180 days    Chloride  Date Value Ref Range Status  06/22/2024 99 96 - 106 mmol/L Final         Passed - Last BP in normal range    BP Readings from Last 1 Encounters:  07/13/24 136/70         Passed - Valid encounter within last 6 months    Recent Outpatient Visits           3 weeks ago Diabetes mellitus treated with insulin  and oral medication (HCC)   [place] [facility], NP   2 months  ago Hypertension associated with diabetes [facility])   [place] [facility] Melvin Pao, NP   4 months ago Stage 3b chronic kidney disease [facility])   [place], NP   7 months ago Hyperlipidemia associated with type 2 diabetes mellitus [facility])   [place] [facility] Melvin Pao, NP       Future Appointments             In 6 months Stoioff, Glendia BROCKS, MD [facility] [place]             doxazosin  (CARDURA ) 2 MG tablet [Pharmacy Med Name: DOXAZOSIN  MESYLATE 2 MG Oral Tablet] 90 tablet 3    Sig: TAKE 1 TABLET EVERY MORNING     Cardiovascular:  Alpha Blockers Passed - 07/18/2024  8:49 AM      Passed - Last BP in normal range    BP Readings from Last 1 Encounters:  07/13/24 136/70         Passed - Valid encounter within last 6 months    Recent Outpatient Visits           3 weeks ago Diabetes mellitus treated with insulin  and oral medication (HCC)   [place] [facility] Melvin Pao, NP   2 months ago Hypertension associated with diabetes [facility])   [place] [facility] Pao, NP   4 months ago Stage 3b chronic kidney disease [facility])   [place] [facility] Melvin Pao, NP   7 months ago Hyperlipidemia associated with type 2 diabetes mellitus [facility])   [place] [facility] Melvin Pao, NP       Future Appointments             In 6 months Stoioff, Glendia BROCKS, MD [facility] [place]             metFORMIN  (GLUCOPHAGE ) 500 MG tablet [Pharmacy Med Name: METFORMIN  HYDROCHLORIDE 500 MG  Oral Tablet] 360 tablet 3    Sig: TAKE 2 TABLETS TWICE DAILY     Endocrinology:  Diabetes - Biguanides Failed - 07/18/2024  8:49 AM      Failed - Cr in normal range and within 360 days    Creatinine, Ser  Date Value Ref Range Status  06/22/2024 2.25 (H) 0.76 - 1.27 mg/dL Final         Failed - eGFR in normal  range and within 360 days    GFR calc Af Amer  Date Value Ref Range Status  08/18/2020 62 >59 mL/min/1.73 Final    Comment:    **In accordance with recommendations from the NKF-ASN Task force,**   [facility] is in the process of updating its eGFR calculation to the   2021 CKD-EPI creatinine equation that estimates kidney function   without a race variable.    GFR, Estimated  Date Value Ref Range Status  09/28/2022 >60 >60 mL/min Final    Comment:    (NOTE) Calculated using the CKD-EPI Creatinine Equation (2021)    eGFR  Date Value Ref Range Status  06/22/2024 28 (L) >59 mL/min/1.73 Final         Failed - B12 Level in normal range and within 720 days    Vitamin B-12  Date Value Ref Range Status  09/27/2021 263 232 - 1,245 pg/mL Final         Failed - CBC within normal limits and completed in the last 12 months    WBC  Date Value Ref Range Status  10/10/2022 5.2 3.4 - 10.8 x10E3/uL Final  09/26/2022 6.0 4.0 - 10.5 K/uL Final   RBC  Date Value Ref Range Status  10/10/2022 4.12 (L) 4.14 - 5.80 x10E6/uL Final  09/26/2022 4.15 (L) 4.22 - 5.81 MIL/uL Final   Hemoglobin  Date Value Ref Range Status  10/10/2022 12.3 (L) 13.0 - 17.7 g/dL Final   Hematocrit  Date Value Ref Range Status  10/10/2022 37.6 37.5 - 51.0 % Final   MCHC  Date Value Ref Range Status  10/10/2022 32.7 31.5 - 35.7 g/dL Final  98/75/7975 64.7 30.0 - 36.0 g/dL Final   [facility]  Date Value Ref Range Status  10/10/2022 29.9 26.6 - 33.0 pg Final  09/26/2022 30.6 26.0 - 34.0 pg Final   MCV  Date Value Ref Range Status  10/10/2022 91 79 - 97 fL Final   No results found for: PLTCOUNTKUC, LABPLAT, POCPLA RDW  Date Value Ref Range Status  10/10/2022 13.1 11.6 - 15.4 % Final         Passed - HBA1C is between 0 and 7.9 and within 180 days    HB A1C (BAYER DCA - WAIVED)  Date Value Ref Range Status  09/27/2021 7.4 (H) 4.8 - 5.6 % Final    Comment:             Prediabetes: 5.7 - 6.4           Diabetes: >6.4          Glycemic control for adults with diabetes: <7.0    Hgb A1c MFr Bld  Date Value Ref Range Status  06/22/2024 6.1 (H) 4.8 - 5.6 % Final    Comment:             Prediabetes: 5.7 - 6.4          Diabetes: >6.4          Glycemic control for adults with diabetes: <7.0  Passed - Valid encounter within last 6 months    Recent Outpatient Visits           3 weeks ago Diabetes mellitus treated with insulin  and oral medication [facility])   [place] [facility] Mel[facility] Pao, NP   2 months ago Hypertension associated with diabetes [facility])   [place] [facility] Melvin Pao, NP   4 months ago Stage 3b chronic kidney disease [facility])   [place] [facility] Melvin Pao, NP   7 months ago Hyperlipidemia associated with type 2 diabetes mellitus [facility])   [place] [facility] Melvin Pao, NP       Future Appointments             In 6 months Stoioff, Glendia BROCKS, MD [facility]

## 2024-07-20 MED ORDER — OMEPRAZOLE MAGNESIUM 20 MG PO TBEC: 20.0000 mg | DELAYED_RELEASE_TABLET | Freq: Every day | ORAL | 1 refills | Status: AC | PRN

## 2024-07-20 MED ORDER — DOXAZOSIN MESYLATE 2 MG PO TABS: 2.0000 mg | ORAL_TABLET | Freq: Every morning | ORAL | 1 refills | Status: AC

## 2024-07-20 MED ORDER — METFORMIN HCL 500 MG PO TABS: 1000.0000 mg | ORAL_TABLET | Freq: Two times a day (BID) | ORAL | 1 refills | Status: AC

## 2024-07-20 MED ORDER — FUROSEMIDE 40 MG PO TABS: 40.0000 mg | ORAL_TABLET | Freq: Every day | ORAL | 1 refills | Status: AC

## 2024-07-20 MED ORDER — POTASSIUM CHLORIDE CRYS ER 20 MEQ PO TBCR: 20.0000 meq | EXTENDED_RELEASE_TABLET | Freq: Every day | ORAL | 1 refills | Status: AC

## 2024-07-20 NOTE — Addendum Note (Signed)
 Addended by: MELVIN PAO on: 07/20/2024 10:46 AM   Modules accepted: Orders

## 2024-07-24 ENCOUNTER — Encounter: Payer: Self-pay | Admitting: Nurse Practitioner

## 2024-07-24 ENCOUNTER — Other Ambulatory Visit: Payer: Self-pay | Admitting: Nurse Practitioner

## 2024-07-24 ENCOUNTER — Ambulatory Visit: Admitting: Nurse Practitioner

## 2024-07-24 VITALS — BP 126/78 | HR 87 | Temp 97.4°F | Ht 70.0 in | Wt 172.0 lb

## 2024-07-24 DIAGNOSIS — R000 Tachycardia, unspecified: Secondary | ICD-10-CM

## 2024-07-24 DIAGNOSIS — G479 Sleep disorder, unspecified: Secondary | ICD-10-CM

## 2024-07-24 DIAGNOSIS — E1165 Type 2 diabetes mellitus with hyperglycemia: Secondary | ICD-10-CM

## 2024-07-24 MED ORDER — MIRTAZAPINE 15 MG PO TABS: 15.0000 mg | ORAL_TABLET | Freq: Every day | ORAL | Status: AC

## 2024-07-24 NOTE — Progress Notes (Signed)
 BP 126/78   Pulse 87   Temp (!) 97.4 F (36.3 C) (Oral)   Ht 5' 10 (1.778 m)   Wt 172 lb (78 kg)   SpO2 98%   BMI 24.68 kg/m    Subjective:    Patient ID: Velma DELENA Favors, male    DOB: 09-29-1940, 83 y.o.   MRN: [id number]  HPI: CARLE FENECH is a 83 y.o. male  Chief Complaint  Patient presents with   Atrial Fibrillation    Return for Tachycardia follow-up   Insomnia    Patient states he is not sleeping much   Patient presents to clinic to follow up on tachycardia.  He saw Cardiology and rate was controlled in NSR.   Denies HA, CP, SOB, dizziness, palpitations, visual changes, and lower extremity swelling.   Patient states he is having trouble sleeping.  He believes he is taking the mirtazipine but not 100% sure.  States he has trouble falling asleep. He will lay there for a couple of hours sometimes.  He does endorse napping during the day.    Relevant past medical, surgical, family and social history reviewed and updated as indicated. Interim medical history since our last visit reviewed. Allergies and medications reviewed and updated.  Review of Systems  Eyes:  Negative for visual disturbance.  Respiratory:  Negative for shortness of breath.   Cardiovascular:  Negative for chest pain and leg swelling.  Neurological:  Negative for light-headedness and headaches.  Psychiatric/Behavioral:  Positive for sleep disturbance.     Per HPI unless specifically indicated above     Objective:    BP 126/78   Pulse 87   Temp (!) 97.4 F (36.3 C) (Oral)   Ht 5' 10 (1.778 m)   Wt 172 lb (78 kg)   SpO2 98%   BMI 24.68 kg/m   Wt Readings from Last 3 Encounters:  07/24/24 172 lb (78 kg)  07/13/24 170 lb (77.1 kg)  06/22/24 168 lb 12.8 oz (76.6 kg)    Physical Exam Vitals and nursing note reviewed.  Constitutional:      General: He is not in acute distress.    Appearance: Normal appearance. He is not ill-appearing, toxic-appearing or diaphoretic.  HENT:     Head:  Normocephalic.     Right Ear: External ear normal.     Left Ear: External ear normal.     Nose: Nose normal. No congestion or rhinorrhea.     Mouth/Throat:     Mouth: Mucous membranes are moist.  Eyes:     General:        Right eye: No discharge.        Left eye: No discharge.     Extraocular Movements: Extraocular movements intact.     Conjunctiva/sclera: Conjunctivae normal.     Pupils: Pupils are equal, round, and reactive to light.  Cardiovascular:     Rate and Rhythm: Normal rate and regular rhythm.     Heart sounds: No murmur heard. Pulmonary:     Effort: Pulmonary effort is normal. No respiratory distress.     Breath sounds: Normal breath sounds. No wheezing, rhonchi or rales.  Abdominal:     General: Abdomen is flat. Bowel sounds are normal.  Musculoskeletal:     Cervical back: Normal range of motion and neck supple.  Skin:    General: Skin is warm and dry.     Capillary Refill: Capillary refill takes less than 2 seconds.  Neurological:  General: No focal deficit present.     Mental Status: He is alert and oriented to person, place, and time.  Psychiatric:        Mood and Affect: Mood normal.        Behavior: Behavior normal.        Thought Content: Thought content normal.        Judgment: Judgment normal.     Results for orders placed or performed in visit on 06/22/24  Comprehensive metabolic panel with GFR   Collection Time: 06/22/24 11:35 AM  Result Value Ref Range   Glucose 189 (H) 70 - 99 mg/dL   BUN 24 8 - 27 mg/dL   Creatinine, Ser 7.74 (H) 0.76 - 1.27 mg/dL   eGFR 28 (L) >40 fO/fpw/8.26   BUN/Creatinine Ratio 11 10 - 24   Sodium 140 134 - 144 mmol/L   Potassium 3.9 3.5 - 5.2 mmol/L   Chloride 99 96 - 106 mmol/L   CO2 23 20 - 29 mmol/L   Calcium  10.7 (H) 8.6 - 10.2 mg/dL   Total Protein 6.5 6.0 - 8.5 g/dL   Albumin 4.4 3.7 - 4.7 g/dL   Globulin, Total 2.1 1.5 - 4.5 g/dL   Bilirubin Total 0.5 0.0 - 1.2 mg/dL   Alkaline Phosphatase 87 48 - 129  IU/L   AST 19 0 - 40 IU/L   ALT 7 0 - 44 IU/L  Hemoglobin A1c   Collection Time: 06/22/24 11:35 AM  Result Value Ref Range   Hgb A1c MFr Bld 6.1 (H) 4.8 - 5.6 %   Est. average glucose Bld gHb Est-mCnc 128 mg/dL  Lipid panel   Collection Time: 06/22/24 11:35 AM  Result Value Ref Range   Cholesterol, Total 130 100 - 199 mg/dL   Triglycerides 88 0 - 149 mg/dL   HDL 59 >60 mg/dL   VLDL Cholesterol Cal 17 5 - 40 mg/dL   LDL Chol Calc (NIH) 54 0 - 99 mg/dL   Chol/HDL Ratio 2.2 0.0 - 5.0 ratio      Assessment & Plan:   Problem List Items Addressed This Visit   None Visit Diagnoses       Tachycardia    -  Primary   Controlled with Metoprolol  25mg  daily.  Has seen cardiolgoy.  Note reviewed.  Follow up in 3 months.  Call sooner if concerns arise.     Difficulty sleeping       Mirtazipine increased to 15mg .  Recommend bringing medications to next visit so we can review them together.        Follow up plan: Return in about 3 months (around 10/24/2024) for HTN, HLD, DM2 FU.

## 2024-07-26 NOTE — Telephone Encounter (Signed)
 Rx 02/24/24 #90 1RF- too soon Requested Prescriptions  Pending Prescriptions Disp Refills   pravastatin  (PRAVACHOL ) 40 MG tablet [Pharmacy Med Name: PRAVASTATIN  SODIUM 40 MG Oral Tablet] 90 tablet 3    Sig: TAKE 1 TABLET AT BEDTIME     Cardiovascular:  Antilipid - Statins Failed - 07/26/2024 11:26 AM      Failed - Lipid Panel in normal range within the last 12 months    Cholesterol, Total  Date Value Ref Range Status  06/22/2024 130 100 - 199 mg/dL Final   Cholesterol Piccolo, Waived  Date Value Ref Range Status  07/02/2018 143 <200 mg/dL Final    Comment:                            Desirable                <200                         Borderline High      200- 239                         High                     >239    LDL Chol Calc (NIH)  Date Value Ref Range Status  06/22/2024 54 0 - 99 mg/dL Final   HDL  Date Value Ref Range Status  06/22/2024 59 >39 mg/dL Final   Triglycerides  Date Value Ref Range Status  06/22/2024 88 0 - 149 mg/dL Final   Triglycerides Piccolo,Waived  Date Value Ref Range Status  07/02/2018 124 <150 mg/dL Final    Comment:                            Normal                   <150                         Borderline High     150 - 199                         High                200 - 499                         Very High                >499          Passed - Patient is not pregnant      Passed - Valid encounter within last 12 months    Recent Outpatient Visits           2 days ago Tachycardia   [place] [facility] Melvin Pao, NP   1 month ago Diabetes mellitus treated with insulin  and oral medication [facility])   [place] [facility] Pao, NP   2 months ago Hypertension associated with diabetes [facility])   [place] [facility] Melvin Pao, NP   4 months ago Stage 3b chronic kidney disease [facility])   [place] [facility] Melvin Pao, NP   7 months  ago  Hyperlipidemia associated with type 2 diabetes mellitus [facility])   [place] [facility] Melvin Pao, NP       Future Appointments             In 5 months Stoioff, Glendia BROCKS, MD [facility]

## 2024-09-01 ENCOUNTER — Ambulatory Visit: Admitting: Emergency Medicine

## 2024-09-01 VITALS — Ht 70.0 in | Wt 175.0 lb

## 2024-09-01 DIAGNOSIS — Z0000 Encounter for general adult medical examination without abnormal findings: Secondary | ICD-10-CM

## 2024-09-01 NOTE — Patient Instructions (Signed)
 [name],  Thank you for taking the time for your Medicare Wellness Visit. I appreciate your continued commitment to your health goals. Please review the care plan we discussed, and feel free to reach out if I can assist you further.  Please note that Annual Wellness Visits do not include a physical exam. Some assessments may be limited, especially if the visit was conducted virtually. If needed, we may recommend an in-person follow-up with your provider.  Ongoing Care Seeing your primary care provider every 3 to 6 months helps us  monitor your health and provide consistent, personalized care.   Referrals If a referral was made during today's visit and you haven't received any updates within two weeks, please contact the referred provider directly to check on the status.  Recommended Screenings:  Health Maintenance  Topic Date Due   Medicare Annual Wellness Visit  04/22/2024   COVID-19 Vaccine (4 - 2025-26 season) 05/04/2024   Zoster (Shingles) Vaccine (1 of 2) 09/22/2024*   Yearly kidney health urinalysis for diabetes  12/18/2024   Complete foot exam   12/18/2024   Hemoglobin A1C  12/21/2024   Eye exam for diabetics  02/04/2025   Yearly kidney function blood test for diabetes  06/22/2025   DTaP/Tdap/Td vaccine (3 - Tdap) 07/02/2028   Pneumococcal Vaccine for age over 53  Completed   Flu Shot  Completed   Meningitis B Vaccine  Aged Out   Colon Cancer Screening  Discontinued   Hepatitis C Screening  Discontinued  *Topic was postponed. The date shown is not the original due date.       09/01/2024    1:19 PM  Advanced Directives  Does Patient Have a Medical Advance Directive? No  Would patient like information on creating a medical advance directive? Yes (MAU/Ambulatory/Procedural Areas - Information given)    Vision: Annual vision screenings are recommended for early detection of glaucoma, cataracts, and diabetic retinopathy. These exams can also reveal signs of chronic  conditions such as diabetes and high blood pressure.  Dental: Annual dental screenings help detect early signs of oral cancer, gum disease, and other conditions linked to overall health, including heart disease and diabetes.  Please see the attached documents for additional preventive care recommendations.

## 2024-09-01 NOTE — Progress Notes (Signed)
 "  Chief Complaint  Patient presents with   Medicare Wellness     Subjective:   Ryan Kirby is a 83 y.o. male who presents for a Medicare Annual Wellness Visit.  Visit info / Clinical Intake: Medicare Wellness Visit Type:: Subsequent Annual Wellness Visit Persons participating in visit and providing information:: patient Medicare Wellness Visit Mode:: Telephone If telephone:: video declined Since this visit was completed virtually, some vitals may be partially provided or unavailable. Missing vitals are due to the limitations of the virtual format.: Documented vitals are patient reported If Telephone or Video please confirm:: I connected with patient using audio/video enable telemedicine. I verified patient identity with two identifiers, discussed telehealth limitations, and patient agreed to proceed. Patient Location:: home Provider Location:: clinic office Interpreter Needed?: No Pre-visit prep was completed: yes AWV questionnaire completed by patient prior to visit?: no Living arrangements:: (!) lives alone Patient's Overall Health Status Rating: good Typical amount of pain: none Does pain affect daily life?: no Are you currently prescribed opioids?: no  Dietary Habits and Nutritional Risks How many meals a day?: 2 Eats fruit and vegetables daily?: (!) no (sometimes) Most meals are obtained by: preparing own meals In the last 2 weeks, have you had any of the following?: none Diabetic:: (!) yes Any non-healing wounds?: no How often do you check your BS?: as needed Would you like to be referred to a Nutritionist or for Diabetic Management? : no  Functional Status Activities of Daily Living (to include ambulation/medication): Independent Ambulation: Independent with device- listed below Home Assistive Devices/Equipment: Other (Comment); Eyeglasses (walking stick if outside) Medication Administration: Needs assistance (comment) (daughter fills pill box) Is this a change  from baseline?: Pre-admission baseline Home Management (perform basic housework or laundry): Independent Manage your own finances?: yes Primary transportation is: driving Concerns about vision?: no *vision screening is required for WTM* Concerns about hearing?: no  Fall Screening Falls in the past year?: 1 Number of falls in past year: 0 Was there an injury with Fall?: 0 Fall Risk Category Calculator: 1 Patient Fall Risk Level: Low Fall Risk  Fall Risk Patient at Risk for Falls Due to: History of fall(s); Impaired balance/gait; Impaired mobility Fall risk Follow up: Falls evaluation completed; Education provided; Falls prevention discussed  Home and Transportation Safety: All rugs have non-skid backing?: yes All stairs or steps have railings?: yes Grab bars in the bathtub or shower?: yes Have non-skid surface in bathtub or shower?: yes Good home lighting?: yes Regular seat belt use?: yes Hospital stays in the last year:: no  Cognitive Assessment Difficulty concentrating, remembering, or making decisions? : no Will 6CIT or Mini Cog be Completed: yes What year is it?: 4 points (1975 (asked twice)) What month is it?: 0 points Give patient an address phrase to remember (5 components): [address] [place] About what time is it?: 0 points Count backwards from 20 to 1: 0 points Say the months of the year in reverse: 2 points (Missed Sept) Repeat the address phrase from earlier: 4 points 6 CIT Score: 10 points  Advance Directives (For Healthcare) Does Patient Have a Medical Advance Directive?: No Would patient like information on creating a medical advance directive?: Yes (MAU/Ambulatory/Procedural Areas - Information given)  Reviewed/Updated  Reviewed/Updated: Reviewed All (Medical, Surgical, Family, Medications, Allergies, Care Teams, Patient Goals)    Allergies (verified) Patient has no known allergies.   Current Medications (verified) Outpatient Encounter  Medications as of 09/01/2024  Medication Sig   acetaminophen  (TYLENOL )  500 MG tablet Take 500-1,000 mg by mouth every 6 (six) hours as needed for mild pain or moderate pain.   amLODipine  (NORVASC ) 10 MG tablet TAKE 1 TABLET EVERY DAY   aspirin  EC 81 MG tablet Take 1 tablet (81 mg total) by mouth daily. Swallow whole.   Blood Glucose Monitoring Suppl DEVI 1 each by Does not apply route in the morning, at noon, and at bedtime. May substitute to any manufacturer covered by patient's insurance.   doxazosin  (CARDURA ) 2 MG tablet Take 1 tablet (2 mg total) by mouth every morning.   furosemide  (LASIX ) 40 MG tablet Take 1 tablet (40 mg total) by mouth daily.   losartan  (COZAAR ) 50 MG tablet Take 1 tablet (50 mg total) by mouth daily.   Magnesium  Oxide -Mg Supplement (MAG-OXIDE) 200 MG TABS Take 1 tablet (200 mg total) by mouth daily.   metFORMIN  (GLUCOPHAGE ) 500 MG tablet Take 2 tablets (1,000 mg total) by mouth 2 (two) times daily.   metoprolol  succinate (TOPROL -XL) 25 MG 24 hr tablet Take 1 tablet (25 mg total) by mouth daily.   mirtazapine  (REMERON ) 15 MG tablet Take 1 tablet (15 mg total) by mouth at bedtime.   omeprazole  (PRILOSEC  OTC) 20 MG tablet Take 1 tablet (20 mg total) by mouth daily as needed (GERD symptoms).   potassium chloride  SA (KLOR-CON  M) 20 MEQ tablet Take 1 tablet (20 mEq total) by mouth daily.   pravastatin  (PRAVACHOL ) 40 MG tablet TAKE 1 TABLET AT BEDTIME   omeprazole  (PRILOSEC ) 20 MG capsule Take 20 mg by mouth daily.   No facility-administered encounter medications on file as of 09/01/2024.    History: Past Medical History:  Diagnosis Date   Acute metabolic encephalopathy 09/23/2022   AKI (acute kidney injury) 09/23/2022   Allergy    Arthritis    CAD (coronary artery disease)    Chronic kidney disease    pt is not on dialysis   Diabetes mellitus without complication (HCC)    GERD (gastroesophageal reflux disease)    Hypercalcemia 09/23/2022   Hyperlipidemia     Hypertension    Hypertensive urgency 09/23/2022   Hypokalemia 08/18/2020   Lactic acidosis 09/23/2022   Sinus bradycardia 09/24/2022   Past Surgical History:  Procedure Laterality Date   BOWEL RESECTION  2004   CATARACT EXTRACTION W/PHACO Right 12/24/2022   Procedure: CATARACT EXTRACTION PHACO AND INTRAOCULAR LENS PLACEMENT (IOC) RIGHT;  Surgeon: Myrna Adine Anes, MD;  Location: [facility];  Service: Ophthalmology;  Laterality: Right;  6.06 0:37.7   CATARACT EXTRACTION W/PHACO Left 01/07/2023   Procedure: CATARACT EXTRACTION PHACO AND INTRAOCULAR LENS PLACEMENT (IOC) LEFT;  Surgeon: Myrna Adine Anes, MD;  Location: [facility];  Service: Ophthalmology;  Laterality: Left;  5.63 0:36.7   CORONARY ARTERY BYPASS GRAFT     FINGER AMPUTATION Left 2011   5th   INGUINAL HERNIA REPAIR Right 06/01/2020   Procedure: HERNIA REPAIR INGUINAL ADULT, open;  Surgeon: Marolyn Nest, MD;  Location: [facility];  Service: General;  Laterality: Right;   KNEE ARTHROSCOPY Right 09/12/2015   Procedure: ARTHROSCOPY KNEE, LATERAL MENISECTOMY, CHONDROPLASTY;  Surgeon: Lynwood SHAUNNA Hue, MD;  Location: [facility];  Service: Orthopedics;  Laterality: Right;   WISDOM TOOTH EXTRACTION     Family History  Problem Relation Age of Onset   Arthritis Mother    Hypertension Mother    Breast cancer Mother    Arthritis Father    Hypertension Father    Colon cancer Father    Heart  disease Brother    Social History   Occupational History   Occupation: retired  Tobacco Use   Smoking status: Never   Smokeless tobacco: Never  Vaping Use   Vaping status: Never Used  Substance and Sexual Activity   Alcohol  use: No    Alcohol /week: 0.0 standard drinks of alcohol    Drug use: No   Sexual activity: Not Currently   Tobacco Counseling Counseling given: Not Answered  SDOH Screenings   Food Insecurity: No Food Insecurity (09/01/2024)  Housing: Low Risk (09/01/2024)  Transportation Needs: No  Transportation Needs (09/01/2024)  Utilities: Not At Risk (09/01/2024)  Alcohol  Screen: Low Risk (04/23/2023)  Depression (PHQ2-9): Low Risk (09/01/2024)  Recent Concern: Depression (PHQ2-9) - Medium Risk (07/24/2024)  Financial Resource Strain: Low Risk (04/23/2023)  Physical Activity: Inactive (09/01/2024)  Social Connections: Moderately Isolated (09/01/2024)  Stress: No Stress Concern Present (09/01/2024)  Tobacco Use: Low Risk (09/01/2024)  Health Literacy: Adequate Health Literacy (09/01/2024)   See flowsheets for full screening details  Depression Screen Depression Screening Exception Documentation Depression Screening Exception:: Patient refusal  PHQ 2 & 9 Depression Scale- Over the past 2 weeks, how often have you been bothered by any of the following problems? Little interest or pleasure in doing things: 0 Feeling down, depressed, or hopeless (PHQ Adolescent also includes...irritable): 0 PHQ-2 Total Score: 0 Trouble falling or staying asleep, or sleeping too much: 0 Feeling tired or having little energy: 0 Poor appetite or overeating (PHQ Adolescent also includes...weight loss): 0 Feeling bad about yourself - or that you are a failure or have let yourself or your family down: 0 Trouble concentrating on things, such as reading the newspaper or watching television (PHQ Adolescent also includes...like school work): 0 Moving or speaking so slowly that other people could have noticed. Or the opposite - being so fidgety or restless that you have been moving around a lot more than usual: 0 Thoughts that you would be better off dead, or of hurting yourself in some way: 0 PHQ-9 Total Score: 0 If you checked off any problems, how difficult have these problems made it for you to do your work, take care of things at home, or get along with other people?: Not difficult at all  Depression Treatment Depression Interventions/Treatment : [id number] Score <4 Follow-up Not Indicated     Goals  Addressed               This Visit's Progress     DIET - INCREASE WATER INTAKE (pt-stated)               Objective:    Today's Vitals   09/01/24 1316  Weight: 175 lb (79.4 kg)  Height: 5' 10 (1.778 m)   Body mass index is 25.11 kg/m.  Hearing/Vision screen Hearing Screening - Comments:: Denies hearing loss  Vision Screening - Comments:: UTD w/ Dr. Adine Novak [facility] Immunizations and Health Maintenance Health Maintenance  Topic Date Due   COVID-19 Vaccine (4 - 2025-26 season) 05/04/2024   Zoster Vaccines- Shingrix (1 of 2) 09/22/2024 (Originally 10/08/1959)   Diabetic kidney evaluation - Urine ACR  12/18/2024   FOOT EXAM  12/18/2024   HEMOGLOBIN A1C  12/21/2024   OPHTHALMOLOGY EXAM  02/04/2025   Diabetic kidney evaluation - eGFR measurement  06/22/2025   Medicare Annual Wellness (AWV)  09/01/2025   DTaP/Tdap/Td (3 - Tdap) 07/02/2028   Pneumococcal Vaccine: 50+ Years  Completed   Influenza Vaccine  Completed   Meningococcal B Vaccine  Aged Out  Colonoscopy  Discontinued   Hepatitis C Screening  Discontinued        Assessment/Plan:  This is a routine wellness examination for [place].  Patient Care Team: Melvin Pao, NP as PCP - General (Nurse Practitioner) Myrna Adine Anes, MD as Consulting Physician (Ophthalmology) Hyler, Vernell DEL, NP as Nurse Practitioner (Nephrology) Perla Evalene PARAS, MD as Consulting Physician (Cardiology) Twylla Glendia BROCKS, MD (Urology) Marcelino Gales, MD (Nephrology)  I have personally reviewed and noted the following in the patients chart:   Medical and social history Use of alcohol , tobacco or illicit drugs  Current medications and supplements including opioid prescriptions. Functional ability and status Nutritional status Physical activity Advanced directives List of other physicians Hospitalizations, surgeries, and ER visits in previous 12 months Vitals Screenings to include cognitive, depression, and  falls Referrals and appointments  No orders of the defined types were placed in this encounter.  In addition, I have reviewed and discussed with patient certain preventive protocols, quality metrics, and best practice recommendations. A written personalized care plan for preventive services as well as general preventive health recommendations were provided to patient.   Vina Ned, CMA   09/01/2024   Return in 53 weeks (on 09/07/2025) for Medicare Annual Wellness Visit.  After Visit Summary: (Mail) Due to this being a telephonic visit, the after visit summary with patients personalized plan was offered to patient via mail   Nurse Notes:  6 CIT Score - 10 Declined Covid and Shingles vaccines Declined DM & Nutrition education referral Screening colonoscopy no longer recommended due to age. "

## 2024-10-26 ENCOUNTER — Ambulatory Visit: Admitting: Nurse Practitioner

## 2025-01-21 ENCOUNTER — Ambulatory Visit: Admitting: Urology

## 2025-09-07 ENCOUNTER — Ambulatory Visit
